# Patient Record
Sex: Male | Born: 1950
Health system: Southern US, Community
[De-identification: ages and names within clinical notes are randomized; demographics above are authoritative.]

## PROBLEM LIST (undated history)

## (undated) DIAGNOSIS — I499 Cardiac arrhythmia, unspecified: Secondary | ICD-10-CM

## (undated) DIAGNOSIS — R600 Localized edema: Secondary | ICD-10-CM

## (undated) DIAGNOSIS — Z9221 Personal history of antineoplastic chemotherapy: Secondary | ICD-10-CM

## (undated) DIAGNOSIS — I10 Essential (primary) hypertension: Secondary | ICD-10-CM

## (undated) DIAGNOSIS — Z720 Tobacco use: Secondary | ICD-10-CM

## (undated) DIAGNOSIS — C189 Malignant neoplasm of colon, unspecified: Secondary | ICD-10-CM

## (undated) DIAGNOSIS — I82409 Acute embolism and thrombosis of unspecified deep veins of unspecified lower extremity: Secondary | ICD-10-CM

## (undated) DIAGNOSIS — G709 Myoneural disorder, unspecified: Secondary | ICD-10-CM

## (undated) DIAGNOSIS — I480 Paroxysmal atrial fibrillation: Secondary | ICD-10-CM

## (undated) HISTORY — DX: Essential (primary) hypertension: I10

## (undated) HISTORY — DX: Acute embolism and thrombosis of unspecified deep veins of unspecified lower extremity: I82.409

## (undated) HISTORY — PX: TUNNELED VENOUS PORT PLACEMENT: SHX819

## (undated) HISTORY — PX: COLON SURGERY: SHX602

## (undated) HISTORY — PX: FRACTURE SURGERY: SHX138

## (undated) HISTORY — PX: PORT A CATH INJECTION (ARMC HX): HXRAD1731

## (undated) HISTORY — PX: LEG SURGERY: SHX1003

## (undated) HISTORY — DX: Malignant neoplasm of colon, unspecified: C18.9

## (undated) HISTORY — PX: FEMUR FRACTURE SURGERY: SHX633

---

## 2013-11-23 ENCOUNTER — Ambulatory Visit: Payer: Self-pay | Admitting: Oncology

## 2013-12-16 ENCOUNTER — Inpatient Hospital Stay: Payer: Self-pay | Admitting: Surgery

## 2013-12-16 LAB — COMPREHENSIVE METABOLIC PANEL
ANION GAP: 9 (ref 7–16)
Albumin: 3.8 g/dL (ref 3.4–5.0)
Alkaline Phosphatase: 81 U/L
BUN: 16 mg/dL (ref 7–18)
Bilirubin,Total: 1.4 mg/dL — ABNORMAL HIGH (ref 0.2–1.0)
CHLORIDE: 105 mmol/L (ref 98–107)
CO2: 25 mmol/L (ref 21–32)
Calcium, Total: 9.1 mg/dL (ref 8.5–10.1)
Creatinine: 1.47 mg/dL — ABNORMAL HIGH (ref 0.60–1.30)
GFR CALC NON AF AMER: 52 — AB
GLUCOSE: 123 mg/dL — AB (ref 65–99)
Osmolality: 280 (ref 275–301)
Potassium: 3.5 mmol/L (ref 3.5–5.1)
SGOT(AST): 20 U/L (ref 15–37)
SGPT (ALT): 24 U/L
SODIUM: 139 mmol/L (ref 136–145)
Total Protein: 8 g/dL (ref 6.4–8.2)

## 2013-12-16 LAB — URINALYSIS, COMPLETE
Bacteria: NONE SEEN
Bilirubin,UR: NEGATIVE
Glucose,UR: NEGATIVE mg/dL (ref 0–75)
LEUKOCYTE ESTERASE: NEGATIVE
NITRITE: NEGATIVE
Ph: 5 (ref 4.5–8.0)
Protein: NEGATIVE
RBC,UR: 2 /HPF (ref 0–5)
SQUAMOUS EPITHELIAL: NONE SEEN
Specific Gravity: 1.021 (ref 1.003–1.030)

## 2013-12-16 LAB — LIPASE, BLOOD: Lipase: 91 U/L (ref 73–393)

## 2013-12-16 LAB — CBC
HCT: 51.6 % (ref 40.0–52.0)
HGB: 16.9 g/dL (ref 13.0–18.0)
MCH: 28.9 pg (ref 26.0–34.0)
MCHC: 32.7 g/dL (ref 32.0–36.0)
MCV: 89 fL (ref 80–100)
PLATELETS: 262 10*3/uL (ref 150–440)
RBC: 5.83 10*6/uL (ref 4.40–5.90)
RDW: 14.4 % (ref 11.5–14.5)
WBC: 8.1 10*3/uL (ref 3.8–10.6)

## 2013-12-16 LAB — TROPONIN I: Troponin-I: 0.04 ng/mL

## 2013-12-17 LAB — BASIC METABOLIC PANEL
Anion Gap: 8 (ref 7–16)
BUN: 16 mg/dL (ref 7–18)
CALCIUM: 8.3 mg/dL — AB (ref 8.5–10.1)
CREATININE: 1.43 mg/dL — AB (ref 0.60–1.30)
Chloride: 102 mmol/L (ref 98–107)
Co2: 25 mmol/L (ref 21–32)
EGFR (African American): >60
GFR CALC NON AF AMER: 53 — AB
Glucose: 116 mg/dL — ABNORMAL HIGH (ref 65–99)
Osmolality: 272 (ref 275–301)
Potassium: 3.4 mmol/L — ABNORMAL LOW (ref 3.5–5.1)
Sodium: 135 mmol/L — ABNORMAL LOW (ref 136–145)

## 2013-12-17 LAB — CBC WITH DIFFERENTIAL/PLATELET
Basophil #: 0.1 10*3/uL (ref 0.0–0.1)
Basophil %: 0.6 %
EOS ABS: 0 10*3/uL (ref 0.0–0.7)
Eosinophil %: 0.3 %
HCT: 43.9 % (ref 40.0–52.0)
HGB: 14.5 g/dL (ref 13.0–18.0)
Lymphocyte #: 1.4 10*3/uL (ref 1.0–3.6)
Lymphocyte %: 16.4 %
MCH: 29 pg (ref 26.0–34.0)
MCHC: 33 g/dL (ref 32.0–36.0)
MCV: 88 fL (ref 80–100)
Monocyte #: 0.6 x10 3/mm (ref 0.2–1.0)
Monocyte %: 7.1 %
NEUTROS ABS: 6.6 10*3/uL — AB (ref 1.4–6.5)
Neutrophil %: 75.6 %
Platelet: 208 10*3/uL (ref 150–440)
RBC: 4.99 10*6/uL (ref 4.40–5.90)
RDW: 14.3 % (ref 11.5–14.5)
WBC: 8.7 10*3/uL (ref 3.8–10.6)

## 2013-12-17 LAB — CEA: CEA: 69.5 ng/mL — AB (ref 0.0–4.7)

## 2013-12-19 HISTORY — PX: COLON RESECTION: SHX5231

## 2013-12-19 LAB — CBC WITH DIFFERENTIAL/PLATELET
Basophil #: 0 10*3/uL (ref 0.0–0.1)
Basophil %: 0.4 %
Eosinophil #: 0.1 10*3/uL (ref 0.0–0.7)
Eosinophil %: 1.9 %
HCT: 39.4 % — ABNORMAL LOW (ref 40.0–52.0)
HGB: 13 g/dL (ref 13.0–18.0)
Lymphocyte #: 1.1 10*3/uL (ref 1.0–3.6)
Lymphocyte %: 21.3 %
MCH: 29.3 pg (ref 26.0–34.0)
MCHC: 33 g/dL (ref 32.0–36.0)
MCV: 89 fL (ref 80–100)
Monocyte #: 0.5 x10 3/mm (ref 0.2–1.0)
Monocyte %: 9.9 %
Neutrophil #: 3.5 10*3/uL (ref 1.4–6.5)
Neutrophil %: 66.5 %
Platelet: 168 10*3/uL (ref 150–440)
RBC: 4.43 10*6/uL (ref 4.40–5.90)
RDW: 14.6 % — ABNORMAL HIGH (ref 11.5–14.5)
WBC: 5.3 10*3/uL (ref 3.8–10.6)

## 2013-12-19 LAB — BASIC METABOLIC PANEL
Anion Gap: 6 — ABNORMAL LOW (ref 7–16)
BUN: 14 mg/dL (ref 7–18)
CO2: 25 mmol/L (ref 21–32)
CREATININE: 1.3 mg/dL (ref 0.60–1.30)
Calcium, Total: 8.5 mg/dL (ref 8.5–10.1)
Chloride: 105 mmol/L (ref 98–107)
EGFR (African American): >60
GFR CALC NON AF AMER: 59 — AB
GLUCOSE: 96 mg/dL (ref 65–99)
OSMOLALITY: 272 (ref 275–301)
Potassium: 3.8 mmol/L (ref 3.5–5.1)
Sodium: 136 mmol/L (ref 136–145)

## 2013-12-19 LAB — PROTIME-INR
INR: 1.2
Prothrombin Time: 14.7 secs (ref 11.5–14.7)

## 2013-12-20 LAB — CBC WITH DIFFERENTIAL/PLATELET
Basophil #: 0 10*3/uL (ref 0.0–0.1)
Basophil %: 0.5 %
Eosinophil #: 0 10*3/uL (ref 0.0–0.7)
Eosinophil %: 0.5 %
HCT: 42.6 % (ref 40.0–52.0)
HGB: 14.1 g/dL (ref 13.0–18.0)
LYMPHS ABS: 0.5 10*3/uL — AB (ref 1.0–3.6)
Lymphocyte %: 15.9 %
MCH: 28.8 pg (ref 26.0–34.0)
MCHC: 33 g/dL (ref 32.0–36.0)
MCV: 88 fL (ref 80–100)
MONO ABS: 0.4 x10 3/mm (ref 0.2–1.0)
MONOS PCT: 10.6 %
NEUTROS PCT: 72.5 %
Neutrophil #: 2.4 10*3/uL (ref 1.4–6.5)
Platelet: 179 10*3/uL (ref 150–440)
RBC: 4.87 10*6/uL (ref 4.40–5.90)
RDW: 14.7 % — ABNORMAL HIGH (ref 11.5–14.5)
WBC: 3.3 10*3/uL — ABNORMAL LOW (ref 3.8–10.6)

## 2013-12-20 LAB — COMPREHENSIVE METABOLIC PANEL
ALK PHOS: 57 U/L
Albumin: 2.3 g/dL — ABNORMAL LOW (ref 3.4–5.0)
Anion Gap: 8 (ref 7–16)
BILIRUBIN TOTAL: 2.7 mg/dL — AB (ref 0.2–1.0)
BUN: 20 mg/dL — AB (ref 7–18)
CHLORIDE: 105 mmol/L (ref 98–107)
Calcium, Total: 8.4 mg/dL — ABNORMAL LOW (ref 8.5–10.1)
Co2: 24 mmol/L (ref 21–32)
Creatinine: 1.57 mg/dL — ABNORMAL HIGH (ref 0.60–1.30)
EGFR (African American): 58 — ABNORMAL LOW
EGFR (Non-African Amer.): 48 — ABNORMAL LOW
Glucose: 117 mg/dL — ABNORMAL HIGH (ref 65–99)
Osmolality: 277 (ref 275–301)
Potassium: 4.1 mmol/L (ref 3.5–5.1)
SGOT(AST): 13 U/L — ABNORMAL LOW (ref 15–37)
SGPT (ALT): 17 U/L
Sodium: 137 mmol/L (ref 136–145)
Total Protein: 5.7 g/dL — ABNORMAL LOW (ref 6.4–8.2)

## 2013-12-21 LAB — BASIC METABOLIC PANEL
Anion Gap: 9 (ref 7–16)
BUN: 20 mg/dL — ABNORMAL HIGH (ref 7–18)
CALCIUM: 9.1 mg/dL (ref 8.5–10.1)
CHLORIDE: 104 mmol/L (ref 98–107)
CREATININE: 1.15 mg/dL (ref 0.60–1.30)
Co2: 29 mmol/L (ref 21–32)
EGFR (African American): >60
EGFR (Non-African Amer.): >60
GLUCOSE: 114 mg/dL — AB (ref 65–99)
Osmolality: 287 (ref 275–301)
POTASSIUM: 3.7 mmol/L (ref 3.5–5.1)
SODIUM: 142 mmol/L (ref 136–145)

## 2013-12-21 LAB — CBC WITH DIFFERENTIAL/PLATELET
BASOS ABS: 0 10*3/uL (ref 0.0–0.1)
Basophil %: 0.2 %
EOS ABS: 0 10*3/uL (ref 0.0–0.7)
Eosinophil %: 0.6 %
HCT: 42.9 % (ref 40.0–52.0)
HGB: 14 g/dL (ref 13.0–18.0)
LYMPHS ABS: 0.6 10*3/uL — AB (ref 1.0–3.6)
Lymphocyte %: 11.1 %
MCH: 28.5 pg (ref 26.0–34.0)
MCHC: 32.6 g/dL (ref 32.0–36.0)
MCV: 87 fL (ref 80–100)
Monocyte #: 0.5 x10 3/mm (ref 0.2–1.0)
Monocyte %: 9.6 %
Neutrophil #: 4.4 10*3/uL (ref 1.4–6.5)
Neutrophil %: 78.5 %
PLATELETS: 217 10*3/uL (ref 150–440)
RBC: 4.9 10*6/uL (ref 4.40–5.90)
RDW: 15 % — ABNORMAL HIGH (ref 11.5–14.5)
WBC: 5.6 10*3/uL (ref 3.8–10.6)

## 2013-12-22 LAB — BASIC METABOLIC PANEL
Anion Gap: 8 (ref 7–16)
BUN: 30 mg/dL — AB (ref 7–18)
CHLORIDE: 104 mmol/L (ref 98–107)
CO2: 34 mmol/L — AB (ref 21–32)
CREATININE: 1.38 mg/dL — AB (ref 0.60–1.30)
Calcium, Total: 8.8 mg/dL (ref 8.5–10.1)
EGFR (African American): >60
EGFR (Non-African Amer.): 56 — ABNORMAL LOW
GLUCOSE: 109 mg/dL — AB (ref 65–99)
OSMOLALITY: 297 (ref 275–301)
Potassium: 3.4 mmol/L — ABNORMAL LOW (ref 3.5–5.1)
SODIUM: 146 mmol/L — AB (ref 136–145)

## 2013-12-22 LAB — MAGNESIUM: MAGNESIUM: 2.7 mg/dL — AB

## 2013-12-23 ENCOUNTER — Ambulatory Visit: Payer: Self-pay | Admitting: Oncology

## 2013-12-23 LAB — CBC WITH DIFFERENTIAL/PLATELET
BASOS ABS: 0 10*3/uL (ref 0.0–0.1)
Basophil %: 0.3 %
EOS PCT: 2.4 %
Eosinophil #: 0.2 10*3/uL (ref 0.0–0.7)
HCT: 36.4 % — ABNORMAL LOW (ref 40.0–52.0)
HGB: 11.6 g/dL — ABNORMAL LOW (ref 13.0–18.0)
Lymphocyte #: 1.2 10*3/uL (ref 1.0–3.6)
Lymphocyte %: 17.4 %
MCH: 28.4 pg (ref 26.0–34.0)
MCHC: 31.8 g/dL — AB (ref 32.0–36.0)
MCV: 89 fL (ref 80–100)
MONO ABS: 0.6 x10 3/mm (ref 0.2–1.0)
Monocyte %: 8.7 %
NEUTROS ABS: 5.1 10*3/uL (ref 1.4–6.5)
Neutrophil %: 71.2 %
PLATELETS: 208 10*3/uL (ref 150–440)
RBC: 4.07 10*6/uL — AB (ref 4.40–5.90)
RDW: 14.8 % — ABNORMAL HIGH (ref 11.5–14.5)
WBC: 7.2 10*3/uL (ref 3.8–10.6)

## 2013-12-23 LAB — BASIC METABOLIC PANEL
Anion Gap: 7 (ref 7–16)
BUN: 30 mg/dL — AB (ref 7–18)
CHLORIDE: 104 mmol/L (ref 98–107)
CO2: 33 mmol/L — AB (ref 21–32)
Calcium, Total: 8.1 mg/dL — ABNORMAL LOW (ref 8.5–10.1)
Creatinine: 1.51 mg/dL — ABNORMAL HIGH (ref 0.60–1.30)
EGFR (Non-African Amer.): 50 — ABNORMAL LOW
Glucose: 124 mg/dL — ABNORMAL HIGH (ref 65–99)
Osmolality: 294 (ref 275–301)
Potassium: 3.3 mmol/L — ABNORMAL LOW (ref 3.5–5.1)
Sodium: 144 mmol/L (ref 136–145)

## 2013-12-23 LAB — MAGNESIUM: Magnesium: 2.6 mg/dL — ABNORMAL HIGH

## 2013-12-23 LAB — PHOSPHORUS: PHOSPHORUS: 2.9 mg/dL (ref 2.5–4.9)

## 2013-12-24 LAB — CBC WITH DIFFERENTIAL/PLATELET
Basophil #: 0 10*3/uL (ref 0.0–0.1)
Basophil %: 0.4 %
EOS ABS: 0.3 10*3/uL (ref 0.0–0.7)
Eosinophil %: 4.1 %
HCT: 36.2 % — AB (ref 40.0–52.0)
HGB: 11.7 g/dL — AB (ref 13.0–18.0)
LYMPHS PCT: 22.2 %
Lymphocyte #: 1.5 10*3/uL (ref 1.0–3.6)
MCH: 28.8 pg (ref 26.0–34.0)
MCHC: 32.3 g/dL (ref 32.0–36.0)
MCV: 89 fL (ref 80–100)
MONO ABS: 0.6 x10 3/mm (ref 0.2–1.0)
MONOS PCT: 9.7 %
NEUTROS ABS: 4.2 10*3/uL (ref 1.4–6.5)
Neutrophil %: 63.6 %
Platelet: 239 10*3/uL (ref 150–440)
RBC: 4.06 10*6/uL — ABNORMAL LOW (ref 4.40–5.90)
RDW: 15.1 % — AB (ref 11.5–14.5)
WBC: 6.6 10*3/uL (ref 3.8–10.6)

## 2013-12-24 LAB — BASIC METABOLIC PANEL
ANION GAP: 5 — AB (ref 7–16)
BUN: 23 mg/dL — ABNORMAL HIGH (ref 7–18)
CREATININE: 1.5 mg/dL — AB (ref 0.60–1.30)
Calcium, Total: 8.8 mg/dL (ref 8.5–10.1)
Chloride: 102 mmol/L (ref 98–107)
Co2: 32 mmol/L (ref 21–32)
EGFR (African American): >60
GFR CALC NON AF AMER: 50 — AB
Glucose: 104 mg/dL — ABNORMAL HIGH (ref 65–99)
Osmolality: 282 (ref 275–301)
Potassium: 3.2 mmol/L — ABNORMAL LOW (ref 3.5–5.1)
SODIUM: 139 mmol/L (ref 136–145)

## 2013-12-25 LAB — BASIC METABOLIC PANEL
ANION GAP: 8 (ref 7–16)
BUN: 20 mg/dL — ABNORMAL HIGH (ref 7–18)
CALCIUM: 8.5 mg/dL (ref 8.5–10.1)
CHLORIDE: 102 mmol/L (ref 98–107)
CO2: 28 mmol/L (ref 21–32)
CREATININE: 1.39 mg/dL — AB (ref 0.60–1.30)
EGFR (Non-African Amer.): 55 — ABNORMAL LOW
Glucose: 107 mg/dL — ABNORMAL HIGH (ref 65–99)
Osmolality: 279 (ref 275–301)
POTASSIUM: 3.1 mmol/L — AB (ref 3.5–5.1)
SODIUM: 138 mmol/L (ref 136–145)

## 2013-12-25 LAB — CLOSTRIDIUM DIFFICILE(ARMC)

## 2013-12-26 LAB — BASIC METABOLIC PANEL
Anion Gap: 11 (ref 7–16)
BUN: 19 mg/dL — ABNORMAL HIGH (ref 7–18)
CHLORIDE: 106 mmol/L (ref 98–107)
CO2: 22 mmol/L (ref 21–32)
CREATININE: 1.37 mg/dL — AB (ref 0.60–1.30)
Calcium, Total: 8.5 mg/dL (ref 8.5–10.1)
EGFR (Non-African Amer.): 56 — ABNORMAL LOW
Glucose: 101 mg/dL — ABNORMAL HIGH (ref 65–99)
OSMOLALITY: 280 (ref 275–301)
POTASSIUM: 3.9 mmol/L (ref 3.5–5.1)
SODIUM: 139 mmol/L (ref 136–145)

## 2014-01-01 ENCOUNTER — Ambulatory Visit: Payer: Self-pay | Admitting: Surgery

## 2014-01-07 ENCOUNTER — Inpatient Hospital Stay: Payer: Self-pay | Admitting: Surgery

## 2014-01-08 LAB — CREATININE, SERUM
Creatinine: 1.28 mg/dL (ref 0.60–1.30)
EGFR (African American): >60
EGFR (Non-African Amer.): >60

## 2014-01-09 ENCOUNTER — Ambulatory Visit: Payer: Self-pay | Admitting: Oncology

## 2014-01-14 LAB — PROTIME-INR
INR: 1.3
Prothrombin Time: 15.8 secs — ABNORMAL HIGH (ref 11.5–14.7)

## 2014-01-20 LAB — CREATININE, SERUM
Creatinine: 1.4 mg/dL — ABNORMAL HIGH (ref 0.60–1.30)
EGFR (African American): >60
EGFR (Non-African Amer.): 55 — ABNORMAL LOW

## 2014-01-20 LAB — PROTIME-INR
INR: 4.8
Prothrombin Time: 43.5 secs — ABNORMAL HIGH (ref 11.5–14.7)

## 2014-01-23 ENCOUNTER — Ambulatory Visit: Payer: Self-pay | Admitting: Oncology

## 2014-01-27 LAB — PROTIME-INR
INR: 3.9
PROTHROMBIN TIME: 36.8 s — AB (ref 11.5–14.7)

## 2014-01-30 DIAGNOSIS — I4891 Unspecified atrial fibrillation: Secondary | ICD-10-CM | POA: Insufficient documentation

## 2014-01-30 DIAGNOSIS — I10 Essential (primary) hypertension: Secondary | ICD-10-CM | POA: Insufficient documentation

## 2014-01-30 LAB — CBC CANCER CENTER
BASOS ABS: 0 x10 3/mm (ref 0.0–0.1)
Basophil %: 0.2 %
EOS PCT: 6.5 %
Eosinophil #: 0.3 x10 3/mm (ref 0.0–0.7)
HCT: 37.6 % — ABNORMAL LOW (ref 40.0–52.0)
HGB: 12.1 g/dL — ABNORMAL LOW (ref 13.0–18.0)
LYMPHS ABS: 2 x10 3/mm (ref 1.0–3.6)
Lymphocyte %: 43 %
MCH: 27.3 pg (ref 26.0–34.0)
MCHC: 32.3 g/dL (ref 32.0–36.0)
MCV: 85 fL (ref 80–100)
MONO ABS: 0.4 x10 3/mm (ref 0.2–1.0)
Monocyte %: 9.2 %
NEUTROS ABS: 1.9 x10 3/mm (ref 1.4–6.5)
NEUTROS PCT: 41.1 %
PLATELETS: 227 x10 3/mm (ref 150–440)
RBC: 4.44 10*6/uL (ref 4.40–5.90)
RDW: 15.7 % — ABNORMAL HIGH (ref 11.5–14.5)
WBC: 4.6 x10 3/mm (ref 3.8–10.6)

## 2014-01-30 LAB — COMPREHENSIVE METABOLIC PANEL
ALBUMIN: 3.5 g/dL (ref 3.4–5.0)
ALK PHOS: 81 U/L
ANION GAP: 7 (ref 7–16)
AST: 10 U/L — AB (ref 15–37)
BUN: 18 mg/dL (ref 7–18)
Bilirubin,Total: 1.1 mg/dL — ABNORMAL HIGH (ref 0.2–1.0)
CALCIUM: 8.9 mg/dL (ref 8.5–10.1)
CO2: 30 mmol/L (ref 21–32)
Chloride: 103 mmol/L (ref 98–107)
Creatinine: 1.22 mg/dL (ref 0.60–1.30)
EGFR (African American): >60
EGFR (Non-African Amer.): >60
Glucose: 89 mg/dL (ref 65–99)
Osmolality: 281 (ref 275–301)
POTASSIUM: 4.3 mmol/L (ref 3.5–5.1)
SGPT (ALT): 28 U/L
SODIUM: 140 mmol/L (ref 136–145)
Total Protein: 7.1 g/dL (ref 6.4–8.2)

## 2014-01-30 LAB — PROTIME-INR
INR: 1.3
PROTHROMBIN TIME: 15.7 s — AB (ref 11.5–14.7)

## 2014-02-02 DIAGNOSIS — R001 Bradycardia, unspecified: Secondary | ICD-10-CM | POA: Insufficient documentation

## 2014-02-02 LAB — CEA: CEA: 3.3 ng/mL (ref 0.0–4.7)

## 2014-02-05 ENCOUNTER — Ambulatory Visit: Payer: Self-pay | Admitting: Surgery

## 2014-02-05 LAB — PROTIME-INR
INR: 1.1
Prothrombin Time: 13.7 secs (ref 11.5–14.7)

## 2014-02-06 LAB — CBC CANCER CENTER
BASOS PCT: 1.3 %
Basophil #: 0.1 x10 3/mm (ref 0.0–0.1)
Eosinophil #: 0.3 x10 3/mm (ref 0.0–0.7)
Eosinophil %: 6.8 %
HCT: 37.3 % — AB (ref 40.0–52.0)
HGB: 12 g/dL — AB (ref 13.0–18.0)
LYMPHS PCT: 43.2 %
Lymphocyte #: 1.8 x10 3/mm (ref 1.0–3.6)
MCH: 27.2 pg (ref 26.0–34.0)
MCHC: 32.1 g/dL (ref 32.0–36.0)
MCV: 85 fL (ref 80–100)
MONOS PCT: 8.6 %
Monocyte #: 0.4 x10 3/mm (ref 0.2–1.0)
NEUTROS ABS: 1.7 x10 3/mm (ref 1.4–6.5)
NEUTROS PCT: 40.1 %
Platelet: 181 x10 3/mm (ref 150–440)
RBC: 4.4 10*6/uL (ref 4.40–5.90)
RDW: 15.4 % — ABNORMAL HIGH (ref 11.5–14.5)
WBC: 4.2 x10 3/mm (ref 3.8–10.6)

## 2014-02-06 LAB — COMPREHENSIVE METABOLIC PANEL
ANION GAP: 6 — AB (ref 7–16)
Albumin: 3.4 g/dL (ref 3.4–5.0)
Alkaline Phosphatase: 66 U/L
BILIRUBIN TOTAL: 1.2 mg/dL — AB (ref 0.2–1.0)
BUN: 11 mg/dL (ref 7–18)
CHLORIDE: 106 mmol/L (ref 98–107)
CREATININE: 1.23 mg/dL (ref 0.60–1.30)
Calcium, Total: 8.6 mg/dL (ref 8.5–10.1)
Co2: 31 mmol/L (ref 21–32)
EGFR (Non-African Amer.): >60
Glucose: 89 mg/dL (ref 65–99)
Osmolality: 284 (ref 275–301)
Potassium: 4.1 mmol/L (ref 3.5–5.1)
SGOT(AST): 9 U/L — ABNORMAL LOW (ref 15–37)
SGPT (ALT): 20 U/L
Sodium: 143 mmol/L (ref 136–145)
TOTAL PROTEIN: 6.7 g/dL (ref 6.4–8.2)

## 2014-02-10 LAB — CEA: CEA: 2.9 ng/mL (ref 0.0–4.7)

## 2014-02-12 LAB — PROTIME-INR
INR: 1.2
PROTHROMBIN TIME: 15 s — AB (ref 11.5–14.7)

## 2014-02-19 LAB — PROTIME-INR
INR: 1.9
PROTHROMBIN TIME: 21.1 s — AB (ref 11.5–14.7)

## 2014-02-19 LAB — CBC CANCER CENTER
BASOS ABS: 0 x10 3/mm (ref 0.0–0.1)
Basophil %: 0.9 %
EOS PCT: 5.5 %
Eosinophil #: 0.2 x10 3/mm (ref 0.0–0.7)
HCT: 36.2 % — ABNORMAL LOW (ref 40.0–52.0)
HGB: 12 g/dL — ABNORMAL LOW (ref 13.0–18.0)
Lymphocyte #: 1.5 x10 3/mm (ref 1.0–3.6)
Lymphocyte %: 43.9 %
MCH: 27.7 pg (ref 26.0–34.0)
MCHC: 33.2 g/dL (ref 32.0–36.0)
MCV: 84 fL (ref 80–100)
MONOS PCT: 9.7 %
Monocyte #: 0.3 x10 3/mm (ref 0.2–1.0)
NEUTROS PCT: 40 %
Neutrophil #: 1.4 x10 3/mm (ref 1.4–6.5)
PLATELETS: 195 x10 3/mm (ref 150–440)
RBC: 4.33 10*6/uL — AB (ref 4.40–5.90)
RDW: 16 % — AB (ref 11.5–14.5)
WBC: 3.5 x10 3/mm — AB (ref 3.8–10.6)

## 2014-02-19 LAB — BASIC METABOLIC PANEL
Anion Gap: 7 (ref 7–16)
BUN: 13 mg/dL (ref 7–18)
CALCIUM: 8.6 mg/dL (ref 8.5–10.1)
CREATININE: 1.11 mg/dL (ref 0.60–1.30)
Chloride: 107 mmol/L (ref 98–107)
Co2: 29 mmol/L (ref 21–32)
EGFR (Non-African Amer.): >60
Glucose: 101 mg/dL — ABNORMAL HIGH (ref 65–99)
Osmolality: 285 (ref 275–301)
Potassium: 3.7 mmol/L (ref 3.5–5.1)
SODIUM: 143 mmol/L (ref 136–145)

## 2014-02-23 ENCOUNTER — Ambulatory Visit: Payer: Self-pay | Admitting: Oncology

## 2014-02-24 LAB — COMPREHENSIVE METABOLIC PANEL
ALK PHOS: 66 U/L (ref 46–116)
Albumin: 3.5 g/dL (ref 3.4–5.0)
Anion Gap: 10 (ref 7–16)
BUN: 13 mg/dL (ref 7–18)
Bilirubin,Total: 0.5 mg/dL (ref 0.2–1.0)
CHLORIDE: 106 mmol/L (ref 98–107)
CREATININE: 1.23 mg/dL (ref 0.60–1.30)
Calcium, Total: 8.8 mg/dL (ref 8.5–10.1)
Co2: 26 mmol/L (ref 21–32)
EGFR (African American): >60
EGFR (Non-African Amer.): >60
Glucose: 133 mg/dL — ABNORMAL HIGH (ref 65–99)
Osmolality: 285 (ref 275–301)
Potassium: 3.6 mmol/L (ref 3.5–5.1)
SGOT(AST): 14 U/L — ABNORMAL LOW (ref 15–37)
SGPT (ALT): 23 U/L (ref 14–63)
Sodium: 142 mmol/L (ref 136–145)
Total Protein: 6.6 g/dL (ref 6.4–8.2)

## 2014-02-24 LAB — CBC CANCER CENTER
Basophil #: 0 x10 3/mm (ref 0.0–0.1)
Basophil %: 0.9 %
EOS PCT: 5 %
Eosinophil #: 0.1 x10 3/mm (ref 0.0–0.7)
HCT: 37.1 % — AB (ref 40.0–52.0)
HGB: 12.3 g/dL — AB (ref 13.0–18.0)
Lymphocyte #: 1.5 x10 3/mm (ref 1.0–3.6)
Lymphocyte %: 51.2 %
MCH: 27.7 pg (ref 26.0–34.0)
MCHC: 33 g/dL (ref 32.0–36.0)
MCV: 84 fL (ref 80–100)
MONOS PCT: 12.6 %
Monocyte #: 0.4 x10 3/mm (ref 0.2–1.0)
Neutrophil #: 0.9 x10 3/mm — ABNORMAL LOW (ref 1.4–6.5)
Neutrophil %: 30.3 %
Platelet: 183 x10 3/mm (ref 150–440)
RBC: 4.43 10*6/uL (ref 4.40–5.90)
RDW: 16.1 % — ABNORMAL HIGH (ref 11.5–14.5)
WBC: 2.9 x10 3/mm — AB (ref 3.8–10.6)

## 2014-02-24 LAB — PROTIME-INR
INR: 2.7
Prothrombin Time: 28.7 secs — ABNORMAL HIGH

## 2014-03-24 ENCOUNTER — Ambulatory Visit
Admit: 2014-03-24 | Disposition: A | Discharge status: Home / Self Care | Payer: Self-pay | Attending: Oncology | Admitting: Oncology

## 2014-04-14 LAB — CREATININE, SERUM: CREATINE, SERUM: 1.21

## 2014-04-21 LAB — PROTIME-INR
INR: 1.8
Prothrombin Time: 20.8 secs — ABNORMAL HIGH

## 2014-04-24 ENCOUNTER — Ambulatory Visit
Admit: 2014-04-24 | Disposition: A | Discharge status: Home / Self Care | Payer: Self-pay | Attending: Oncology | Admitting: Oncology

## 2014-04-25 DIAGNOSIS — I82409 Acute embolism and thrombosis of unspecified deep veins of unspecified lower extremity: Secondary | ICD-10-CM | POA: Insufficient documentation

## 2014-04-28 LAB — COMPREHENSIVE METABOLIC PANEL
ALBUMIN: 3.8 g/dL
ALK PHOS: 104 U/L
ALT: 27 U/L
AST: 27 U/L
Anion Gap: 7 (ref 7–16)
BUN: 25 mg/dL — AB
Bilirubin,Total: 0.7 mg/dL
CHLORIDE: 105 mmol/L
Calcium, Total: 8.9 mg/dL
Co2: 26 mmol/L
Creatinine: 1.26 mg/dL — ABNORMAL HIGH
EGFR (African American): >60
EGFR (Non-African Amer.): >60
Glucose: 137 mg/dL — ABNORMAL HIGH
POTASSIUM: 3.1 mmol/L — AB
Sodium: 138 mmol/L
TOTAL PROTEIN: 6.8 g/dL

## 2014-04-28 LAB — CBC CANCER CENTER
BASOS ABS: 0.1 x10 3/mm (ref 0.0–0.1)
Basophil %: 0.6 %
EOS ABS: 0.2 x10 3/mm (ref 0.0–0.7)
Eosinophil %: 1.7 %
HCT: 38.1 % — ABNORMAL LOW (ref 40.0–52.0)
HGB: 12.5 g/dL — AB (ref 13.0–18.0)
Lymphocyte #: 1.7 x10 3/mm (ref 1.0–3.6)
Lymphocyte %: 17.1 %
MCH: 27.9 pg (ref 26.0–34.0)
MCHC: 32.7 g/dL (ref 32.0–36.0)
MCV: 85 fL (ref 80–100)
Monocyte #: 0.7 x10 3/mm (ref 0.2–1.0)
Monocyte %: 6.8 %
NEUTROS PCT: 73.8 %
Neutrophil #: 7.3 x10 3/mm — ABNORMAL HIGH (ref 1.4–6.5)
Platelet: 124 x10 3/mm — ABNORMAL LOW (ref 150–440)
RBC: 4.47 10*6/uL (ref 4.40–5.90)
RDW: 19.5 % — ABNORMAL HIGH (ref 11.5–14.5)
WBC: 9.9 x10 3/mm (ref 3.8–10.6)

## 2014-04-28 LAB — PROTIME-INR
INR: 2.4
Prothrombin Time: 26.2 secs — ABNORMAL HIGH

## 2014-05-04 ENCOUNTER — Other Ambulatory Visit: Payer: Self-pay | Admitting: Oncology

## 2014-05-05 LAB — PROTIME-INR
INR: 2.1
PROTHROMBIN TIME: 23.4 s — AB

## 2014-05-12 LAB — CBC CANCER CENTER
BASOS ABS: 0.1 x10 3/mm (ref 0.0–0.1)
Basophil %: 0.8 %
Eosinophil #: 0.1 x10 3/mm (ref 0.0–0.7)
Eosinophil %: 1.8 %
HCT: 41 % (ref 40.0–52.0)
HGB: 13.4 g/dL (ref 13.0–18.0)
Lymphocyte #: 1.8 x10 3/mm (ref 1.0–3.6)
Lymphocyte %: 27.6 %
MCH: 28.1 pg (ref 26.0–34.0)
MCHC: 32.6 g/dL (ref 32.0–36.0)
MCV: 86 fL (ref 80–100)
MONOS PCT: 7.6 %
Monocyte #: 0.5 x10 3/mm (ref 0.2–1.0)
NEUTROS PCT: 62.2 %
Neutrophil #: 4.2 x10 3/mm (ref 1.4–6.5)
PLATELETS: 148 x10 3/mm — AB (ref 150–440)
RBC: 4.76 10*6/uL (ref 4.40–5.90)
RDW: 19.8 % — ABNORMAL HIGH (ref 11.5–14.5)
WBC: 6.7 x10 3/mm (ref 3.8–10.6)

## 2014-05-12 LAB — COMPREHENSIVE METABOLIC PANEL
ALT: 24 U/L
Albumin: 4.1 g/dL
Alkaline Phosphatase: 94 U/L
Anion Gap: 8 (ref 7–16)
BUN: 18 mg/dL
Bilirubin,Total: 0.6 mg/dL
CALCIUM: 9.2 mg/dL
CO2: 26 mmol/L
Chloride: 105 mmol/L
Creatinine: 1.58 mg/dL — ABNORMAL HIGH
GFR CALC AF AMER: 54 — AB
GFR CALC NON AF AMER: 46 — AB
Glucose: 143 mg/dL — ABNORMAL HIGH
Potassium: 3.3 mmol/L — ABNORMAL LOW
SGOT(AST): 29 U/L
SODIUM: 139 mmol/L
TOTAL PROTEIN: 7.2 g/dL

## 2014-05-14 LAB — PROTIME-INR
INR: 5.8 — AB
Prothrombin Time: 51.6 secs — ABNORMAL HIGH

## 2014-05-16 NOTE — H&P (Signed)
   Subjective/Chief Complaint RLE pain, U/S showing RLE DVT   History of Present Illness James Keller is a pleasant 64 yo M with a history of newly diagnosed colon cancer, admit with RLE pain, U/S shows femoral vein obstruction by thrombus.  Otherwise doing well.  No acute issues.   Past History Colon cancer s/p colectomy Atrial fibrillation   Code Status Full Code   Past Med/Surgical Hx:  Colostomy:   Stage III colon cancer:   denies:   ALLERGIES:  No Known Allergies:   Family and Social History:  Family History Non-Contributory   Social History negative tobacco, negative ETOH   Review of Systems:  Subjective/Chief Complaint RLE pain   Fever/Chills No   Cough No   Sputum No   Abdominal Pain No   Diarrhea No   Constipation No   Nausea/Vomiting No   SOB/DOE No   Dysuria No   Tolerating Diet Yes   Physical Exam:  GEN well developed, well nourished, no acute distress   HEENT pink conjunctivae, PERRL, hearing intact to voice, good dentition   NECK No masses   RESP normal resp effort  clear BS  no use of accessory muscles   CARD regular rate  no murmur  no thrills   VASCULAR ACCESS LE without swelling   ABD denies tenderness  no hernia  soft  rigid   EXTR negative cyanosis/clubbing, negative edema, RLE tender to palpation medially.   SKIN normal to palpation, skin turgor good   NEURO cranial nerves intact, negative Babinski R/L, negative rigidity, negative tremor, follows commands, cog wheel, strength:, motor/sensory function intact   PSYCH A+O to time, place, person, good insight    Assessment/Admission Diagnosis Lower extremity DVT, femoral and popliteal.   Plan Admit, begin anticoagulation.  Consult oncologist to assist with management.   Electronic Signatures: Floyde Parkins (MD)  (Signed 16-Dec-15 13:12)  Authored: CHIEF COMPLAINT and HISTORY, PAST MEDICAL/SURGIAL HISTORY, ALLERGIES, FAMILY AND SOCIAL HISTORY, REVIEW OF SYSTEMS,  PHYSICAL EXAM, ASSESSMENT AND PLAN   Last Updated: 16-Dec-15 13:12 by Floyde Parkins (MD)

## 2014-05-16 NOTE — H&P (Signed)
PATIENT NAME:  James Keller, BRUNTON MR#:  [id number] DATE OF BIRTH:  05-14-1950  DATE OF ADMISSION:  12/16/2013  PRIMARY CARE PROVIDER: None.   EMERGENCY DEPARTMENT REFERRING PHYSICIAN: Verdia Kuba. Paduchowski, MD.    CHIEF COMPLAINT: Abdominal pain, nausea, vomiting.   HISTORY OF PRESENT ILLNESS: The patient is a 64 year old African American male with no known medical history who has not been to a doctor, presents with lower abdominal quadrant pain, sharp in nature, ongoing since last Saturday. He also has had nausea. He has also been having nausea and vomiting, has not had any bowel movements. He came to the ED and was noted to have blood pressure in the 230s. Also had a CT scan of the abdomen and pelvis without contrast that showed questionable obstructing mid-descending colonic mass with fluid-filled and dilated proximal colon. Adjacent lymph nodes were noted as well. The patient reports that he might have lost weight, but he is not sure how much weight he has lost. He reports last full bowel movement was Saturday. He has not had any fevers. Has had a dry cough ongoing for a long time. Denies any other symptoms including urinary frequency, urgency or hesitancy.   PAST MEDICAL HISTORY: None.   PAST SURGICAL HISTORY: Status post left femur fracture repair.   ALLERGIES: None.   MEDICATIONS: Tylenol 650 q. 4 p.r.n.   SOCIAL HISTORY: Smokes less than 1 pack per day, drinks a 6 pack of beer a week. Uses marijuana from time to time.   FAMILY HISTORY: Grandmother had some sort of liver cancer, unable to give me details on this.   REVIEW OF SYSTEMS: CONSTITUTIONAL: Denies any fevers. Complains of fatigue and weakness. Some weight loss; he is not sure how much.  EYES: No blurred or double vision. No redness. No inflammation. No glaucoma. No cataracts.  ENT: No tinnitus. No ear pain. No hearing loss. No seasonal or year-round allergies. No epistaxis. No difficulty swallowing.  RESPIRATORY: Complains of  cough, but no wheezing. No hemoptysis. No COPD. No TB.  CARDIOVASCULAR: Denies any chest pain, orthopnea, edema, or arrhythmia, palpitations, or syncope.  GASTROINTESTINAL: Complains of nausea, vomiting, lower abdominal pain. No hematemesis or melena. No IBS. No jaundice. No rectal bleeding.  GENITOURINARY: Denies any dysuria, hematuria, renal colic or frequency.  ENDOCRINE: Denies any polyuria, nocturia, thyroid problems.  HEMATOLOGIC AND LYMPHATIC: Denies anemia, easy bruisability or bleeding.  SKIN: No acne. No rash.  MUSCULOSKELETAL: Denies any pain in the neck, back or shoulder. No gout.  NEUROLOGIC: No numbness, CVA, TIA or seizures.  PSYCHIATRIC: No anxiety, insomnia, or ADD.   PHYSICAL EXAMINATION: VITAL SIGNS: Temperature 97.3, pulse 100, respirations 19, blood pressure 209/130.  GENERAL: The patient is a well-developed male, mildly uncomfortable.  HEENT: Head atraumatic, normocephalic. Pupils equally round, reactive to light and accommodation. There is no conjunctival pallor. No sclerae icterus. Nasal exam shows no drainage or ulceration. Oropharynx is dry. Ears, no erythema or drainage.    NECK: Supple without any thyromegaly.  CARDIOVASCULAR: Regular rate and rhythm. No murmurs, rubs, clicks, or gallops.  LUNGS: Clear to auscultation bilaterally without any rales, rhonchi, wheezing.  ABDOMEN: Soft, mild distention. Positive diminished bowel sounds. No guarding. No rebound.  EXTREMITIES: No clubbing, cyanosis, or edema.  SKIN: No rash.  LYMPH NODES: Nonpalpable.  VASCULAR: Good DP, PT pulses.  PSYCHIATRIC: Not anxious or depressed.  NEUROLOGICAL: Awake, alert, oriented x 3. No focal deficits.  MUSCULOSKELETAL: There is no erythema or swelling.   EVALUATIONS: CT scan of the  abdomen shows questionable obstructing middescending colonic mass with a fluid-filled and dilated proximal colon. There are small adjacent lymph nodes, coronary artery calcification, right adrenal adenoma,  small infrarenal aortic aneurysm. Glucose 123, BUN 16, creatinine 1.47, sodium 139, potassium 3.5, chloride 105, CO2 of 25. LFTs are normal except bilirubin total 1.4. Troponin 0.04. WBC 8.1, hemoglobin 16.9, platelet count 262,000. EKG shows nonspecific T wave inversions in the lateral leads. Normal sinus rhythm.   ASSESSMENT AND PLAN: The patient is a 64 year old African American male with no medical history except nicotine addiction presents with abdominal pain, nausea, vomiting, accelerated hypertension.  1.  Abdominal pain due to colonic mass. Emergency Department physician spoke to Dr. Rexene Edison who recommends colonoscopy first, gastrointestinal consult, surgery consult, supportive care. Clear liquid diet for now. We will check a stat CA level.  2.  Accelerated hypertension. Due to the patient's ongoing gastrointestinal issues, probably will not be able to absorb p.o. medications. At this time, I will start him on a nitroglycerin patch, put him on scheduled hydralazine, as well as clonidine patch. Try to control his blood pressure.  3.  Nicotine addiction. Smoking cessation done, 4 minutes spent. Nicotine patch will be started. The patient recommended strongly to stop smoking.   TIME SPENT: 50 minutes.    ____________________________ [name], MD shp:at D: 12/16/2013 13:49:28 ET T: 12/16/2013 15:06:06 ET JOB#: [id number]  cc: Joda Braatz H. Posey Pronto, MD, <Dictator> Alric Seton MD ELECTRONICALLY SIGNED 12/21/2013 13:41

## 2014-05-16 NOTE — Consult Note (Signed)
PATIENT NAME:  James Keller, James Keller MR#:  [id number] DATE OF BIRTH:  [birth year]  DATE OF CONSULTATION:  12/16/2013  REFERRING PHYSICIAN:   CONSULTING PHYSICIAN:  Arther Dames, MD  REFERRING PHYSICIAN:  Dr. Dustin Flock.    REASON FOR CONSULTATION: Colonic obstruction, possible colon mass.   HISTORY OF PRESENT ILLNESS: James Keller is a 64 year old male with no significant past medical history, no recent contact with healthcare, who is presenting for evaluation of abdominal pain, nausea, and vomiting. James Keller repeats he was in his relatively usual state of health until this past Saturday, which would have been 4 days prior to today. At that time he developed periumbilical and left lower quadrant cramping abdominal pain. Since that time he also not passed any stool. He is passing small minimal amounts of flatus. He also has had some intermittent episodes of nausea and vomiting. He denies any fevers, though he is having some chills.   In the Emergency Room he did have a CT scan which shows a colonic obstruction and possible mass in the descending colon with upstream dilation.   He has never had a colonoscopy prior to this. He does not have any family history of colon cancer although he does have a father who they believe had liver cancer.   PAST MEDICAL HISTORY:  None.   ALLERGIES: NKDA.   MEDICATIONS:  None.    SOCIAL HISTORY: He is an ongoing smoker. He does drink about 1 beer daily.    FAMILY HISTORY:  He does have a mother with liver cancer.    REVIEW OF SYSTEMS:   CONSTITUTIONAL: No weight gain or weight loss.  No fever or chills. HEENT: No oral lesions or sore throat. No vision changes. GASTROINTESTINAL: See HPI.  HEME/LYMPH: No easy bruising or bleeding. CARDIOVASCULAR: No chest pain or dyspnea on exertion. GENITOURINARY: No hematuria. INTEGUMENTARY: No rashes or pruritus PSYCHIATRIC: No depression/anxiety.  ENDOCRINE: No heat/cold intolerance, no hair loss or skin  changes. ALLERGIC/IMMUNOLOGIC: Negative for hives. RESPIRATORY: No cough, no shortness of breath.  MUSCULOSKELETAL: No joint swelling or muscle pain.  PHYSICAL EXAMINATION:  VITAL SIGNS: Temperature is 98.4, pulse is 89, blood pressure is 161/96, pulse oximetry is 95% on room air.  GENERAL: Alert and oriented times 4.  No acute distress. Appears stated age. HEENT: Normocephalic/atraumatic. Extraocular movements are intact. Anicteric. NECK: Soft, supple. JVP appears normal. No adenopathy. CHEST: Clear to auscultation. No wheeze or crackle. Respirations unlabored. HEART: Regular. No murmur, rub, or gallop.  Normal S1 and S2. ABDOMEN: Soft.  Tenderness to palpation in the left lower quadrant and periumbilical region.  Positive for distention.  Normal active bowel sounds in all four quadrants.  No organomegaly. No masses.   EXTREMITIES: No swelling, well perfused. SKIN: No rash or lesion. Skin color, texture, turgor normal. NEUROLOGICAL: Grossly intact. PSYCHIATRIC: Normal tone and affect. MUSCULOSKELETAL: No joint swelling or erythema.   DATA: Basic metabolic panel is normal except for a creatinine of 1.47 and a glucose of 123. Liver enzymes are notable for a total bilirubin of 1.4, otherwise normal. Albumin is 3.8. Troponin normal. White count 8.1, hemoglobin 17, platelets are 262,000.  CT scan: See HPI, descending colon transition point of likely descending colon mass with dilated proximal to this descending colon.   ASSESSMENT: Colonic obstruction in the descending colon with upstream dilation, likely colonic mass: The most likely etiology here would be a colonic malignancy.   RECOMMENDATIONS:  1.  We will plan for flexible sigmoidoscopy tomorrow. We will perform 1  Fleet enema prep tonight and 2 Fleet enemas prep prior to the procedure. Flexible sigmoidoscopy is being chosen due to the very unlikely probability of being able to prep him from above for a colonoscopy. 2.  Okay to continue  clear liquids for now , but n.p.o. after midnight for moderate sedation. 3.  Further recommendations pending above findings.   Thank you for this consult    ____________________________ Arther Dames, MD mr:bu D: 12/16/2013 17:15:21 ET T: 12/16/2013 17:34:43 ET JOB#: [id number]  cc: Arther Dames, MD, <Dictator> Mellody Life MD ELECTRONICALLY SIGNED 12/23/2013 13:05

## 2014-05-16 NOTE — Consult Note (Signed)
PATIENT NAME:  James Keller, James Keller MR#:  [id number] DATE OF BIRTH:  [birth year]  DATE OF CONSULTATION:  12/22/2013  REFERRING PHYSICIAN:  Tana Conch. Leslye Peer, MD CONSULTING PHYSICIAN:  Corey Skains, MD  REASON FOR CONSULTATION: Acute nonvalvular atrial fibrillation and hypertension.   CHIEF COMPLAINT: "I have belly pain."   HISTORY OF PRESENT ILLNESS: This is a 64 year old male with no evidence of significant cardiovascular disease in the past with essential hypertension on appropriate medication management who has had a recent abdominal mass and abdominal ascites for which he was seen by CT scan and further evaluated and had had surgical resection. The patient has had some mild bowel obstruction, which is slowly improving after surgery, but after surgery, having some abdominal discomfort and pain, but slowly resolving. He had new onset of atrial fibrillation with rapid ventricular rate and no history of cardiovascular disease with, currently, no evidence of angina or congestive heart failure. The patient has tolerated the atrial fibrillation relatively well and was placed on diltiazem drip due to concerns of recent surgical intervention.  The remainder review of systems is negative for vision change, ringing in the ears, hearing loss, cough, congestion, leg weakness, cramping of the buttocks, known blood clots, headaches, blackouts, dizzy spells, nosebleeds, congestion, trouble swallowing, frequent urination, urination at night, muscle weakness, numbness, anxiety, depression, skin lesions, or skin rashes.   PAST MEDICAL HISTORY:  1.  Hypertension.  2.  Abdominal discomfort.   FAMILY HISTORY: No family members with early onset of cardiovascular disease or hypertension.   SOCIAL HISTORY: Currently denies alcohol or tobacco use.   ALLERGIES: As listed.   MEDICATIONS: As listed.   PHYSICAL EXAMINATION:  VITAL SIGNS: Blood pressure is 110/68 bilaterally. Heart rate is 130 upright, reclining,  and irregular.  GENERAL: He is a well-appearing male in no acute distress.  HEENT: No icterus, thyromegaly, ulcers, hemorrhage, or xanthelasma.  CARDIOVASCULAR: Irregularly irregular. Normal S1, S2. No apparent murmur, gallop, or rub. PMI is inferiorly displaced. Carotid upstroke normal without bruit. Jugular venous pressure is normal.  LUNGS: Have a few basilar crackles with normal respirations.  ABDOMEN: Soft with diffuse tenderness and positive bowel sounds.  EXTREMITIES: 2+ radial, femoral, dorsal pedal pulses with trace lower extremity edema. No cyanosis, clubbing, or ulcers.  NEUROLOGIC: He is oriented to time, place, and person, with normal mood and affect.   ASSESSMENT: This is a 64 year old male with essential hypertension and new onset nonvalvular atrial fibrillation with rapid ventricular rate and no current evidence of myocardial infarction or heart failure needing further treatment options.   RECOMMENDATIONS:  1.  Continue supportive care of recent surgery and recovery with hydration. 2.  Diltiazem drip intravenously for heart rate control to approximately 110 beats per minute with addition of oral medications after patient has further recovery of bowel and able to take orally.  3.  No anticoagulation full-strength due to concerns of bleeding complications with recent surgery, but deep venous thrombosis treatment until further recovery occurs.  4.  Begin ambulation as well, as patient wishes, without restriction.  5.  Echocardiogram for LV systolic dysfunction, valvular heart disease contributing to above.  6.  Further treatment options after above.    ____________________________ Corey Skains, MD bjk:ah D: 12/22/2013 12:31:44 ET T: 12/22/2013 12:54:55 ET JOB#: [id number]  cc: Corey Skains, MD, <Dictator> Corey Skains MD ELECTRONICALLY SIGNED 12/26/2013 9:03

## 2014-05-16 NOTE — Op Note (Signed)
PATIENT NAME:  James Keller, James Keller MR#:  [id number] DATE OF BIRTH:  16-Mar-1950  DATE OF PROCEDURE:  12/19/2013  PREOPERATIVE DIAGNOSIS:  Left colon mass and large bowel obstruction.  POSTOPERATIVE DIAGNOSES:   1.  Left colon mass, large bowel obstruction. 2.  Small microperforation in cecum with significant distention.  SURGEON:  Saveon Plant A. Simar Pothier, MD.  ANESTHESIA:  General.  ESTIMATED BLOOD LOSS:  250 mL.  COMPLICATIONS:  None.  SPECIMENS:  Left colon.  INDICATIONS FOR SURGERY:  James Keller is a pleasant 64 year old who presented with abdominal pain, nausea, and vomiting.  He was found to have an apple core lesion, mostly obstructing lesion in his left colon.  He was brought to the operating room for definitive treatment again for bowel obstruction which resulted.  DETAILS OF PROCEDURE:  Informed consent was obtained.  James Keller was brought to the operating room suite where he was induced.  Endotracheal tube was placed.  General anesthesia was administered.  His abdomen was prepped and draped in standard surgical fashion and a timeout was then performed correctly identifying the patient's name, operative site and procedure performed.  A midline incision was made in and deepened to the fascia. The fascia was incised.  The peritoneum was entered.  There was a large amount of dilated small bowel, as well as significant dilation of his large bowel, proximal to this lesion.  The lesion appeared to be in the left colon up by the flexure.  This was with some difficulty, dissected out distal to the middle colic.  The bowel was transected with GIA 75 stapler, then was transected on the left side of the GIA 75.  Ligation was performed using a combination of Harmonic scalpel and clamps with suture ligature.  The specimen was then sent.  Incision was then made to create an ostomy.  The right colon was looked at and noticed a small area of necrosis with some green stool.  This area was debrided  and sewn shut in 2 layers of 3-0 silk in the inner layer to close the mucosa and an outer imbricated 3-0 silk suture. The bowel was significantly dilated, but no obvious other areas of necrosis were noted.  I then brought up a distal transverse colonic left ostomy.  I did not want to put him back together based on the dilation of his bowel and his obstructive symptoms and I am not able to perform a prep.  The ostomy was then brought up through a skin incision, a fascial incision in the left upper quadrant.  The wound was then irrigated with multiple liters of warm normal saline.  The skin was then closed using a looped #1 PDS right upper most directions and tied with umbilical staples and then placed prior to skin closure. A separate clean tray was used.  Dressing was then placed over the wound.  The ostomy was then matured with 3-0 Monocryl suture and a sterile appliance was placed over the ostomy.  The patient was then awoken, extubated and brought to the post-anesthesia care unit.  There were no immediate complications.  Needle, sponge and instrument count were correct at the end of the procedure.      ____________________________ Glena Norfolk. Kendelle Schweers, MD cal:DT D: 12/20/2013 11:03:00 ET T: 12/20/2013 12:31:10 ET JOB#: [id number]  cc: Harrell Gave A. Shigeko Manard, MD, <Dictator> Floyde Parkins MD ELECTRONICALLY SIGNED 12/29/2013 20:12

## 2014-05-16 NOTE — Consult Note (Signed)
Brief Consult Note: Diagnosis: desc colon obstruction, likely mass.   Patient was seen by consultant.   Consult note dictated.   Recommend to proceed with surgery or procedure.   Comments: -Cannot do colon prep due to obstruction.   -Will try flex sig with enema prep and hopefully be able to reach site in desc colon for biopsy.  -One fleet enema tonight and two fleets enema in am prior to procedure.  Electronic Signatures: Arther Dames (MD)  (Signed [phone] 17:23)  Authored: Brief Consult Note   Last Updated: 24-Nov-15 17:23 by Arther Dames (MD)

## 2014-05-16 NOTE — Consult Note (Signed)
PATIENT NAME:  James Keller, SCHAUF MR#:  [id number] DATE OF BIRTH:  09-05-1950  Discharge Summary:  12/28/2013  CONSULTING PHYSICIAN:  Elta Guadeloupe A. Marina Gravel, MD  FINAL DIAGNOSES: 1.  Obstructing colon cancer.  2.  Atrial fibrillation, resolved.  PRINCIPLE PROCEDURES: 1.  Laparotomy with sigmoid colectomy, repair of right colonic perforation and end colostomy by Dr. Rexene Edison.  2.  GI medicine consultation. Dr Rayann Heman 3.  Medicine consultation. 4.  CT scan of the abdomen and pelvis. 5.  Hematology/oncology consultation.   Dr Grayland Ormond 6.  Cardiology consultation. Dr Nehemiah Massed.  HOSPITAL COURSE SUMMARY: The patient was admitted with nausea, vomiting and abdominal pain to the medicine service on the 24th of November. It became obvious that the patient had a distal colonic obstruction. He was taken to the Operating Room by Dr. Rexene Edison on the 27th, at which point a large obstructing colon mass was encountered. Colostomy was constructed. His postoperative course was complicated only by the fact of rapid ventricular response, atrial fibrillation. He was placed in the Intensive Care Unit and placed on a Cardizem drip. He eventually converted. Dr. Nehemiah Massed did become involved in his care and assisted in medical management of his atrial fibrillation and no anticoagulation was administered. He was started on oral metoprolol and Cardizem. Nasogastric tube was able to be removed. Ostomy did begin functioning. C. difficile was negative. He was taught ostomy care and had adequate pain control.   PLAN: For followup as an outpatient in both the oncology and surgery offices with interval followup with Dr. Alveria Apley office. By the 6th of December, the patient was tolerating a regular diet and had adequate pain control. His wound was healing nicely and he was deemed suitable and stable for discharge.   DISCHARGE MEDICATIONS: Metoprolol 25 mg by mouth every 8 hours, diltiazem 60 mg by mouth every 6 hours, Norco 5/325 one tab  every 6 hours as needed for pain, and Mylanta as needed for reflux. He will follow up in our office in the [place] location on December 10. Call with any questions or concerns.    ____________________________ Jeannette How Marina Gravel, MD mab:sw D: 12/28/2013 10:41:05 ET T: 12/28/2013 11:36:57 ET JOB#: [id number]  cc: Elta Guadeloupe A. Marina Gravel, MD, <Dictator> Hortencia Conradi MD ELECTRONICALLY SIGNED 12/28/2013 18:48

## 2014-05-16 NOTE — Consult Note (Signed)
Patient unavailable for evaluation this morning secondary to partial colectomy and colostomy surgery. Will follow patient while he is in the hospital and arrange close follow-up in the [place] upon discharge to discuss his pathology results and any adjuvant treatment necessary. Prior to discharge, patient will likely require a CT of the abdomen and pelvis with contrast to complete his staging workup. consult, will follow.  Electronic Signatures: Delight Hoh (MD)  (Signed on 27-Nov-15 10:42)  Authored  Last Updated: 27-Nov-15 10:42 by Delight Hoh (MD)

## 2014-05-16 NOTE — Consult Note (Signed)
Note Type Consult   Subjective: Chief Complaint/Diagnosis:   Stage IIIB colon cancer, now with DVT HPI:   Patient recently diagnosed with the above stated colon cancer, now presents with right-sided leg pain and found to have extensive DVT. Currently other than his pain he feels well. He denies any fevers. He has no neurologic complaints. He has no chest pain or shortness of breath. He denies any weight loss. He denies any easy bleeding or bruising. He has no nausea, vomiting, constipation, or diarrhea. Patient otherwise feels well and offers no further specific complaints.   Review of Systems:  Performance Status (ECOG): 1  Review of Systems:   As per HPI. Otherwise, 10 point system review was negative.   Allergies:  No Known Allergies:   PFSH: Additional Past Medical and Surgical History: negative.    Family history: Grandmother with "liver cancer".    Social history: Positive tobacco, less than one pack per day. Moderate alcohol use. Occasional marijuana.   Home Medications: Medication Instructions Last Modified Date/Time  rivaroxaban 15 mg oral tablet 1 tab(s) orally 2 times a day 17-Dec-15 14:03  acetaminophen-HYDROcodone 325 mg-5 mg oral tablet 1 tab(s) orally every 6 hours, As Needed, moderate pain (4-6/10) , As needed, moderate pain (4-6/10) 06-Dec-15 10:09  Mylanta Maximum Strength oral suspension 30 milliliter(s) orally every 6 hours, As Needed, dyspepsia , As needed, dyspepsia 06-Dec-15 10:11  metoprolol tartrate 25 mg oral tablet 1 tab(s) orally every 8 hours 06-Dec-15 10:02  diltiazem 60 mg oral tablet 1 tab(s) orally every 6 hours 06-Dec-15 10:02  levofloxacin 500 mg oral tablet 1 tab(s) orally every 24 hours 16-Dec-15 14:07   Vital Signs:  :: vital signs stable, patient afebrile.   Physical Exam:  General: well developed, well nourished, and in no acute distress  Mental Status: normal affect  Eyes: anicteric sclera  Respiratory: clear to auscultation  bilaterally  Cardiovascular: regular rate and rhythm, no murmur, rub, or gallop  Gastrointestinal: soft, normal active bowel sounds, colostomy noted.  Musculoskeletal: No edema  Skin: No rash or petechiae noted  Neurological: alert, answering all questions appropriately.  Cranial nerves grossly intact   Medical Imaging Results:   Review Medical Imaging   Color Flow Doppler Lower Extrem Right (Leg) 07-Jan-2014 12:00:00: IMPRESSION:  Extensive right lower extremity DVT which is largely occlusive  throughout the visualized deep veins.      Electronically Signed    By: Aletta Edouard M.D.    On: 01/07/2014 12:32     Verified By: Azzie Roup, M.D.,  Assessment and Plan: Impression:   Stage IIIB colon cancer, now with DVT. Plan:   1. Colon cancer: Patient will definitely benefit from adjuvant chemotherapy. He also will require a port prior to treatment. No further intervention is needed at this time. He will need a contrast CT of his abdomen and pelvis to complete staging workup. His preop CEA was also significantly elevated at 69.5. Follow-up in the [facility] as previously scheduled for further evaluation and treatment planning. He expressed understanding and was in agreement with this plan.DVT: Patient was transitioned to Lovenox 1.5 mg/kg today as well as Xarelto. He was also given a prescription for 15 mg Xarelto 2 times per day for 21 days. At this point, he will be switched to 20 mg daily for minimum of 6 months. Patient has been instructed to keep his follow-up appointment is scheduled above for further evaluation.  Electronic Signatures: Delight Hoh (MD)  (Signed 17-Dec-15 14:14)  Authored:  Note Type, CC/HPI, Review of Systems, ALLERGIES, Patient Family Social History, HOME MEDICATIONS, Vital Signs, Physical Exam, Rad Results Review, Assessment and Plan   Last Updated: 17-Dec-15 14:14 by Delight Hoh (MD)

## 2014-05-16 NOTE — Consult Note (Signed)
Note Type Consult   Subjective: Chief Complaint/Diagnosis:   Stage IIIB colon cancer. HPI:   Final pathologies resulted confirming stage IIIB colon cancer. Patient currently feels significantly improved, but not past baseline. He still has pain. He does not complain of any further chest pain or shortness of breath today. He continues to have a poor appetite, but does not complain of nausea. He otherwise feels well.   Review of Systems:  Performance Status (ECOG): 2  Review of Systems:   As per HPI. Otherwise, 10 point system review was negative.   Allergies:  No Known Allergies:   PFSH: Additional Past Medical and Surgical History: negative.    Family history: Grandmother with "liver cancer".    Social history: Positive tobacco, less than one pack per day. Moderate alcohol use. Occasional marijuana.   Home Medications: Medication Instructions Last Modified Date/Time  Tylenol 325 mg oral tablet 2 tab(s) orally every 4 hours, As Needed 24-Nov-15 09:49   Vital Signs:  :: vital signs stable, patient afebrile.   Physical Exam:  General: well developed, well nourished, and in no acute distress  Mental Status: normal affect  Eyes: anicteric sclera  Respiratory: clear to auscultation bilaterally  Cardiovascular: regular rate and rhythm, no murmur, rub, or gallop  Gastrointestinal: soft, normal active bowel sounds, colostomy noted.  Musculoskeletal: No edema  Skin: No rash or petechiae noted  Neurological: alert, answering all questions appropriately.  Cranial nerves grossly intact   Laboratory Results: Routine Chem:  03-Dec-15 04:56   Glucose, Serum  107  BUN  20  Creatinine (comp)  1.39  Sodium, Serum 138  Potassium, Serum  3.1  Chloride, Serum 102  CO2, Serum 28  Calcium (Total), Serum 8.5  Anion Gap 8  Osmolality (calc) 279  eGFR (African American) >60  eGFR (Non-African American)  55 (eGFR values <24m/min/1.73 m2 may be an indication of chronic kidney disease  (CKD). Calculated eGFR, using the MRDR Study equation, is useful in  patients with stable renal function. The eGFR calculation will not be reliable in acutely ill patients when serum creatinine is changing rapidly. It is not useful in patients on dialysis. The eGFR calculation may not be applicable to patients at the low and high extremes of body sizes, pregnant women, and vegetarians.)   Review Pathology Report: Pathology Reports:   Pathology Report:  (19-Dec-2013) PRE-OPERATIVE DIAGNOSIS:colon mass  DIAGNOSIS:provided       LEFT COLON; LEFT COLECTOMY: ? MUCINOUS ADENOCARCINOMA. ?TUMOR PENETRATES THE VISCERAL PERITONEUM. ?THE MARGINS OF RESECTION ARE NEGATIVE. ?TWO OF FIFTEEN LYMPH NODES ARE INVOLVED BY METASTASES (2/15). ?SEE SUMMARY BELOW.AND RECTUM: Resection, Including Transanal Disk Excision of RectalInvolvedA: Colon, Left and Rectum, Resection Cancer Case SummaryDescending colonOther (specify)colectomyTumor Site: Left (descending) colonSites Involved by Tumor:     None identifiedTumor Perforation:     Cannot be determinedspecifiedType:    Mucinous adenocarcinomaGrade:   Low-grade (well differentiated to moderatelySize:    Greatest dimension (cm)Tumor Extension:  Tumor penetrates to the surface of theperitoneum (serosa)Margin:    Uninvolved by invasive carcinomaNo adenoma or intraepithelial neoplasia / dysplasia identifiedMargin: Uninvolved by invasive carcinomaNo adenoma or intraepithelial neoplasia / dysplasia identified(Radial) or Mesenteric Margin:    Uninvolved by invasiveFINDINGSInvasion: PresentInvasion:     Not identifiedDeposits:     Not identifiedidentified (pTNM)Descriptors:    N/ATumor (pT): pT4a: Tumor penetrates the visceral peritoneumLymph Nodes (pN)Metastasis in 2 to 3 regional lymph nodesof Lymph Nodes Examined:    Specifyof Lymph Nodes Involved:    SpecifyMetastasis (pM): Not applicable features suggestive  of microsatellite instability are presentinclude a mucinous  tumor component (>50% in representativeAncillary testing is available upon request.     Assessment and Plan: Impression:   Stage IIIB colon cancer. Plan:   1. Colon cancer: Final pathology results as above. Patient will definitely benefit from adjuvant chemotherapy. He also will require a port prior to treatment. Will discuss with surgery if this is a possibility prior to discharge. No further intervention is needed at this time. He will need a contrast CT of his abdomen and pelvis to complete staging workup. His preop CEA was also significantly elevated at 69.5. Follow-up in the [facility] approximately 2 weeks after discharge for further evaluation and treatment planning. He expressed understanding and was in agreement with this plan. consult, call with questions.   Electronic Signatures: Delight Hoh (MD)  (Signed 03-Dec-15 17:46)  Authored: Note Type, CC/HPI, Review of Systems, ALLERGIES, Patient Family Social History, HOME MEDICATIONS, Vital Signs, Physical Exam, Lab Results Review, Pathology Report Review, Assessment and Plan   Last Updated: 03-Dec-15 17:46 by Delight Hoh (MD)

## 2014-05-16 NOTE — Consult Note (Signed)
Note Type Consult   Subjective: Chief Complaint/Diagnosis:   Colon mass, highly suspicious for malignancy. HPI:   Patient is a 64 year old male who presented to the emergency room with a one week history of sharp lower abdominal pain. He also reported increased nausea and vomiting and minimal bowel movements. Subsequent workup included a CT scan which revealed a possible obstructing descending colon mass. Patient underwent partial colon resection and colostomy. Pathology results are pending, but this is highly suspicious for colon cancer. Currently he is lethargic. He continues to have abdominal pain. He continues to feel nauseous. He has no neurologic complaint. He denies any chest pain or shortness of breath. He is having bowel movements through his colostomy. Patient offers no further specific complaints today.   Review of Systems:  Performance Status (ECOG): 2  Review of Systems:   As per HPI. Otherwise, 10 point system review was negative.   Allergies:  No Known Allergies:   PFSH: Additional Past Medical and Surgical History: negative.    Family history: Grandmother with "liver cancer".    Social history: Positive tobacco, less than one pack per day. Moderate alcohol use. Occasional marijuana.   Home Medications: Medication Instructions Last Modified Date/Time  Tylenol 325 mg oral tablet 2 tab(s) orally every 4 hours, As Needed 24-Nov-15 09:49   Vital Signs:  :: vital signs stable, patient afebrile.   Physical Exam:  General: well developed, well nourished, and in no acute distress  Mental Status: normal affect, lethargic  Eyes: anicteric sclera  Head, Ears, Nose,Throat: NG tube in place.  Neck, Thyroid: No palpable lymphadenopathy, thyroid midline without nodules.  Respiratory: clear to auscultation bilaterally  Cardiovascular: regular rate and rhythm, no murmur, rub, or gallop  Gastrointestinal: soft, normal active bowel sounds, colostomy noted.  Genitourinary: Foley  catheter in place.  Musculoskeletal: No edema  Skin: No rash or petechiae noted  Neurological: alert, answering all questions appropriately.  Cranial nerves grossly intact   Laboratory Results: Routine Chem:  29-Nov-15 07:22   Glucose, Serum  114  BUN  20  Creatinine (comp) 1.15  Sodium, Serum 142  Potassium, Serum 3.7  Chloride, Serum 104  CO2, Serum 29  Calcium (Total), Serum 9.1  Anion Gap 9  Osmolality (calc) 287  eGFR (African American) >60  eGFR (Non-African American) >60 (eGFR values <55m/min/1.73 m2 may be an indication of chronic kidney disease (CKD). Calculated eGFR, using the MRDR Study equation, is useful in  patients with stable renal function. The eGFR calculation will not be reliable in acutely ill patients when serum creatinine is changing rapidly. It is not useful in patients on dialysis. The eGFR calculation may not be applicable to patients at the low and high extremes of body sizes, pregnant women, and vegetarians.)  Routine Hem:  29-Nov-15 07:22   WBC (CBC) 5.6  RBC (CBC) 4.90  Hemoglobin (CBC) 14.0  Hematocrit (CBC) 42.9  Platelet Count (CBC) 217  MCV 87  MCH 28.5  MCHC 32.6  RDW  15.0  Neutrophil % 78.5  Lymphocyte % 11.1  Monocyte % 9.6  Eosinophil % 0.6  Basophil % 0.2  Neutrophil # 4.4  Lymphocyte #  0.6  Monocyte # 0.5  Eosinophil # 0.0  Basophil # 0.0 (Result(s) reported on 21 Dec 2013 at 07:37AM.)   Lab Results Review:  Lab Results   LAST 5 Results: CEA: 69.5 (H)   Assessment and Plan: Impression:   Likely colon cancer. Plan:   1. Colon cancer: Will await final pathology results.  Patient will ultimately need a CT of his abdomen and pelvis with contrast to complete his staging workup. Preop CEA is 69.5. No further intervention is needed at this time. Patient should follow-up in the [facility] approximately 1-2 weeks after discharge to discuss his final pathology results and any adjuvant chemotherapy if needed.  Although, the  microperforation noted in the operative report will likely indicate he needs adjuvant treatment. He may also require a port placement in the future. consult, will follow.  Electronic Signatures: Delight Hoh (MD)  (Signed [phone] 09:14)  Authored: Note Type, CC/HPI, Review of Systems, ALLERGIES, Patient Family Social History, HOME MEDICATIONS, Vital Signs, Physical Exam, Lab Results Review, Assessment and Plan   Last Updated: 29-Nov-15 09:14 by Delight Hoh (MD)

## 2014-05-16 NOTE — Consult Note (Signed)
PATIENT NAME:  James Keller, James Keller MR#:  [id number] DATE OF BIRTH:  [birth year]  DATE OF CONSULTATION:  12/16/2013  REFERRING PHYSICIAN:   CONSULTING PHYSICIAN:  Davelyn Gwinn A. Alyssamarie Mounsey, MD  REASON FOR CONSULTATION: Abdominal pain, nausea, vomiting concern for a large bowel obstruction.   HISTORY OF PRESENT ILLNESS: James Keller is a 64 year old African American male with no known medical history but who does not see a doctor regularly and has not for years, who presents with 3 days of suprapubic pain which he describes as coming in waves and sharp in nature. It has been going on since last Saturday. Also had some nausea and vomiting.  Has not had any bowel movements. Was noted to have a pressure in the 230s. Had a CT scan which showed a possible mid descending colonic mass with a significant amount of fluid and dilated colon approximately to this.  Last full bowel movement was Saturday. Does have some subjective chills. No fevers. No headache, chest pain, night sweats, shortness of breath, cough, dysuria or hematuria.   PAST MEDICAL HISTORY: None noted. Status post left femur fracture repair.   ALLERGIES: None.   MEDICATIONS: None.   SOCIAL HISTORY: Smokes 1 pack a day. Drinks a 6-pack of beer on the weekends. Occasional marijuana.   FAMILY HISTORY: Grandmother with liver cancer. Father passed away possibly with heart disease.   REVIEW OF SYSTEMS: A 12-point review of systems was taken. Pertinent positives and negatives as above.   PHYSICAL EXAMINATION: VITAL SIGNS: Temperature 98.4, pulse 89, blood pressure 161/96 currently, respirations 18.  GENERAL: No acute distress. Alert and oriented x.  HEAD: Normocephalic, atraumatic.  EYES: No scleral icterus. No conjunctivitis. FACE: No evidence of trauma. Normal external nose. Normal external ears.  CHEST: Lungs clear to auscultation. Moving air well.  HEART: Regular rate and rhythm. No murmurs, rubs or gallops.  ABDOMEN: Soft, minimally  tender. Nondistended.  EXTREMITIES: Moves all extremities well. Strength 5/5.  NEUROLOGIC: Cranial nerves II-XII intact.   LABORATORY DATA: Significant for a white cell count 8.1, hemoglobin 16.9, hematocrit 51.6, platelets of 262,000. Urinalysis is negative. Bilirubin of 1.4, creatinine 1.47, blood sugar 113 currently. CT scan shows dilated proximal large bowel with transition to smaller calibur of bowel in distal descending colon.   ASSESSMENT AND PLAN: James Keller is a pleasant 64 year old with a possible large bowel obstruction and stricture due to possible colonic mass, although unable to significantly assess on  CT scan. Will plan on colonoscopy for possible biopsy. Will likely require resection regardless. Unable to determine whether or not  the patient is able to undergo bowel prep and therefore will likely need either diverting loop colostomy or end colostomy. Will discuss with patient further. Appreciate IM and GI input.     ____________________________ Glena Norfolk. Sakeena Teall, MD cal:at D: 12/16/2013 17:08:05 ET T: 12/16/2013 17:19:34 ET JOB#: [id number]  cc: Harrell Gave A. Yamen Castrogiovanni, MD, <Dictator> Floyde Parkins MD ELECTRONICALLY SIGNED 12/29/2013 20:12

## 2014-05-16 NOTE — Discharge Summary (Signed)
PATIENT NAME:  James Keller, James Keller MR#:  [id number] DATE OF BIRTH:  09/14/1950  DATE OF ADMISSION:  12/16/2013 DATE OF DISCHARGE:  12/28/2013  FINAL DIAGNOSES: 1.  Obstructing colon cancer.  2.  Atrial fibrillation, resolved.  PRINCIPLE PROCEDURES: 1.  Laparotomy with sigmoid colectomy, repair of right colonic perforation and end colostomy by Dr. Rexene Edison.  2.  GI medicine consultation. Dr Rayann Heman 3.  Medicine consultation. 4.  CT scan of the abdomen and pelvis. 5.  Hematology/oncology consultation.   Dr Grayland Ormond 6.  Cardiology consultation. Dr Nehemiah Massed.  HOSPITAL COURSE SUMMARY: The patient was admitted with nausea, vomiting and abdominal pain to the medicine service on the 24th of November. It became obvious that the patient had a distal colonic obstruction. He was taken to the Operating Room by Dr. Rexene Edison on the 27th, at which point a large obstructing colon mass was encountered. Colostomy was constructed. His postoperative course was complicated only by the fact of rapid ventricular response, atrial fibrillation. He was placed in the Intensive Care Unit and placed on a Cardizem drip. He eventually converted. Dr. Nehemiah Massed did become involved in his care and assisted in medical management of his atrial fibrillation and no anticoagulation was administered. He was started on oral metoprolol and Cardizem. Nasogastric tube was able to be removed. Ostomy did begin functioning. C. difficile was negative. He was taught ostomy care and had adequate pain control.   PLAN: For followup as an outpatient in both the oncology and surgery offices with interval followup with Dr. Alveria Apley office. By the 6th of December, the patient was tolerating a regular diet and had adequate pain control. His wound was healing nicely and he was deemed suitable and stable for discharge.   DISCHARGE MEDICATIONS: Metoprolol 25 mg by mouth every 8 hours, diltiazem 60 mg by mouth every 6 hours, Norco 5/325 one tab every 6 hours  as needed for pain, and Mylanta as needed for reflux. He will follow up in our office in the [place] location on December 10. Call with any questions or concerns.    ____________________________ Jeannette How Marina Gravel, MD mab:sw D: 12/28/2013 10:41:05 ET T: 12/28/2013 11:36:57 ET JOB#: [id number]  cc: Elta Guadeloupe A. Marina Gravel, MD, <Dictator> Hortencia Conradi MD ELECTRONICALLY SIGNED 12/28/2013 18:48

## 2014-05-18 LAB — CREATININE, SERUM: CREATINE, SERUM: 1.58

## 2014-05-18 LAB — SURGICAL PATHOLOGY

## 2014-05-18 LAB — PROTIME-INR
INR: 1.3
Prothrombin Time: 16 secs — ABNORMAL HIGH

## 2014-05-21 ENCOUNTER — Other Ambulatory Visit: Payer: Self-pay

## 2014-05-21 DIAGNOSIS — C189 Malignant neoplasm of colon, unspecified: Secondary | ICD-10-CM

## 2014-05-24 NOTE — Op Note (Signed)
PATIENT NAME:  James Keller, James Keller MR#:  [id number] DATE OF BIRTH:  08-16-1950  DATE OF PROCEDURE:  02/05/2014  SURGEON:  Harrell Gave A. Jacqulynn Shappell, MD  PREOPERATIVE DIAGNOSIS:  Colon cancer, need for chemotherapy.   POSTOPERATIVE DIAGNOSIS:  Colon cancer, need for chemotherapy.   PROCEDURE PERFORMED:  Right subclavian Port-A-Cath placement.   ANESTHESIA:  General.   ESTIMATED BLOOD LOSS:  10 mL.   COMPLICATIONS:  None.   SPECIMENS:  None.   INDICATIONS FOR PROCEDURE:  [name] is a pleasant 64 year old male who was previously diagnosed with colon cancer, who is node positive and need for chemotherapy. I was thus consulted for Port-A-Cath placement.   DETAILS OF PROCEDURE AS FOLLOWS:  Informed consent was obtained. Mr. Agustin was brought to the operating room suite. LMA was placed. General anesthesia was administered. His right neck and chest were prepped and draped in standard surgical fashion. A timeout was then performed, correctly identifying patient name, operative site, and procedure to be performed. The subclavian vein was accessed with a needle under the clavicle with one stick. A wire was placed through it. Fluoroscopy was brought in to show that the wire went to the cavoatrial junction. The needle was then withdrawn. A pocket was then made through the puncture site inferiorly. The port was then sutured to the chest wall. The sheath and dilator were then placed over the wire. The wire and dilator were then removed, leaving the sheath. A catheter was then placed under fluoroscopy through the sheath to the cavoatrial junction. The sheath was then broken and the 2 split ends separated. The catheter was then attached to the port. The previously placed sutures were tied. The port was then accessed with a Huber needle and found to function well. Hemostasis was obtained. The skin was closed with an interrupted 3-0 Vicryl deep dermal and a 4-0 Monocryl running subcuticular. Postprocedure  chest x-ray showed Port-A-Cath without pneumothorax. The drapes were then taken down. The patient was then awoken, extubated, and brought to the postanesthesia care unit. There were no immediate complications. Needle, sponge, and instrument counts were correct at the end of the procedure.    ____________________________ Glena Norfolk. Krisandra Bueno, MD cal:nb D: 02/05/2014 19:20:37 ET T: 02/05/2014 22:23:47 ET JOB#: [id number]  cc: Harrell Gave A. Jesly Hartmann, MD, <Dictator> Floyde Parkins MD ELECTRONICALLY SIGNED 02/11/2014 9:02

## 2014-05-26 ENCOUNTER — Ambulatory Visit: Payer: Self-pay

## 2014-05-26 ENCOUNTER — Inpatient Hospital Stay: Payer: Medicaid Other

## 2014-05-26 ENCOUNTER — Other Ambulatory Visit: Payer: Self-pay

## 2014-05-26 ENCOUNTER — Encounter: Payer: Self-pay | Admitting: Oncology

## 2014-05-26 ENCOUNTER — Inpatient Hospital Stay (HOSPITAL_BASED_OUTPATIENT_CLINIC_OR_DEPARTMENT_OTHER): Payer: Medicaid Other | Admitting: Oncology

## 2014-05-26 ENCOUNTER — Inpatient Hospital Stay: Payer: Medicaid Other | Attending: Oncology | Admitting: Oncology

## 2014-05-26 ENCOUNTER — Ambulatory Visit: Payer: Self-pay | Admitting: Oncology

## 2014-05-26 VITALS — BP 154/86 | HR 55 | Temp 97.6°F | Wt 229.7 lb

## 2014-05-26 DIAGNOSIS — F17210 Nicotine dependence, cigarettes, uncomplicated: Secondary | ICD-10-CM

## 2014-05-26 DIAGNOSIS — R197 Diarrhea, unspecified: Secondary | ICD-10-CM

## 2014-05-26 DIAGNOSIS — E876 Hypokalemia: Secondary | ICD-10-CM

## 2014-05-26 DIAGNOSIS — Z452 Encounter for adjustment and management of vascular access device: Secondary | ICD-10-CM | POA: Insufficient documentation

## 2014-05-26 DIAGNOSIS — R350 Frequency of micturition: Secondary | ICD-10-CM | POA: Diagnosis not present

## 2014-05-26 DIAGNOSIS — R7989 Other specified abnormal findings of blood chemistry: Secondary | ICD-10-CM

## 2014-05-26 DIAGNOSIS — Z418 Encounter for other procedures for purposes other than remedying health state: Secondary | ICD-10-CM | POA: Insufficient documentation

## 2014-05-26 DIAGNOSIS — Z79899 Other long term (current) drug therapy: Secondary | ICD-10-CM | POA: Diagnosis not present

## 2014-05-26 DIAGNOSIS — C189 Malignant neoplasm of colon, unspecified: Secondary | ICD-10-CM

## 2014-05-26 DIAGNOSIS — Z9049 Acquired absence of other specified parts of digestive tract: Secondary | ICD-10-CM | POA: Diagnosis not present

## 2014-05-26 DIAGNOSIS — I8290 Acute embolism and thrombosis of unspecified vein: Secondary | ICD-10-CM

## 2014-05-26 DIAGNOSIS — Z86718 Personal history of other venous thrombosis and embolism: Secondary | ICD-10-CM | POA: Insufficient documentation

## 2014-05-26 DIAGNOSIS — Z7901 Long term (current) use of anticoagulants: Secondary | ICD-10-CM | POA: Insufficient documentation

## 2014-05-26 DIAGNOSIS — I82409 Acute embolism and thrombosis of unspecified deep veins of unspecified lower extremity: Secondary | ICD-10-CM

## 2014-05-26 DIAGNOSIS — J029 Acute pharyngitis, unspecified: Secondary | ICD-10-CM | POA: Diagnosis not present

## 2014-05-26 DIAGNOSIS — Z5111 Encounter for antineoplastic chemotherapy: Secondary | ICD-10-CM | POA: Insufficient documentation

## 2014-05-26 DIAGNOSIS — R300 Dysuria: Secondary | ICD-10-CM

## 2014-05-26 LAB — CBC WITH DIFFERENTIAL/PLATELET
Basophils Absolute: 0 10*3/uL (ref 0–0.1)
Basophils Relative: 1 %
EOS ABS: 0.1 10*3/uL (ref 0–0.7)
Eosinophils Relative: 1 %
HCT: 37.3 % (ref 39.0–52.0)
HEMOGLOBIN: 12.3 g/dL (ref 13.0–17.0)
LYMPHS ABS: 1.6 10*3/uL (ref 1.0–3.6)
MCH: 28.3 pg (ref 26.0–34.0)
MCHC: 32.8 g/dL (ref 30.0–36.0)
MCV: 86.2 fL (ref 78.0–100.0)
Monocytes Absolute: 0.7 10*3/uL (ref 0.2–1.0)
Monocytes Relative: 9 %
NEUTROS ABS: 4.9 10*3/uL (ref 1.4–6.5)
PLATELETS: 145 10*3/uL (ref 150–400)
RBC: 4.33 MIL/uL (ref 4.22–5.81)
RDW: 19.6 % (ref 11.5–15.5)
WBC: 7.3 10*3/uL (ref 4.0–10.5)

## 2014-05-26 LAB — PROTIME-INR
INR: 1.39
PROTHROMBIN TIME: 17.3 s — AB (ref 11.4–15.0)

## 2014-05-26 LAB — COMPREHENSIVE METABOLIC PANEL
ALT: 25 U/L (ref 17–63)
AST: 23 U/L (ref 15–41)
Albumin: 3.6 g/dL (ref 3.5–5.0)
Alkaline Phosphatase: 95 U/L (ref 38–126)
Anion gap: 4 — ABNORMAL LOW (ref 5–15)
BILIRUBIN TOTAL: 0.5 mg/dL (ref 0.3–1.2)
BUN: 20 mg/dL (ref 6–20)
CALCIUM: 8.9 mg/dL (ref 8.9–10.3)
CHLORIDE: 106 mmol/L (ref 101–111)
CO2: 28 mmol/L (ref 22–32)
Creatinine, Ser: 1.11 mg/dL (ref 0.61–1.24)
GFR calc Af Amer: >60 mL/min (ref >60)
GFR calc non Af Amer: >60 mL/min (ref >60)
GLUCOSE: 104 mg/dL (ref 70–99)
Potassium: 4.1 mmol/L (ref 3.5–5.1)
SODIUM: 138 mmol/L (ref 135–145)
Total Protein: 6.7 g/dL (ref 6.5–8.1)

## 2014-05-26 MED ORDER — FLUOROURACIL CHEMO INJECTION 5 GM/100ML
2400.0000 mg/m2 | INTRAVENOUS | Status: DC
  Administered 2014-05-26: 5500 mg via INTRAVENOUS
  Filled 2014-05-26: qty 100

## 2014-05-26 MED ORDER — PALONOSETRON HCL INJECTION 0.25 MG/5ML
0.2500 mg | Freq: Once | INTRAVENOUS | Status: AC
  Administered 2014-05-26: 0.25 mg via INTRAVENOUS
  Filled 2014-05-26: qty 5

## 2014-05-26 MED ORDER — SODIUM CHLORIDE 0.9 % IV SOLN
Freq: Once | INTRAVENOUS | Status: AC
  Administered 2014-05-26: 13:00:00 via INTRAVENOUS
  Filled 2014-05-26: qty 5

## 2014-05-26 MED ORDER — DEXTROSE 5 % IV SOLN
Freq: Once | INTRAVENOUS | Status: AC
  Administered 2014-05-26: 12:00:00 via INTRAVENOUS
  Filled 2014-05-26: qty 250

## 2014-05-26 MED ORDER — HEPARIN SOD (PORK) LOCK FLUSH 100 UNIT/ML IV SOLN
500.0000 [IU] | Freq: Once | INTRAVENOUS | Status: DC | PRN
  Filled 2014-05-26: qty 5

## 2014-05-26 MED ORDER — SODIUM CHLORIDE 0.9 % IJ SOLN
10.0000 mL | INTRAMUSCULAR | Status: DC | PRN
  Filled 2014-05-26: qty 10

## 2014-05-26 MED ORDER — OXALIPLATIN CHEMO INJECTION 100 MG/20ML
85.0000 mg/m2 | Freq: Once | INTRAVENOUS | Status: AC
  Administered 2014-05-26: 195 mg via INTRAVENOUS
  Filled 2014-05-26: qty 39

## 2014-05-26 MED ORDER — DEXTROSE 5 % IV SOLN
900.0000 mg | Freq: Once | INTRAVENOUS | Status: AC
  Administered 2014-05-26: 900 mg via INTRAVENOUS
  Filled 2014-05-26: qty 35

## 2014-05-26 MED ORDER — FLUOROURACIL CHEMO INJECTION 2.5 GM/50ML
400.0000 mg/m2 | Freq: Once | INTRAVENOUS | Status: AC
  Administered 2014-05-26: 900 mg via INTRAVENOUS
  Filled 2014-05-26: qty 18

## 2014-05-26 NOTE — Progress Notes (Signed)
[place]  Telephone:(336) [phone] Fax:(336) [phone]  ID: James Keller OB: 21-Dec-1950  MR#: [id number]  MLJ#:[id number]  Patient Care Team: No Pcp Per Patient as PCP - General (General Practice)  CHIEF COMPLAINT:  Chief Complaint  Patient presents with  . Follow-up    Colon Cancer  . Chemotherapy  . Sore Throat    eyes watery    INTERVAL HISTORY: Patient returns to clinic today for further evaluation and consideration of cycle 8 of 12 of FOLFOX. He has had some frequency of urination with mild burning over the past week, but otherwise has felt well. He does not complain of diarrhea today. He otherwise feels well and is asymptomatic. He denies any fevers. He has no new neurologic complaints. He has no chest pain or shortness of breath. He denies any weight loss. He denies any easy bleeding or bruising. He has no nausea, vomiting, or constipation. Patient offers no further specific complaints today.   REVIEW OF SYSTEMS:   Review of Systems  Constitutional: Negative for malaise/fatigue.  Respiratory: Negative for shortness of breath.   Gastrointestinal: Negative for nausea and diarrhea.  Genitourinary: Positive for frequency.  Neurological: Negative for weakness.    As per HPI. Otherwise, a complete review of systems is negatve.  PAST MEDICAL HISTORY: Past Medical History  Diagnosis Date  . Colon cancer   . DVT (deep venous thrombosis)     PAST SURGICAL HISTORY: Past Surgical History  Procedure Laterality Date  . Colon resection  12/19/2013    FAMILY HISTORY History reviewed. No pertinent family history.     ADVANCED DIRECTIVES:    HEALTH MAINTENANCE: History  Substance Use Topics  . Smoking status: Current Every Day Smoker -- 1.00 packs/day for 54 years    Types: Cigarettes  . Smokeless tobacco: Not on file  . Alcohol Use: Yes     Comment: moderate     Colonoscopy:  PAP:  Bone density:  Lipid panel:  Allergies    Allergen Reactions  . No Known Allergies     Current Outpatient Prescriptions  Medication Sig Dispense Refill  . metoprolol tartrate (LOPRESSOR) 25 MG tablet Take 25 mg by mouth 2 (two) times daily.    Marland Kitchen warfarin (COUMADIN) 5 MG tablet Take 7.5 mg by mouth daily. 1.5 tabs orally once a day, as directed.  Held last wed & Thurs.  Took 1.5 tabs Fri & Sat.  Held Sun & monday     No current facility-administered medications for this visit.   Facility-Administered Medications Ordered in Other Visits  Medication Dose Route Frequency Provider Last Rate Last Dose  . fluorouracil (ADRUCIL) 5,500 mg in sodium chloride 0.9 % 140 mL chemo infusion  2,400 mg/m2 (Treatment Plan Actual) Intravenous 1 day or 1 dose Lloyd Huger, MD   5,500 mg at 05/26/14 1557  . heparin lock flush 100 unit/mL  500 Units Intracatheter Once PRN Lloyd Huger, MD      . sodium chloride 0.9 % injection 10 mL  10 mL Intracatheter PRN Lloyd Huger, MD        OBJECTIVE: Filed Vitals:   05/26/14 1102  BP: 154/86  Pulse: 55  Temp: 97.6 F (36.4 C)     Body mass index is 30.78 kg/(m^2).    ECOG FS:1 - Symptomatic but completely ambulatory  General: Well-developed, well-nourished, no acute distress. Eyes: anicteric sclera. Lungs: Clear to auscultation bilaterally. Heart: Regular rate and rhythm. No rubs, murmurs, or gallops. Abdomen: Soft, nontender, nondistended.  No organomegaly noted, normoactive bowel sounds. Musculoskeletal: No edema, cyanosis, or clubbing. Neuro: Alert, answering all questions appropriately. Cranial nerves grossly intact. Skin: No rashes or petechiae noted. Psych: Normal affect.    LAB RESULTS:  Lab Results  Component Value Date   NA 138 05/26/2014   K 4.1 05/26/2014   CL 106 05/26/2014   CO2 28 05/26/2014   GLUCOSE 104 05/26/2014   BUN 20 05/26/2014   CREATININE 1.11 05/26/2014   CALCIUM 8.9 05/26/2014   PROT 6.7 05/26/2014   ALBUMIN 3.6 05/26/2014   AST 23  05/26/2014   ALT 25 05/26/2014   ALKPHOS 95 05/26/2014   BILITOT 0.5 05/26/2014   GFRNONAA >60 05/26/2014   GFRAA >60 05/26/2014    Lab Results  Component Value Date   WBC 7.3 05/26/2014   NEUTROABS 4.9 05/26/2014   HGB 12.3 05/26/2014   HCT 37.3 05/26/2014   MCV 86.2 05/26/2014   PLT 145 05/26/2014     STUDIES: No results found.  ASSESSMENT: Stage IIIB colon cancer, DVT.  PLAN:    1. Colon cancer: Preop CEA was elevated at 69.5, it is now within normal limits. Proceed with cycle 8 of 12 of adjuvant FOLFOX today. Patient will receive Neulasta with the remainder of his treatments. Return to clinic in 2 days for pump removal and Neulasta and in 2 weeks for consideration of cycle 9. 2. DVT: Patient could not afford Xarelto and was initiated on Coumadin. INR pending at time of dictation.  Countinue at 7.5 mg daily M-F. Patient will require 6 months of treatment.  3. Elevated creatinine: Patient creatinine is now within normal limits. 4. Hypokalemia: Potassium now within normal limits, monitor. 5. Diarrhea: Improving. Continue Lomotil as needed. 6. Urinary frequency: Urinalysis is pending at time of dictation. We will consider antibiotic if positive.  Patient expressed understanding and was in agreement with this plan. He also understands that He can call clinic at any time with any questions, concerns, or complaints.   Colon cancer   Staging form: Neuroendocrine Tumor - Colon/Rectum, AJCC 7th Edition     Clinical stage from 05/04/2014: Stage IIIB (T4, N1, M0) - Signed by Lloyd Huger, MD on 05/04/2014   Lloyd Huger, MD   05/26/2014 4:59 PM

## 2014-05-26 NOTE — Progress Notes (Deleted)
Hoy Morn James Keller presents today for phlebotomy per MD orders. Phlebotomy procedure started at *** and ended at ***. *** grams removed. Patient observed for 30 minutes after procedure without any incident. Patient tolerated procedure well. IV needle removed intact.

## 2014-05-26 NOTE — Patient Instructions (Signed)
Fluorouracil, 5-FU injection What is this medicine? FLUOROURACIL, 5-FU (flure oh YOOR a sil) is a chemotherapy drug. It slows the growth of cancer cells. This medicine is used to treat many types of cancer like breast cancer, colon or rectal cancer, pancreatic cancer, and stomach cancer. This medicine may be used for other purposes; ask your health care provider or pharmacist if you have questions. COMMON BRAND NAME(S): Adrucil What should I tell my health care provider before I take this medicine? They need to know if you have any of these conditions: -blood disorders -dihydropyrimidine dehydrogenase (DPD) deficiency -infection (especially a virus infection such as chickenpox, cold sores, or herpes) -kidney disease -liver disease -malnourished, poor nutrition -recent or ongoing radiation therapy -an unusual or allergic reaction to fluorouracil, other chemotherapy, other medicines, foods, dyes, or preservatives -pregnant or trying to get pregnant -breast-feeding How should I use this medicine? This drug is given as an infusion or injection into a vein. It is administered in a hospital or clinic by a specially trained health care professional. Talk to your pediatrician regarding the use of this medicine in children. Special care may be needed. Overdosage: If you think you have taken too much of this medicine contact a poison control center or emergency room at once. NOTE: This medicine is only for you. Do not share this medicine with others. What if I miss a dose? It is important not to miss your dose. Call your doctor or health care professional if you are unable to keep an appointment. What may interact with this medicine? -allopurinol -cimetidine -dapsone -digoxin -hydroxyurea -leucovorin -levamisole -medicines for seizures like ethotoin, fosphenytoin, phenytoin -medicines to increase blood counts like filgrastim, pegfilgrastim, sargramostim -medicines that treat or prevent blood  clots like warfarin, enoxaparin, and dalteparin -methotrexate -metronidazole -pyrimethamine -some other chemotherapy drugs like busulfan, cisplatin, estramustine, vinblastine -trimethoprim -trimetrexate -vaccines Talk to your doctor or health care professional before taking any of these medicines: -acetaminophen -aspirin -ibuprofen -ketoprofen -naproxen This list may not describe all possible interactions. Give your health care provider a list of all the medicines, herbs, non-prescription drugs, or dietary supplements you use. Also tell them if you smoke, drink alcohol, or use illegal drugs. Some items may interact with your medicine. What should I watch for while using this medicine? Visit your doctor for checks on your progress. This drug may make you feel generally unwell. This is not uncommon, as chemotherapy can affect healthy cells as well as cancer cells. Report any side effects. Continue your course of treatment even though you feel ill unless your doctor tells you to stop. In some cases, you may be given additional medicines to help with side effects. Follow all directions for their use. Call your doctor or health care professional for advice if you get a fever, chills or sore throat, or other symptoms of a cold or flu. Do not treat yourself. This drug decreases your body's ability to fight infections. Try to avoid being around people who are sick. This medicine may increase your risk to bruise or bleed. Call your doctor or health care professional if you notice any unusual bleeding. Be careful brushing and flossing your teeth or using a toothpick because you may get an infection or bleed more easily. If you have any dental work done, tell your dentist you are receiving this medicine. Avoid taking products that contain aspirin, acetaminophen, ibuprofen, naproxen, or ketoprofen unless instructed by your doctor. These medicines may hide a fever. Do not become pregnant while taking this  medicine. Women should inform their doctor if they wish to become pregnant or think they might be pregnant. There is a potential for serious side effects to an unborn child. Talk to your health care professional or pharmacist for more information. Do not breast-feed an infant while taking this medicine. Men should inform their doctor if they wish to father a child. This medicine may lower sperm counts. Do not treat diarrhea with over the counter products. Contact your doctor if you have diarrhea that lasts more than 2 days or if it is severe and watery. This medicine can make you more sensitive to the sun. Keep out of the sun. If you cannot avoid being in the sun, wear protective clothing and use sunscreen. Do not use sun lamps or tanning beds/booths. What side effects may I notice from receiving this medicine? Side effects that you should report to your doctor or health care professional as soon as possible: -allergic reactions like skin rash, itching or hives, swelling of the face, lips, or tongue -low blood counts - this medicine may decrease the number of white blood cells, red blood cells and platelets. You may be at increased risk for infections and bleeding. -signs of infection - fever or chills, cough, sore throat, pain or difficulty passing urine -signs of decreased platelets or bleeding - bruising, pinpoint red spots on the skin, black, tarry stools, blood in the urine -signs of decreased red blood cells - unusually weak or tired, fainting spells, lightheadedness -breathing problems -changes in vision -chest pain -mouth sores -nausea and vomiting -pain, swelling, redness at site where injected -pain, tingling, numbness in the hands or feet -redness, swelling, or sores on hands or feet -stomach pain -unusual bleeding Side effects that usually do not require medical attention (report to your doctor or health care professional if they continue or are bothersome): -changes in finger or  toe nails -diarrhea -dry or itchy skin -hair loss -headache -loss of appetite -sensitivity of eyes to the light -stomach upset -unusually teary eyes This list may not describe all possible side effects. Call your doctor for medical advice about side effects. You may report side effects to FDA at 1-800-FDA-1088. Where should I keep my medicine? This drug is given in a hospital or clinic and will not be stored at home. NOTE: This sheet is a summary. It may not cover all possible information. If you have questions about this medicine, talk to your doctor, pharmacist, or health care provider.  2015, Elsevier/Gold Standard. (2007-05-15 13:53:16) Oxaliplatin Injection What is this medicine? OXALIPLATIN (ox AL i PLA tin) is a chemotherapy drug. It targets fast dividing cells, like cancer cells, and causes these cells to die. This medicine is used to treat cancers of the colon and rectum, and many other cancers. This medicine may be used for other purposes; ask your health care provider or pharmacist if you have questions. COMMON BRAND NAME(S): Eloxatin What should I tell my health care provider before I take this medicine? They need to know if you have any of these conditions: -kidney disease -an unusual or allergic reaction to oxaliplatin, other chemotherapy, other medicines, foods, dyes, or preservatives -pregnant or trying to get pregnant -breast-feeding How should I use this medicine? This drug is given as an infusion into a vein. It is administered in a hospital or clinic by a specially trained health care professional. Talk to your pediatrician regarding the use of this medicine in children. Special care may be needed. Overdosage: If you think you  have taken too much of this medicine contact a poison control center or emergency room at once. NOTE: This medicine is only for you. Do not share this medicine with others. What if I miss a dose? It is important not to miss a dose. Call your  doctor or health care professional if you are unable to keep an appointment. What may interact with this medicine? -medicines to increase blood counts like filgrastim, pegfilgrastim, sargramostim -probenecid -some antibiotics like amikacin, gentamicin, neomycin, polymyxin B, streptomycin, tobramycin -zalcitabine Talk to your doctor or health care professional before taking any of these medicines: -acetaminophen -aspirin -ibuprofen -ketoprofen -naproxen This list may not describe all possible interactions. Give your health care provider a list of all the medicines, herbs, non-prescription drugs, or dietary supplements you use. Also tell them if you smoke, drink alcohol, or use illegal drugs. Some items may interact with your medicine. What should I watch for while using this medicine? Your condition will be monitored carefully while you are receiving this medicine. You will need important blood work done while you are taking this medicine. This medicine can make you more sensitive to cold. Do not drink cold drinks or use ice. Cover exposed skin before coming in contact with cold temperatures or cold objects. When out in cold weather wear warm clothing and cover your mouth and nose to warm the air that goes into your lungs. Tell your doctor if you get sensitive to the cold. This drug may make you feel generally unwell. This is not uncommon, as chemotherapy can affect healthy cells as well as cancer cells. Report any side effects. Continue your course of treatment even though you feel ill unless your doctor tells you to stop. In some cases, you may be given additional medicines to help with side effects. Follow all directions for their use. Call your doctor or health care professional for advice if you get a fever, chills or sore throat, or other symptoms of a cold or flu. Do not treat yourself. This drug decreases your body's ability to fight infections. Try to avoid being around people who are  sick. This medicine may increase your risk to bruise or bleed. Call your doctor or health care professional if you notice any unusual bleeding. Be careful brushing and flossing your teeth or using a toothpick because you may get an infection or bleed more easily. If you have any dental work done, tell your dentist you are receiving this medicine. Avoid taking products that contain aspirin, acetaminophen, ibuprofen, naproxen, or ketoprofen unless instructed by your doctor. These medicines may hide a fever. Do not become pregnant while taking this medicine. Women should inform their doctor if they wish to become pregnant or think they might be pregnant. There is a potential for serious side effects to an unborn child. Talk to your health care professional or pharmacist for more information. Do not breast-feed an infant while taking this medicine. Call your doctor or health care professional if you get diarrhea. Do not treat yourself. What side effects may I notice from receiving this medicine? Side effects that you should report to your doctor or health care professional as soon as possible: -allergic reactions like skin rash, itching or hives, swelling of the face, lips, or tongue -low blood counts - This drug may decrease the number of white blood cells, red blood cells and platelets. You may be at increased risk for infections and bleeding. -signs of infection - fever or chills, cough, sore throat, pain or difficulty  passing urine -signs of decreased platelets or bleeding - bruising, pinpoint red spots on the skin, black, tarry stools, nosebleeds -signs of decreased red blood cells - unusually weak or tired, fainting spells, lightheadedness -breathing problems -chest pain, pressure -cough -diarrhea -jaw tightness -mouth sores -nausea and vomiting -pain, swelling, redness or irritation at the injection site -pain, tingling, numbness in the hands or feet -problems with balance, talking,  walking -redness, blistering, peeling or loosening of the skin, including inside the mouth -trouble passing urine or change in the amount of urine Side effects that usually do not require medical attention (report to your doctor or health care professional if they continue or are bothersome): -changes in vision -constipation -hair loss -loss of appetite -metallic taste in the mouth or changes in taste -stomach pain This list may not describe all possible side effects. Call your doctor for medical advice about side effects. You may report side effects to FDA at 1-800-FDA-1088. Where should I keep my medicine? This drug is given in a hospital or clinic and will not be stored at home. NOTE: This sheet is a summary. It may not cover all possible information. If you have questions about this medicine, talk to your doctor, pharmacist, or health care provider.  2015, Elsevier/Gold Standard. (2007-08-06 17:22:47)

## 2014-05-27 ENCOUNTER — Other Ambulatory Visit: Payer: Self-pay | Admitting: Oncology

## 2014-05-28 ENCOUNTER — Other Ambulatory Visit: Payer: Self-pay | Admitting: Oncology

## 2014-05-28 ENCOUNTER — Inpatient Hospital Stay: Payer: Medicaid Other

## 2014-05-28 VITALS — BP 134/84 | HR 78 | Temp 97.4°F | Resp 20

## 2014-05-28 DIAGNOSIS — C189 Malignant neoplasm of colon, unspecified: Secondary | ICD-10-CM

## 2014-05-28 DIAGNOSIS — Z5111 Encounter for antineoplastic chemotherapy: Secondary | ICD-10-CM | POA: Diagnosis not present

## 2014-05-28 DIAGNOSIS — I82409 Acute embolism and thrombosis of unspecified deep veins of unspecified lower extremity: Secondary | ICD-10-CM

## 2014-05-28 MED ORDER — PEGFILGRASTIM INJECTION 6 MG/0.6ML ~~LOC~~
6.0000 mg | PREFILLED_SYRINGE | Freq: Once | SUBCUTANEOUS | Status: AC
  Administered 2014-05-28: 6 mg via SUBCUTANEOUS
  Filled 2014-05-28: qty 0.6

## 2014-05-28 MED ORDER — HEPARIN SOD (PORK) LOCK FLUSH 100 UNIT/ML IV SOLN
500.0000 [IU] | Freq: Once | INTRAVENOUS | Status: AC
  Administered 2014-05-28: 500 [IU] via INTRAVENOUS
  Filled 2014-05-28: qty 5

## 2014-05-28 MED ORDER — SODIUM CHLORIDE 0.9 % IJ SOLN
10.0000 mL | INTRAMUSCULAR | Status: DC | PRN
  Administered 2014-05-28: 10 mL via INTRAVENOUS
  Filled 2014-05-28: qty 10

## 2014-05-28 MED ORDER — HEPARIN SOD (PORK) LOCK FLUSH 100 UNIT/ML IV SOLN
INTRAVENOUS | Status: AC
  Filled 2014-05-28: qty 5

## 2014-05-29 ENCOUNTER — Other Ambulatory Visit: Payer: Self-pay | Admitting: Oncology

## 2014-05-29 LAB — URINE CULTURE: Culture: 50000

## 2014-05-29 MED ORDER — SULFAMETHOXAZOLE-TRIMETHOPRIM 800-160 MG PO TABS: 1.0000 | ORAL_TABLET | Freq: Two times a day (BID) | ORAL | Status: DC

## 2014-05-29 NOTE — Progress Notes (Signed)
Called patient to notify rx is at pharmacy

## 2014-06-02 ENCOUNTER — Encounter: Payer: Self-pay | Admitting: *Deleted

## 2014-06-02 ENCOUNTER — Telehealth: Payer: Self-pay | Admitting: *Deleted

## 2014-06-02 ENCOUNTER — Inpatient Hospital Stay: Payer: Medicaid Other | Admitting: Oncology

## 2014-06-02 DIAGNOSIS — Z5111 Encounter for antineoplastic chemotherapy: Secondary | ICD-10-CM | POA: Diagnosis not present

## 2014-06-02 DIAGNOSIS — C189 Malignant neoplasm of colon, unspecified: Secondary | ICD-10-CM

## 2014-06-02 DIAGNOSIS — I82409 Acute embolism and thrombosis of unspecified deep veins of unspecified lower extremity: Secondary | ICD-10-CM

## 2014-06-02 LAB — COMPREHENSIVE METABOLIC PANEL
ALBUMIN: 3.9 g/dL (ref 3.5–5.0)
ALT: 28 U/L (ref 17–63)
AST: 24 U/L (ref 15–41)
Alkaline Phosphatase: 132 U/L — ABNORMAL HIGH (ref 38–126)
Anion gap: 5 (ref 5–15)
BUN: 15 mg/dL (ref 6–20)
CHLORIDE: 108 mmol/L (ref 101–111)
CO2: 23 mmol/L (ref 22–32)
CREATININE: 1.31 mg/dL — AB (ref 0.61–1.24)
Calcium: 9.1 mg/dL (ref 8.9–10.3)
GFR calc Af Amer: >60 mL/min (ref >60)
GFR calc non Af Amer: 57 mL/min — ABNORMAL LOW (ref >60)
Glucose, Bld: 106 mg/dL — ABNORMAL HIGH (ref 65–99)
POTASSIUM: 4 mmol/L (ref 3.5–5.1)
Sodium: 136 mmol/L (ref 135–145)
TOTAL PROTEIN: 7.4 g/dL (ref 6.5–8.1)
Total Bilirubin: 0.6 mg/dL (ref 0.3–1.2)

## 2014-06-02 LAB — CBC WITH DIFFERENTIAL/PLATELET
BASOS PCT: 0 %
Basophils Absolute: 0.1 10*3/uL (ref 0–0.1)
EOS ABS: 0.1 10*3/uL (ref 0–0.7)
EOS PCT: 1 %
HCT: 40.8 % (ref 40.0–52.0)
Hemoglobin: 13.1 g/dL (ref 13.0–18.0)
Lymphocytes Relative: 13 %
Lymphs Abs: 1.6 10*3/uL (ref 1.0–3.6)
MCH: 27.7 pg (ref 26.0–34.0)
MCHC: 32.1 g/dL (ref 32.0–36.0)
MCV: 86.3 fL (ref 80.0–100.0)
MONOS PCT: 8 %
Monocytes Absolute: 1 10*3/uL (ref 0.2–1.0)
Neutro Abs: 9.7 10*3/uL — ABNORMAL HIGH (ref 1.4–6.5)
Neutrophils Relative %: 78 %
PLATELETS: 142 10*3/uL — AB (ref 150–440)
RBC: 4.73 MIL/uL (ref 4.40–5.90)
RDW: 19.4 % — ABNORMAL HIGH (ref 11.5–14.5)
WBC: 12.5 10*3/uL — ABNORMAL HIGH (ref 3.8–10.6)

## 2014-06-02 LAB — PROTIME-INR
INR: 4.24 — AB
Prothrombin Time: 40.7 seconds — ABNORMAL HIGH (ref 11.4–15.0)

## 2014-06-02 NOTE — Telephone Encounter (Signed)
PT 40.7  INR 4.2. [place] notified of results

## 2014-06-07 ENCOUNTER — Other Ambulatory Visit: Payer: Self-pay | Admitting: Oncology

## 2014-06-09 ENCOUNTER — Inpatient Hospital Stay: Payer: Medicaid Other

## 2014-06-09 ENCOUNTER — Inpatient Hospital Stay (HOSPITAL_BASED_OUTPATIENT_CLINIC_OR_DEPARTMENT_OTHER): Payer: Medicaid Other | Admitting: Oncology

## 2014-06-09 VITALS — BP 151/84 | HR 76 | Temp 97.2°F | Resp 18 | Wt 227.7 lb

## 2014-06-09 DIAGNOSIS — Z452 Encounter for adjustment and management of vascular access device: Secondary | ICD-10-CM

## 2014-06-09 DIAGNOSIS — Z79899 Other long term (current) drug therapy: Secondary | ICD-10-CM

## 2014-06-09 DIAGNOSIS — Z9049 Acquired absence of other specified parts of digestive tract: Secondary | ICD-10-CM

## 2014-06-09 DIAGNOSIS — C189 Malignant neoplasm of colon, unspecified: Secondary | ICD-10-CM

## 2014-06-09 DIAGNOSIS — J029 Acute pharyngitis, unspecified: Secondary | ICD-10-CM

## 2014-06-09 DIAGNOSIS — Z418 Encounter for other procedures for purposes other than remedying health state: Secondary | ICD-10-CM

## 2014-06-09 DIAGNOSIS — F17210 Nicotine dependence, cigarettes, uncomplicated: Secondary | ICD-10-CM

## 2014-06-09 DIAGNOSIS — R197 Diarrhea, unspecified: Secondary | ICD-10-CM

## 2014-06-09 DIAGNOSIS — R350 Frequency of micturition: Secondary | ICD-10-CM

## 2014-06-09 DIAGNOSIS — Z5111 Encounter for antineoplastic chemotherapy: Secondary | ICD-10-CM | POA: Diagnosis not present

## 2014-06-09 DIAGNOSIS — Z7901 Long term (current) use of anticoagulants: Secondary | ICD-10-CM

## 2014-06-09 DIAGNOSIS — Z86718 Personal history of other venous thrombosis and embolism: Secondary | ICD-10-CM

## 2014-06-09 LAB — COMPREHENSIVE METABOLIC PANEL
ALBUMIN: 3.5 g/dL (ref 3.5–5.0)
ALT: 18 U/L (ref 17–63)
AST: 21 U/L (ref 15–41)
Alkaline Phosphatase: 103 U/L (ref 38–126)
Anion gap: 6 (ref 5–15)
BUN: 20 mg/dL (ref 6–20)
CO2: 26 mmol/L (ref 22–32)
Calcium: 8.9 mg/dL (ref 8.9–10.3)
Chloride: 103 mmol/L (ref 101–111)
Creatinine, Ser: 1.09 mg/dL (ref 0.61–1.24)
GFR calc non Af Amer: >60 mL/min (ref >60)
Glucose, Bld: 103 mg/dL — ABNORMAL HIGH (ref 65–99)
Potassium: 3.6 mmol/L (ref 3.5–5.1)
SODIUM: 135 mmol/L (ref 135–145)
TOTAL PROTEIN: 6.6 g/dL (ref 6.5–8.1)
Total Bilirubin: 0.8 mg/dL (ref 0.3–1.2)

## 2014-06-09 LAB — CBC WITH DIFFERENTIAL/PLATELET
BASOS ABS: 0 10*3/uL (ref 0–0.1)
BASOS PCT: 1 %
Eosinophils Absolute: 0.1 10*3/uL (ref 0–0.7)
Eosinophils Relative: 1 %
HCT: 37.7 % — ABNORMAL LOW (ref 40.0–52.0)
Hemoglobin: 12.2 g/dL — ABNORMAL LOW (ref 13.0–18.0)
Lymphocytes Relative: 18 %
Lymphs Abs: 1.5 10*3/uL (ref 1.0–3.6)
MCH: 28.3 pg (ref 26.0–34.0)
MCHC: 32.2 g/dL (ref 32.0–36.0)
MCV: 87.7 fL (ref 80.0–100.0)
MONO ABS: 0.6 10*3/uL (ref 0.2–1.0)
Monocytes Relative: 7 %
NEUTROS ABS: 6.2 10*3/uL (ref 1.4–6.5)
Neutrophils Relative %: 73 %
PLATELETS: 124 10*3/uL — AB (ref 150–440)
RBC: 4.3 MIL/uL — ABNORMAL LOW (ref 4.40–5.90)
RDW: 19.9 % — AB (ref 11.5–14.5)
WBC: 8.5 10*3/uL (ref 3.8–10.6)

## 2014-06-09 MED ORDER — HEPARIN SOD (PORK) LOCK FLUSH 100 UNIT/ML IV SOLN: 500.0000 [IU] | Freq: Once | INTRAVENOUS | Status: DC | PRN

## 2014-06-09 MED ORDER — FLUOROURACIL CHEMO INJECTION 2.5 GM/50ML
400.0000 mg/m2 | Freq: Once | INTRAVENOUS | Status: AC
  Administered 2014-06-09: 900 mg via INTRAVENOUS
  Filled 2014-06-09: qty 18

## 2014-06-09 MED ORDER — SODIUM CHLORIDE 0.9 % IV SOLN
2400.0000 mg/m2 | INTRAVENOUS | Status: DC
  Administered 2014-06-09: 5500 mg via INTRAVENOUS
  Filled 2014-06-09: qty 90

## 2014-06-09 MED ORDER — SODIUM CHLORIDE 0.9 % IJ SOLN
10.0000 mL | INTRAMUSCULAR | Status: DC | PRN
  Administered 2014-06-09: 10 mL
  Filled 2014-06-09: qty 10

## 2014-06-09 MED ORDER — OXALIPLATIN CHEMO INJECTION 100 MG/20ML
85.0000 mg/m2 | Freq: Once | INTRAVENOUS | Status: AC
  Administered 2014-06-09: 195 mg via INTRAVENOUS
  Filled 2014-06-09: qty 39

## 2014-06-09 MED ORDER — LEUCOVORIN CALCIUM INJECTION 350 MG
400.0000 mg/m2 | Freq: Once | INTRAVENOUS | Status: AC
  Administered 2014-06-09: 920 mg via INTRAVENOUS
  Filled 2014-06-09: qty 28.5

## 2014-06-09 MED ORDER — SODIUM CHLORIDE 0.9 % IV SOLN
Freq: Once | INTRAVENOUS | Status: AC
  Administered 2014-06-09: 12:00:00 via INTRAVENOUS
  Filled 2014-06-09: qty 5

## 2014-06-09 MED ORDER — DEXTROSE 5 % IV SOLN
Freq: Once | INTRAVENOUS | Status: AC
  Administered 2014-06-09: 12:00:00 via INTRAVENOUS
  Filled 2014-06-09: qty 1000

## 2014-06-09 MED ORDER — PALONOSETRON HCL INJECTION 0.25 MG/5ML
0.2500 mg | Freq: Once | INTRAVENOUS | Status: AC
  Administered 2014-06-09: 0.25 mg via INTRAVENOUS
  Filled 2014-06-09: qty 5

## 2014-06-11 ENCOUNTER — Inpatient Hospital Stay: Payer: Medicaid Other

## 2014-06-11 ENCOUNTER — Ambulatory Visit: Payer: Self-pay

## 2014-06-11 ENCOUNTER — Other Ambulatory Visit: Payer: Self-pay | Admitting: Oncology

## 2014-06-11 VITALS — BP 144/90 | HR 64 | Temp 95.9°F | Resp 20

## 2014-06-11 DIAGNOSIS — Z5111 Encounter for antineoplastic chemotherapy: Secondary | ICD-10-CM | POA: Diagnosis not present

## 2014-06-11 DIAGNOSIS — I82409 Acute embolism and thrombosis of unspecified deep veins of unspecified lower extremity: Secondary | ICD-10-CM

## 2014-06-11 DIAGNOSIS — C189 Malignant neoplasm of colon, unspecified: Secondary | ICD-10-CM

## 2014-06-11 LAB — PROTIME-INR
INR: 1.11
Prothrombin Time: 14.5 seconds (ref 11.4–15.0)

## 2014-06-11 MED ORDER — SODIUM CHLORIDE 0.9 % IJ SOLN
10.0000 mL | INTRAMUSCULAR | Status: DC | PRN
  Administered 2014-06-11: 10 mL
  Filled 2014-06-11: qty 10

## 2014-06-11 MED ORDER — HEPARIN SOD (PORK) LOCK FLUSH 100 UNIT/ML IV SOLN
500.0000 [IU] | Freq: Once | INTRAVENOUS | Status: AC | PRN
  Administered 2014-06-11: 500 [IU]
  Filled 2014-06-11: qty 5

## 2014-06-11 MED ORDER — PEGFILGRASTIM INJECTION 6 MG/0.6ML ~~LOC~~
6.0000 mg | PREFILLED_SYRINGE | Freq: Once | SUBCUTANEOUS | Status: AC
  Administered 2014-06-11: 6 mg via SUBCUTANEOUS
  Filled 2014-06-11: qty 0.6

## 2014-06-16 ENCOUNTER — Other Ambulatory Visit: Payer: Self-pay | Admitting: *Deleted

## 2014-06-16 ENCOUNTER — Inpatient Hospital Stay: Payer: Medicaid Other

## 2014-06-16 DIAGNOSIS — C189 Malignant neoplasm of colon, unspecified: Secondary | ICD-10-CM

## 2014-06-16 DIAGNOSIS — I82409 Acute embolism and thrombosis of unspecified deep veins of unspecified lower extremity: Secondary | ICD-10-CM

## 2014-06-16 DIAGNOSIS — Z5111 Encounter for antineoplastic chemotherapy: Secondary | ICD-10-CM | POA: Diagnosis not present

## 2014-06-16 LAB — CBC WITH DIFFERENTIAL/PLATELET
Basophils Absolute: 0 10*3/uL (ref 0–0.1)
Eosinophils Absolute: 0.1 10*3/uL (ref 0–0.7)
HEMATOCRIT: 38.1 % — AB (ref 40.0–52.0)
Hemoglobin: 12.4 g/dL — ABNORMAL LOW (ref 13.0–18.0)
Lymphocytes Relative: 16 %
Lymphs Abs: 1.2 10*3/uL (ref 1.0–3.6)
MCH: 29 pg (ref 26.0–34.0)
MCHC: 32.7 g/dL (ref 32.0–36.0)
MCV: 89 fL (ref 80.0–100.0)
Monocytes Absolute: 0.6 10*3/uL (ref 0.2–1.0)
Monocytes Relative: 8 %
Neutro Abs: 5.7 10*3/uL (ref 1.4–6.5)
Neutrophils Relative %: 75 %
Platelets: 95 10*3/uL — ABNORMAL LOW (ref 150–440)
RBC: 4.28 MIL/uL — ABNORMAL LOW (ref 4.40–5.90)
RDW: 19.2 % — ABNORMAL HIGH (ref 11.5–14.5)
WBC: 7.7 10*3/uL (ref 3.8–10.6)

## 2014-06-16 LAB — COMPREHENSIVE METABOLIC PANEL
ALK PHOS: 121 U/L (ref 38–126)
ALT: 24 U/L (ref 17–63)
AST: 30 U/L (ref 15–41)
Albumin: 3.8 g/dL (ref 3.5–5.0)
Anion gap: 7 (ref 5–15)
BUN: 14 mg/dL (ref 6–20)
CALCIUM: 8.8 mg/dL — AB (ref 8.9–10.3)
CO2: 22 mmol/L (ref 22–32)
CREATININE: 1.22 mg/dL (ref 0.61–1.24)
Chloride: 103 mmol/L (ref 101–111)
GFR calc Af Amer: >60 mL/min (ref >60)
Glucose, Bld: 136 mg/dL — ABNORMAL HIGH (ref 65–99)
Potassium: 3.3 mmol/L — ABNORMAL LOW (ref 3.5–5.1)
SODIUM: 132 mmol/L — AB (ref 135–145)
Total Bilirubin: 0.8 mg/dL (ref 0.3–1.2)
Total Protein: 7 g/dL (ref 6.5–8.1)

## 2014-06-16 LAB — PROTIME-INR
INR: 2.09
Prothrombin Time: 23.6 seconds — ABNORMAL HIGH (ref 11.4–15.0)

## 2014-06-19 ENCOUNTER — Telehealth: Payer: Self-pay

## 2014-06-19 ENCOUNTER — Telehealth: Payer: Self-pay | Admitting: *Deleted

## 2014-06-19 ENCOUNTER — Other Ambulatory Visit: Payer: Self-pay | Admitting: Oncology

## 2014-06-19 ENCOUNTER — Inpatient Hospital Stay: Payer: Medicaid Other

## 2014-06-19 DIAGNOSIS — I82409 Acute embolism and thrombosis of unspecified deep veins of unspecified lower extremity: Secondary | ICD-10-CM

## 2014-06-19 DIAGNOSIS — C189 Malignant neoplasm of colon, unspecified: Secondary | ICD-10-CM

## 2014-06-19 DIAGNOSIS — Z5111 Encounter for antineoplastic chemotherapy: Secondary | ICD-10-CM | POA: Diagnosis not present

## 2014-06-19 LAB — URINALYSIS COMPLETE WITH MICROSCOPIC (ARMC ONLY)
BILIRUBIN URINE: NEGATIVE
Bacteria, UA: NONE SEEN
Glucose, UA: NEGATIVE mg/dL
Nitrite: NEGATIVE
Protein, ur: 100 mg/dL — AB
Specific Gravity, Urine: 1.029 (ref 1.005–1.030)
Squamous Epithelial / LPF: NONE SEEN
pH: 6 (ref 5.0–8.0)

## 2014-06-19 LAB — PROTIME-INR
INR: 3.78
PROTHROMBIN TIME: 37.3 s — AB (ref 11.4–15.0)

## 2014-06-19 MED ORDER — LEVOFLOXACIN 500 MG PO TABS: 500.0000 mg | ORAL_TABLET | Freq: Every day | ORAL | Status: DC

## 2014-06-19 NOTE — Progress Notes (Signed)
This encounter was created in error - please disregard.

## 2014-06-19 NOTE — Telephone Encounter (Signed)
I called pt with his PT/INR results INR 3.78. Dr Grayland Ormond said for pt to hold his coumadin today if he has not already taken it, tomm and Sunday. Pt said he is havinf some blood in his urine. The culture results are not back. Pt states he is having flank pain and testicle pain with urination and walking. Instructed pt to drink extra water, to really push the water and cranberry juice. Dr Grayland Ormond has escribed Levaquin to his pharmacy. Instructed pt to take tylenol for pain.  Pt repeated instructions back to me correctly.

## 2014-06-19 NOTE — Telephone Encounter (Signed)
Patient started having dysuria 4 days ago and started hematuria today.  I spoke with Dr. Grayland Ormond and he wants him to come in for urinalysis also check PT/INR since patient is currently on Coumadin.  Scheduling message and lab orders entered./Keijuan Schellhase S

## 2014-06-21 LAB — URINE CULTURE

## 2014-06-23 ENCOUNTER — Other Ambulatory Visit: Payer: Self-pay | Admitting: Oncology

## 2014-06-23 ENCOUNTER — Ambulatory Visit: Payer: Self-pay

## 2014-06-23 ENCOUNTER — Other Ambulatory Visit: Payer: Self-pay

## 2014-06-23 ENCOUNTER — Ambulatory Visit: Payer: Self-pay | Admitting: Oncology

## 2014-06-24 ENCOUNTER — Inpatient Hospital Stay: Payer: Medicaid Other

## 2014-06-24 ENCOUNTER — Inpatient Hospital Stay: Payer: Medicaid Other | Attending: Oncology

## 2014-06-24 ENCOUNTER — Inpatient Hospital Stay (HOSPITAL_BASED_OUTPATIENT_CLINIC_OR_DEPARTMENT_OTHER): Payer: Medicaid Other | Admitting: Oncology

## 2014-06-24 VITALS — BP 158/95 | HR 73 | Temp 96.2°F | Resp 18 | Wt 223.5 lb

## 2014-06-24 DIAGNOSIS — Z800 Family history of malignant neoplasm of digestive organs: Secondary | ICD-10-CM

## 2014-06-24 DIAGNOSIS — C189 Malignant neoplasm of colon, unspecified: Secondary | ICD-10-CM

## 2014-06-24 DIAGNOSIS — F17210 Nicotine dependence, cigarettes, uncomplicated: Secondary | ICD-10-CM | POA: Diagnosis not present

## 2014-06-24 DIAGNOSIS — I82409 Acute embolism and thrombosis of unspecified deep veins of unspecified lower extremity: Secondary | ICD-10-CM | POA: Insufficient documentation

## 2014-06-24 DIAGNOSIS — Z418 Encounter for other procedures for purposes other than remedying health state: Secondary | ICD-10-CM

## 2014-06-24 DIAGNOSIS — N50819 Testicular pain, unspecified: Secondary | ICD-10-CM

## 2014-06-24 DIAGNOSIS — R197 Diarrhea, unspecified: Secondary | ICD-10-CM | POA: Diagnosis not present

## 2014-06-24 DIAGNOSIS — Z79899 Other long term (current) drug therapy: Secondary | ICD-10-CM | POA: Diagnosis not present

## 2014-06-24 DIAGNOSIS — Z86718 Personal history of other venous thrombosis and embolism: Secondary | ICD-10-CM | POA: Insufficient documentation

## 2014-06-24 DIAGNOSIS — Z5111 Encounter for antineoplastic chemotherapy: Secondary | ICD-10-CM | POA: Insufficient documentation

## 2014-06-24 DIAGNOSIS — Z7901 Long term (current) use of anticoagulants: Secondary | ICD-10-CM

## 2014-06-24 DIAGNOSIS — Z9049 Acquired absence of other specified parts of digestive tract: Secondary | ICD-10-CM | POA: Diagnosis not present

## 2014-06-24 DIAGNOSIS — N508 Other specified disorders of male genital organs: Secondary | ICD-10-CM | POA: Diagnosis not present

## 2014-06-24 LAB — COMPREHENSIVE METABOLIC PANEL
ALBUMIN: 3.4 g/dL — AB (ref 3.5–5.0)
ALK PHOS: 105 U/L (ref 38–126)
ALT: 30 U/L (ref 17–63)
AST: 30 U/L (ref 15–41)
Anion gap: 5 (ref 5–15)
BUN: 17 mg/dL (ref 6–20)
CALCIUM: 8.7 mg/dL — AB (ref 8.9–10.3)
CO2: 26 mmol/L (ref 22–32)
Chloride: 108 mmol/L (ref 101–111)
Creatinine, Ser: 1.07 mg/dL (ref 0.61–1.24)
GFR calc non Af Amer: >60 mL/min (ref >60)
Glucose, Bld: 105 mg/dL — ABNORMAL HIGH (ref 65–99)
Potassium: 3.4 mmol/L — ABNORMAL LOW (ref 3.5–5.1)
Sodium: 139 mmol/L (ref 135–145)
Total Bilirubin: 0.5 mg/dL (ref 0.3–1.2)
Total Protein: 6.8 g/dL (ref 6.5–8.1)

## 2014-06-24 LAB — CBC WITH DIFFERENTIAL/PLATELET
BASOS ABS: 0.1 10*3/uL (ref 0–0.1)
BASOS PCT: 1 %
EOS ABS: 0.1 10*3/uL (ref 0–0.7)
Eosinophils Relative: 2 %
HCT: 36.6 % — ABNORMAL LOW (ref 40.0–52.0)
Hemoglobin: 11.8 g/dL — ABNORMAL LOW (ref 13.0–18.0)
Lymphocytes Relative: 26 %
Lymphs Abs: 1.4 10*3/uL (ref 1.0–3.6)
MCH: 28.4 pg (ref 26.0–34.0)
MCHC: 32.2 g/dL (ref 32.0–36.0)
MCV: 88.1 fL (ref 80.0–100.0)
Monocytes Absolute: 0.5 10*3/uL (ref 0.2–1.0)
Monocytes Relative: 10 %
NEUTROS PCT: 61 %
Neutro Abs: 3.3 10*3/uL (ref 1.4–6.5)
Platelets: 145 10*3/uL — ABNORMAL LOW (ref 150–440)
RBC: 4.16 MIL/uL — ABNORMAL LOW (ref 4.40–5.90)
RDW: 19 % — ABNORMAL HIGH (ref 11.5–14.5)
WBC: 5.4 10*3/uL (ref 3.8–10.6)

## 2014-06-24 LAB — PROTIME-INR
INR: 2.95
Prothrombin Time: 30.8 seconds — ABNORMAL HIGH (ref 11.4–15.0)

## 2014-06-24 MED ORDER — HEPARIN SOD (PORK) LOCK FLUSH 100 UNIT/ML IV SOLN: 500.0000 [IU] | Freq: Once | INTRAVENOUS | Status: DC | PRN

## 2014-06-24 MED ORDER — FLUOROURACIL CHEMO INJECTION 5 GM/100ML
2400.0000 mg/m2 | INTRAVENOUS | Status: DC
  Administered 2014-06-24: 5500 mg via INTRAVENOUS
  Filled 2014-06-24: qty 110

## 2014-06-24 MED ORDER — OXALIPLATIN CHEMO INJECTION 100 MG/20ML
85.0000 mg/m2 | Freq: Once | INTRAVENOUS | Status: AC
  Administered 2014-06-24: 195 mg via INTRAVENOUS
  Filled 2014-06-24: qty 39

## 2014-06-24 MED ORDER — FLUOROURACIL CHEMO INJECTION 2.5 GM/50ML
400.0000 mg/m2 | Freq: Once | INTRAVENOUS | Status: AC
  Administered 2014-06-24: 900 mg via INTRAVENOUS
  Filled 2014-06-24: qty 18

## 2014-06-24 MED ORDER — DEXTROSE 5 % IV SOLN
Freq: Once | INTRAVENOUS | Status: AC
  Administered 2014-06-24: 11:00:00 via INTRAVENOUS
  Filled 2014-06-24: qty 1000

## 2014-06-24 MED ORDER — SODIUM CHLORIDE 0.9 % IV SOLN
Freq: Once | INTRAVENOUS | Status: AC
  Administered 2014-06-24: 11:00:00 via INTRAVENOUS
  Filled 2014-06-24: qty 5

## 2014-06-24 MED ORDER — LEUCOVORIN CALCIUM INJECTION 350 MG
400.0000 mg/m2 | Freq: Once | INTRAVENOUS | Status: AC
  Administered 2014-06-24: 920 mg via INTRAVENOUS
  Filled 2014-06-24: qty 46

## 2014-06-24 MED ORDER — SODIUM CHLORIDE 0.9 % IJ SOLN
10.0000 mL | INTRAMUSCULAR | Status: DC | PRN
  Administered 2014-06-24: 10 mL
  Filled 2014-06-24: qty 10

## 2014-06-24 MED ORDER — PALONOSETRON HCL INJECTION 0.25 MG/5ML
0.2500 mg | Freq: Once | INTRAVENOUS | Status: AC
Start: 2014-06-24 — End: 2014-06-24
  Administered 2014-06-24: 0.25 mg via INTRAVENOUS
  Filled 2014-06-24: qty 5

## 2014-06-25 NOTE — Progress Notes (Signed)
[place]  Telephone:(336) [phone] Fax:(336) [phone]  ID: James Keller OB: January 01, 1951  MR#: [id number]  YTK#:[id number]  Patient Care Team: Steele Sizer, MD as PCP - General (Family Medicine)  CHIEF COMPLAINT:  Chief Complaint  Patient presents with  . Follow-up    colon cancer  . Chemotherapy    INTERVAL HISTORY: Patient returns to clinic today for further evaluation and consideration of cycle 9 of 12 of FOLFOX. He feels well and is asymptomatic. He denies any fevers. He has no new neurologic complaints. He has no chest pain or shortness of breath. He denies any weight loss. He denies any easy bleeding or bruising. He has no nausea, vomiting, or constipation. Patient offers no specific complaints today.   REVIEW OF SYSTEMS:   Review of Systems  Constitutional: Negative.   Gastrointestinal: Negative.     As per HPI. Otherwise, a complete review of systems is negatve.  PAST MEDICAL HISTORY: Past Medical History  Diagnosis Date  . Colon cancer   . DVT (deep venous thrombosis)     PAST SURGICAL HISTORY: Past Surgical History  Procedure Laterality Date  . Colon resection  12/19/2013    FAMILY HISTORY: Grandmother with "liver cancer".     ADVANCED DIRECTIVES:    HEALTH MAINTENANCE: History  Substance Use Topics  . Smoking status: Current Every Day Smoker -- 1.00 packs/day for 54 years    Types: Cigarettes  . Smokeless tobacco: Not on file  . Alcohol Use: Yes     Comment: moderate     Colonoscopy:  PAP:  Bone density:  Lipid panel:  Allergies  Allergen Reactions  . No Known Allergies     Current Outpatient Prescriptions  Medication Sig Dispense Refill  . metoprolol tartrate (LOPRESSOR) 25 MG tablet Take 25 mg by mouth 2 (two) times daily.    Marland Kitchen warfarin (COUMADIN) 5 MG tablet Take 7.5 mg by mouth daily. 1.5 tabs orally once a day, as directed.  Held last Friday, Saturday & Sunday.  Then resumed 1.5 tab    .  levofloxacin (LEVAQUIN) 500 MG tablet Take 1 tablet (500 mg total) by mouth daily. 7 tablet 0   No current facility-administered medications for this visit.    OBJECTIVE: Filed Vitals:   06/09/14 1102  BP: 151/84  Pulse: 76  Temp: 97.2 F (36.2 C)  Resp: 18     Body mass index is 30.51 kg/(m^2).    ECOG FS:0 - Asymptomatic  General: Well-developed, well-nourished, no acute distress. Eyes: anicteric sclera. Lungs: Clear to auscultation bilaterally. Heart: Regular rate and rhythm. No rubs, murmurs, or gallops. Abdomen: Soft, nontender, nondistended. No organomegaly noted, normoactive bowel sounds. Musculoskeletal: No edema, cyanosis, or clubbing. Neuro: Alert, answering all questions appropriately. Cranial nerves grossly intact. Skin: No rashes or petechiae noted. Psych: Normal affect.    LAB RESULTS:  Lab Results  Component Value Date   NA 139 06/24/2014   K 3.4* 06/24/2014   CL 108 06/24/2014   CO2 26 06/24/2014   GLUCOSE 105* 06/24/2014   BUN 17 06/24/2014   CREATININE 1.07 06/24/2014   CALCIUM 8.7* 06/24/2014   PROT 6.8 06/24/2014   ALBUMIN 3.4* 06/24/2014   AST 30 06/24/2014   ALT 30 06/24/2014   ALKPHOS 105 06/24/2014   BILITOT 0.5 06/24/2014   GFRNONAA >60 06/24/2014   GFRAA >60 06/24/2014    Lab Results  Component Value Date   WBC 5.4 06/24/2014   NEUTROABS 3.3 06/24/2014   HGB 11.8* 06/24/2014  HCT 36.6* 06/24/2014   MCV 88.1 06/24/2014   PLT 145* 06/24/2014     STUDIES: No results found.  ASSESSMENT: Stage IIIB colon cancer, DVT.  PLAN:    1. Colon cancer: Preop CEA was elevated at 69.5, it is now within normal limits. Proceed with cycle 9 of 12 of adjuvant FOLFOX today. Patient will receive Neulasta with the remainder of his treatments. Return to clinic in 2 days for pump removal and Neulasta and in 2 weeks for consideration of cycle 10. 2. DVT: Patient could not afford Xarelto and was initiated on Coumadin. INR pending at time of  dictation.  Countinue at 7.5 mg daily M-F. Patient will require 6 months of treatment.  3. Elevated creatinine: Patient creatinine is now within normal limits. 4. Hypokalemia: Potassium now within normal limits, monitor. 5. Diarrhea: Improving. Continue Lomotil as needed.   Patient expressed understanding and was in agreement with this plan. He also understands that He can call clinic at any time with any questions, concerns, or complaints.   Colon cancer   Staging form: Neuroendocrine Tumor - Colon/Rectum, AJCC 7th Edition     Clinical stage from 05/04/2014: Stage IIIB (T4, N1, M0) - Signed by Lloyd Huger, MD on 05/04/2014   Lloyd Huger, MD   06/25/2014 4:06 PM

## 2014-06-25 NOTE — Progress Notes (Signed)
[place]  Telephone:(336) [phone] [phone]) [phone]  ID: James Keller OB: 1950/11/11  MR#: [id number]  QVZ#:[id number]  Patient Care Team: Steele Sizer, MD as PCP - General (Family Medicine)  CHIEF COMPLAINT:  Chief Complaint  Patient presents with  . Follow-up    Colon cancer    INTERVAL HISTORY: Patient returns to clinic today for further evaluation and consideration of cycle 10 of 12 of FOLFOX. He had urinary frequency and hematuria earlier this week which resolved within about it. He continues to have testicular pain and swelling. He otherwise feels well and is asymptomatic. He denies any fevers. He has no new neurologic complaints. He has no chest pain or shortness of breath. He denies any weight loss. He denies any easy bleeding or bruising. He has no nausea, vomiting, or constipation. Patient offers no specific complaints today.   REVIEW OF SYSTEMS:   Review of Systems  Constitutional: Negative.   Genitourinary: Negative for urgency, frequency and hematuria.       Testicular pain    As per HPI. Otherwise, a complete review of systems is negatve.  PAST MEDICAL HISTORY: Past Medical History  Diagnosis Date  . Colon cancer   . DVT (deep venous thrombosis)     PAST SURGICAL HISTORY: Past Surgical History  Procedure Laterality Date  . Colon resection  12/19/2013    FAMILY HISTORY: Grandmother with "liver cancer".     ADVANCED DIRECTIVES:    HEALTH MAINTENANCE: History  Substance Use Topics  . Smoking status: Current Every Day Smoker -- 1.00 packs/day for 54 years    Types: Cigarettes  . Smokeless tobacco: Not on file  . Alcohol Use: Yes     Comment: moderate     Colonoscopy:  PAP:  Bone density:  Lipid panel:  Allergies  Allergen Reactions  . No Known Allergies     Current Outpatient Prescriptions  Medication Sig Dispense Refill  . levofloxacin (LEVAQUIN) 500 MG tablet Take 1 tablet (500 mg total) by mouth  daily. 7 tablet 0  . metoprolol tartrate (LOPRESSOR) 25 MG tablet Take 25 mg by mouth 2 (two) times daily.    Marland Kitchen warfarin (COUMADIN) 5 MG tablet Take 7.5 mg by mouth daily. 1.5 tabs orally once a day, as directed.  Held last Friday, Saturday & Sunday.  Then resumed 1.5 tab     No current facility-administered medications for this visit.    OBJECTIVE: Filed Vitals:   06/24/14 1026  BP: 158/95  Pulse: 73  Temp: 96.2 F (35.7 C)  Resp: 18     Body mass index is 29.95 kg/(m^2).    ECOG FS:0 - Asymptomatic  General: Well-developed, well-nourished, no acute distress. Eyes: anicteric sclera. Lungs: Clear to auscultation bilaterally. Heart: Regular rate and rhythm. No rubs, murmurs, or gallops. Abdomen: Soft, nontender, nondistended. No organomegaly noted, normoactive bowel sounds. Musculoskeletal: No edema, cyanosis, or clubbing. Neuro: Alert, answering all questions appropriately. Cranial nerves grossly intact. Skin: No rashes or petechiae noted. Psych: Normal affect.    LAB RESULTS:  Lab Results  Component Value Date   NA 139 06/24/2014   K 3.4* 06/24/2014   CL 108 06/24/2014   CO2 26 06/24/2014   GLUCOSE 105* 06/24/2014   BUN 17 06/24/2014   CREATININE 1.07 06/24/2014   CALCIUM 8.7* 06/24/2014   PROT 6.8 06/24/2014   ALBUMIN 3.4* 06/24/2014   AST 30 06/24/2014   ALT 30 06/24/2014   ALKPHOS 105 06/24/2014   BILITOT 0.5 06/24/2014   GFRNONAA >60  06/24/2014   GFRAA >60 06/24/2014    Lab Results  Component Value Date   WBC 5.4 06/24/2014   NEUTROABS 3.3 06/24/2014   HGB 11.8* 06/24/2014   HCT 36.6* 06/24/2014   MCV 88.1 06/24/2014   PLT 145* 06/24/2014     STUDIES: No results found.  ASSESSMENT: Stage IIIB colon cancer, DVT.  PLAN:    1. Colon cancer: Preop CEA was elevated at 69.5, it is now within normal limits. Proceed with cycle 10 of 12 of adjuvant FOLFOX today. Patient will receive Neulasta with the remainder of his treatments. Return to clinic in 2  days for pump removal and Neulasta and in 2 weeks for consideration of cycle 10. 2. DVT: Patient could not afford Xarelto and was initiated on Coumadin. Countinue at 7.5 mg daily M-F. Patient will require 6 months of treatment.  3. Elevated creatinine: Patient creatinine is now within normal limits. 4. Hypokalemia: Potassium now within normal limits, monitor. 5. Diarrhea: Improving. Continue Lomotil as needed. 6. Testicular pain: Possible epididymitis. We will get scrotal ultrasound to further evaluate.   Patient expressed understanding and was in agreement with this plan. He also understands that He can call clinic at any time with any questions, concerns, or complaints.   Colon cancer   Staging form: Neuroendocrine Tumor - Colon/Rectum, AJCC 7th Edition     Clinical stage from 05/04/2014: Stage IIIB (T4, N1, M0) - Signed by Lloyd Huger, MD on 05/04/2014   Lloyd Huger, MD   06/25/2014 4:10 PM

## 2014-06-26 ENCOUNTER — Inpatient Hospital Stay: Payer: Medicaid Other

## 2014-06-26 ENCOUNTER — Encounter (INDEPENDENT_AMBULATORY_CARE_PROVIDER_SITE_OTHER): Payer: Self-pay

## 2014-06-26 DIAGNOSIS — C189 Malignant neoplasm of colon, unspecified: Secondary | ICD-10-CM

## 2014-06-26 DIAGNOSIS — Z5111 Encounter for antineoplastic chemotherapy: Secondary | ICD-10-CM | POA: Diagnosis not present

## 2014-06-26 MED ORDER — PEGFILGRASTIM INJECTION 6 MG/0.6ML ~~LOC~~
6.0000 mg | PREFILLED_SYRINGE | Freq: Once | SUBCUTANEOUS | Status: AC
  Administered 2014-06-26: 6 mg via SUBCUTANEOUS
  Filled 2014-06-26: qty 0.6

## 2014-06-26 MED ORDER — SODIUM CHLORIDE 0.9 % IJ SOLN
10.0000 mL | INTRAMUSCULAR | Status: DC | PRN
  Administered 2014-06-26: 10 mL
  Filled 2014-06-26: qty 10

## 2014-06-26 MED ORDER — HEPARIN SOD (PORK) LOCK FLUSH 100 UNIT/ML IV SOLN
500.0000 [IU] | Freq: Once | INTRAVENOUS | Status: AC | PRN
  Administered 2014-06-26: 500 [IU]
  Filled 2014-06-26: qty 5

## 2014-06-29 ENCOUNTER — Ambulatory Visit
Admission: RE | Admit: 2014-06-29 | Discharge: 2014-06-29 | Disposition: A | Discharge status: Home / Self Care | Payer: Medicaid Other | Source: Ambulatory Visit | Attending: Oncology | Admitting: Oncology

## 2014-06-29 DIAGNOSIS — N50819 Testicular pain, unspecified: Secondary | ICD-10-CM

## 2014-06-29 DIAGNOSIS — N508 Other specified disorders of male genital organs: Secondary | ICD-10-CM | POA: Diagnosis present

## 2014-07-01 ENCOUNTER — Inpatient Hospital Stay: Payer: Medicaid Other

## 2014-07-01 ENCOUNTER — Telehealth: Payer: Self-pay

## 2014-07-01 DIAGNOSIS — C189 Malignant neoplasm of colon, unspecified: Secondary | ICD-10-CM

## 2014-07-01 DIAGNOSIS — Z5111 Encounter for antineoplastic chemotherapy: Secondary | ICD-10-CM | POA: Diagnosis not present

## 2014-07-01 LAB — COMPREHENSIVE METABOLIC PANEL
ALT: 31 U/L (ref 17–63)
AST: 34 U/L (ref 15–41)
Albumin: 3.8 g/dL (ref 3.5–5.0)
Alkaline Phosphatase: 137 U/L — ABNORMAL HIGH (ref 38–126)
Anion gap: 8 (ref 5–15)
BILIRUBIN TOTAL: 0.5 mg/dL (ref 0.3–1.2)
BUN: 14 mg/dL (ref 6–20)
CALCIUM: 9.2 mg/dL (ref 8.9–10.3)
CO2: 25 mmol/L (ref 22–32)
CREATININE: 1.26 mg/dL — AB (ref 0.61–1.24)
Chloride: 106 mmol/L (ref 101–111)
GFR, EST NON AFRICAN AMERICAN: 59 mL/min — AB (ref >60)
Glucose, Bld: 94 mg/dL (ref 65–99)
Potassium: 3.6 mmol/L (ref 3.5–5.1)
SODIUM: 139 mmol/L (ref 135–145)
Total Protein: 7 g/dL (ref 6.5–8.1)

## 2014-07-01 LAB — CBC WITH DIFFERENTIAL/PLATELET
BASOS PCT: 0 %
Basophils Absolute: 0 10*3/uL (ref 0–0.1)
EOS PCT: 2 %
Eosinophils Absolute: 0.2 10*3/uL (ref 0–0.7)
HEMATOCRIT: 38.6 % — AB (ref 40.0–52.0)
Hemoglobin: 12.3 g/dL — ABNORMAL LOW (ref 13.0–18.0)
Lymphocytes Relative: 16 %
Lymphs Abs: 1.7 10*3/uL (ref 1.0–3.6)
MCH: 28.3 pg (ref 26.0–34.0)
MCHC: 31.8 g/dL — ABNORMAL LOW (ref 32.0–36.0)
MCV: 89 fL (ref 80.0–100.0)
MONOS PCT: 7 %
Monocytes Absolute: 0.8 10*3/uL (ref 0.2–1.0)
NEUTROS PCT: 75 %
Neutro Abs: 7.9 10*3/uL — ABNORMAL HIGH (ref 1.4–6.5)
PLATELETS: 116 10*3/uL — AB (ref 150–440)
RBC: 4.34 MIL/uL — AB (ref 4.40–5.90)
RDW: 19 % — ABNORMAL HIGH (ref 11.5–14.5)
WBC: 10.6 10*3/uL (ref 3.8–10.6)

## 2014-07-01 NOTE — Telephone Encounter (Signed)
Patient is wanting ultrasound results from Monday.

## 2014-07-02 ENCOUNTER — Telehealth: Payer: Self-pay | Admitting: *Deleted

## 2014-07-02 DIAGNOSIS — C189 Malignant neoplasm of colon, unspecified: Secondary | ICD-10-CM

## 2014-07-02 MED ORDER — LEVOFLOXACIN 500 MG PO TABS: 500.0000 mg | ORAL_TABLET | Freq: Every day | ORAL | Status: DC

## 2014-07-02 NOTE — Telephone Encounter (Signed)
Pt left message that wants to know results from test on Monday. Difficult to understand pt's name and did not leave DOB. Called pt back at number left on message [phone]) but number was disconnected. Attempted to call pt's home number and pt was not at home at the time of call.

## 2014-07-02 NOTE — Telephone Encounter (Addendum)
Pt wants to know results from ultrasound performed on Monday. Per Dr. Grayland Ormond, pt's ultrasound looks normal and can start on another week of oral antibiotics. Pt states continues to having swelling. Informed pt of results and that will be calling in antibiotics to pharmacy. Pt verbalized understanding.

## 2014-07-07 ENCOUNTER — Other Ambulatory Visit: Payer: Self-pay | Admitting: *Deleted

## 2014-07-07 DIAGNOSIS — I82409 Acute embolism and thrombosis of unspecified deep veins of unspecified lower extremity: Secondary | ICD-10-CM

## 2014-07-08 ENCOUNTER — Inpatient Hospital Stay: Payer: Medicaid Other

## 2014-07-08 ENCOUNTER — Telehealth: Payer: Self-pay | Admitting: *Deleted

## 2014-07-08 ENCOUNTER — Inpatient Hospital Stay (HOSPITAL_BASED_OUTPATIENT_CLINIC_OR_DEPARTMENT_OTHER): Payer: Medicaid Other | Admitting: Oncology

## 2014-07-08 VITALS — BP 155/92 | HR 52 | Temp 96.2°F | Resp 18 | Wt 214.1 lb

## 2014-07-08 DIAGNOSIS — N508 Other specified disorders of male genital organs: Secondary | ICD-10-CM | POA: Diagnosis not present

## 2014-07-08 DIAGNOSIS — Z418 Encounter for other procedures for purposes other than remedying health state: Secondary | ICD-10-CM

## 2014-07-08 DIAGNOSIS — C189 Malignant neoplasm of colon, unspecified: Secondary | ICD-10-CM

## 2014-07-08 DIAGNOSIS — Z7901 Long term (current) use of anticoagulants: Secondary | ICD-10-CM

## 2014-07-08 DIAGNOSIS — Z5111 Encounter for antineoplastic chemotherapy: Secondary | ICD-10-CM | POA: Diagnosis not present

## 2014-07-08 DIAGNOSIS — I82409 Acute embolism and thrombosis of unspecified deep veins of unspecified lower extremity: Secondary | ICD-10-CM

## 2014-07-08 DIAGNOSIS — F17210 Nicotine dependence, cigarettes, uncomplicated: Secondary | ICD-10-CM

## 2014-07-08 DIAGNOSIS — R197 Diarrhea, unspecified: Secondary | ICD-10-CM

## 2014-07-08 DIAGNOSIS — Z86718 Personal history of other venous thrombosis and embolism: Secondary | ICD-10-CM

## 2014-07-08 DIAGNOSIS — Z79899 Other long term (current) drug therapy: Secondary | ICD-10-CM

## 2014-07-08 DIAGNOSIS — Z800 Family history of malignant neoplasm of digestive organs: Secondary | ICD-10-CM

## 2014-07-08 DIAGNOSIS — Z9049 Acquired absence of other specified parts of digestive tract: Secondary | ICD-10-CM

## 2014-07-08 LAB — COMPREHENSIVE METABOLIC PANEL
ALT: 19 U/L (ref 17–63)
AST: 26 U/L (ref 15–41)
Albumin: 3.4 g/dL — ABNORMAL LOW (ref 3.5–5.0)
Alkaline Phosphatase: 103 U/L (ref 38–126)
Anion gap: 4 — ABNORMAL LOW (ref 5–15)
BILIRUBIN TOTAL: 0.7 mg/dL (ref 0.3–1.2)
BUN: 12 mg/dL (ref 6–20)
CO2: 25 mmol/L (ref 22–32)
Calcium: 8.5 mg/dL — ABNORMAL LOW (ref 8.9–10.3)
Chloride: 108 mmol/L (ref 101–111)
Creatinine, Ser: 1.36 mg/dL — ABNORMAL HIGH (ref 0.61–1.24)
GFR, EST NON AFRICAN AMERICAN: 54 mL/min — AB (ref >60)
Glucose, Bld: 107 mg/dL — ABNORMAL HIGH (ref 65–99)
Potassium: 3.6 mmol/L (ref 3.5–5.1)
SODIUM: 137 mmol/L (ref 135–145)
Total Protein: 6.7 g/dL (ref 6.5–8.1)

## 2014-07-08 LAB — CBC WITH DIFFERENTIAL/PLATELET
Basophils Absolute: 0.1 10*3/uL (ref 0–0.1)
Basophils Relative: 1 %
EOS ABS: 0.2 10*3/uL (ref 0–0.7)
Eosinophils Relative: 4 %
HCT: 35.9 % — ABNORMAL LOW (ref 40.0–52.0)
HEMOGLOBIN: 11.5 g/dL — AB (ref 13.0–18.0)
LYMPHS PCT: 19 %
Lymphs Abs: 1.1 10*3/uL (ref 1.0–3.6)
MCH: 28.8 pg (ref 26.0–34.0)
MCHC: 32.1 g/dL (ref 32.0–36.0)
MCV: 89.6 fL (ref 80.0–100.0)
MONOS PCT: 10 %
Monocytes Absolute: 0.6 10*3/uL (ref 0.2–1.0)
NEUTROS PCT: 66 %
Neutro Abs: 3.7 10*3/uL (ref 1.4–6.5)
Platelets: 102 10*3/uL — ABNORMAL LOW (ref 150–440)
RBC: 4.01 MIL/uL — AB (ref 4.40–5.90)
RDW: 19.7 % — ABNORMAL HIGH (ref 11.5–14.5)
WBC: 5.7 10*3/uL (ref 3.8–10.6)

## 2014-07-08 LAB — PROTIME-INR
INR: 9.42 — AB
Prothrombin Time: 75.4 seconds — ABNORMAL HIGH (ref 11.4–15.0)

## 2014-07-08 MED ORDER — SODIUM CHLORIDE 0.9 % IV SOLN
Freq: Once | INTRAVENOUS | Status: AC
  Administered 2014-07-08: 11:00:00 via INTRAVENOUS
  Filled 2014-07-08: qty 5

## 2014-07-08 MED ORDER — OXALIPLATIN CHEMO INJECTION 100 MG/20ML
85.0000 mg/m2 | Freq: Once | INTRAVENOUS | Status: AC
  Administered 2014-07-08: 195 mg via INTRAVENOUS
  Filled 2014-07-08: qty 39

## 2014-07-08 MED ORDER — SODIUM CHLORIDE 0.9 % IV SOLN
2400.0000 mg/m2 | INTRAVENOUS | Status: AC
  Administered 2014-07-08: 5500 mg via INTRAVENOUS
  Filled 2014-07-08: qty 110

## 2014-07-08 MED ORDER — HEPARIN SOD (PORK) LOCK FLUSH 100 UNIT/ML IV SOLN: 500.0000 [IU] | Freq: Once | INTRAVENOUS | Status: AC | PRN

## 2014-07-08 MED ORDER — DEXTROSE 5 % IV SOLN
Freq: Once | INTRAVENOUS | Status: AC
  Administered 2014-07-08: 11:00:00 via INTRAVENOUS
  Filled 2014-07-08: qty 1000

## 2014-07-08 MED ORDER — FLUOROURACIL CHEMO INJECTION 2.5 GM/50ML
400.0000 mg/m2 | Freq: Once | INTRAVENOUS | Status: AC
  Administered 2014-07-08: 900 mg via INTRAVENOUS
  Filled 2014-07-08: qty 18

## 2014-07-08 MED ORDER — SODIUM CHLORIDE 0.9 % IJ SOLN
10.0000 mL | INTRAMUSCULAR | Status: DC | PRN
  Administered 2014-07-08: 10 mL
  Filled 2014-07-08: qty 10

## 2014-07-08 MED ORDER — LEUCOVORIN CALCIUM INJECTION 350 MG
400.0000 mg/m2 | Freq: Once | INTRAMUSCULAR | Status: AC
  Administered 2014-07-08: 920 mg via INTRAVENOUS
  Filled 2014-07-08: qty 46

## 2014-07-08 MED ORDER — PALONOSETRON HCL INJECTION 0.25 MG/5ML
0.2500 mg | Freq: Once | INTRAVENOUS | Status: AC
  Administered 2014-07-08: 0.25 mg via INTRAVENOUS
  Filled 2014-07-08: qty 5

## 2014-07-08 NOTE — Progress Notes (Signed)
Patient has not noticed any hematuria since taking the antibiotics.  Patient advised of Coumadin changes and if he has any bleeding he should go to ER.

## 2014-07-08 NOTE — Telephone Encounter (Signed)
Sent to TF adn called to [employer], CMA

## 2014-07-10 ENCOUNTER — Inpatient Hospital Stay: Payer: Medicaid Other

## 2014-07-10 VITALS — BP 118/69 | HR 66 | Resp 20

## 2014-07-10 DIAGNOSIS — C189 Malignant neoplasm of colon, unspecified: Secondary | ICD-10-CM

## 2014-07-10 DIAGNOSIS — Z5111 Encounter for antineoplastic chemotherapy: Secondary | ICD-10-CM | POA: Diagnosis not present

## 2014-07-10 MED ORDER — SODIUM CHLORIDE 0.9 % IJ SOLN
10.0000 mL | INTRAMUSCULAR | Status: DC | PRN
  Administered 2014-07-10: 10 mL
  Filled 2014-07-10: qty 10

## 2014-07-10 MED ORDER — PEGFILGRASTIM INJECTION 6 MG/0.6ML ~~LOC~~
6.0000 mg | PREFILLED_SYRINGE | Freq: Once | SUBCUTANEOUS | Status: AC
  Administered 2014-07-10: 6 mg via SUBCUTANEOUS
  Filled 2014-07-10: qty 0.6

## 2014-07-10 MED ORDER — HEPARIN SOD (PORK) LOCK FLUSH 100 UNIT/ML IV SOLN
500.0000 [IU] | Freq: Once | INTRAVENOUS | Status: AC | PRN
  Administered 2014-07-10: 500 [IU]

## 2014-07-10 MED ORDER — HEPARIN SOD (PORK) LOCK FLUSH 100 UNIT/ML IV SOLN
INTRAVENOUS | Status: AC
  Filled 2014-07-10: qty 5

## 2014-07-10 NOTE — Progress Notes (Signed)
[place]  Telephone:(336) [phone] [phone]) [phone]  ID: James Keller OB: 1950/05/10  MR#: [id number]  AOZ#:[id number]  Patient Care Team: Steele Sizer, MD as PCP - General (Family Medicine)  CHIEF COMPLAINT:  Chief Complaint  Patient presents with  . Follow-up    colon cancer    INTERVAL HISTORY: Patient returns to clinic today for further evaluation and consideration of cycle 11 of 12 of FOLFOX. His urinary symptoms have resolved, but he still complains of mild scrotal swelling. He otherwise feels well and is asymptomatic. He denies any fevers. He has no neurologic complaints. He has no chest pain or shortness of breath. He denies any weight loss. He denies any easy bleeding or bruising. He has no nausea, vomiting, or constipation. Patient offers no further specific complaints today.   REVIEW OF SYSTEMS:   Review of Systems  Constitutional: Negative.   Genitourinary: Negative for urgency, frequency and hematuria.       Testicular swelling    As per HPI. Otherwise, a complete review of systems is negatve.  PAST MEDICAL HISTORY: Past Medical History  Diagnosis Date  . Colon cancer   . DVT (deep venous thrombosis)     PAST SURGICAL HISTORY: Past Surgical History  Procedure Laterality Date  . Colon resection  12/19/2013    FAMILY HISTORY: Grandmother with "liver cancer".     ADVANCED DIRECTIVES:    HEALTH MAINTENANCE: History  Substance Use Topics  . Smoking status: Current Every Day Smoker -- 1.00 packs/day for 54 years    Types: Cigarettes  . Smokeless tobacco: Not on file  . Alcohol Use: Yes     Comment: moderate     Colonoscopy:  PAP:  Bone density:  Lipid panel:  Allergies  Allergen Reactions  . No Known Allergies     Current Outpatient Prescriptions  Medication Sig Dispense Refill  . levofloxacin (LEVAQUIN) 500 MG tablet Take 1 tablet (500 mg total) by mouth daily. 7 tablet 0  . metoprolol tartrate  (LOPRESSOR) 25 MG tablet Take 25 mg by mouth 2 (two) times daily.    Marland Kitchen warfarin (COUMADIN) 5 MG tablet Take 7.5 mg by mouth daily. 1.5 tabs orally once a day, as directed.  Held last Friday, Saturday & Sunday.  Then resumed 1.5 tab     No current facility-administered medications for this visit.   Facility-Administered Medications Ordered in Other Visits  Medication Dose Route Frequency Provider Last Rate Last Dose  . sodium chloride 0.9 % injection 10 mL  10 mL Intracatheter PRN Lloyd Huger, MD   10 mL at 07/08/14 1020  . sodium chloride 0.9 % injection 10 mL  10 mL Intracatheter PRN Lloyd Huger, MD   10 mL at 07/10/14 1443    OBJECTIVE: Filed Vitals:   07/08/14 1404  BP: 155/92  Pulse: 52  Temp: 96.2 F (35.7 C)  Resp: 18     Body mass index is 28.68 kg/(m^2).    ECOG FS:0 - Asymptomatic  General: Well-developed, well-nourished, no acute distress. Eyes: anicteric sclera. Lungs: Clear to auscultation bilaterally. Heart: Regular rate and rhythm. No rubs, murmurs, or gallops. Abdomen: Soft, nontender, nondistended. No organomegaly noted, normoactive bowel sounds. Musculoskeletal: No edema, cyanosis, or clubbing. Neuro: Alert, answering all questions appropriately. Cranial nerves grossly intact. Skin: No rashes or petechiae noted. Psych: Normal affect.    LAB RESULTS:  Lab Results  Component Value Date   NA 137 07/08/2014   K 3.6 07/08/2014   CL 108 07/08/2014  CO2 25 07/08/2014   GLUCOSE 107* 07/08/2014   BUN 12 07/08/2014   CREATININE 1.36* 07/08/2014   CALCIUM 8.5* 07/08/2014   PROT 6.7 07/08/2014   ALBUMIN 3.4* 07/08/2014   AST 26 07/08/2014   ALT 19 07/08/2014   ALKPHOS 103 07/08/2014   BILITOT 0.7 07/08/2014   GFRNONAA 54* 07/08/2014   GFRAA >60 07/08/2014    Lab Results  Component Value Date   WBC 5.7 07/08/2014   NEUTROABS 3.7 07/08/2014   HGB 11.5* 07/08/2014   HCT 35.9* 07/08/2014   MCV 89.6 07/08/2014   PLT 102* 07/08/2014      STUDIES: US Scrotum  06/29/2014   CLINICAL DATA:  Testicular pain.  EXAM: SCROTAL ULTRASOUND  DOPPLER ULTRASOUND OF THE TESTICLES  TECHNIQUE: Complete ultrasound examination of the testicles, epididymis, and other scrotal structures was performed. Color and spectral Doppler ultrasound were also utilized to evaluate blood flow to the testicles.  COMPARISON:  None.  FINDINGS: Right testicle  Measurements: 4.8 x 1.8 x 3 cm. No testicular mass. Testicular microlithiasis.  Left testicle  Measurements: 4.4 x 1.5 x 2.7 cm. No testicular mass. Testicular microlithiasis.  Right epididymis: Normal in size and appearance. 6 x 5 x 6 mm anechoic right epididymal mass most consistent with a cyst.  Left epididymis: Normal in size and echogenicity. 4.1 x 2.5 x 3.5 cm focal anechoic area adjacent to the epididymis which may reflect a large epididymal cyst or spermatocele versus loculated hydrocele.  Hydrocele:  No right hydrocele.  Varicocele:  None visualized.  Pulsed Doppler interrogation of both testes demonstrates normal low resistance arterial and venous waveforms bilaterally.  IMPRESSION: 1. No testicular torsion. 2. Large left epididymal cyst or spermatocele versus loculated hydrocele. 3. Bilateral testicular microlithiasis. Current literature suggests that testicular microlithiasis is not a significant independent risk factor for development of testicular carcinoma, and that follow up imaging is not warranted in the absence of other risk factors. Monthly testicular self-examination and annual physical exams are considered appropriate surveillance. If patient has other risk factors for testicular carcinoma, then referral to Urology should be considered. (Reference: DeCastro, et al.: A 5-Year Follow up Study of Asymptomatic Men with Testicular Microlithiasis. J Urol 2008; 270:3500-9381.)   Electronically Signed   By: Kathreen Devoid   On: 06/29/2014 15:16   [place] Art/ven Flow Abd Pelv Doppler  06/29/2014   CLINICAL DATA:   Testicular pain.  EXAM: SCROTAL ULTRASOUND  DOPPLER ULTRASOUND OF THE TESTICLES  TECHNIQUE: Complete ultrasound examination of the testicles, epididymis, and other scrotal structures was performed. Color and spectral Doppler ultrasound were also utilized to evaluate blood flow to the testicles.  COMPARISON:  None.  FINDINGS: Right testicle  Measurements: 4.8 x 1.8 x 3 cm. No testicular mass. Testicular microlithiasis.  Left testicle  Measurements: 4.4 x 1.5 x 2.7 cm. No testicular mass. Testicular microlithiasis.  Right epididymis: Normal in size and appearance. 6 x 5 x 6 mm anechoic right epididymal mass most consistent with a cyst.  Left epididymis: Normal in size and echogenicity. 4.1 x 2.5 x 3.5 cm focal anechoic area adjacent to the epididymis which may reflect a large epididymal cyst or spermatocele versus loculated hydrocele.  Hydrocele:  No right hydrocele.  Varicocele:  None visualized.  Pulsed Doppler interrogation of both testes demonstrates normal low resistance arterial and venous waveforms bilaterally.  IMPRESSION: 1. No testicular torsion. 2. Large left epididymal cyst or spermatocele versus loculated hydrocele. 3. Bilateral testicular microlithiasis. Current literature suggests that testicular microlithiasis is not a  significant independent risk factor for development of testicular carcinoma, and that follow up imaging is not warranted in the absence of other risk factors. Monthly testicular self-examination and annual physical exams are considered appropriate surveillance. If patient has other risk factors for testicular carcinoma, then referral to Urology should be considered. (Reference: DeCastro, et al.: A 5-Year Follow up Study of Asymptomatic Men with Testicular Microlithiasis. J Urol 2008; 621:3086-5784.)   Electronically Signed   By: Kathreen Devoid   On: 06/29/2014 15:16    ASSESSMENT: Stage IIIB colon cancer, DVT.  PLAN:    1. Colon cancer: Preop CEA was elevated at 69.5, it is now  within normal limits. Proceed with cycle 11 of 12 of adjuvant FOLFOX today. Patient will receive Neulasta with the remainder of his treatments. Return to clinic in 2 days for pump removal and Neulasta and in 2 weeks for consideration of cycle 12. 2. DVT: INR significantly elevated today. Patient could not afford Xarelto and was initiated on Coumadin. Countinue at 7.5 mg daily M-F. Patient will require 6 months of treatment.  3. Elevated creatinine: Patient creatinine is now within normal limits. 4. Hypokalemia: Potassium now within normal limits, monitor. 5. Diarrhea: Improving. Continue Lomotil as needed. 6. Testicular pain: Possible epididymitis. Sound as above. Continue anti-biotics as prescribed.   Patient expressed understanding and was in agreement with this plan. He also understands that He can call clinic at any time with any questions, concerns, or complaints.   Colon cancer   Staging form: Neuroendocrine Tumor - Colon/Rectum, AJCC 7th Edition     Clinical stage from 05/04/2014: Stage IIIB (T4, N1, M0) - Signed by Lloyd Huger, MD on 05/04/2014   Lloyd Huger, MD   07/10/2014 4:47 PM

## 2014-07-20 ENCOUNTER — Other Ambulatory Visit: Payer: Self-pay | Admitting: *Deleted

## 2014-07-20 MED ORDER — WARFARIN SODIUM 5 MG PO TABS: 7.5000 mg | ORAL_TABLET | Freq: Every day | ORAL | Status: DC

## 2014-07-20 MED ORDER — WARFARIN SODIUM 5 MG PO TABS
7.5000 mg | ORAL_TABLET | Freq: Every day | ORAL | Status: DC
Start: 2014-07-20 — End: 2014-07-20

## 2014-07-22 ENCOUNTER — Telehealth: Payer: Self-pay

## 2014-07-22 ENCOUNTER — Inpatient Hospital Stay (HOSPITAL_BASED_OUTPATIENT_CLINIC_OR_DEPARTMENT_OTHER): Payer: Medicaid Other | Admitting: Oncology

## 2014-07-22 ENCOUNTER — Inpatient Hospital Stay: Payer: Medicaid Other

## 2014-07-22 VITALS — BP 115/84 | HR 60 | Temp 98.0°F | Resp 20

## 2014-07-22 VITALS — BP 112/96 | HR 59 | Temp 97.3°F | Resp 20 | Wt 218.9 lb

## 2014-07-22 DIAGNOSIS — N509 Disorder of male genital organs, unspecified: Secondary | ICD-10-CM

## 2014-07-22 DIAGNOSIS — Z418 Encounter for other procedures for purposes other than remedying health state: Secondary | ICD-10-CM

## 2014-07-22 DIAGNOSIS — C189 Malignant neoplasm of colon, unspecified: Secondary | ICD-10-CM

## 2014-07-22 DIAGNOSIS — I82409 Acute embolism and thrombosis of unspecified deep veins of unspecified lower extremity: Secondary | ICD-10-CM

## 2014-07-22 DIAGNOSIS — Z9049 Acquired absence of other specified parts of digestive tract: Secondary | ICD-10-CM

## 2014-07-22 DIAGNOSIS — Z7901 Long term (current) use of anticoagulants: Secondary | ICD-10-CM

## 2014-07-22 DIAGNOSIS — Z79899 Other long term (current) drug therapy: Secondary | ICD-10-CM

## 2014-07-22 DIAGNOSIS — Z800 Family history of malignant neoplasm of digestive organs: Secondary | ICD-10-CM

## 2014-07-22 DIAGNOSIS — E876 Hypokalemia: Secondary | ICD-10-CM

## 2014-07-22 DIAGNOSIS — F17210 Nicotine dependence, cigarettes, uncomplicated: Secondary | ICD-10-CM

## 2014-07-22 DIAGNOSIS — Z86718 Personal history of other venous thrombosis and embolism: Secondary | ICD-10-CM

## 2014-07-22 DIAGNOSIS — R197 Diarrhea, unspecified: Secondary | ICD-10-CM | POA: Diagnosis not present

## 2014-07-22 DIAGNOSIS — Z5111 Encounter for antineoplastic chemotherapy: Secondary | ICD-10-CM | POA: Diagnosis not present

## 2014-07-22 LAB — CBC WITH DIFFERENTIAL/PLATELET
Basophils Absolute: 0.1 10*3/uL (ref 0–0.1)
Basophils Relative: 1 %
Eosinophils Absolute: 0.2 10*3/uL (ref 0–0.7)
Eosinophils Relative: 2 %
HCT: 35.9 % — ABNORMAL LOW (ref 40.0–52.0)
Hemoglobin: 11.7 g/dL — ABNORMAL LOW (ref 13.0–18.0)
Lymphocytes Relative: 13 %
Lymphs Abs: 1 10*3/uL (ref 1.0–3.6)
MCH: 29.4 pg (ref 26.0–34.0)
MCHC: 32.6 g/dL (ref 32.0–36.0)
MCV: 90.1 fL (ref 80.0–100.0)
Monocytes Absolute: 0.7 10*3/uL (ref 0.2–1.0)
Monocytes Relative: 9 %
Neutro Abs: 5.9 10*3/uL (ref 1.4–6.5)
Neutrophils Relative %: 75 %
Platelets: 102 10*3/uL — ABNORMAL LOW (ref 150–440)
RBC: 3.98 MIL/uL — ABNORMAL LOW (ref 4.40–5.90)
RDW: 20 % — ABNORMAL HIGH (ref 11.5–14.5)
WBC: 7.9 10*3/uL (ref 3.8–10.6)

## 2014-07-22 LAB — COMPREHENSIVE METABOLIC PANEL WITH GFR
ALT: 20 U/L (ref 17–63)
AST: 28 U/L (ref 15–41)
Albumin: 3.5 g/dL (ref 3.5–5.0)
Alkaline Phosphatase: 117 U/L (ref 38–126)
Anion gap: 5 (ref 5–15)
BUN: 10 mg/dL (ref 6–20)
CO2: 26 mmol/L (ref 22–32)
Calcium: 8.4 mg/dL — ABNORMAL LOW (ref 8.9–10.3)
Chloride: 106 mmol/L (ref 101–111)
Creatinine, Ser: 1.36 mg/dL — ABNORMAL HIGH (ref 0.61–1.24)
GFR calc Af Amer: >60 mL/min
GFR calc non Af Amer: 54 mL/min — ABNORMAL LOW
Glucose, Bld: 130 mg/dL — ABNORMAL HIGH (ref 65–99)
Potassium: 3.1 mmol/L — ABNORMAL LOW (ref 3.5–5.1)
Sodium: 137 mmol/L (ref 135–145)
Total Bilirubin: 0.7 mg/dL (ref 0.3–1.2)
Total Protein: 6.9 g/dL (ref 6.5–8.1)

## 2014-07-22 LAB — PROTIME-INR
INR: 1.69
Prothrombin Time: 20.1 s — ABNORMAL HIGH (ref 11.4–15.0)

## 2014-07-22 MED ORDER — DEXTROSE 5 % IV SOLN
Freq: Once | INTRAVENOUS | Status: AC
  Administered 2014-07-22: 10:00:00 via INTRAVENOUS
  Filled 2014-07-22: qty 1000

## 2014-07-22 MED ORDER — DEXTROSE-NACL 5-0.45 % IV SOLN
INTRAVENOUS | Status: DC
  Filled 2014-07-22: qty 1000

## 2014-07-22 MED ORDER — POTASSIUM CHLORIDE 2 MEQ/ML IV SOLN
INTRAVENOUS | Status: DC
  Administered 2014-07-22: 10:00:00 via INTRAVENOUS
  Filled 2014-07-22: qty 1000

## 2014-07-22 MED ORDER — LEUCOVORIN CALCIUM INJECTION 350 MG
400.0000 mg/m2 | Freq: Once | INTRAMUSCULAR | Status: AC
  Administered 2014-07-22: 920 mg via INTRAVENOUS
  Filled 2014-07-22: qty 46

## 2014-07-22 MED ORDER — PALONOSETRON HCL INJECTION 0.25 MG/5ML
0.2500 mg | Freq: Once | INTRAVENOUS | Status: AC
  Administered 2014-07-22: 0.25 mg via INTRAVENOUS
  Filled 2014-07-22: qty 5

## 2014-07-22 MED ORDER — OXALIPLATIN CHEMO INJECTION 100 MG/20ML
85.0000 mg/m2 | Freq: Once | INTRAVENOUS | Status: AC
  Administered 2014-07-22: 195 mg via INTRAVENOUS
  Filled 2014-07-22: qty 39

## 2014-07-22 MED ORDER — FOSAPREPITANT DIMEGLUMINE INJECTION 150 MG
Freq: Once | INTRAVENOUS | Status: AC
  Administered 2014-07-22: 13:00:00 via INTRAVENOUS
  Filled 2014-07-22: qty 5

## 2014-07-22 MED ORDER — SODIUM CHLORIDE 0.9 % IV SOLN
2400.0000 mg/m2 | INTRAVENOUS | Status: DC
  Administered 2014-07-22: 5500 mg via INTRAVENOUS
  Filled 2014-07-22: qty 110

## 2014-07-22 MED ORDER — FLUOROURACIL CHEMO INJECTION 2.5 GM/50ML
400.0000 mg/m2 | Freq: Once | INTRAVENOUS | Status: AC
  Administered 2014-07-22: 900 mg via INTRAVENOUS
  Filled 2014-07-22: qty 18

## 2014-07-22 MED ORDER — HEPARIN SOD (PORK) LOCK FLUSH 100 UNIT/ML IV SOLN
500.0000 [IU] | Freq: Once | INTRAVENOUS | Status: DC
  Filled 2014-07-22: qty 5

## 2014-07-22 MED ORDER — SODIUM CHLORIDE 0.9 % IJ SOLN
10.0000 mL | INTRAMUSCULAR | Status: DC | PRN
  Administered 2014-07-22: 10 mL via INTRAVENOUS
  Filled 2014-07-22: qty 10

## 2014-07-22 NOTE — Telephone Encounter (Signed)
D5/1/2NS/81meqKcl 544ml/hr changed to over 2 hours in liter bag

## 2014-07-24 ENCOUNTER — Inpatient Hospital Stay: Payer: Medicaid Other | Attending: Oncology

## 2014-07-24 VITALS — BP 132/84 | HR 74 | Resp 20

## 2014-07-24 DIAGNOSIS — C189 Malignant neoplasm of colon, unspecified: Secondary | ICD-10-CM

## 2014-07-24 MED ORDER — PEGFILGRASTIM INJECTION 6 MG/0.6ML ~~LOC~~
6.0000 mg | PREFILLED_SYRINGE | Freq: Once | SUBCUTANEOUS | Status: DC
  Filled 2014-07-24: qty 0.6

## 2014-07-24 MED ORDER — HEPARIN SOD (PORK) LOCK FLUSH 100 UNIT/ML IV SOLN
INTRAVENOUS | Status: AC
  Filled 2014-07-24: qty 5

## 2014-07-24 MED ORDER — SODIUM CHLORIDE 0.9 % IJ SOLN
10.0000 mL | INTRAMUSCULAR | Status: DC | PRN
  Administered 2014-07-24: 10 mL
  Filled 2014-07-24: qty 10

## 2014-07-24 MED ORDER — HEPARIN SOD (PORK) LOCK FLUSH 100 UNIT/ML IV SOLN
500.0000 [IU] | Freq: Once | INTRAVENOUS | Status: AC | PRN
  Administered 2014-07-24: 500 [IU]

## 2014-07-29 ENCOUNTER — Inpatient Hospital Stay: Payer: Medicaid Other

## 2014-07-29 DIAGNOSIS — C189 Malignant neoplasm of colon, unspecified: Secondary | ICD-10-CM | POA: Diagnosis not present

## 2014-07-29 DIAGNOSIS — I82409 Acute embolism and thrombosis of unspecified deep veins of unspecified lower extremity: Secondary | ICD-10-CM

## 2014-07-29 LAB — COMPREHENSIVE METABOLIC PANEL
ALK PHOS: 134 U/L — AB (ref 38–126)
ALT: 34 U/L (ref 17–63)
ANION GAP: 6 (ref 5–15)
AST: 36 U/L (ref 15–41)
Albumin: 3.9 g/dL (ref 3.5–5.0)
BILIRUBIN TOTAL: 0.9 mg/dL (ref 0.3–1.2)
BUN: 11 mg/dL (ref 6–20)
CALCIUM: 8.8 mg/dL — AB (ref 8.9–10.3)
CO2: 26 mmol/L (ref 22–32)
CREATININE: 1.2 mg/dL (ref 0.61–1.24)
Chloride: 104 mmol/L (ref 101–111)
GFR calc non Af Amer: >60 mL/min (ref >60)
GLUCOSE: 101 mg/dL — AB (ref 65–99)
Potassium: 4 mmol/L (ref 3.5–5.1)
Sodium: 136 mmol/L (ref 135–145)
Total Protein: 7.4 g/dL (ref 6.5–8.1)

## 2014-07-29 LAB — CBC WITH DIFFERENTIAL/PLATELET
BASOS PCT: 0 %
Basophils Absolute: 0.1 10*3/uL (ref 0–0.1)
EOS ABS: 0.1 10*3/uL (ref 0–0.7)
Eosinophils Relative: 1 %
HEMATOCRIT: 37.9 % — AB (ref 40.0–52.0)
Hemoglobin: 12.3 g/dL — ABNORMAL LOW (ref 13.0–18.0)
Lymphocytes Relative: 8 %
Lymphs Abs: 0.9 10*3/uL — ABNORMAL LOW (ref 1.0–3.6)
MCH: 29.2 pg (ref 26.0–34.0)
MCHC: 32.5 g/dL (ref 32.0–36.0)
MCV: 89.8 fL (ref 80.0–100.0)
MONO ABS: 0.7 10*3/uL (ref 0.2–1.0)
Monocytes Relative: 6 %
NEUTROS ABS: 10.1 10*3/uL — AB (ref 1.4–6.5)
Neutrophils Relative %: 85 %
Platelets: 96 10*3/uL — ABNORMAL LOW (ref 150–440)
RBC: 4.21 MIL/uL — ABNORMAL LOW (ref 4.40–5.90)
RDW: 19.2 % — AB (ref 11.5–14.5)
WBC: 11.9 10*3/uL — ABNORMAL HIGH (ref 3.8–10.6)

## 2014-07-29 LAB — PROTIME-INR
INR: 2.13
Prothrombin Time: 24 seconds — ABNORMAL HIGH (ref 11.4–15.0)

## 2014-07-29 NOTE — Progress Notes (Signed)
[place]  Telephone:(336) [phone] Fax:(336) [phone]  ID: James Keller OB: 1950-06-23  MR#: [id number]  DGL#:[id number]  Patient Care Team: Steele Sizer, MD as PCP - General (Family Medicine)  CHIEF COMPLAINT:  Chief Complaint  Patient presents with  . Follow-up    colon cancer    INTERVAL HISTORY: Patient returns to clinic today for further evaluation and consideration of cycle 12 of 12 of FOLFOX. He still complains of mild scrotal swelling. He otherwise feels well and is asymptomatic. He denies any fevers. He has no neurologic complaints. He has no chest pain or shortness of breath. He denies any weight loss. He denies any easy bleeding or bruising. He has no nausea, vomiting, or constipation. Patient offers no further specific complaints today.   REVIEW OF SYSTEMS:   Review of Systems  Constitutional: Negative.   Genitourinary: Negative for urgency, frequency and hematuria.       Testicular swelling    As per HPI. Otherwise, a complete review of systems is negatve.  PAST MEDICAL HISTORY: Past Medical History  Diagnosis Date  . Colon cancer   . DVT (deep venous thrombosis)     PAST SURGICAL HISTORY: Past Surgical History  Procedure Laterality Date  . Colon resection  12/19/2013    FAMILY HISTORY: Grandmother with "liver cancer".     ADVANCED DIRECTIVES:    HEALTH MAINTENANCE: History  Substance Use Topics  . Smoking status: Current Every Day Smoker -- 1.00 packs/day for 54 years    Types: Cigarettes  . Smokeless tobacco: Not on file  . Alcohol Use: Yes     Comment: moderate     Colonoscopy:  PAP:  Bone density:  Lipid panel:  Allergies  Allergen Reactions  . No Known Allergies     Current Outpatient Prescriptions  Medication Sig Dispense Refill  . metoprolol tartrate (LOPRESSOR) 25 MG tablet Take 25 mg by mouth 2 (two) times daily.    Marland Kitchen warfarin (COUMADIN) 5 MG tablet Take 1.5 tablets (7.5 mg total) by mouth  daily. 1.5 tabs orally once a day, as directed. 45 tablet 2  . levofloxacin (LEVAQUIN) 500 MG tablet Take 1 tablet (500 mg total) by mouth daily. (Patient not taking: Reported on 07/22/2014) 7 tablet 0   No current facility-administered medications for this visit.   Facility-Administered Medications Ordered in Other Visits  Medication Dose Route Frequency Provider Last Rate Last Dose  . pegfilgrastim (NEULASTA) injection 6 mg  6 mg Subcutaneous Once Lloyd Huger, MD   6 mg at 07/24/14 1511  . sodium chloride 0.9 % injection 10 mL  10 mL Intracatheter PRN Lloyd Huger, MD   10 mL at 07/08/14 1020  . sodium chloride 0.9 % injection 10 mL  10 mL Intracatheter PRN Lloyd Huger, MD   10 mL at 07/24/14 1507    OBJECTIVE: Filed Vitals:   07/22/14 0915  BP: 112/96  Pulse: 59  Temp: 97.3 F (36.3 C)  Resp: 20     Body mass index is 29.33 kg/(m^2).    ECOG FS:0 - Asymptomatic  General: Well-developed, well-nourished, no acute distress. Eyes: anicteric sclera. Lungs: Clear to auscultation bilaterally. Heart: Regular rate and rhythm. No rubs, murmurs, or gallops. Abdomen: Soft, nontender, nondistended. No organomegaly noted, normoactive bowel sounds. Musculoskeletal: No edema, cyanosis, or clubbing. Neuro: Alert, answering all questions appropriately. Cranial nerves grossly intact. Skin: No rashes or petechiae noted. Psych: Normal affect.    LAB RESULTS:  Lab Results  Component Value Date  NA 136 07/29/2014   K 4.0 07/29/2014   CL 104 07/29/2014   CO2 26 07/29/2014   GLUCOSE 101* 07/29/2014   BUN 11 07/29/2014   CREATININE 1.20 07/29/2014   CALCIUM 8.8* 07/29/2014   PROT 7.4 07/29/2014   ALBUMIN 3.9 07/29/2014   AST 36 07/29/2014   ALT 34 07/29/2014   ALKPHOS 134* 07/29/2014   BILITOT 0.9 07/29/2014   GFRNONAA >60 07/29/2014   GFRAA >60 07/29/2014    Lab Results  Component Value Date   WBC 11.9* 07/29/2014   NEUTROABS 10.1* 07/29/2014   HGB 12.3*  07/29/2014   HCT 37.9* 07/29/2014   MCV 89.8 07/29/2014   PLT 96* 07/29/2014     STUDIES: US Scrotum  06/29/2014   CLINICAL DATA:  Testicular pain.  EXAM: SCROTAL ULTRASOUND  DOPPLER ULTRASOUND OF THE TESTICLES  TECHNIQUE: Complete ultrasound examination of the testicles, epididymis, and other scrotal structures was performed. Color and spectral Doppler ultrasound were also utilized to evaluate blood flow to the testicles.  COMPARISON:  None.  FINDINGS: Right testicle  Measurements: 4.8 x 1.8 x 3 cm. No testicular mass. Testicular microlithiasis.  Left testicle  Measurements: 4.4 x 1.5 x 2.7 cm. No testicular mass. Testicular microlithiasis.  Right epididymis: Normal in size and appearance. 6 x 5 x 6 mm anechoic right epididymal mass most consistent with a cyst.  Left epididymis: Normal in size and echogenicity. 4.1 x 2.5 x 3.5 cm focal anechoic area adjacent to the epididymis which may reflect a large epididymal cyst or spermatocele versus loculated hydrocele.  Hydrocele:  No right hydrocele.  Varicocele:  None visualized.  Pulsed Doppler interrogation of both testes demonstrates normal low resistance arterial and venous waveforms bilaterally.  IMPRESSION: 1. No testicular torsion. 2. Large left epididymal cyst or spermatocele versus loculated hydrocele. 3. Bilateral testicular microlithiasis. Current literature suggests that testicular microlithiasis is not a significant independent risk factor for development of testicular carcinoma, and that follow up imaging is not warranted in the absence of other risk factors. Monthly testicular self-examination and annual physical exams are considered appropriate surveillance. If patient has other risk factors for testicular carcinoma, then referral to Urology should be considered. (Reference: DeCastro, et al.: A 5-Year Follow up Study of Asymptomatic Men with Testicular Microlithiasis. J Urol 2008; 160:7371-0626.)   Electronically Signed   By: Kathreen Devoid   On:  06/29/2014 15:16   [place] Art/ven Flow Abd Pelv Doppler  06/29/2014   CLINICAL DATA:  Testicular pain.  EXAM: SCROTAL ULTRASOUND  DOPPLER ULTRASOUND OF THE TESTICLES  TECHNIQUE: Complete ultrasound examination of the testicles, epididymis, and other scrotal structures was performed. Color and spectral Doppler ultrasound were also utilized to evaluate blood flow to the testicles.  COMPARISON:  None.  FINDINGS: Right testicle  Measurements: 4.8 x 1.8 x 3 cm. No testicular mass. Testicular microlithiasis.  Left testicle  Measurements: 4.4 x 1.5 x 2.7 cm. No testicular mass. Testicular microlithiasis.  Right epididymis: Normal in size and appearance. 6 x 5 x 6 mm anechoic right epididymal mass most consistent with a cyst.  Left epididymis: Normal in size and echogenicity. 4.1 x 2.5 x 3.5 cm focal anechoic area adjacent to the epididymis which may reflect a large epididymal cyst or spermatocele versus loculated hydrocele.  Hydrocele:  No right hydrocele.  Varicocele:  None visualized.  Pulsed Doppler interrogation of both testes demonstrates normal low resistance arterial and venous waveforms bilaterally.  IMPRESSION: 1. No testicular torsion. 2. Large left epididymal cyst or spermatocele versus  loculated hydrocele. 3. Bilateral testicular microlithiasis. Current literature suggests that testicular microlithiasis is not a significant independent risk factor for development of testicular carcinoma, and that follow up imaging is not warranted in the absence of other risk factors. Monthly testicular self-examination and annual physical exams are considered appropriate surveillance. If patient has other risk factors for testicular carcinoma, then referral to Urology should be considered. (Reference: DeCastro, et al.: A 5-Year Follow up Study of Asymptomatic Men with Testicular Microlithiasis. J Urol 2008; 350:0938-1829.)   Electronically Signed   By: Kathreen Devoid   On: 06/29/2014 15:16    ASSESSMENT: Stage IIIB colon  cancer, DVT.  PLAN:    1. Colon cancer: Preop CEA was elevated at 69.5, it is now within normal limits. Proceed with cycle 12 of 12 of adjuvant FOLFOX today. Patient will receive Neulasta with the remainder of his treatments. Return to clinic in 2 days for pump removal and Neulasta. Patient has now completed his adjuvant chemotherapy, therefore will return to clinic in 4-6 weeks for his survivorship visit and then in 3 months for routine evaluation. He will require a repeat colonoscopy within 6-12 months of completing his treatments.  2. DVT: INR is slightly decreased today. Patient could not afford Xarelto and was initiated on Coumadin. Countinue at 7.5 mg daily M-F. Patient will require 6 months of treatment.  3. Elevated creatinine: Patient creatinine is now within normal limits. 4. Hypokalemia: Potassium now within normal limits, monitor. 5. Diarrhea: Improving. Continue Lomotil as needed. 6. Testicular pain: Improved. Ultrasound as above.   Patient expressed understanding and was in agreement with this plan. He also understands that He can call clinic at any time with any questions, concerns, or complaints.   Colon cancer   Staging form: Neuroendocrine Tumor - Colon/Rectum, AJCC 7th Edition     Clinical stage from 05/04/2014: Stage IIIB (T4, N1, M0) - Signed by Lloyd Huger, MD on 05/04/2014   Lloyd Huger, MD   07/29/2014 1:15 PM

## 2014-07-30 LAB — CEA: CEA: 2.7 ng/mL (ref 0.0–4.7)

## 2014-08-05 ENCOUNTER — Inpatient Hospital Stay: Payer: Medicaid Other

## 2014-08-05 DIAGNOSIS — I82409 Acute embolism and thrombosis of unspecified deep veins of unspecified lower extremity: Secondary | ICD-10-CM

## 2014-08-05 DIAGNOSIS — C189 Malignant neoplasm of colon, unspecified: Secondary | ICD-10-CM

## 2014-08-05 LAB — CBC WITH DIFFERENTIAL/PLATELET
BASOS ABS: 0.1 10*3/uL (ref 0–0.1)
Basophils Relative: 1 %
EOS ABS: 0.2 10*3/uL (ref 0–0.7)
Eosinophils Relative: 3 %
HEMATOCRIT: 40.1 % (ref 40.0–52.0)
Hemoglobin: 12.9 g/dL — ABNORMAL LOW (ref 13.0–18.0)
LYMPHS PCT: 15 %
Lymphs Abs: 1.1 10*3/uL (ref 1.0–3.6)
MCH: 29 pg (ref 26.0–34.0)
MCHC: 32.1 g/dL (ref 32.0–36.0)
MCV: 90.5 fL (ref 80.0–100.0)
Monocytes Absolute: 0.6 10*3/uL (ref 0.2–1.0)
Monocytes Relative: 8 %
NEUTROS ABS: 5.2 10*3/uL (ref 1.4–6.5)
Neutrophils Relative %: 73 %
Platelets: 103 10*3/uL — ABNORMAL LOW (ref 150–440)
RBC: 4.43 MIL/uL (ref 4.40–5.90)
RDW: 19.6 % — AB (ref 11.5–14.5)
WBC: 7.1 10*3/uL (ref 3.8–10.6)

## 2014-08-05 LAB — COMPREHENSIVE METABOLIC PANEL
ALK PHOS: 112 U/L (ref 38–126)
ALT: 27 U/L (ref 17–63)
AST: 33 U/L (ref 15–41)
Albumin: 3.7 g/dL (ref 3.5–5.0)
Anion gap: 7 (ref 5–15)
BUN: 17 mg/dL (ref 6–20)
CHLORIDE: 106 mmol/L (ref 101–111)
CO2: 24 mmol/L (ref 22–32)
CREATININE: 1.36 mg/dL — AB (ref 0.61–1.24)
Calcium: 8.7 mg/dL — ABNORMAL LOW (ref 8.9–10.3)
GFR, EST NON AFRICAN AMERICAN: 54 mL/min — AB (ref >60)
Glucose, Bld: 143 mg/dL — ABNORMAL HIGH (ref 65–99)
Potassium: 3.5 mmol/L (ref 3.5–5.1)
SODIUM: 137 mmol/L (ref 135–145)
Total Bilirubin: 0.8 mg/dL (ref 0.3–1.2)
Total Protein: 7.2 g/dL (ref 6.5–8.1)

## 2014-08-05 LAB — PROTIME-INR
INR: 3.25
PROTHROMBIN TIME: 33.2 s — AB (ref 11.4–15.0)

## 2014-08-12 ENCOUNTER — Inpatient Hospital Stay: Payer: Medicaid Other

## 2014-08-12 DIAGNOSIS — C189 Malignant neoplasm of colon, unspecified: Secondary | ICD-10-CM | POA: Diagnosis not present

## 2014-08-12 DIAGNOSIS — I82409 Acute embolism and thrombosis of unspecified deep veins of unspecified lower extremity: Secondary | ICD-10-CM

## 2014-08-12 LAB — CBC WITH DIFFERENTIAL/PLATELET
BASOS ABS: 0.1 10*3/uL (ref 0–0.1)
Basophils Relative: 1 %
EOS ABS: 0.2 10*3/uL (ref 0–0.7)
EOS PCT: 5 %
HCT: 40.1 % (ref 40.0–52.0)
HEMOGLOBIN: 13 g/dL (ref 13.0–18.0)
Lymphocytes Relative: 24 %
Lymphs Abs: 1.2 10*3/uL (ref 1.0–3.6)
MCH: 29.2 pg (ref 26.0–34.0)
MCHC: 32.4 g/dL (ref 32.0–36.0)
MCV: 90.3 fL (ref 80.0–100.0)
MONO ABS: 0.7 10*3/uL (ref 0.2–1.0)
MONOS PCT: 14 %
Neutro Abs: 2.8 10*3/uL (ref 1.4–6.5)
Neutrophils Relative %: 56 %
PLATELETS: 115 10*3/uL — AB (ref 150–440)
RBC: 4.44 MIL/uL (ref 4.40–5.90)
RDW: 20.2 % — ABNORMAL HIGH (ref 11.5–14.5)
WBC: 5 10*3/uL (ref 3.8–10.6)

## 2014-08-12 LAB — COMPREHENSIVE METABOLIC PANEL
ALT: 39 U/L (ref 17–63)
AST: 40 U/L (ref 15–41)
Albumin: 3.8 g/dL (ref 3.5–5.0)
Alkaline Phosphatase: 112 U/L (ref 38–126)
Anion gap: 6 (ref 5–15)
BILIRUBIN TOTAL: 1 mg/dL (ref 0.3–1.2)
BUN: 15 mg/dL (ref 6–20)
CHLORIDE: 107 mmol/L (ref 101–111)
CO2: 25 mmol/L (ref 22–32)
CREATININE: 1.48 mg/dL — AB (ref 0.61–1.24)
Calcium: 8.7 mg/dL — ABNORMAL LOW (ref 8.9–10.3)
GFR calc Af Amer: 57 mL/min — ABNORMAL LOW (ref >60)
GFR calc non Af Amer: 49 mL/min — ABNORMAL LOW (ref >60)
Glucose, Bld: 110 mg/dL — ABNORMAL HIGH (ref 65–99)
Potassium: 4 mmol/L (ref 3.5–5.1)
Sodium: 138 mmol/L (ref 135–145)
Total Protein: 7.2 g/dL (ref 6.5–8.1)

## 2014-08-12 LAB — PROTIME-INR
INR: 1.63
Prothrombin Time: 19.5 seconds — ABNORMAL HIGH (ref 11.4–15.0)

## 2014-08-19 ENCOUNTER — Inpatient Hospital Stay: Payer: Medicaid Other

## 2014-08-19 DIAGNOSIS — C189 Malignant neoplasm of colon, unspecified: Secondary | ICD-10-CM

## 2014-08-19 DIAGNOSIS — I82409 Acute embolism and thrombosis of unspecified deep veins of unspecified lower extremity: Secondary | ICD-10-CM

## 2014-08-19 LAB — CBC WITH DIFFERENTIAL/PLATELET
BASOS PCT: 0 %
Basophils Absolute: 0 10*3/uL (ref 0–0.1)
Eosinophils Absolute: 0.2 10*3/uL (ref 0–0.7)
Eosinophils Relative: 8 %
HCT: 39.1 % — ABNORMAL LOW (ref 40.0–52.0)
HEMOGLOBIN: 12.7 g/dL — AB (ref 13.0–18.0)
Lymphocytes Relative: 34 %
Lymphs Abs: 0.9 10*3/uL — ABNORMAL LOW (ref 1.0–3.6)
MCH: 29.3 pg (ref 26.0–34.0)
MCHC: 32.4 g/dL (ref 32.0–36.0)
MCV: 90.5 fL (ref 80.0–100.0)
Monocytes Absolute: 0.5 10*3/uL (ref 0.2–1.0)
Monocytes Relative: 18 %
NEUTROS PCT: 40 %
Neutro Abs: 1.1 10*3/uL — ABNORMAL LOW (ref 1.4–6.5)
Platelets: 116 10*3/uL — ABNORMAL LOW (ref 150–440)
RBC: 4.32 MIL/uL — AB (ref 4.40–5.90)
RDW: 19.5 % — ABNORMAL HIGH (ref 11.5–14.5)
WBC: 2.7 10*3/uL — ABNORMAL LOW (ref 3.8–10.6)

## 2014-08-19 LAB — COMPREHENSIVE METABOLIC PANEL
ALBUMIN: 3.8 g/dL (ref 3.5–5.0)
ALK PHOS: 85 U/L (ref 38–126)
ALT: 24 U/L (ref 17–63)
AST: 30 U/L (ref 15–41)
Anion gap: 6 (ref 5–15)
BILIRUBIN TOTAL: 1.1 mg/dL (ref 0.3–1.2)
BUN: 15 mg/dL (ref 6–20)
CO2: 24 mmol/L (ref 22–32)
Calcium: 8.6 mg/dL — ABNORMAL LOW (ref 8.9–10.3)
Chloride: 108 mmol/L (ref 101–111)
Creatinine, Ser: 1.49 mg/dL — ABNORMAL HIGH (ref 0.61–1.24)
GFR calc non Af Amer: 49 mL/min — ABNORMAL LOW (ref >60)
GFR, EST AFRICAN AMERICAN: 56 mL/min — AB (ref >60)
Glucose, Bld: 112 mg/dL — ABNORMAL HIGH (ref 65–99)
Potassium: 3.5 mmol/L (ref 3.5–5.1)
SODIUM: 138 mmol/L (ref 135–145)
TOTAL PROTEIN: 7.1 g/dL (ref 6.5–8.1)

## 2014-08-19 LAB — PROTIME-INR
INR: 1.67
Prothrombin Time: 19.9 seconds — ABNORMAL HIGH (ref 11.4–15.0)

## 2014-08-21 ENCOUNTER — Other Ambulatory Visit: Payer: Self-pay | Admitting: *Deleted

## 2014-08-21 ENCOUNTER — Telehealth: Payer: Self-pay | Admitting: *Deleted

## 2014-08-21 MED ORDER — WARFARIN SODIUM 5 MG PO TABS: 7.5000 mg | ORAL_TABLET | Freq: Every day | ORAL | Status: DC

## 2014-08-21 NOTE — Telephone Encounter (Signed)
Spoke with Dr. Grayland Ormond, patient has completed therapy with Coumadin. I called patient to inform him there is no need to continue Coumadin.

## 2014-08-26 ENCOUNTER — Inpatient Hospital Stay: Payer: Medicaid Other | Attending: Oncology

## 2014-08-26 DIAGNOSIS — F17210 Nicotine dependence, cigarettes, uncomplicated: Secondary | ICD-10-CM | POA: Diagnosis not present

## 2014-08-26 DIAGNOSIS — Z9049 Acquired absence of other specified parts of digestive tract: Secondary | ICD-10-CM | POA: Diagnosis not present

## 2014-08-26 DIAGNOSIS — G629 Polyneuropathy, unspecified: Secondary | ICD-10-CM | POA: Diagnosis not present

## 2014-08-26 DIAGNOSIS — Z86718 Personal history of other venous thrombosis and embolism: Secondary | ICD-10-CM | POA: Insufficient documentation

## 2014-08-26 DIAGNOSIS — R944 Abnormal results of kidney function studies: Secondary | ICD-10-CM | POA: Diagnosis not present

## 2014-08-26 DIAGNOSIS — Z800 Family history of malignant neoplasm of digestive organs: Secondary | ICD-10-CM | POA: Insufficient documentation

## 2014-08-26 DIAGNOSIS — C189 Malignant neoplasm of colon, unspecified: Secondary | ICD-10-CM | POA: Diagnosis not present

## 2014-08-26 DIAGNOSIS — I82409 Acute embolism and thrombosis of unspecified deep veins of unspecified lower extremity: Secondary | ICD-10-CM

## 2014-08-26 LAB — CBC WITH DIFFERENTIAL/PLATELET
BASOS ABS: 0 10*3/uL (ref 0–0.1)
Basophils Relative: 0 %
EOS ABS: 0.4 10*3/uL (ref 0–0.7)
EOS PCT: 13 %
HCT: 42.2 % (ref 40.0–52.0)
HEMOGLOBIN: 13.7 g/dL (ref 13.0–18.0)
Lymphocytes Relative: 32 %
Lymphs Abs: 0.9 10*3/uL — ABNORMAL LOW (ref 1.0–3.6)
MCH: 29 pg (ref 26.0–34.0)
MCHC: 32.5 g/dL (ref 32.0–36.0)
MCV: 89.2 fL (ref 80.0–100.0)
MONO ABS: 0.4 10*3/uL (ref 0.2–1.0)
Monocytes Relative: 14 %
NEUTROS ABS: 1.1 10*3/uL — AB (ref 1.4–6.5)
NEUTROS PCT: 41 %
Platelets: 131 10*3/uL — ABNORMAL LOW (ref 150–440)
RBC: 4.73 MIL/uL (ref 4.40–5.90)
RDW: 18.7 % — AB (ref 11.5–14.5)
WBC: 2.8 10*3/uL — ABNORMAL LOW (ref 3.8–10.6)

## 2014-08-26 LAB — COMPREHENSIVE METABOLIC PANEL
ALK PHOS: 102 U/L (ref 38–126)
ALT: 28 U/L (ref 17–63)
ANION GAP: 5 (ref 5–15)
AST: 42 U/L — ABNORMAL HIGH (ref 15–41)
Albumin: 3.8 g/dL (ref 3.5–5.0)
BILIRUBIN TOTAL: 1 mg/dL (ref 0.3–1.2)
BUN: 16 mg/dL (ref 6–20)
CO2: 25 mmol/L (ref 22–32)
CREATININE: 1.47 mg/dL — AB (ref 0.61–1.24)
Calcium: 8.5 mg/dL — ABNORMAL LOW (ref 8.9–10.3)
Chloride: 106 mmol/L (ref 101–111)
GFR calc Af Amer: 57 mL/min — ABNORMAL LOW (ref >60)
GFR calc non Af Amer: 49 mL/min — ABNORMAL LOW (ref >60)
GLUCOSE: 100 mg/dL — AB (ref 65–99)
POTASSIUM: 3.9 mmol/L (ref 3.5–5.1)
Sodium: 136 mmol/L (ref 135–145)
Total Protein: 7.3 g/dL (ref 6.5–8.1)

## 2014-08-26 LAB — PROTIME-INR
INR: 1.15
PROTHROMBIN TIME: 14.9 s (ref 11.4–15.0)

## 2014-09-01 ENCOUNTER — Other Ambulatory Visit: Payer: Self-pay | Admitting: *Deleted

## 2014-09-01 DIAGNOSIS — C189 Malignant neoplasm of colon, unspecified: Secondary | ICD-10-CM

## 2014-09-02 ENCOUNTER — Inpatient Hospital Stay: Payer: Medicaid Other

## 2014-09-02 ENCOUNTER — Inpatient Hospital Stay (HOSPITAL_BASED_OUTPATIENT_CLINIC_OR_DEPARTMENT_OTHER): Payer: Medicaid Other | Admitting: Oncology

## 2014-09-02 DIAGNOSIS — Z86718 Personal history of other venous thrombosis and embolism: Secondary | ICD-10-CM | POA: Diagnosis not present

## 2014-09-02 DIAGNOSIS — R944 Abnormal results of kidney function studies: Secondary | ICD-10-CM | POA: Diagnosis not present

## 2014-09-02 DIAGNOSIS — F17210 Nicotine dependence, cigarettes, uncomplicated: Secondary | ICD-10-CM

## 2014-09-02 DIAGNOSIS — C189 Malignant neoplasm of colon, unspecified: Secondary | ICD-10-CM

## 2014-09-02 DIAGNOSIS — Z800 Family history of malignant neoplasm of digestive organs: Secondary | ICD-10-CM

## 2014-09-02 DIAGNOSIS — Z9049 Acquired absence of other specified parts of digestive tract: Secondary | ICD-10-CM

## 2014-09-02 DIAGNOSIS — G629 Polyneuropathy, unspecified: Secondary | ICD-10-CM | POA: Diagnosis not present

## 2014-09-02 LAB — COMPREHENSIVE METABOLIC PANEL
ALT: 23 U/L (ref 17–63)
AST: 27 U/L (ref 15–41)
Albumin: 4 g/dL (ref 3.5–5.0)
Alkaline Phosphatase: 84 U/L (ref 38–126)
Anion gap: 6 (ref 5–15)
BILIRUBIN TOTAL: 1.1 mg/dL (ref 0.3–1.2)
BUN: 18 mg/dL (ref 6–20)
CALCIUM: 8.6 mg/dL — AB (ref 8.9–10.3)
CHLORIDE: 106 mmol/L (ref 101–111)
CO2: 25 mmol/L (ref 22–32)
Creatinine, Ser: 1.43 mg/dL — ABNORMAL HIGH (ref 0.61–1.24)
GFR, EST AFRICAN AMERICAN: 59 mL/min — AB (ref >60)
GFR, EST NON AFRICAN AMERICAN: 51 mL/min — AB (ref >60)
GLUCOSE: 112 mg/dL — AB (ref 65–99)
Potassium: 3.7 mmol/L (ref 3.5–5.1)
SODIUM: 137 mmol/L (ref 135–145)
Total Protein: 7.5 g/dL (ref 6.5–8.1)

## 2014-09-02 LAB — CBC WITH DIFFERENTIAL/PLATELET
BASOS PCT: 0 %
Basophils Absolute: 0 10*3/uL (ref 0–0.1)
EOS ABS: 0.5 10*3/uL (ref 0–0.7)
Eosinophils Relative: 15 %
HCT: 42.6 % (ref 40.0–52.0)
Hemoglobin: 13.9 g/dL (ref 13.0–18.0)
Lymphocytes Relative: 38 %
Lymphs Abs: 1.4 10*3/uL (ref 1.0–3.6)
MCH: 29 pg (ref 26.0–34.0)
MCHC: 32.6 g/dL (ref 32.0–36.0)
MCV: 89.2 fL (ref 80.0–100.0)
Monocytes Absolute: 0.3 10*3/uL (ref 0.2–1.0)
Monocytes Relative: 9 %
NEUTROS PCT: 38 %
Neutro Abs: 1.4 10*3/uL (ref 1.4–6.5)
PLATELETS: 126 10*3/uL — AB (ref 150–440)
RBC: 4.78 MIL/uL (ref 4.40–5.90)
RDW: 17.8 % — AB (ref 11.5–14.5)
WBC: 3.7 10*3/uL — ABNORMAL LOW (ref 3.8–10.6)

## 2014-09-02 MED ORDER — SODIUM CHLORIDE 0.9 % IJ SOLN
10.0000 mL | INTRAMUSCULAR | Status: DC | PRN
  Administered 2014-09-02: 10 mL via INTRAVENOUS
  Filled 2014-09-02: qty 10

## 2014-09-02 MED ORDER — HEPARIN SOD (PORK) LOCK FLUSH 100 UNIT/ML IV SOLN
INTRAVENOUS | Status: AC
  Filled 2014-09-02: qty 5

## 2014-09-02 MED ORDER — HEPARIN SOD (PORK) LOCK FLUSH 100 UNIT/ML IV SOLN
500.0000 [IU] | Freq: Once | INTRAVENOUS | Status: AC
  Administered 2014-09-02: 500 [IU] via INTRAVENOUS

## 2014-09-02 NOTE — Progress Notes (Signed)
Patient here today for survivorship visit. Patient reports numbness/tingling to hands and feet.

## 2014-09-02 NOTE — Progress Notes (Signed)
Survivorship visit completed.  Treatment summary reviewed with patient and given.  ASCO answers survivorship booklet reviewed and given to patient.  Encourage patient to utilize resources provided by cancer center such as CARE program, cancer transitions and calendar with all classes reviewed with patient.

## 2014-09-03 LAB — CEA: CEA: 2.8 ng/mL (ref 0.0–4.7)

## 2014-09-03 NOTE — Progress Notes (Signed)
James Keller  [phone] [phone]  ID: Imre Vecchione OB: Feb 09, 1950  MR#: [id number]  JSE#:[id number]  Patient Care Team: Marlyce Huge, MD as Attending Physician (Surgery)  CHIEF COMPLAINT:  Chief Complaint  Patient presents with  . Follow-up    colon cancer    INTERVAL HISTORY: Patient returns to clinic today for further evaluation and his survivorship visit. His only complaint today is of persistent peripheral neuropathy. He otherwise feels well. He does not complain of scrotal swelling. He denies any fevers. He has no other neurologic complaints. He has no chest pain or shortness of breath. He denies any weight loss. He denies any easy bleeding or bruising. He has no nausea, vomiting, or constipation. Patient offers no further specific complaints today.   REVIEW OF SYSTEMS:   Review of Systems  Constitutional: Negative.   Genitourinary: Negative for urgency, frequency and hematuria.       Testicular swelling  Neurological: Positive for sensory change.    As per HPI. Otherwise, a complete review of systems is negatve.  PAST MEDICAL HISTORY: Past Medical History  Diagnosis Date  . Colon cancer   . DVT (deep venous thrombosis)     PAST SURGICAL HISTORY: Past Surgical History  Procedure Laterality Date  . Colon resection  12/19/2013    FAMILY HISTORY: Grandmother with "liver cancer".     ADVANCED DIRECTIVES:    HEALTH MAINTENANCE: Social History  Substance Use Topics  . Smoking status: Current Every Day Smoker -- 1.00 packs/day for 54 years    Types: Cigarettes  . Smokeless tobacco: Not on file  . Alcohol Use: Yes     Comment: moderate     Colonoscopy:  PAP:  Bone density:  Lipid panel:  Allergies  Allergen Reactions  . No Known Allergies     Current Outpatient Prescriptions  Medication Sig Dispense Refill  . metoprolol tartrate (LOPRESSOR) 25 MG tablet Take 25 mg by mouth 2 (two) times  daily.     Current Facility-Administered Medications  Medication Dose Route Frequency Provider Last Rate Last Dose  . sodium chloride 0.9 % injection 10 mL  10 mL Intravenous PRN Lloyd Huger, MD   10 mL at 09/02/14 1557   Facility-Administered Medications Ordered in Other Visits  Medication Dose Route Frequency Provider Last Rate Last Dose  . pegfilgrastim (NEULASTA) injection 6 mg  6 mg Subcutaneous Once Lloyd Huger, MD   6 mg at 07/24/14 1511  . sodium chloride 0.9 % injection 10 mL  10 mL Intracatheter PRN Lloyd Huger, MD   10 mL at 07/08/14 1020  . sodium chloride 0.9 % injection 10 mL  10 mL Intracatheter PRN Lloyd Huger, MD   10 mL at 07/24/14 1507    OBJECTIVE: Filed Vitals:   09/02/14 1538  BP: 178/95  Pulse: 67  Temp: 95.9 F (35.5 C)  Resp: 20     Body mass index is 30.16 kg/(m^2).    ECOG FS:0 - Asymptomatic  General: Well-developed, well-nourished, no acute distress. Eyes: anicteric sclera. Lungs: Clear to auscultation bilaterally. Heart: Regular rate and rhythm. No rubs, murmurs, or gallops. Abdomen: Soft, nontender, nondistended. No organomegaly noted, normoactive bowel sounds. Musculoskeletal: No edema, cyanosis, or clubbing. Neuro: Alert, answering all questions appropriately. Cranial nerves grossly intact. Skin: No rashes or petechiae noted. Psych: Normal affect.    LAB RESULTS:  Lab Results  Component Value Date   NA 137 09/02/2014   K 3.7 09/02/2014   CL 106  09/02/2014   CO2 25 09/02/2014   GLUCOSE 112* 09/02/2014   BUN 18 09/02/2014   CREATININE 1.43* 09/02/2014   CALCIUM 8.6* 09/02/2014   PROT 7.5 09/02/2014   ALBUMIN 4.0 09/02/2014   AST 27 09/02/2014   ALT 23 09/02/2014   ALKPHOS 84 09/02/2014   BILITOT 1.1 09/02/2014   GFRNONAA 51* 09/02/2014   GFRAA 59* 09/02/2014    Lab Results  Component Value Date   WBC 3.7* 09/02/2014   NEUTROABS 1.4 09/02/2014   HGB 13.9 09/02/2014   HCT 42.6 09/02/2014   MCV 89.2  09/02/2014   PLT 126* 09/02/2014     STUDIES: No results found.  ASSESSMENT: Stage IIIB colon cancer, DVT.  PLAN:    1. Colon cancer: Patient completed 12 cycles of FOLFOX on July 29, 2014. CEA is within normal limits. No further intervention is needed at this time. Patient has been instructed to keep his previously scheduled follow-up appointment for repeat laboratory work and further evaluation. He will require a repeat colonoscopy within 6-12 months of completing his treatments.  2. DVT: Coumadin has been discontinued. 3. Elevated creatinine: Mild, monitor. 4. Neuropathy: Patient was given a prescription for 300 mg gabapentin to take 3 times a day. 5. Survivorship: Patient's long-term follow up, potential side effects, as well as laboratory work and chemotherapy were all discussed at length and in detail with the patient.  Patient expressed understanding and was in agreement with this plan. He also understands that He can call clinic at any time with any questions, concerns, or complaints.   Colon cancer   Staging form: Neuroendocrine Tumor - Colon/Rectum, AJCC 7th Edition     Clinical stage from 05/04/2014: Stage IIIB (T4, N1, M0) - Signed by Lloyd Huger, MD on 05/04/2014   Lloyd Huger, MD   09/03/2014 9:56 AM

## 2014-09-07 ENCOUNTER — Other Ambulatory Visit: Payer: Self-pay

## 2014-09-07 MED ORDER — GABAPENTIN 300 MG PO CAPS: 300.0000 mg | ORAL_CAPSULE | Freq: Three times a day (TID) | ORAL | Status: AC

## 2014-09-08 ENCOUNTER — Other Ambulatory Visit: Payer: Self-pay | Admitting: *Deleted

## 2014-09-08 ENCOUNTER — Ambulatory Visit (INDEPENDENT_AMBULATORY_CARE_PROVIDER_SITE_OTHER): Payer: Medicaid Other | Admitting: Surgery

## 2014-09-08 ENCOUNTER — Encounter: Payer: Self-pay | Admitting: Surgery

## 2014-09-08 ENCOUNTER — Other Ambulatory Visit: Payer: Self-pay

## 2014-09-08 VITALS — BP 169/88 | HR 55 | Temp 98.2°F | Ht 73.0 in | Wt 231.0 lb

## 2014-09-08 DIAGNOSIS — C189 Malignant neoplasm of colon, unspecified: Secondary | ICD-10-CM

## 2014-09-08 NOTE — Progress Notes (Signed)
Surgery Progress Note  S: Doing well.  Chemo completed.  Good appetite.  Good ostomy function.  Some finger numbness since undergoing chemo O: Blood pressure 169/88, pulse 55, temperature 98.2 F (36.8 C), temperature source Oral, height _0  (1.854 m), weight 231 lb (104.781 kg). GEN: NAD/A&Ox3 ABD: soft, nontender, nondistended, ostomy pink and productive  A/P 64 yo s/p left hemicolectomy, doing well - will discuss with Dr. Grayland Ormond need for further imaging (ie PET) - colonoscopy - will plan on reversal if these criteria are met.

## 2014-09-08 NOTE — Patient Instructions (Signed)
Call or return to ER if you develop fever greater than 101.5, nausea/vomiting, increased pain, redness/drainage from incisions

## 2014-09-09 ENCOUNTER — Inpatient Hospital Stay: Payer: Medicaid Other

## 2014-09-09 DIAGNOSIS — C189 Malignant neoplasm of colon, unspecified: Secondary | ICD-10-CM

## 2014-09-09 LAB — COMPREHENSIVE METABOLIC PANEL
ALBUMIN: 3.9 g/dL (ref 3.5–5.0)
ALT: 20 U/L (ref 17–63)
AST: 21 U/L (ref 15–41)
Alkaline Phosphatase: 74 U/L (ref 38–126)
Anion gap: 4 — ABNORMAL LOW (ref 5–15)
BILIRUBIN TOTAL: 1.1 mg/dL (ref 0.3–1.2)
BUN: 16 mg/dL (ref 6–20)
CHLORIDE: 108 mmol/L (ref 101–111)
CO2: 28 mmol/L (ref 22–32)
CREATININE: 1.39 mg/dL — AB (ref 0.61–1.24)
Calcium: 8.7 mg/dL — ABNORMAL LOW (ref 8.9–10.3)
GFR calc Af Amer: >60 mL/min (ref >60)
GFR calc non Af Amer: 53 mL/min — ABNORMAL LOW (ref >60)
GLUCOSE: 100 mg/dL — AB (ref 65–99)
POTASSIUM: 3.8 mmol/L (ref 3.5–5.1)
Sodium: 140 mmol/L (ref 135–145)
TOTAL PROTEIN: 7.2 g/dL (ref 6.5–8.1)

## 2014-09-09 LAB — CBC WITH DIFFERENTIAL/PLATELET
BASOS ABS: 0 10*3/uL (ref 0–0.1)
BASOS PCT: 0 %
Eosinophils Absolute: 0.5 10*3/uL (ref 0–0.7)
Eosinophils Relative: 16 %
HEMATOCRIT: 42.6 % (ref 40.0–52.0)
Hemoglobin: 14 g/dL (ref 13.0–18.0)
LYMPHS PCT: 34 %
Lymphs Abs: 1.1 10*3/uL (ref 1.0–3.6)
MCH: 29.2 pg (ref 26.0–34.0)
MCHC: 32.9 g/dL (ref 32.0–36.0)
MCV: 88.7 fL (ref 80.0–100.0)
MONO ABS: 0.3 10*3/uL (ref 0.2–1.0)
Monocytes Relative: 9 %
NEUTROS ABS: 1.4 10*3/uL (ref 1.4–6.5)
NEUTROS PCT: 41 %
Platelets: 98 10*3/uL — ABNORMAL LOW (ref 150–440)
RBC: 4.8 MIL/uL (ref 4.40–5.90)
RDW: 17 % — AB (ref 11.5–14.5)
WBC: 3.3 10*3/uL — AB (ref 3.8–10.6)

## 2014-09-11 NOTE — Discharge Instructions (Signed)

## 2014-09-14 ENCOUNTER — Ambulatory Visit: Payer: Medicaid Other | Admitting: Anesthesiology

## 2014-09-14 ENCOUNTER — Ambulatory Visit
Admission: RE | Admit: 2014-09-14 | Discharge: 2014-09-14 | Disposition: A | Discharge status: Home / Self Care | Payer: Medicaid Other | Source: Ambulatory Visit | Attending: Gastroenterology | Admitting: Gastroenterology

## 2014-09-14 ENCOUNTER — Other Ambulatory Visit: Payer: Self-pay | Admitting: Gastroenterology

## 2014-09-14 ENCOUNTER — Encounter
Admission: RE | Disposition: A | Discharge status: Home / Self Care | Payer: Self-pay | Source: Ambulatory Visit | Attending: Gastroenterology

## 2014-09-14 ENCOUNTER — Encounter: Payer: Self-pay | Admitting: *Deleted

## 2014-09-14 DIAGNOSIS — R202 Paresthesia of skin: Secondary | ICD-10-CM | POA: Insufficient documentation

## 2014-09-14 DIAGNOSIS — Z79899 Other long term (current) drug therapy: Secondary | ICD-10-CM | POA: Insufficient documentation

## 2014-09-14 DIAGNOSIS — I10 Essential (primary) hypertension: Secondary | ICD-10-CM | POA: Insufficient documentation

## 2014-09-14 DIAGNOSIS — K641 Second degree hemorrhoids: Secondary | ICD-10-CM | POA: Insufficient documentation

## 2014-09-14 DIAGNOSIS — Z86718 Personal history of other venous thrombosis and embolism: Secondary | ICD-10-CM | POA: Insufficient documentation

## 2014-09-14 DIAGNOSIS — F17210 Nicotine dependence, cigarettes, uncomplicated: Secondary | ICD-10-CM | POA: Diagnosis not present

## 2014-09-14 DIAGNOSIS — D123 Benign neoplasm of transverse colon: Secondary | ICD-10-CM | POA: Diagnosis not present

## 2014-09-14 DIAGNOSIS — Z8249 Family history of ischemic heart disease and other diseases of the circulatory system: Secondary | ICD-10-CM | POA: Insufficient documentation

## 2014-09-14 DIAGNOSIS — D125 Benign neoplasm of sigmoid colon: Secondary | ICD-10-CM | POA: Diagnosis not present

## 2014-09-14 DIAGNOSIS — Z85038 Personal history of other malignant neoplasm of large intestine: Secondary | ICD-10-CM | POA: Diagnosis not present

## 2014-09-14 HISTORY — PX: COLONOSCOPY WITH PROPOFOL: SHX5780

## 2014-09-14 HISTORY — DX: Myoneural disorder, unspecified: G70.9

## 2014-09-14 HISTORY — DX: Personal history of antineoplastic chemotherapy: Z92.21

## 2014-09-14 SURGERY — COLONOSCOPY WITH PROPOFOL
Anesthesia: Monitor Anesthesia Care | Wound class: Contaminated

## 2014-09-14 MED ORDER — ACETAMINOPHEN 160 MG/5ML PO SOLN: 325.0000 mg | ORAL | Status: DC | PRN

## 2014-09-14 MED ORDER — LACTATED RINGERS IV SOLN
INTRAVENOUS | Status: DC
  Administered 2014-09-14: 09:00:00 via INTRAVENOUS

## 2014-09-14 MED ORDER — SODIUM CHLORIDE 0.9 % IV SOLN: INTRAVENOUS | Status: DC

## 2014-09-14 MED ORDER — PROPOFOL 10 MG/ML IV BOLUS
INTRAVENOUS | Status: DC | PRN
  Administered 2014-09-14: 30 mg via INTRAVENOUS
  Administered 2014-09-14 (×5): 20 mg via INTRAVENOUS
  Administered 2014-09-14: 60 mg via INTRAVENOUS

## 2014-09-14 MED ORDER — ACETAMINOPHEN 325 MG PO TABS: 325.0000 mg | ORAL_TABLET | ORAL | Status: DC | PRN

## 2014-09-14 MED ORDER — LIDOCAINE HCL (CARDIAC) 20 MG/ML IV SOLN
INTRAVENOUS | Status: DC | PRN
  Administered 2014-09-14: 50 mg via INTRAVENOUS

## 2014-09-14 SURGICAL SUPPLY — 28 items
CANISTER SUCT 1200ML W/VALVE (MISCELLANEOUS) ×2 IMPLANT
FCP ESCP3.2XJMB 240X2.8X (MISCELLANEOUS)
FORCEPS BIOP RAD 4 LRG CAP 4 (CUTTING FORCEPS) IMPLANT
FORCEPS BIOP RJ4 240 W/NDL (MISCELLANEOUS)
FORCEPS ESCP3.2XJMB 240X2.8X (MISCELLANEOUS) IMPLANT
GOWN CVR UNV OPN BCK APRN NK (MISCELLANEOUS) ×2 IMPLANT
GOWN ISOL THUMB LOOP REG UNIV (MISCELLANEOUS) ×2
HEMOCLIP INSTINCT (CLIP) IMPLANT
INJECTOR VARIJECT VIN23 (MISCELLANEOUS) IMPLANT
KIT CO2 TUBING (TUBING) IMPLANT
KIT DEFENDO VALVE AND CONN (KITS) IMPLANT
KIT ENDO PROCEDURE OLY (KITS) ×2 IMPLANT
LIGATOR MULTIBAND 6SHOOTER MBL (MISCELLANEOUS) IMPLANT
MARKER SPOT ENDO TATTOO 5ML (MISCELLANEOUS) IMPLANT
PAD GROUND ADULT SPLIT (MISCELLANEOUS) ×2 IMPLANT
SNARE SHORT THROW 13M SML OVAL (MISCELLANEOUS) IMPLANT
SNARE SHORT THROW 30M LRG OVAL (MISCELLANEOUS) IMPLANT
SPOT EX ENDOSCOPIC TATTOO (MISCELLANEOUS)
SUCTION POLY TRAP 4CHAMBER (MISCELLANEOUS) IMPLANT
TRAP SUCTION POLY (MISCELLANEOUS) ×2 IMPLANT
TUBING CONN 6MMX3.1M (TUBING)
TUBING SUCTION CONN 0.25 STRL (TUBING) IMPLANT
UNDERPAD 30X60 958B10 (PK) (MISCELLANEOUS) IMPLANT
VALVE BIOPSY ENDO (VALVE) IMPLANT
VARIJECT INJECTOR VIN23 (MISCELLANEOUS)
WATER AUXILLARY (MISCELLANEOUS) IMPLANT
WATER STERILE IRR 250ML POUR (IV SOLUTION) ×2 IMPLANT
WATER STERILE IRR 500ML POUR (IV SOLUTION) IMPLANT

## 2014-09-14 NOTE — Anesthesia Postprocedure Evaluation (Signed)
  Anesthesia Post-op Note  Patient: James Keller  Procedure(s) Performed: Procedure(s) with comments: COLONOSCOPY WITH PROPOFOL THROUGH COLOSTOMY AND COLONOSCOPY THRU RECTUM (N/A) - TRANSVERSE COLON POLYP AND SIGMOID COLON POLYP  Anesthesia type:MAC  Patient location: PACU  Post pain: Pain level controlled  Post assessment: Post-op Vital signs reviewed, Patient's Cardiovascular Status Stable, Respiratory Function Stable, Patent Airway and No signs of Nausea or vomiting  Post vital signs: Reviewed and stable  Last Vitals:  Filed Vitals:   09/14/14 1130  BP: 158/92  Pulse: 54  Temp:   Resp:     Level of consciousness: awake, alert  and patient cooperative  Complications: No apparent anesthesia complications

## 2014-09-14 NOTE — Transfer of Care (Signed)
Immediate Anesthesia Transfer of Care Note  Patient: James Keller  Procedure(s) Performed: Procedure(s) with comments: COLONOSCOPY WITH PROPOFOL THROUGH COLOSTOMY AND COLONOSCOPY THRU RECTUM (N/A) - TRANSVERSE COLON POLYP AND SIGMOID COLON POLYP  Patient Location: PACU  Anesthesia Type: MAC  Level of Consciousness: awake, alert  and patient cooperative  Airway and Oxygen Therapy: Patient Spontanous Breathing and Patient connected to supplemental oxygen  Post-op Assessment: Post-op Vital signs reviewed, Patient's Cardiovascular Status Stable, Respiratory Function Stable, Patent Airway and No signs of Nausea or vomiting  Post-op Vital Signs: Reviewed and stable  Complications: No apparent anesthesia complications

## 2014-09-14 NOTE — Anesthesia Preprocedure Evaluation (Signed)
Anesthesia Evaluation  Patient identified by MRN, date of birth, ID band  Reviewed: Allergy & Precautions, H&P , NPO status , Patient's Chart, lab work & pertinent test results  Airway Mallampati: II  TM Distance: >3 FB Neck ROM: full    Dental no notable dental hx. (+) Edentulous Upper, Edentulous Lower   Pulmonary Current Smoker,    Pulmonary exam normal       Cardiovascular hypertension, Rhythm:regular Rate:Normal     Neuro/Psych    GI/Hepatic   Endo/Other    Renal/GU      Musculoskeletal   Abdominal   Peds  Hematology   Anesthesia Other Findings Colostomy secondary to colon cancer  Reproductive/Obstetrics                             Anesthesia Physical Anesthesia Plan  ASA: III  Anesthesia Plan: MAC   Post-op Pain Management:    Induction:   Airway Management Planned:   Additional Equipment:   Intra-op Plan:   Post-operative Plan:   Informed Consent: I have reviewed the patients History and Physical, chart, labs and discussed the procedure including the risks, benefits and alternatives for the proposed anesthesia with the patient or authorized representative who has indicated his/her understanding and acceptance.     Plan Discussed with: CRNA  Anesthesia Plan Comments:         Anesthesia Quick Evaluation

## 2014-09-14 NOTE — H&P (Signed)
  [facility]  [address], [place], [place] [address] Phone: [phone] Fax : [phone]  Primary Care Physician:  Loistine Chance, MD Primary Gastroenterologist:  Dr. Allen Norris  Pre-Procedure History & Physical: HPI:  James Keller is a 64 y.o. male is here for an colonoscopy.   Past Medical History  Diagnosis Date  . DVT (deep venous thrombosis)     RIGHT LEG/ HX OF  . Colon cancer     colon/CHEMO THERAPY  . S/P chemotherapy, time since less than 4 weeks   . Neuromuscular disorder     TINGLING IN FINGERS  . Hypertension     Past Surgical History  Procedure Laterality Date  . Colon resection  12/19/2013    colostomy  . Leg surgery    . Tunneled venous port placement      Prior to Admission medications   Medication Sig Start Date End Date Taking? Authorizing Provider  gabapentin (NEURONTIN) 300 MG capsule Take 1 capsule (300 mg total) by mouth 3 (three) times daily. 09/07/14  Yes Lloyd Huger, MD  metoprolol tartrate (LOPRESSOR) 25 MG tablet Take 25 mg by mouth 2 (two) times daily. Am and pm 12/27/13  Yes Historical Provider, MD  nicotine (NICODERM CQ - DOSED IN MG/24 HOURS) 14 mg/24hr patch Place 14 mg onto the skin as needed.    Yes Historical Provider, MD    Allergies as of 09/08/2014 - Review Complete 09/08/2014  Allergen Reaction Noted  . No known allergies  04/25/2014    Family History  Problem Relation Age of Onset  . Hypertension Mother     Social History   Social History  . Marital Status: Single    Spouse Name: N/A  . Number of Children: N/A  . Years of Education: N/A   Occupational History  . Not on file.   Social History Main Topics  . Smoking status: Current Some Day Smoker -- 0.50 packs/day for 54 years    Types: Cigarettes  . Smokeless tobacco: Never Used  . Alcohol Use: 3.6 oz/week    6 Cans of beer per week     Comment: moderate  . Drug Use: Yes    Special: Marijuana     Comment: occasional  .  Sexual Activity: Not on file   Other Topics Concern  . Not on file   Social History Narrative    Review of Systems: See HPI, otherwise negative ROS  Physical Exam: BP 139/93 mmHg  Pulse 50  Temp(Src) 97.7 F (36.5 C)  Resp 16  Ht 6\' 1"  (1.854 m)  Wt 223 lb (101.152 kg)  BMI 29.43 kg/m2  SpO2 99% General:   Alert,  pleasant and cooperative in NAD Head:  Normocephalic and atraumatic. Neck:  Supple; no masses or thyromegaly. Lungs:  Clear throughout to auscultation.    Heart:  Regular rate and rhythm. Abdomen:  Soft, nontender and nondistended. Normal bowel sounds, without guarding, and without rebound.   Neurologic:  Alert and  oriented x4;  grossly normal neurologically.  Impression/Plan: [name] is here for an colonoscopy to be performed for history of colon cancer  Risks, benefits, limitations, and alternatives regarding  colonoscopy have been reviewed with the patient.  Questions have been answered.  All parties agreeable.   Ollen Bowl, MD  09/14/2014, 10:37 AM

## 2014-09-14 NOTE — Op Note (Signed)
[facility] Patient Name: James Keller Procedure Date: 09/14/2014 10:40 AM MRN: [id number] Account #: [id number] Date of Birth: 1950-12-21 Admit Type: Outpatient Age: 64 Room: [facility] OR ROOM 01 Gender: Male Note Status: Finalized Procedure:         Colonoscopy Indications:       High risk colon cancer surveillance: Personal history of                     colon cancer, Incidental - Preoperative assessment Providers:         Lucilla Lame, MD Referring MD:      Bethena Roys. Sowles, MD (Referring MD) Medicines:         Propofol per Anesthesia Complications:     No immediate complications. Procedure:         Pre-Anesthesia Assessment:                    - Prior to the procedure, a History and Physical was                     performed, and patient medications and allergies were                     reviewed. The patient's tolerance of previous anesthesia                     was also reviewed. The risks and benefits of the procedure                     and the sedation options and risks were discussed with the                     patient. All questions were answered, and informed consent                     was obtained. Prior Anticoagulants: The patient has taken                     no previous anticoagulant or antiplatelet agents. ASA                     Grade Assessment: II - A patient with mild systemic                     disease. After reviewing the risks and benefits, the                     patient was deemed in satisfactory condition to undergo                     the procedure.                    After obtaining informed consent, the colonoscope was                     passed under direct vision. Throughout the procedure, the                     patient's blood pressure, pulse, and oxygen saturations                     were monitored continuously. The Olympus PCF H180AL  colonoscope (S#: [id number]) was introduced through the                    cecostomy and advanced to the the cecum, identified by                     appendiceal orifice and ileocecal valve. The Olympus PCF                     H180AL colonoscope (S#: [id number]) was introduced through                     the anus and advanced to the the sigmoid colon to examine                     an anastomosis. This was the intended extent. The                     colonoscopy was performed without difficulty. The patient                     tolerated the procedure well. The quality of the bowel                     preparation was excellent. Findings:      The perianal and digital rectal examinations were normal.      A 7 mm polyp was found in the sigmoid colon. The polyp was sessile. The       polyp was removed with a hot snare. Resection and retrieval were       complete.      A 8 mm polyp was found in the transverse colon. The polyp was       pedunculated. The polyp was removed with a hot snare. Resection and       retrieval were complete.      Non-bleeding internal hemorrhoids were found during retroflexion. The       hemorrhoids were Grade II (internal hemorrhoids that prolapse but reduce       spontaneously). Impression:        - One 7 mm polyp in the sigmoid colon. Resected and                     retrieved.                    - One 8 mm polyp in the transverse colon. Resected and                     retrieved.                    - Non-bleeding internal hemorrhoids. Recommendation:    - Await pathology results.                    - Repeat colonoscopy in 3 years for surveillance. Procedure Code(s): --- Professional ---                    [phone], Colonoscopy through stoma; with removal of                     tumor(s), polyp(s), or other lesion(s) by snare technique Diagnosis Code(s): --- Professional ---                    I32.549, Personal  history of other malignant neoplasm of                     large intestine                    D12.5, Benign neoplasm  of sigmoid colon                    D12.3, Benign neoplasm of transverse colon CPT copyright 2014 American Medical Association. All rights reserved. The codes documented in this report are preliminary and upon coder review may  be revised to meet current compliance requirements. Lucilla Lame, MD 09/14/2014 11:14:03 AM This report has been signed electronically. Number of Addenda: 0 Note Initiated On: 09/14/2014 10:40 AM Scope Withdrawal Time: 0 hours 8 minutes 33 seconds  Total Procedure Duration: 0 hours 13 minutes 32 seconds       [facility]

## 2014-09-15 ENCOUNTER — Encounter: Payer: Self-pay | Admitting: Gastroenterology

## 2014-09-16 ENCOUNTER — Inpatient Hospital Stay: Payer: Medicaid Other

## 2014-09-16 DIAGNOSIS — C189 Malignant neoplasm of colon, unspecified: Secondary | ICD-10-CM | POA: Diagnosis not present

## 2014-09-16 LAB — COMPREHENSIVE METABOLIC PANEL
ALT: 24 U/L (ref 17–63)
ANION GAP: 5 (ref 5–15)
AST: 22 U/L (ref 15–41)
Albumin: 3.9 g/dL (ref 3.5–5.0)
Alkaline Phosphatase: 76 U/L (ref 38–126)
BILIRUBIN TOTAL: 1.1 mg/dL (ref 0.3–1.2)
BUN: 17 mg/dL (ref 6–20)
CHLORIDE: 106 mmol/L (ref 101–111)
CO2: 26 mmol/L (ref 22–32)
Calcium: 8.6 mg/dL — ABNORMAL LOW (ref 8.9–10.3)
Creatinine, Ser: 1.72 mg/dL — ABNORMAL HIGH (ref 0.61–1.24)
GFR, EST AFRICAN AMERICAN: 47 mL/min — AB (ref >60)
GFR, EST NON AFRICAN AMERICAN: 41 mL/min — AB (ref >60)
Glucose, Bld: 99 mg/dL (ref 65–99)
POTASSIUM: 4.1 mmol/L (ref 3.5–5.1)
Sodium: 137 mmol/L (ref 135–145)
TOTAL PROTEIN: 7.2 g/dL (ref 6.5–8.1)

## 2014-09-16 LAB — CBC WITH DIFFERENTIAL/PLATELET
BASOS ABS: 0.1 10*3/uL (ref 0–0.1)
Basophils Relative: 1 %
EOS PCT: 14 %
Eosinophils Absolute: 0.6 10*3/uL (ref 0–0.7)
HEMATOCRIT: 43.3 % (ref 40.0–52.0)
Hemoglobin: 14.4 g/dL (ref 13.0–18.0)
LYMPHS ABS: 1.3 10*3/uL (ref 1.0–3.6)
LYMPHS PCT: 29 %
MCH: 29.2 pg (ref 26.0–34.0)
MCHC: 33.2 g/dL (ref 32.0–36.0)
MCV: 88.1 fL (ref 80.0–100.0)
MONO ABS: 0.4 10*3/uL (ref 0.2–1.0)
MONOS PCT: 9 %
Neutro Abs: 2.1 10*3/uL (ref 1.4–6.5)
Neutrophils Relative %: 47 %
PLATELETS: 116 10*3/uL — AB (ref 150–440)
RBC: 4.91 MIL/uL (ref 4.40–5.90)
RDW: 16.5 % — AB (ref 11.5–14.5)
WBC: 4.4 10*3/uL (ref 3.8–10.6)

## 2014-09-21 ENCOUNTER — Encounter: Payer: Self-pay | Admitting: Gastroenterology

## 2014-09-23 ENCOUNTER — Inpatient Hospital Stay: Payer: Medicaid Other

## 2014-09-23 DIAGNOSIS — C189 Malignant neoplasm of colon, unspecified: Secondary | ICD-10-CM

## 2014-09-23 LAB — COMPREHENSIVE METABOLIC PANEL
ALBUMIN: 3.9 g/dL (ref 3.5–5.0)
ALK PHOS: 77 U/L (ref 38–126)
ALT: 22 U/L (ref 17–63)
ANION GAP: 5 (ref 5–15)
AST: 26 U/L (ref 15–41)
BUN: 18 mg/dL (ref 6–20)
CALCIUM: 8.6 mg/dL — AB (ref 8.9–10.3)
CHLORIDE: 106 mmol/L (ref 101–111)
CO2: 26 mmol/L (ref 22–32)
Creatinine, Ser: 1.42 mg/dL — ABNORMAL HIGH (ref 0.61–1.24)
GFR calc non Af Amer: 51 mL/min — ABNORMAL LOW (ref >60)
GFR, EST AFRICAN AMERICAN: 60 mL/min — AB (ref >60)
GLUCOSE: 107 mg/dL — AB (ref 65–99)
POTASSIUM: 3.9 mmol/L (ref 3.5–5.1)
SODIUM: 137 mmol/L (ref 135–145)
Total Bilirubin: 0.8 mg/dL (ref 0.3–1.2)
Total Protein: 7.3 g/dL (ref 6.5–8.1)

## 2014-09-23 LAB — CBC WITH DIFFERENTIAL/PLATELET
BASOS PCT: 1 %
Basophils Absolute: 0 10*3/uL (ref 0–0.1)
EOS ABS: 0.4 10*3/uL (ref 0–0.7)
EOS PCT: 13 %
HCT: 45.6 % (ref 40.0–52.0)
Hemoglobin: 15 g/dL (ref 13.0–18.0)
LYMPHS ABS: 1.1 10*3/uL (ref 1.0–3.6)
Lymphocytes Relative: 36 %
MCH: 28.9 pg (ref 26.0–34.0)
MCHC: 32.8 g/dL (ref 32.0–36.0)
MCV: 87.9 fL (ref 80.0–100.0)
MONO ABS: 0.3 10*3/uL (ref 0.2–1.0)
MONOS PCT: 10 %
NEUTROS PCT: 40 %
Neutro Abs: 1.2 10*3/uL — ABNORMAL LOW (ref 1.4–6.5)
PLATELETS: 144 10*3/uL — AB (ref 150–440)
RBC: 5.19 MIL/uL (ref 4.40–5.90)
RDW: 16.4 % — AB (ref 11.5–14.5)
WBC: 3 10*3/uL — ABNORMAL LOW (ref 3.8–10.6)

## 2014-09-30 ENCOUNTER — Ambulatory Visit
Admission: RE | Admit: 2014-09-30 | Discharge: 2014-09-30 | Disposition: A | Discharge status: Home / Self Care | Payer: Medicaid Other | Source: Ambulatory Visit | Attending: Oncology | Admitting: Oncology

## 2014-09-30 ENCOUNTER — Inpatient Hospital Stay: Payer: Medicaid Other | Attending: Oncology

## 2014-09-30 ENCOUNTER — Inpatient Hospital Stay: Payer: Medicaid Other

## 2014-09-30 DIAGNOSIS — Z9221 Personal history of antineoplastic chemotherapy: Secondary | ICD-10-CM | POA: Insufficient documentation

## 2014-09-30 DIAGNOSIS — Z933 Colostomy status: Secondary | ICD-10-CM | POA: Insufficient documentation

## 2014-09-30 DIAGNOSIS — Z86718 Personal history of other venous thrombosis and embolism: Secondary | ICD-10-CM | POA: Insufficient documentation

## 2014-09-30 DIAGNOSIS — I719 Aortic aneurysm of unspecified site, without rupture: Secondary | ICD-10-CM | POA: Diagnosis not present

## 2014-09-30 DIAGNOSIS — N289 Disorder of kidney and ureter, unspecified: Secondary | ICD-10-CM | POA: Insufficient documentation

## 2014-09-30 DIAGNOSIS — F17210 Nicotine dependence, cigarettes, uncomplicated: Secondary | ICD-10-CM | POA: Insufficient documentation

## 2014-09-30 DIAGNOSIS — Z08 Encounter for follow-up examination after completed treatment for malignant neoplasm: Secondary | ICD-10-CM | POA: Diagnosis present

## 2014-09-30 DIAGNOSIS — K429 Umbilical hernia without obstruction or gangrene: Secondary | ICD-10-CM | POA: Insufficient documentation

## 2014-09-30 DIAGNOSIS — R944 Abnormal results of kidney function studies: Secondary | ICD-10-CM | POA: Insufficient documentation

## 2014-09-30 DIAGNOSIS — Z85038 Personal history of other malignant neoplasm of large intestine: Secondary | ICD-10-CM | POA: Diagnosis not present

## 2014-09-30 DIAGNOSIS — C189 Malignant neoplasm of colon, unspecified: Secondary | ICD-10-CM

## 2014-09-30 DIAGNOSIS — Z9049 Acquired absence of other specified parts of digestive tract: Secondary | ICD-10-CM | POA: Insufficient documentation

## 2014-09-30 DIAGNOSIS — Z79899 Other long term (current) drug therapy: Secondary | ICD-10-CM | POA: Insufficient documentation

## 2014-09-30 DIAGNOSIS — I10 Essential (primary) hypertension: Secondary | ICD-10-CM | POA: Insufficient documentation

## 2014-09-30 DIAGNOSIS — G629 Polyneuropathy, unspecified: Secondary | ICD-10-CM | POA: Insufficient documentation

## 2014-09-30 MED ORDER — IOHEXOL 300 MG/ML  SOLN
100.0000 mL | Freq: Once | INTRAMUSCULAR | Status: AC | PRN
  Administered 2014-09-30: 100 mL via INTRAVENOUS

## 2014-10-01 ENCOUNTER — Other Ambulatory Visit: Payer: Self-pay | Admitting: Oncology

## 2014-10-07 ENCOUNTER — Inpatient Hospital Stay (HOSPITAL_BASED_OUTPATIENT_CLINIC_OR_DEPARTMENT_OTHER): Payer: Medicaid Other | Admitting: Oncology

## 2014-10-07 ENCOUNTER — Encounter: Payer: Self-pay | Admitting: *Deleted

## 2014-10-07 ENCOUNTER — Inpatient Hospital Stay: Payer: Medicaid Other

## 2014-10-07 VITALS — BP 177/99 | HR 50 | Temp 96.0°F | Resp 20 | Wt 232.8 lb

## 2014-10-07 DIAGNOSIS — G629 Polyneuropathy, unspecified: Secondary | ICD-10-CM | POA: Diagnosis not present

## 2014-10-07 DIAGNOSIS — N2889 Other specified disorders of kidney and ureter: Secondary | ICD-10-CM

## 2014-10-07 DIAGNOSIS — Z9221 Personal history of antineoplastic chemotherapy: Secondary | ICD-10-CM | POA: Diagnosis not present

## 2014-10-07 DIAGNOSIS — Z85038 Personal history of other malignant neoplasm of large intestine: Secondary | ICD-10-CM

## 2014-10-07 DIAGNOSIS — I719 Aortic aneurysm of unspecified site, without rupture: Secondary | ICD-10-CM

## 2014-10-07 DIAGNOSIS — Z9049 Acquired absence of other specified parts of digestive tract: Secondary | ICD-10-CM | POA: Diagnosis not present

## 2014-10-07 DIAGNOSIS — F17210 Nicotine dependence, cigarettes, uncomplicated: Secondary | ICD-10-CM

## 2014-10-07 DIAGNOSIS — R944 Abnormal results of kidney function studies: Secondary | ICD-10-CM | POA: Diagnosis not present

## 2014-10-07 DIAGNOSIS — N289 Disorder of kidney and ureter, unspecified: Secondary | ICD-10-CM | POA: Diagnosis not present

## 2014-10-07 DIAGNOSIS — Z79899 Other long term (current) drug therapy: Secondary | ICD-10-CM | POA: Diagnosis not present

## 2014-10-07 DIAGNOSIS — Z86718 Personal history of other venous thrombosis and embolism: Secondary | ICD-10-CM

## 2014-10-07 DIAGNOSIS — I10 Essential (primary) hypertension: Secondary | ICD-10-CM

## 2014-10-07 DIAGNOSIS — C189 Malignant neoplasm of colon, unspecified: Secondary | ICD-10-CM

## 2014-10-07 LAB — COMPREHENSIVE METABOLIC PANEL
ALBUMIN: 4 g/dL (ref 3.5–5.0)
ALK PHOS: 80 U/L (ref 38–126)
ALT: 25 U/L (ref 17–63)
ANION GAP: 6 (ref 5–15)
AST: 27 U/L (ref 15–41)
BILIRUBIN TOTAL: 0.9 mg/dL (ref 0.3–1.2)
BUN: 19 mg/dL (ref 6–20)
CALCIUM: 9.7 mg/dL (ref 8.9–10.3)
CO2: 29 mmol/L (ref 22–32)
Chloride: 106 mmol/L (ref 101–111)
Creatinine, Ser: 1.59 mg/dL — ABNORMAL HIGH (ref 0.61–1.24)
GFR calc Af Amer: 52 mL/min — ABNORMAL LOW (ref >60)
GFR, EST NON AFRICAN AMERICAN: 45 mL/min — AB (ref >60)
GLUCOSE: 94 mg/dL (ref 65–99)
Potassium: 4 mmol/L (ref 3.5–5.1)
Sodium: 141 mmol/L (ref 135–145)
TOTAL PROTEIN: 7 g/dL (ref 6.5–8.1)

## 2014-10-07 LAB — CBC WITH DIFFERENTIAL/PLATELET
BASOS PCT: 1 %
Basophils Absolute: 0 10*3/uL (ref 0–0.1)
Eosinophils Absolute: 0.6 10*3/uL (ref 0–0.7)
Eosinophils Relative: 18 %
HEMATOCRIT: 44.1 % (ref 40.0–52.0)
HEMOGLOBIN: 14.5 g/dL (ref 13.0–18.0)
LYMPHS PCT: 33 %
Lymphs Abs: 1.1 10*3/uL (ref 1.0–3.6)
MCH: 28.9 pg (ref 26.0–34.0)
MCHC: 32.8 g/dL (ref 32.0–36.0)
MCV: 88 fL (ref 80.0–100.0)
MONO ABS: 0.3 10*3/uL (ref 0.2–1.0)
MONOS PCT: 9 %
NEUTROS ABS: 1.3 10*3/uL — AB (ref 1.4–6.5)
NEUTROS PCT: 39 %
Platelets: 110 10*3/uL — ABNORMAL LOW (ref 150–440)
RBC: 5.01 MIL/uL (ref 4.40–5.90)
RDW: 15.6 % — AB (ref 11.5–14.5)
WBC: 3.3 10*3/uL — ABNORMAL LOW (ref 3.8–10.6)

## 2014-10-07 NOTE — Progress Notes (Signed)
Patient here today for follow up regarding colon cancer, CT results. Patient denies any concerns today.

## 2014-10-07 NOTE — Progress Notes (Signed)
Note and copy of recent CT scan faxed to Dr. Melvenia Needles office, patient to be scheduled for appointment to discuss colostomy reversal.

## 2014-10-13 ENCOUNTER — Ambulatory Visit: Payer: Medicaid Other | Admitting: Surgery

## 2014-10-13 ENCOUNTER — Ambulatory Visit (INDEPENDENT_AMBULATORY_CARE_PROVIDER_SITE_OTHER): Payer: Medicaid Other | Admitting: Surgery

## 2014-10-13 ENCOUNTER — Encounter: Payer: Self-pay | Admitting: Surgery

## 2014-10-13 VITALS — BP 159/93 | HR 59 | Temp 97.5°F | Ht 72.0 in | Wt 237.0 lb

## 2014-10-13 DIAGNOSIS — C189 Malignant neoplasm of colon, unspecified: Secondary | ICD-10-CM

## 2014-10-13 NOTE — Patient Instructions (Signed)
Follow up in office after MRI to review results.

## 2014-10-13 NOTE — Progress Notes (Signed)
Surgery [facility]  CC: Eval for colostomy reversal  HPI: James Keller is a pleasant 64 yo M with a history of hartmanns procedure for obstructing colon cancer.  Has been doing well.  Post chemotherapy.  Repeat CT post chemo shows no obvious recurrence of metastatic disease.  Colonoscopy significant for polyp which was excised and returned as tubular adenoma.  CT also shows thickened renal cyst so is undergoing MRI to evaluate.  No fevers/chills, night sweats, shortness of breath, cough, chest pain, abdominal pain, nausea/vomiting, diarrhea/constipation, dysuria/hematuria.  Active Ambulatory Problems    Diagnosis Date Noted  . Colon cancer   . DVT (deep venous thrombosis)   . Personal history of colon cancer   . Benign neoplasm of sigmoid colon   . Benign neoplasm of transverse colon   . Bradycardia 02/02/2014  . A-fib 01/30/2014  . Essential (primary) hypertension 01/30/2014   Resolved Ambulatory Problems    Diagnosis Date Noted  . No Resolved Ambulatory Problems   Past Medical History  Diagnosis Date  . S/P chemotherapy, time since less than 4 weeks   . Neuromuscular disorder   . Hypertension    Past Surgical History  Procedure Laterality Date  . Colon resection  12/19/2013    colostomy  . Leg surgery    . Tunneled venous port placement    . Colonoscopy with propofol N/A 09/14/2014    Procedure: COLONOSCOPY WITH PROPOFOL THROUGH COLOSTOMY AND COLONOSCOPY THRU RECTUM;  Surgeon: Lucilla Lame, MD;  Location: [facility];  Service: Endoscopy;  Laterality: N/A;  TRANSVERSE COLON POLYP AND SIGMOID COLON POLYP     Medication List       This list is accurate as of: 10/13/14 11:22 AM.  Always use your most recent med list.               gabapentin 300 MG capsule  Commonly known as:  NEURONTIN  Take 1 capsule (300 mg total) by mouth 3 (three) times daily.     metoprolol tartrate 25 MG tablet  Commonly known as:  LOPRESSOR  Take 25 mg by mouth 2 (two) times daily. Am and  pm     nicotine 14 mg/24hr patch  Commonly known as:  NICODERM CQ - dosed in mg/24 hours  Place 14 mg onto the skin as needed.       Allergies  Allergen Reactions  . No Known Allergies    Family History  Problem Relation Age of Onset  . Hypertension Mother    Social History   Social History  . Marital Status: Single    Spouse Name: N/A  . Number of Children: N/A  . Years of Education: N/A   Occupational History  . Not on file.   Social History Main Topics  . Smoking status: Current Some Day Smoker -- 0.50 packs/day for 54 years    Types: Cigarettes  . Smokeless tobacco: Never Used  . Alcohol Use: 3.6 oz/week    6 Cans of beer per week     Comment: moderate  . Drug Use: Yes    Special: Marijuana     Comment: occasional  . Sexual Activity: Not on file   Other Topics Concern  . Not on file   Social History Narrative   ROS: Full ROS obtained, pertinent positives and negatives as above.  Blood pressure 159/93, pulse 59, temperature 97.5 F (36.4 C), temperature source Oral, height 6' (1.829 m), weight 237 lb (107.502 kg). GEN: NAD/A&Ox3 FACE: no obvious facial  trauma, normal external nose, normal external ears EYES: no scleral icterus, no conjunctivitis HEAD: normocephalic atraumatic CV: RRR, no MRG RESP: moving air well, lungs clear ABD: soft, nontender, nondistended, ostomy pink EXT: moving all ext well, strength 5/5 NEURO: cnII-XII grossly intact, sensation intact all 4 ext  Labs: reviewed, significant for  WBC 3.3, Cr 1.59  CT: Reviewed, significant for no obvious metastatic disease or recurrence - right lower kidney lesion  A/P 64 yo s/p colectomy for neoplasm s/p chemotherapy.  I have discussed ostomy reversal with [name] and its risks and benefits including leak, postop infection, hernia.  He would like to proceed.  Will await results of MRI of kidney to ensure that this does not have to be addressed.  He agrees with this plan.

## 2014-10-13 NOTE — H&P (Signed)
Surgery H and P  CC: Eval for colostomy reversal  HPI: [name] is a pleasant 64 yo M with a history of hartmanns procedure for obstructing colon cancer.  Has been doing well.  Post chemotherapy.  Repeat CT post chemo shows no obvious recurrence of metastatic disease.  Colonoscopy significant for polyp which was excised and returned as tubular adenoma.  CT also shows thickened renal cyst so is undergoing MRI to evaluate.  No fevers/chills, night sweats, shortness of breath, cough, chest pain, abdominal pain, nausea/vomiting, diarrhea/constipation, dysuria/hematuria.  Active Ambulatory Problems    Diagnosis Date Noted  . Colon cancer   . DVT (deep venous thrombosis)   . Personal history of colon cancer   . Benign neoplasm of sigmoid colon   . Benign neoplasm of transverse colon   . Bradycardia 02/02/2014  . A-fib 01/30/2014  . Essential (primary) hypertension 01/30/2014   Resolved Ambulatory Problems    Diagnosis Date Noted  . No Resolved Ambulatory Problems   Past Medical History  Diagnosis Date  . S/P chemotherapy, time since less than 4 weeks   . Neuromuscular disorder   . Hypertension    Past Surgical History  Procedure Laterality Date  . Colon resection  12/19/2013    colostomy  . Leg surgery    . Tunneled venous port placement    . Colonoscopy with propofol N/A 09/14/2014    Procedure: COLONOSCOPY WITH PROPOFOL THROUGH COLOSTOMY AND COLONOSCOPY THRU RECTUM;  Surgeon: Lucilla Lame, MD;  Location: [facility];  Service: Endoscopy;  Laterality: N/A;  TRANSVERSE COLON POLYP AND SIGMOID COLON POLYP     Medication List       This list is accurate as of: 10/13/14 11:22 AM.  Always use your most recent med list.               gabapentin 300 MG capsule  Commonly known as:  NEURONTIN  Take 1 capsule (300 mg total) by mouth 3 (three) times daily.     metoprolol tartrate 25 MG tablet  Commonly known as:  LOPRESSOR  Take 25 mg by mouth 2 (two) times daily. Am and  pm     nicotine 14 mg/24hr patch  Commonly known as:  NICODERM CQ - dosed in mg/24 hours  Place 14 mg onto the skin as needed.       Allergies  Allergen Reactions  . No Known Allergies    Family History  Problem Relation Age of Onset  . Hypertension Mother    Social History   Social History  . Marital Status: Single    Spouse Name: N/A  . Number of Children: N/A  . Years of Education: N/A   Occupational History  . Not on file.   Social History Main Topics  . Smoking status: Current Some Day Smoker -- 0.50 packs/day for 54 years    Types: Cigarettes  . Smokeless tobacco: Never Used  . Alcohol Use: 3.6 oz/week    6 Cans of beer per week     Comment: moderate  . Drug Use: Yes    Special: Marijuana     Comment: occasional  . Sexual Activity: Not on file   Other Topics Concern  . Not on file   Social History Narrative   ROS: Full ROS obtained, pertinent positives and negatives as above.  Blood pressure 159/93, pulse 59, temperature 97.5 F (36.4 C), temperature source Oral, height 6' (1.829 m), weight 237 lb (107.502 kg). GEN: NAD/A&Ox3 FACE: no obvious facial  trauma, normal external nose, normal external ears EYES: no scleral icterus, no conjunctivitis HEAD: normocephalic atraumatic CV: RRR, no MRG RESP: moving air well, lungs clear ABD: soft, nontender, nondistended, ostomy pink EXT: moving all ext well, strength 5/5 NEURO: cnII-XII grossly intact, sensation intact all 4 ext  Labs: reviewed, significant for  WBC 3.3, Cr 1.59  CT: Reviewed, significant for no obvious metastatic disease or recurrence - right lower kidney lesion  A/P 64 yo s/p colectomy for neoplasm s/p chemotherapy.  I have discussed ostomy reversal with [name] and its risks and benefits including leak, postop infection, hernia.  He would like to proceed.  Will await results of MRI of kidney to ensure that this does not have to be addressed.  He agrees with this plan.

## 2014-10-14 ENCOUNTER — Inpatient Hospital Stay: Payer: Medicaid Other

## 2014-10-15 ENCOUNTER — Ambulatory Visit: Payer: Medicaid Other | Admitting: Oncology

## 2014-10-15 ENCOUNTER — Other Ambulatory Visit: Payer: Self-pay | Admitting: Surgery

## 2014-10-15 ENCOUNTER — Other Ambulatory Visit: Payer: Medicaid Other

## 2014-10-15 DIAGNOSIS — C189 Malignant neoplasm of colon, unspecified: Secondary | ICD-10-CM

## 2014-10-15 MED ORDER — BISACODYL 5 MG PO TBEC: 5.0000 mg | DELAYED_RELEASE_TABLET | Freq: Once | ORAL | Status: DC

## 2014-10-15 MED ORDER — POLYETHYLENE GLYCOL 3350 17 GM/SCOOP PO POWD: 1.0000 | Freq: Once | ORAL | Status: DC

## 2014-10-16 ENCOUNTER — Telehealth: Payer: Self-pay | Admitting: Surgery

## 2014-10-16 NOTE — Telephone Encounter (Signed)
Pt advised of pre op date/time and sx date. Sx: 10/28/14 with Dr Rexene Edison, Dr Genevive Bi to assist--Ostomy Reversal Pre op: 10/19/14 @ 10:00am.

## 2014-10-16 NOTE — Progress Notes (Signed)
[facility]  [phone] [phone]) [phone]  ID: STEADMAN PROSPERI OB: 1951-01-07  MR#: [id number]  VPX#:[id number]  Patient Care Team: Steele Sizer, MD as PCP - General (Family Medicine) Marlyce Huge, MD as Attending Physician (Surgery)  CHIEF COMPLAINT:  Chief Complaint  Patient presents with  . Colon Cancer    follow up    INTERVAL HISTORY: Patient returns to clinic today for further evaluation and discussion of his CT scan results. He is to have a persistent peripheral neuropathy, but this is mildly improved.  He otherwise feels well. He does not complain of scrotal swelling. He denies any fevers. He has no other neurologic complaints. He has no chest pain or shortness of breath. He denies any weight loss. He denies any easy bleeding or bruising. He has no nausea, vomiting, or constipation. Patient offers no further specific complaints today.   REVIEW OF SYSTEMS:   Review of Systems  Constitutional: Negative.   Genitourinary: Negative for urgency, frequency and hematuria.  Neurological: Positive for sensory change.    As per HPI. Otherwise, a complete review of systems is negatve.  PAST MEDICAL HISTORY: Past Medical History  Diagnosis Date  . DVT (deep venous thrombosis)     RIGHT LEG/ HX OF  . Colon cancer     colon/CHEMO THERAPY  . S/P chemotherapy, time since less than 4 weeks   . Neuromuscular disorder     TINGLING IN FINGERS  . Hypertension     PAST SURGICAL HISTORY: Past Surgical History  Procedure Laterality Date  . Colon resection  12/19/2013    colostomy  . Leg surgery    . Tunneled venous port placement    . Colonoscopy with propofol N/A 09/14/2014    Procedure: COLONOSCOPY WITH PROPOFOL THROUGH COLOSTOMY AND COLONOSCOPY THRU RECTUM;  Surgeon: Lucilla Lame, MD;  Location: [facility];  Service: Endoscopy;  Laterality: N/A;  TRANSVERSE COLON POLYP AND SIGMOID COLON POLYP    FAMILY HISTORY: Grandmother  with "liver cancer".     ADVANCED DIRECTIVES:    HEALTH MAINTENANCE: Social History  Substance Use Topics  . Smoking status: Current Some Day Smoker -- 0.50 packs/day for 54 years    Types: Cigarettes  . Smokeless tobacco: Never Used  . Alcohol Use: 3.6 oz/week    6 Cans of beer per week     Comment: moderate     Colonoscopy:  PAP:  Bone density:  Lipid panel:  Allergies  Allergen Reactions  . No Known Allergies     Current Outpatient Prescriptions  Medication Sig Dispense Refill  . gabapentin (NEURONTIN) 300 MG capsule Take 1 capsule (300 mg total) by mouth 3 (three) times daily. 90 capsule 1  . metoprolol tartrate (LOPRESSOR) 25 MG tablet Take 25 mg by mouth 2 (two) times daily. Am and pm    . nicotine (NICODERM CQ - DOSED IN MG/24 HOURS) 14 mg/24hr patch Place 14 mg onto the skin as needed.     . bisacodyl (BISACODYL) 5 MG EC tablet Take 1 tablet (5 mg total) by mouth once. At 8:00 AM, take 4 Dulcolax tablets (5 mg each). 4 tablet 0  . polyethylene glycol powder (GLYCOLAX/MIRALAX) powder Take 255 g by mouth once. At 2:00 PM, start Miralax Prep (255 gram bottle). 255 g 0   No current facility-administered medications for this visit.   Facility-Administered Medications Ordered in Other Visits  Medication Dose Route Frequency Provider Last Rate Last Dose  . pegfilgrastim (NEULASTA) injection 6 mg  6  mg Subcutaneous Once Lloyd Huger, MD   6 mg at 07/24/14 1511  . sodium chloride 0.9 % injection 10 mL  10 mL Intracatheter PRN Lloyd Huger, MD   10 mL at 07/08/14 1020  . sodium chloride 0.9 % injection 10 mL  10 mL Intracatheter PRN Lloyd Huger, MD   10 mL at 07/24/14 1507    OBJECTIVE: Filed Vitals:   10/07/14 1102  BP: 177/99  Pulse: 50  Temp: 96 F (35.6 C)  Resp: 20     Body mass index is 30.72 kg/(m^2).    ECOG FS:0 - Asymptomatic  General: Well-developed, well-nourished, no acute distress. Eyes: anicteric sclera. Lungs: Clear to  auscultation bilaterally. Heart: Regular rate and rhythm. No rubs, murmurs, or gallops. Abdomen: Soft, nontender, nondistended. No organomegaly noted, normoactive bowel sounds. Musculoskeletal: No edema, cyanosis, or clubbing. Neuro: Alert, answering all questions appropriately. Cranial nerves grossly intact. Skin: No rashes or petechiae noted. Psych: Normal affect.    LAB RESULTS:  Lab Results  Component Value Date   NA 141 10/07/2014   K 4.0 10/07/2014   CL 106 10/07/2014   CO2 29 10/07/2014   GLUCOSE 94 10/07/2014   BUN 19 10/07/2014   CREATININE 1.59* 10/07/2014   CALCIUM 9.7 10/07/2014   PROT 7.0 10/07/2014   ALBUMIN 4.0 10/07/2014   AST 27 10/07/2014   ALT 25 10/07/2014   ALKPHOS 80 10/07/2014   BILITOT 0.9 10/07/2014   GFRNONAA 45* 10/07/2014   GFRAA 52* 10/07/2014    Lab Results  Component Value Date   WBC 3.3* 10/07/2014   NEUTROABS 1.3* 10/07/2014   HGB 14.5 10/07/2014   HCT 44.1 10/07/2014   MCV 88.0 10/07/2014   PLT 110* 10/07/2014     STUDIES: Ct Abdomen Pelvis W Contrast  09/30/2014   CLINICAL DATA:  Restaging colon cancer, possible colostomy reversal. Chemotherapy complete.  EXAM: CT ABDOMEN AND PELVIS WITH CONTRAST  TECHNIQUE: Multidetector CT imaging of the abdomen and pelvis was performed using the standard protocol following bolus administration of intravenous contrast.  CONTRAST:  169mL OMNIPAQUE IOHEXOL 300 MG/ML  SOLN  COMPARISON:  01/30/2014.  FINDINGS: Lower chest: Lung bases show no acute findings. Heart size normal. No pericardial or pleural effusion. Tip of a central venous catheter terminates in the right atrium.  Hepatobiliary: Liver and gallbladder are unremarkable. No biliary ductal dilatation.  Pancreas: Negative.  Spleen: Negative.  Adrenals/Urinary Tract: Slight nodular thickening of the right adrenal gland measures 1.3 cm, stable. Left adrenal gland is unremarkable. Low-attenuation lesions in the kidneys measure up to 2.2 cm on the right.  The largest lesion has at least 1 thin internal septation with areas of the hazy increased density, measuring up to 37 Hounsfield units, as before. A peripherally calcified structure in the upper pole left kidney has associated scarring with it, stable. Ureters are decompressed. Bladder is low in volume, limiting evaluation.  Stomach/Bowel: Stomach and small bowel are unremarkable. Postoperative changes of left lower quadrant colostomy. Remainder of the descending and rectosigmoid colon are unremarkable.  Vascular/Lymphatic: Atherosclerotic calcification of the arterial vasculature. Infrarenal aorta measures up to a 4.0 cm. No pathologically enlarged lymph nodes.  Reproductive: Prostate is normal in size.  Other: No free fluid. Mesenteries and peritoneum are unremarkable. Tiny periumbilical hernia contains unobstructed small bowel.  Musculoskeletal: Intra medullary rod is seen in the proximal right femur. No worrisome lytic or sclerotic lesions. Degenerative changes are seen in the spine.  IMPRESSION: 1. Postoperative changes of a partial  left colon resection with a left lower quadrant colostomy. No evidence of recurrent or metastatic disease. 2. Heterogeneous lesion in the lower pole right kidney cannot be characterized as a simple cyst. Renal cell carcinoma cannot be excluded. Further evaluation with pre and post contrast MRI or CT should be considered. MRI is preferred in younger patients (due to lack of ionizing radiation) and for evaluating calcified lesion(s). These results will be called to the ordering clinician or representative by the Radiologist Assistant, and communication documented in the PACS or zVision Dashboard. 3. Infrarenal aortic aneurysm. Recommend followup by ultrasound in 1 year. This recommendation follows ACR consensus guidelines: White Paper of the ACR Incidental Findings Committee II on Vascular Findings. J Am Coll Radiol 2013; 10:789-794.   Electronically Signed   By: Lorin Picket M.D.    On: 09/30/2014 14:46    ASSESSMENT: Stage IIIB colon cancer, DVT.  PLAN:    1. Colon cancer: Patient completed 12 cycles of FOLFOX on July 29, 2014. CEA is within normal limits. CT scan results reviewed independently and reported as above with no evidence of disease. Return to clinic in 3 months with repeat laboratory work and further evaluation. He will require a repeat colonoscopy within 6-12 months of completing his treatments.  2. H/o DVT: Coumadin has been discontinued. 3. Elevated creatinine: Mild, monitor. 4. Neuropathy: Continue 300 mg gabapentin to take 3 times a day. 5. Renal lesion: Will get MRI in the next 1-2 weeks to further evaluate. Consider urology referral for lesion is suspicious.  Patient expressed understanding and was in agreement with this plan. He also understands that He can call clinic at any time with any questions, concerns, or complaints.   Colon cancer   Staging form: Neuroendocrine Tumor - Colon/Rectum, AJCC 7th Edition     Clinical stage from 05/04/2014: Stage IIIB (T4, N1, M0) - Signed by Lloyd Huger, MD on 05/04/2014   Lloyd Huger, MD   10/16/2014 1:20 PM

## 2014-10-19 ENCOUNTER — Ambulatory Visit
Admission: RE | Admit: 2014-10-19 | Discharge: 2014-10-19 | Disposition: A | Discharge status: Home / Self Care | Payer: Medicaid Other | Source: Ambulatory Visit | Attending: Oncology | Admitting: Oncology

## 2014-10-19 ENCOUNTER — Encounter
Admission: RE | Admit: 2014-10-19 | Discharge: 2014-10-19 | Disposition: A | Discharge status: Home / Self Care | Payer: Medicaid Other | Source: Ambulatory Visit | Attending: Surgery | Admitting: Surgery

## 2014-10-19 ENCOUNTER — Inpatient Hospital Stay: Payer: Medicaid Other

## 2014-10-19 ENCOUNTER — Ambulatory Visit
Admission: RE | Admit: 2014-10-19 | Discharge: 2014-10-19 | Disposition: A | Discharge status: Home / Self Care | Payer: Medicaid Other | Source: Ambulatory Visit | Attending: Surgery | Admitting: Surgery

## 2014-10-19 DIAGNOSIS — N2889 Other specified disorders of kidney and ureter: Secondary | ICD-10-CM

## 2014-10-19 DIAGNOSIS — Z01810 Encounter for preprocedural cardiovascular examination: Secondary | ICD-10-CM | POA: Diagnosis present

## 2014-10-19 DIAGNOSIS — Z01818 Encounter for other preprocedural examination: Secondary | ICD-10-CM

## 2014-10-19 DIAGNOSIS — Z85038 Personal history of other malignant neoplasm of large intestine: Secondary | ICD-10-CM | POA: Diagnosis not present

## 2014-10-19 DIAGNOSIS — I714 Abdominal aortic aneurysm, without rupture: Secondary | ICD-10-CM | POA: Insufficient documentation

## 2014-10-19 DIAGNOSIS — C801 Malignant (primary) neoplasm, unspecified: Secondary | ICD-10-CM

## 2014-10-19 DIAGNOSIS — M5134 Other intervertebral disc degeneration, thoracic region: Secondary | ICD-10-CM | POA: Insufficient documentation

## 2014-10-19 DIAGNOSIS — Z01812 Encounter for preprocedural laboratory examination: Secondary | ICD-10-CM | POA: Insufficient documentation

## 2014-10-19 HISTORY — DX: Localized edema: R60.0

## 2014-10-19 LAB — COMPREHENSIVE METABOLIC PANEL
ALT: 26 U/L (ref 17–63)
AST: 28 U/L (ref 15–41)
Albumin: 4.1 g/dL (ref 3.5–5.0)
Alkaline Phosphatase: 71 U/L (ref 38–126)
Anion gap: 5 (ref 5–15)
BUN: 23 mg/dL — AB (ref 6–20)
CHLORIDE: 110 mmol/L (ref 101–111)
CO2: 26 mmol/L (ref 22–32)
CREATININE: 1.54 mg/dL — AB (ref 0.61–1.24)
Calcium: 9.4 mg/dL (ref 8.9–10.3)
GFR calc Af Amer: 54 mL/min — ABNORMAL LOW (ref >60)
GFR, EST NON AFRICAN AMERICAN: 47 mL/min — AB (ref >60)
Glucose, Bld: 102 mg/dL — ABNORMAL HIGH (ref 65–99)
Potassium: 4 mmol/L (ref 3.5–5.1)
Sodium: 141 mmol/L (ref 135–145)
Total Bilirubin: 1.6 mg/dL — ABNORMAL HIGH (ref 0.3–1.2)
Total Protein: 7.1 g/dL (ref 6.5–8.1)

## 2014-10-19 LAB — CBC WITH DIFFERENTIAL/PLATELET
BASOS ABS: 0.1 10*3/uL (ref 0–0.1)
Basophils Relative: 1 %
EOS PCT: 16 %
Eosinophils Absolute: 0.6 10*3/uL (ref 0–0.7)
HEMATOCRIT: 47.2 % (ref 40.0–52.0)
Hemoglobin: 15.7 g/dL (ref 13.0–18.0)
LYMPHS PCT: 27 %
Lymphs Abs: 1.1 10*3/uL (ref 1.0–3.6)
MCH: 29 pg (ref 26.0–34.0)
MCHC: 33.3 g/dL (ref 32.0–36.0)
MCV: 87.2 fL (ref 80.0–100.0)
Monocytes Absolute: 0.4 10*3/uL (ref 0.2–1.0)
Monocytes Relative: 9 %
NEUTROS ABS: 1.8 10*3/uL (ref 1.4–6.5)
NEUTROS PCT: 47 %
PLATELETS: 132 10*3/uL — AB (ref 150–440)
RBC: 5.41 MIL/uL (ref 4.40–5.90)
RDW: 16.1 % — ABNORMAL HIGH (ref 11.5–14.5)
WBC: 4 10*3/uL (ref 3.8–10.6)

## 2014-10-19 LAB — SURGICAL PCR SCREEN
MRSA, PCR: NEGATIVE
Staphylococcus aureus: NEGATIVE

## 2014-10-19 MED ORDER — GADOBENATE DIMEGLUMINE 529 MG/ML IV SOLN
20.0000 mL | Freq: Once | INTRAVENOUS | Status: AC | PRN
  Administered 2014-10-19: 20 mL via INTRAVENOUS

## 2014-10-19 MED ORDER — HEPARIN SOD (PORK) LOCK FLUSH 100 UNIT/ML IV SOLN
500.0000 [IU] | Freq: Once | INTRAVENOUS | Status: AC
  Administered 2014-10-19: 500 [IU] via INTRAVENOUS
  Filled 2014-10-19: qty 5

## 2014-10-19 MED ORDER — SODIUM CHLORIDE 0.9 % IJ SOLN
10.0000 mL | INTRAMUSCULAR | Status: AC | PRN
Start: 2014-10-19 — End: ?
  Administered 2014-10-19: 10 mL
  Filled 2014-10-19: qty 10

## 2014-10-19 NOTE — Pre-Procedure Instructions (Signed)
Called Dr Kayleen Memos regarding abnormal EKG.  Dr Nehemiah Massed called and faxed with request for Clearance.  Has appointment Dr Nehemiah Massed 10/21/14 Wed at 14:00.  Dr Rexene Edison office called and faxed regarding clearance.

## 2014-10-19 NOTE — Patient Instructions (Signed)
  Your procedure is scheduled on: 10/28/14 Wed Report to Day Surgery.2nd floor medical mall To find out your arrival time please call [phone] between 1PM - 3PM on 10/27/14 Tues.  Remember: Instructions that are not followed completely may result in serious medical risk, up to and including death, or upon the discretion of your surgeon and anesthesiologist your surgery may need to be rescheduled.    _x___ 1. Do not eat food or drink liquids after midnight. No gum chewing or hard candies.     _x___ 2. No Alcohol for 24 hours before or after surgery.   ____ 3. Bring all medications with you on the day of surgery if instructed.    __x__ 4. Notify your doctor if there is any change in your medical condition     (cold, fever, infections).     Do not wear jewelry, make-up, hairpins, clips or nail polish.  Do not wear lotions, powders, or perfumes. You may wear deodorant.  Do not shave 48 hours prior to surgery. Men may shave face and neck.  Do not bring valuables to the hospital.    [facility] is not responsible for any belongings or valuables.               Contacts, dentures or bridgework may not be worn into surgery.  Leave your suitcase in the car. After surgery it may be brought to your room.  For patients admitted to the hospital, discharge time is determined by your                treatment team.   Patients discharged the day of surgery will not be allowed to drive home.   Please read over the following fact sheets that you were given:      ____ Take these medicines the morning of surgery with A SIP OF WATER:    1. gabapentin (NEURONTIN) 300 MG capsule  2. metoprolol tartrate (LOPRESSOR) 25 MG tablet  3.   4.  5.  6.  ____ Fleet Enema (as directed)   _x___ Use CHG Soap as directed  ____ Use inhalers on the day of surgery  ____ Stop metformin 2 days prior to surgery    ____ Take 1/2 of usual insulin dose the night before surgery and none on the morning of surgery.    ____ Stop Coumadin/Plavix/aspirin on   _x___ Stop Anti-inflammatories on 07/20/14 No aspirin, motrin, etc   ____ Stop supplements until after surgery.    ____ Bring C-Pap to the hospital.

## 2014-10-20 NOTE — Telephone Encounter (Signed)
error 

## 2014-10-21 DIAGNOSIS — I480 Paroxysmal atrial fibrillation: Secondary | ICD-10-CM | POA: Insufficient documentation

## 2014-10-21 DIAGNOSIS — I119 Hypertensive heart disease without heart failure: Secondary | ICD-10-CM | POA: Insufficient documentation

## 2014-10-21 DIAGNOSIS — R0681 Apnea, not elsewhere classified: Secondary | ICD-10-CM | POA: Insufficient documentation

## 2014-10-26 ENCOUNTER — Telehealth: Payer: Self-pay

## 2014-10-26 NOTE — Telephone Encounter (Signed)
Spoke with Santiago Glad in Pre-admission testing at this time. Explained that Cardiac Clearance has been obtained and it was placed in patient's chart under Media. We are cleared to proceed with Surgery.

## 2014-10-26 NOTE — Pre-Procedure Instructions (Signed)
Called Dr Rexene Edison office regarding clearance.  They are to call Dr Nehemiah Massed.

## 2014-10-26 NOTE — Telephone Encounter (Signed)
Called Dr. Alveria Apley office at this time to find out if Cardiac Clearance could be obtained or if Surgery would need to be cancelled for tomorrow.   Spoke with Mardene Celeste who inturn spoke with the nurse. Patient was seen at 0930 this am, has had all testing done last Friday, and Cardiac Clearance can be given for patient to have surgery. Per Mardene Celeste, Clearance is being sent over at this time.   Notified Santiago Glad in [employer] and will fax over as soon as I get notification from Dr. Nehemiah Massed via fax.

## 2014-10-28 ENCOUNTER — Inpatient Hospital Stay
Admission: RE | Admit: 2014-10-28 | Discharge: 2014-11-11 | DRG: 330 | Disposition: A | Discharge status: Home Health | Payer: Medicaid Other | Source: Ambulatory Visit | Attending: Surgery | Admitting: Surgery

## 2014-10-28 ENCOUNTER — Inpatient Hospital Stay: Payer: Medicaid Other | Admitting: Anesthesiology

## 2014-10-28 ENCOUNTER — Encounter: Payer: Self-pay | Admitting: Anesthesiology

## 2014-10-28 ENCOUNTER — Encounter
Admission: RE | Disposition: A | Discharge status: Home Health | Payer: Medicaid Other | Source: Ambulatory Visit | Attending: Surgery

## 2014-10-28 DIAGNOSIS — I2510 Atherosclerotic heart disease of native coronary artery without angina pectoris: Secondary | ICD-10-CM | POA: Diagnosis present

## 2014-10-28 DIAGNOSIS — I481 Persistent atrial fibrillation: Secondary | ICD-10-CM | POA: Diagnosis not present

## 2014-10-28 DIAGNOSIS — Z433 Encounter for attention to colostomy: Principal | ICD-10-CM

## 2014-10-28 DIAGNOSIS — R140 Abdominal distension (gaseous): Secondary | ICD-10-CM

## 2014-10-28 DIAGNOSIS — F1290 Cannabis use, unspecified, uncomplicated: Secondary | ICD-10-CM | POA: Diagnosis present

## 2014-10-28 DIAGNOSIS — G629 Polyneuropathy, unspecified: Secondary | ICD-10-CM | POA: Diagnosis present

## 2014-10-28 DIAGNOSIS — I4892 Unspecified atrial flutter: Secondary | ICD-10-CM | POA: Diagnosis not present

## 2014-10-28 DIAGNOSIS — Z79899 Other long term (current) drug therapy: Secondary | ICD-10-CM | POA: Diagnosis not present

## 2014-10-28 DIAGNOSIS — I129 Hypertensive chronic kidney disease with stage 1 through stage 4 chronic kidney disease, or unspecified chronic kidney disease: Secondary | ICD-10-CM | POA: Diagnosis present

## 2014-10-28 DIAGNOSIS — R1110 Vomiting, unspecified: Secondary | ICD-10-CM

## 2014-10-28 DIAGNOSIS — N189 Chronic kidney disease, unspecified: Secondary | ICD-10-CM | POA: Diagnosis present

## 2014-10-28 DIAGNOSIS — Y838 Other surgical procedures as the cause of abnormal reaction of the patient, or of later complication, without mention of misadventure at the time of the procedure: Secondary | ICD-10-CM | POA: Diagnosis not present

## 2014-10-28 DIAGNOSIS — I471 Supraventricular tachycardia: Secondary | ICD-10-CM | POA: Diagnosis not present

## 2014-10-28 DIAGNOSIS — I480 Paroxysmal atrial fibrillation: Secondary | ICD-10-CM | POA: Diagnosis not present

## 2014-10-28 DIAGNOSIS — E1122 Type 2 diabetes mellitus with diabetic chronic kidney disease: Secondary | ICD-10-CM | POA: Diagnosis present

## 2014-10-28 DIAGNOSIS — I429 Cardiomyopathy, unspecified: Secondary | ICD-10-CM | POA: Diagnosis present

## 2014-10-28 DIAGNOSIS — E876 Hypokalemia: Secondary | ICD-10-CM | POA: Diagnosis not present

## 2014-10-28 DIAGNOSIS — R0600 Dyspnea, unspecified: Secondary | ICD-10-CM | POA: Diagnosis not present

## 2014-10-28 DIAGNOSIS — I4891 Unspecified atrial fibrillation: Secondary | ICD-10-CM | POA: Diagnosis not present

## 2014-10-28 DIAGNOSIS — K9189 Other postprocedural complications and disorders of digestive system: Secondary | ICD-10-CM

## 2014-10-28 DIAGNOSIS — K219 Gastro-esophageal reflux disease without esophagitis: Secondary | ICD-10-CM | POA: Diagnosis present

## 2014-10-28 DIAGNOSIS — Z86718 Personal history of other venous thrombosis and embolism: Secondary | ICD-10-CM | POA: Diagnosis not present

## 2014-10-28 DIAGNOSIS — K660 Peritoneal adhesions (postprocedural) (postinfection): Secondary | ICD-10-CM | POA: Diagnosis present

## 2014-10-28 DIAGNOSIS — K567 Ileus, unspecified: Secondary | ICD-10-CM

## 2014-10-28 DIAGNOSIS — C185 Malignant neoplasm of splenic flexure: Secondary | ICD-10-CM

## 2014-10-28 DIAGNOSIS — Z933 Colostomy status: Secondary | ICD-10-CM | POA: Diagnosis not present

## 2014-10-28 DIAGNOSIS — F17210 Nicotine dependence, cigarettes, uncomplicated: Secondary | ICD-10-CM | POA: Diagnosis present

## 2014-10-28 DIAGNOSIS — K913 Postprocedural intestinal obstruction: Secondary | ICD-10-CM | POA: Diagnosis not present

## 2014-10-28 DIAGNOSIS — Z95828 Presence of other vascular implants and grafts: Secondary | ICD-10-CM

## 2014-10-28 DIAGNOSIS — I10 Essential (primary) hypertension: Secondary | ICD-10-CM

## 2014-10-28 HISTORY — PX: COLOSTOMY REVERSAL: SHX5782

## 2014-10-28 HISTORY — PX: LYSIS OF ADHESION: SHX5961

## 2014-10-28 HISTORY — DX: Tobacco use: Z72.0

## 2014-10-28 HISTORY — DX: Paroxysmal atrial fibrillation: I48.0

## 2014-10-28 SURGERY — COLOSTOMY REVERSAL
Anesthesia: General | Wound class: Contaminated

## 2014-10-28 MED ORDER — HYDROMORPHONE HCL 1 MG/ML IJ SOLN
0.2500 mg | INTRAMUSCULAR | Status: DC | PRN
  Administered 2014-10-28 (×4): 0.5 mg via INTRAVENOUS

## 2014-10-28 MED ORDER — ONDANSETRON 4 MG PO TBDP
4.0000 mg | ORAL_TABLET | Freq: Four times a day (QID) | ORAL | Status: DC | PRN
  Filled 2014-10-28: qty 1

## 2014-10-28 MED ORDER — CEFAZOLIN SODIUM-DEXTROSE 2-3 GM-% IV SOLR
INTRAVENOUS | Status: DC | PRN
  Administered 2014-10-28: 2 g via INTRAVENOUS

## 2014-10-28 MED ORDER — METOPROLOL TARTRATE 25 MG/10 ML ORAL SUSPENSION
25.0000 mg | Freq: Two times a day (BID) | ORAL | Status: DC
  Filled 2014-10-28: qty 10

## 2014-10-28 MED ORDER — NALOXONE HCL 0.4 MG/ML IJ SOLN: 0.4000 mg | INTRAMUSCULAR | Status: DC | PRN

## 2014-10-28 MED ORDER — HYDROMORPHONE HCL 1 MG/ML IJ SOLN
INTRAMUSCULAR | Status: AC
  Filled 2014-10-28: qty 1

## 2014-10-28 MED ORDER — ONDANSETRON HCL 4 MG/2ML IJ SOLN
4.0000 mg | Freq: Four times a day (QID) | INTRAMUSCULAR | Status: DC | PRN
Start: 2014-10-28 — End: 2014-11-11
  Administered 2014-10-30 – 2014-11-01 (×2): 4 mg via INTRAVENOUS
  Filled 2014-10-28 (×2): qty 2

## 2014-10-28 MED ORDER — MIDAZOLAM HCL 2 MG/2ML IJ SOLN
INTRAMUSCULAR | Status: DC | PRN
  Administered 2014-10-28: 1 mg via INTRAVENOUS

## 2014-10-28 MED ORDER — ENOXAPARIN SODIUM 40 MG/0.4ML ~~LOC~~ SOLN
40.0000 mg | SUBCUTANEOUS | Status: DC
  Administered 2014-10-28 – 2014-11-10 (×14): 40 mg via SUBCUTANEOUS
  Filled 2014-10-28 (×15): qty 0.4

## 2014-10-28 MED ORDER — ACETAMINOPHEN 325 MG PO TABS
650.0000 mg | ORAL_TABLET | Freq: Four times a day (QID) | ORAL | Status: DC | PRN
  Administered 2014-10-29 – 2014-10-30 (×3): 650 mg via ORAL
  Filled 2014-10-28 (×3): qty 2

## 2014-10-28 MED ORDER — DEXAMETHASONE SODIUM PHOSPHATE 4 MG/ML IJ SOLN
INTRAMUSCULAR | Status: DC | PRN
  Administered 2014-10-28: 10 mg via INTRAVENOUS

## 2014-10-28 MED ORDER — PHENYLEPHRINE HCL 10 MG/ML IJ SOLN
INTRAMUSCULAR | Status: DC | PRN
  Administered 2014-10-28: 100 ug via INTRAVENOUS

## 2014-10-28 MED ORDER — LACTATED RINGERS IV SOLN
INTRAVENOUS | Status: DC
  Administered 2014-10-28 (×3): via INTRAVENOUS

## 2014-10-28 MED ORDER — NEOSTIGMINE METHYLSULFATE 10 MG/10ML IV SOLN
INTRAVENOUS | Status: DC | PRN
  Administered 2014-10-28: 4 mg via INTRAVENOUS

## 2014-10-28 MED ORDER — SODIUM CHLORIDE 0.9 % IJ SOLN: 9.0000 mL | INTRAMUSCULAR | Status: DC | PRN

## 2014-10-28 MED ORDER — LIDOCAINE HCL (PF) 4 % IJ SOLN
INTRAMUSCULAR | Status: DC | PRN
  Administered 2014-10-28: 5 mL via RESPIRATORY_TRACT

## 2014-10-28 MED ORDER — ACETAMINOPHEN 10 MG/ML IV SOLN
INTRAVENOUS | Status: DC | PRN
  Administered 2014-10-28: 1000 mg via INTRAVENOUS

## 2014-10-28 MED ORDER — KETAMINE HCL 50 MG/ML IJ SOLN
INTRAMUSCULAR | Status: DC | PRN
  Administered 2014-10-28: 50 mg via INTRAMUSCULAR

## 2014-10-28 MED ORDER — HYDROMORPHONE HCL 1 MG/ML IJ SOLN
INTRAMUSCULAR | Status: DC | PRN
  Administered 2014-10-28 (×2): 1 mg via INTRAVENOUS

## 2014-10-28 MED ORDER — ONDANSETRON HCL 4 MG/2ML IJ SOLN: 4.0000 mg | Freq: Once | INTRAMUSCULAR | Status: DC | PRN

## 2014-10-28 MED ORDER — DIPHENHYDRAMINE HCL 12.5 MG/5ML PO ELIX: 12.5000 mg | ORAL_SOLUTION | Freq: Four times a day (QID) | ORAL | Status: DC | PRN

## 2014-10-28 MED ORDER — PANTOPRAZOLE SODIUM 40 MG IV SOLR
40.0000 mg | Freq: Every day | INTRAVENOUS | Status: DC
  Administered 2014-10-28 – 2014-11-03 (×7): 40 mg via INTRAVENOUS
  Filled 2014-10-28 (×7): qty 40

## 2014-10-28 MED ORDER — FAMOTIDINE 20 MG PO TABS
ORAL_TABLET | ORAL | Status: AC
  Administered 2014-10-28: 20 mg via ORAL
  Filled 2014-10-28: qty 1

## 2014-10-28 MED ORDER — EPHEDRINE SULFATE 50 MG/ML IJ SOLN
INTRAMUSCULAR | Status: DC | PRN
  Administered 2014-10-28: 10 mg via INTRAVENOUS

## 2014-10-28 MED ORDER — NITROGLYCERIN 2 % TD OINT
TOPICAL_OINTMENT | TRANSDERMAL | Status: DC | PRN
  Administered 2014-10-28: 1 [in_us] via TOPICAL

## 2014-10-28 MED ORDER — ROCURONIUM BROMIDE 100 MG/10ML IV SOLN
INTRAVENOUS | Status: DC | PRN
  Administered 2014-10-28 (×2): 10 mg via INTRAVENOUS
  Administered 2014-10-28: 40 mg via INTRAVENOUS
  Administered 2014-10-28 (×3): 10 mg via INTRAVENOUS
  Administered 2014-10-28: 15 mg via INTRAVENOUS
  Administered 2014-10-28: 10 mg via INTRAVENOUS
  Administered 2014-10-28: 15 mg via INTRAVENOUS
  Administered 2014-10-28: 10 mg via INTRAVENOUS

## 2014-10-28 MED ORDER — FENTANYL CITRATE (PF) 100 MCG/2ML IJ SOLN
25.0000 ug | INTRAMUSCULAR | Status: DC | PRN
  Administered 2014-10-28 (×4): 25 ug via INTRAVENOUS

## 2014-10-28 MED ORDER — DIPHENHYDRAMINE HCL 50 MG/ML IJ SOLN: 12.5000 mg | Freq: Four times a day (QID) | INTRAMUSCULAR | Status: DC | PRN

## 2014-10-28 MED ORDER — METOPROLOL TARTRATE 25 MG PO TABS
25.0000 mg | ORAL_TABLET | Freq: Two times a day (BID) | ORAL | Status: DC
  Administered 2014-10-28 – 2014-11-01 (×8): 25 mg via ORAL
  Filled 2014-10-28 (×8): qty 1

## 2014-10-28 MED ORDER — FENTANYL CITRATE (PF) 100 MCG/2ML IJ SOLN
INTRAMUSCULAR | Status: AC
  Filled 2014-10-28: qty 2

## 2014-10-28 MED ORDER — ONDANSETRON HCL 4 MG/2ML IJ SOLN: 4.0000 mg | Freq: Four times a day (QID) | INTRAMUSCULAR | Status: DC | PRN

## 2014-10-28 MED ORDER — EPINEPHRINE HCL 0.1 MG/ML IJ SOSY
PREFILLED_SYRINGE | INTRAMUSCULAR | Status: DC | PRN
  Administered 2014-10-28: 10 ug via INTRAVENOUS

## 2014-10-28 MED ORDER — GLYCOPYRROLATE 0.2 MG/ML IJ SOLN
INTRAMUSCULAR | Status: DC | PRN
  Administered 2014-10-28: 0.3 mg via INTRAVENOUS
  Administered 2014-10-28: .6 mg via INTRAVENOUS

## 2014-10-28 MED ORDER — SODIUM CHLORIDE 0.9 % IV SOLN
INTRAVENOUS | Status: DC
  Administered 2014-10-28 – 2014-10-29 (×2): via INTRAVENOUS

## 2014-10-28 MED ORDER — DEXTROSE 5 % IV SOLN
2.0000 g | INTRAVENOUS | Status: AC
  Administered 2014-10-28: 2 g via INTRAVENOUS
  Filled 2014-10-28: qty 2

## 2014-10-28 MED ORDER — ACETAMINOPHEN 10 MG/ML IV SOLN
INTRAVENOUS | Status: AC
  Filled 2014-10-28: qty 100

## 2014-10-28 MED ORDER — PROPOFOL 10 MG/ML IV BOLUS
INTRAVENOUS | Status: DC | PRN
  Administered 2014-10-28: 140 mg via INTRAVENOUS

## 2014-10-28 MED ORDER — ONDANSETRON HCL 4 MG/2ML IJ SOLN
INTRAMUSCULAR | Status: DC | PRN
  Administered 2014-10-28: 4 mg via INTRAVENOUS

## 2014-10-28 MED ORDER — FAMOTIDINE 20 MG PO TABS
20.0000 mg | ORAL_TABLET | Freq: Once | ORAL | Status: AC
  Administered 2014-10-28: 20 mg via ORAL

## 2014-10-28 MED ORDER — FENTANYL CITRATE (PF) 100 MCG/2ML IJ SOLN
INTRAMUSCULAR | Status: DC | PRN
  Administered 2014-10-28 (×5): 50 ug via INTRAVENOUS
  Administered 2014-10-28: 150 ug via INTRAVENOUS
  Administered 2014-10-28: 100 ug via INTRAVENOUS

## 2014-10-28 MED ORDER — ACETAMINOPHEN 650 MG RE SUPP: 650.0000 mg | Freq: Four times a day (QID) | RECTAL | Status: DC | PRN

## 2014-10-28 MED ORDER — ONDANSETRON HCL 4 MG/2ML IJ SOLN
4.0000 mg | Freq: Four times a day (QID) | INTRAMUSCULAR | Status: DC | PRN
  Administered 2014-10-30 – 2014-11-04 (×3): 4 mg via INTRAVENOUS
  Filled 2014-10-28 (×3): qty 2

## 2014-10-28 MED ORDER — HYDROMORPHONE 0.3 MG/ML IV SOLN
INTRAVENOUS | Status: DC
  Administered 2014-10-28: 0.799 mg via INTRAVENOUS
  Administered 2014-10-28: 17:00:00 via INTRAVENOUS
  Administered 2014-10-29: 1.99 mg via INTRAVENOUS
  Administered 2014-10-29: 0.199 mg via INTRAVENOUS
  Administered 2014-10-29: 0.4 mg via INTRAVENOUS
  Administered 2014-10-30: 25 mg via INTRAVENOUS
  Administered 2014-10-30 (×2): 0.4 mg via INTRAVENOUS
  Administered 2014-10-30: 0 mg via INTRAVENOUS
  Administered 2014-10-31: 0.6 mg via INTRAVENOUS
  Filled 2014-10-28 (×2): qty 25

## 2014-10-28 MED ORDER — LIDOCAINE HCL (CARDIAC) 20 MG/ML IV SOLN
INTRAVENOUS | Status: DC | PRN
  Administered 2014-10-28: 100 mg via INTRAVENOUS

## 2014-10-28 SURGICAL SUPPLY — 46 items
ATOMIZER TRACHEAL (MISCELLANEOUS) ×3 IMPLANT
CANISTER SUCT 1200ML W/VALVE (MISCELLANEOUS) ×3 IMPLANT
CATH TRAY 16F METER LATEX (MISCELLANEOUS) ×3 IMPLANT
CHLORAPREP W/TINT 26ML (MISCELLANEOUS) ×3 IMPLANT
DRAIN PENROSE 1/4X12 LTX (DRAIN) ×3 IMPLANT
DRAPE INCISE IOBAN 66X45 STRL (DRAPES) IMPLANT
DRAPE LAPAROTOMY 100X77 ABD (DRAPES) ×3 IMPLANT
DRAPE LEGGINS SURG 28X43 STRL (DRAPES) IMPLANT
DRAPE UNDER BUTTOCK W/FLU (DRAPES) IMPLANT
GAUZE SPONGE 4X4 12PLY STRL (GAUZE/BANDAGES/DRESSINGS) ×6 IMPLANT
GLOVE BIO SURGEON STRL SZ7.5 (GLOVE) ×30 IMPLANT
GLOVE INDICATOR 8.0 STRL GRN (GLOVE) ×24 IMPLANT
GOWN STRL REUS W/ TWL LRG LVL3 (GOWN DISPOSABLE) ×16 IMPLANT
GOWN STRL REUS W/TWL LRG LVL3 (GOWN DISPOSABLE) ×8
KIT RM TURNOVER STRD PROC AR (KITS) ×3 IMPLANT
LABEL OR SOLS (LABEL) ×3 IMPLANT
NS IRRIG 1000ML POUR BTL (IV SOLUTION) ×3 IMPLANT
PACK BASIN MAJOR ARMC (MISCELLANEOUS) ×3 IMPLANT
PACK COLON CLEAN CLOSURE (MISCELLANEOUS) ×3 IMPLANT
PAD ABD DERMACEA PRESS 5X9 (GAUZE/BANDAGES/DRESSINGS) ×6 IMPLANT
PAD GROUND ADULT SPLIT (MISCELLANEOUS) ×3 IMPLANT
PAD PREP 24X41 OB/GYN DISP (PERSONAL CARE ITEMS) ×3 IMPLANT
RELOAD PROXIMATE 75MM BLUE (ENDOMECHANICALS) ×3 IMPLANT
SHEARS HARMONIC STRL 23CM (MISCELLANEOUS) IMPLANT
SOL PREP PVP 2OZ (MISCELLANEOUS) ×3
SOLUTION PREP PVP 2OZ (MISCELLANEOUS) ×2 IMPLANT
SPONGE LAP 18X18 5 PK (GAUZE/BANDAGES/DRESSINGS) ×9 IMPLANT
STAPLER PROXIMATE 75MM BLUE (STAPLE) ×3 IMPLANT
STAPLER SKIN PROX 35W (STAPLE) ×3 IMPLANT
SURGILUBE 2OZ TUBE FLIPTOP (MISCELLANEOUS) IMPLANT
SUT ETHILON 2 0 FS 18 (SUTURE) ×3 IMPLANT
SUT MAXON ABS #0 GS21 30IN (SUTURE) ×3 IMPLANT
SUT NYLON 2-0 (SUTURE) IMPLANT
SUT PDS 3-0 (SUTURE) ×3
SUT PDS AB 1 TP1 96 (SUTURE) IMPLANT
SUT PDS PLUS AB 3-0 PC-5 (SUTURE) ×6 IMPLANT
SUT PROLENE 0 CT 1 30 (SUTURE) ×3 IMPLANT
SUT SILK 3-0 (SUTURE) ×16 IMPLANT
SUT SILK 3-0 SH-1 18XCR BRD (SUTURE) ×2
SUT VIC AB 3-0 SH 27 (SUTURE) ×1
SUT VIC AB 3-0 SH 27X BRD (SUTURE) ×2 IMPLANT
SUT VICRYL 0 TIES 12 18 (SUTURE) ×3 IMPLANT
SUTURE SILK 3-0 SH-1 18XCR BRD (SUTURE) ×2 IMPLANT
SYR BULB IRRIG 60ML STRL (SYRINGE) ×3 IMPLANT
TUBING CONNECTING 10 (TUBING) ×3 IMPLANT
WATER STERILE IRR 1000ML POUR (IV SOLUTION) IMPLANT

## 2014-10-28 NOTE — Progress Notes (Signed)
Patient seen and evaluated.  No changes to H and P.  MRI of renal lesion noted, would not recommend any intraop eval due to the fact that case is dirty and would be at risk of retroperitoneal abscess.  Will proceed with surgery.

## 2014-10-28 NOTE — Brief Op Note (Signed)
10/28/2014  2:41 PM  PATIENT:  James Keller  64 y.o. male  PRE-OPERATIVE DIAGNOSIS:  Colon cancer with colostomy  POST-OPERATIVE DIAGNOSIS:  Colon cancer with colostomy  PROCEDURE:  Procedure(s): COLOSTOMY REVERSAL (N/A) LYSIS OF ADHESION  SURGEON:  Surgeon(s) and Role:    * Marlyce Huge, MD - Primary    * Nestor Lewandowsky, MD - Assisting  PHYSICIAN ASSISTANT:   ASSISTANTS: Marta Lamas, MD  ANESTHESIA:   general  EBL:  Total I/O In: 2350 [I.V.:2350] Out: 550 [Urine:350; Blood:200]  BLOOD ADMINISTERED:none  DRAINS: Penrose drain in the Subcutaneous tissue   LOCAL MEDICATIONS USED:  NONE  SPECIMEN:  Excision  DISPOSITION OF SPECIMEN:  PATHOLOGY  COUNTS:  YES  TOURNIQUET:  * No tourniquets in log *  DICTATION: .Note written in EPIC  PLAN OF CARE: Admit to inpatient   PATIENT DISPOSITION:  PACU - hemodynamically stable.   Delay start of Pharmacological VTE agent (>24hrs) due to surgical blood loss or risk of bleeding: yes

## 2014-10-28 NOTE — Anesthesia Preprocedure Evaluation (Addendum)
Anesthesia Evaluation  Patient identified by MRN, date of birth, ID band Patient awake    Reviewed: Allergy & Precautions, NPO status , Patient's Chart, lab work & pertinent test results, reviewed documented beta blocker date and time   Airway Mallampati: II  TM Distance: >3 FB     Dental  (+) Chipped   Pulmonary Current Smoker,           Cardiovascular hypertension, Pt. on medications and Pt. on home beta blockers      Neuro/Psych  Neuromuscular disease    GI/Hepatic   Endo/Other    Renal/GU      Musculoskeletal   Abdominal   Peds  Hematology   Anesthesia Other Findings ekg does not show Afib this am. Carddiac - stress ultrasound ok.  Reproductive/Obstetrics                           Anesthesia Physical Anesthesia Plan  ASA: III  Anesthesia Plan: General   Post-op Pain Management:    Induction: Intravenous  Airway Management Planned: Oral ETT  Additional Equipment:   Intra-op Plan:   Post-operative Plan:   Informed Consent: I have reviewed the patients History and Physical, chart, labs and discussed the procedure including the risks, benefits and alternatives for the proposed anesthesia with the patient or authorized representative who has indicated his/her understanding and acceptance.     Plan Discussed with: CRNA  Anesthesia Plan Comments:         Anesthesia Quick Evaluation

## 2014-10-28 NOTE — Anesthesia Procedure Notes (Signed)
Procedure Name: Intubation Date/Time: 10/28/2014 8:49 AM Performed by: Rosaria Ferries, Creg Gilmer Pre-anesthesia Checklist: Patient identified, Emergency Drugs available, Suction available and Patient being monitored Patient Re-evaluated:Patient Re-evaluated prior to inductionOxygen Delivery Method: Circle system utilized Preoxygenation: Pre-oxygenation with 100% oxygen Intubation Type: IV induction Laryngoscope Size: Mac and 3 Tube type: Oral Tube size: 7.0 mm Number of attempts: 1 Secured at: 22 cm Tube secured with: Tape

## 2014-10-28 NOTE — Transfer of Care (Signed)
Immediate Anesthesia Transfer of Care Note  Patient: James Keller  Procedure(s) Performed: Procedure(s): COLOSTOMY REVERSAL (N/A) LYSIS OF ADHESION  Patient Location: PACU  Anesthesia Type:General  Level of Consciousness: awake, alert , oriented and patient cooperative  Airway & Oxygen Therapy: Patient Spontanous Breathing and Patient connected to face mask oxygen  Post-op Assessment: Report given to RN  Post vital signs: Reviewed and stable  Last Vitals:  Filed Vitals:   10/28/14 1426  BP: 153/93  Pulse: 70  Temp: 37.8 C  Resp: 13    Complications: No apparent anesthesia complications

## 2014-10-29 LAB — COMPREHENSIVE METABOLIC PANEL
ALK PHOS: 53 U/L (ref 38–126)
ALT: 24 U/L (ref 17–63)
AST: 20 U/L (ref 15–41)
Albumin: 3.5 g/dL (ref 3.5–5.0)
Anion gap: 7 (ref 5–15)
BUN: 16 mg/dL (ref 6–20)
CALCIUM: 8.8 mg/dL — AB (ref 8.9–10.3)
CHLORIDE: 109 mmol/L (ref 101–111)
CO2: 24 mmol/L (ref 22–32)
CREATININE: 1.5 mg/dL — AB (ref 0.61–1.24)
GFR calc non Af Amer: 48 mL/min — ABNORMAL LOW (ref >60)
GFR, EST AFRICAN AMERICAN: 55 mL/min — AB (ref >60)
Glucose, Bld: 109 mg/dL — ABNORMAL HIGH (ref 65–99)
Potassium: 4.2 mmol/L (ref 3.5–5.1)
SODIUM: 140 mmol/L (ref 135–145)
Total Bilirubin: 1.9 mg/dL — ABNORMAL HIGH (ref 0.3–1.2)
Total Protein: 6.3 g/dL — ABNORMAL LOW (ref 6.5–8.1)

## 2014-10-29 LAB — CBC
HCT: 48.3 % (ref 40.0–52.0)
Hemoglobin: 16 g/dL (ref 13.0–18.0)
MCH: 28.4 pg (ref 26.0–34.0)
MCHC: 33.2 g/dL (ref 32.0–36.0)
MCV: 85.5 fL (ref 80.0–100.0)
PLATELETS: 123 10*3/uL — AB (ref 150–440)
RBC: 5.65 MIL/uL (ref 4.40–5.90)
RDW: 15.7 % — AB (ref 11.5–14.5)
WBC: 9.5 10*3/uL (ref 3.8–10.6)

## 2014-10-29 MED ORDER — DEXTROSE-NACL 5-0.45 % IV SOLN
INTRAVENOUS | Status: DC
  Administered 2014-10-29 – 2014-10-30 (×4): via INTRAVENOUS
  Administered 2014-10-31: 100 mL/h via INTRAVENOUS
  Administered 2014-11-01 – 2014-11-03 (×4): via INTRAVENOUS

## 2014-10-29 MED ORDER — GABAPENTIN 300 MG PO CAPS
300.0000 mg | ORAL_CAPSULE | Freq: Three times a day (TID) | ORAL | Status: DC
  Administered 2014-10-29 – 2014-11-11 (×40): 300 mg via ORAL
  Filled 2014-10-29 (×40): qty 1

## 2014-10-29 NOTE — Op Note (Signed)
Preoperative Diagnosis: History of colon resection for obstructing cancer Postoperative diagnosis: Same, with significant adhesions Procedure performed: Exploratory laparotomy, extensive lysis of adhesions, closure of colostomy Surgeon: Virgie Kunda Assistant: Oaks Anesthesia: GETA Specimen: Colostomy EBL: 269ml  Indication for Surgery: James Keller is a pleasant 64 yo M who presents for elective colostomy closure  Details of Surgery:  Informed consent was obtained.  Patient was brought to the OR suite and laid supine on the OR table.  He was induced, ETT was placed, general anesthesia was administered.  Abdomen was prepped and draped.  Time out performed correctly identifying patient name, operative site and procedure to be performed.  Incision was made through prior incision.  It was carefully dissected down to the fascia, the fascia was carefully incised and the peritoneum was entered.  Small intestine and adhesions were immediately encountered.  I was unable to discern the distal end of the resected colon and extensive adhesiolysis > 3h was performed.  Small areas of serosal damage were observed in 2 places which were imbricated with 3-0 silk interrupted lembers.  When bowel was freed, I took down the ostomy and resected the opened end.  I then found the two ends of resected colon and found that they could reach without tension.  A side to side anastamosis was handsewn using two outer 3-0 silk lemberts and an inner using 2x 3-0 PDS.  The anastamosis appeared widely patent.  The abdomen was irrigated, the bowel was ran.  The ostomy site fascia was then closed with a running 0 prolene and the midline incision with 2 looped #1 PDS ran from the ends and tied in the middle.  The skin was then closed with staples and a sterile dressing was placed on the wound.  Penrose wicks were placed through the ostomy staple line to allow drainage.  The patient was then awoken, extubated and brought to PACU.  There were  no immediate complications.  The needle, sponge and instrument counts were correct at the end of the procedure.

## 2014-10-29 NOTE — Anesthesia Postprocedure Evaluation (Signed)
  Anesthesia Post-op Note  Patient: James Keller  Procedure(s) Performed: Procedure(s): COLOSTOMY REVERSAL (N/A) LYSIS OF ADHESION  Anesthesia type:General  Patient location: PACU  Post pain: Pain level controlled  Post assessment: Post-op Vital signs reviewed, Patient's Cardiovascular Status Stable, Respiratory Function Stable, Patent Airway and No signs of Nausea or vomiting  Post vital signs: Reviewed and stable  Last Vitals:  Filed Vitals:   10/29/14 1351  BP: 161/74  Pulse: 44  Temp: 36.7 C  Resp: 16    Level of consciousness: awake, alert  and patient cooperative  Complications: No apparent anesthesia complications

## 2014-10-29 NOTE — Evaluation (Signed)
Physical Therapy Evaluation Patient Details Name: James Keller MRN: [id number] DOB: 1950/05/31 Today's Date: 10/29/2014   History of Present Illness  Patient is a 64 y/o male with history of obstructing colon cancer. Admitted to [facility] for ex-lap, closure of colostomy, and extensive lysis of adhesions.   Clinical Impression  Patient initially using accessory musculature significantly during session and encouraged heavily with manual cuing for diaphragmatic breathing which improved his respiratory effort and levels. Patient requires assistance for bed mobility, but is able to complete OOB mobility with RW and no physical assistance. Patient displays decreased gait speed throughout ambulation secondary to pain and weakness, however he requires little to no rest breaks for ambulation. No loss of balance noted. Patient would benefit from continued skilled PT services to address the above mobility deficits.     Follow Up Recommendations Home health PT (supervision for mobility)     Equipment Recommendations       Recommendations for Other Services Other (comment) (Respiratory for training of using abdominal musculature)     Precautions / Restrictions Precautions Precautions: Fall Restrictions Weight Bearing Restrictions: No      Mobility  Bed Mobility Overal bed mobility: Needs Assistance Bed Mobility: Supine to Sit     Supine to sit: Mod assist     General bed mobility comments: Patient very slow initially with abdominal pain, and requires assistance for bringing torso upright.   Transfers Overall transfer level: Needs assistance Equipment used: Rolling walker (2 wheeled) Transfers: Sit to/from Stand Sit to Stand: Min guard         General transfer comment: Patient requires no physical assistance, just cuing for hand positioning, no loss of balance.   Ambulation/Gait Ambulation/Gait assistance: Min guard Ambulation Distance (Feet): 250 Feet Assistive device: Rolling  walker (2 wheeled) Gait Pattern/deviations: WFL(Within Functional Limits);Decreased step length - right;Decreased step length - left   Gait velocity interpretation: <1.8 ft/sec, indicative of risk for recurrent falls General Gait Details: Patient ambulates slowly with reciprocal step through gait pattern and no loss of balance.   Stairs            Wheelchair Mobility    Modified Rankin (Stroke Patients Only)       Balance Overall balance assessment: Needs assistance   Sitting balance-Leahy Scale: Good     Standing balance support: Bilateral upper extremity supported Standing balance-Leahy Scale: Fair Standing balance comment: No loss of balance noted during ambulation.                              Pertinent Vitals/Pain Pain Assessment: Faces Faces Pain Scale: Hurts even more Pain Location: Abdomen  Pain Descriptors / Indicators: Aching Pain Intervention(s): Limited activity within patient's tolerance;Monitored during session;Premedicated before session;Repositioned;Relaxation    Home Living Family/patient expects to be discharged to:: Private residence Living Arrangements: Parent Available Help at Discharge: Family Type of Home: House Home Access: Stairs to enter Entrance Stairs-Rails: Can reach both Entrance Stairs-Number of Steps: 3   Home Equipment: Environmental consultant - 2 wheels;Cane - single point      Prior Function Level of Independence: Independent         Comments: Patient was ambulating community distances without AD.      Hand Dominance        Extremity/Trunk Assessment   Upper Extremity Assessment: Overall WFL for tasks assessed           Lower Extremity Assessment: Overall WFL for tasks assessed  Communication   Communication: No difficulties (Very quiet with speech)  Cognition Arousal/Alertness: Awake/alert;Lethargic Behavior During Therapy: WFL for tasks assessed/performed;Flat affect Overall Cognitive Status:  Within Functional Limits for tasks assessed                      General Comments      Exercises Other Exercises Other Exercises: Worked on breathing through diaphragm x 5 minutes as patient was heavily using accessory musculature.       Assessment/Plan    PT Assessment Patient needs continued PT services  PT Diagnosis Difficulty walking;Generalized weakness   PT Problem List Decreased strength;Decreased activity tolerance;Decreased balance;Decreased knowledge of use of DME;Pain;Decreased safety awareness  PT Treatment Interventions DME instruction;Therapeutic activities;Therapeutic exercise;Gait training;Balance training   PT Goals (Current goals can be found in the Care Plan section) Acute Rehab PT Goals Patient Stated Goal: To return home safely  PT Goal Formulation: With patient/family Time For Goal Achievement: 11/12/14 Potential to Achieve Goals: Good    Frequency Min 2X/week   Barriers to discharge Decreased caregiver support Patient will need assistance with mobility.     Co-evaluation               End of Session Equipment Utilized During Treatment: Gait belt Activity Tolerance: Patient tolerated treatment well;Patient limited by fatigue Patient left: in chair;with call bell/phone within reach;with family/visitor present;with chair alarm set Nurse Communication: Mobility status         Time: 1219-7588 PT Time Calculation (min) (ACUTE ONLY): 36 min   Charges:   PT Evaluation $Initial PT Evaluation Tier I: 1 Procedure     PT G Codes:       Kerman Passey, PT, DPT    10/29/2014, 12:50 PM

## 2014-10-29 NOTE — Progress Notes (Signed)
Surgery progress Note  S: C/o discomfort, no acute issues O:Blood pressure 153/95, pulse 91, temperature 97.7 F (36.5 C), temperature source Oral, resp. rate 25, height 6\' 1"  (1.854 m), weight 234 lb (106.142 kg), SpO2 94 %., UOP good GEN: NAD/A&Ox3 ABD: soft, approp tender, nondistended  Labs WBC 9.5  A/P 64 yo s/p colostomy closure, extensive lysis of adhesions - npo - maintenance IVF - OOB to chair/PT

## 2014-10-30 ENCOUNTER — Inpatient Hospital Stay: Payer: Medicaid Other

## 2014-10-30 DIAGNOSIS — I471 Supraventricular tachycardia: Secondary | ICD-10-CM

## 2014-10-30 DIAGNOSIS — I481 Persistent atrial fibrillation: Secondary | ICD-10-CM

## 2014-10-30 LAB — COMPREHENSIVE METABOLIC PANEL
ALT: 20 U/L (ref 17–63)
AST: 20 U/L (ref 15–41)
Albumin: 3.4 g/dL — ABNORMAL LOW (ref 3.5–5.0)
Alkaline Phosphatase: 76 U/L (ref 38–126)
Anion gap: 9 (ref 5–15)
BUN: 11 mg/dL (ref 6–20)
CHLORIDE: 104 mmol/L (ref 101–111)
CO2: 25 mmol/L (ref 22–32)
CREATININE: 1.18 mg/dL (ref 0.61–1.24)
Calcium: 9.3 mg/dL (ref 8.9–10.3)
Glucose, Bld: 122 mg/dL — ABNORMAL HIGH (ref 65–99)
Potassium: 3.6 mmol/L (ref 3.5–5.1)
Sodium: 138 mmol/L (ref 135–145)
TOTAL PROTEIN: 7 g/dL (ref 6.5–8.1)
Total Bilirubin: 4.5 mg/dL — ABNORMAL HIGH (ref 0.3–1.2)

## 2014-10-30 LAB — CBC
HCT: 48.5 % (ref 40.0–52.0)
Hemoglobin: 16.6 g/dL (ref 13.0–18.0)
MCH: 29.2 pg (ref 26.0–34.0)
MCHC: 34.3 g/dL (ref 32.0–36.0)
MCV: 85.2 fL (ref 80.0–100.0)
PLATELETS: 120 10*3/uL — AB (ref 150–440)
RBC: 5.69 MIL/uL (ref 4.40–5.90)
RDW: 16 % — AB (ref 11.5–14.5)
WBC: 7.7 10*3/uL (ref 3.8–10.6)

## 2014-10-30 LAB — BASIC METABOLIC PANEL
Anion gap: 9 (ref 5–15)
BUN: 10 mg/dL (ref 6–20)
CALCIUM: 9.3 mg/dL (ref 8.9–10.3)
CO2: 26 mmol/L (ref 22–32)
CREATININE: 1.17 mg/dL (ref 0.61–1.24)
Chloride: 103 mmol/L (ref 101–111)
GFR calc non Af Amer: >60 mL/min (ref >60)
GLUCOSE: 136 mg/dL — AB (ref 65–99)
Potassium: 3.6 mmol/L (ref 3.5–5.1)
Sodium: 138 mmol/L (ref 135–145)

## 2014-10-30 LAB — TROPONIN I: Troponin I: <0.03 ng/mL (ref <0.031)

## 2014-10-30 MED ORDER — DILTIAZEM LOAD VIA INFUSION
15.0000 mg | Freq: Once | INTRAVENOUS | Status: DC
  Filled 2014-10-30: qty 15

## 2014-10-30 MED ORDER — DILTIAZEM LOAD VIA INFUSION
10.0000 mg | Freq: Once | INTRAVENOUS | Status: AC
  Administered 2014-10-30: 10 mg via INTRAVENOUS
  Filled 2014-10-30: qty 10

## 2014-10-30 MED ORDER — INFLUENZA VAC SPLIT QUAD 0.5 ML IM SUSY
0.5000 mL | PREFILLED_SYRINGE | INTRAMUSCULAR | Status: AC
  Administered 2014-10-31: 0.5 mL via INTRAMUSCULAR
  Filled 2014-10-30: qty 0.5

## 2014-10-30 MED ORDER — ENALAPRILAT 1.25 MG/ML IV SOLN
0.6250 mg | Freq: Four times a day (QID) | INTRAVENOUS | Status: DC | PRN
  Administered 2014-10-30 – 2014-10-31 (×2): 0.625 mg via INTRAVENOUS
  Filled 2014-10-30 (×3): qty 0.5

## 2014-10-30 MED ORDER — DILTIAZEM HCL 100 MG IV SOLR
5.0000 mg/h | INTRAVENOUS | Status: DC
  Administered 2014-10-30 – 2014-11-01 (×5): 10 mg/h via INTRAVENOUS
  Administered 2014-11-01: 15 mg/h via INTRAVENOUS
  Administered 2014-11-02: 5 mg/h via INTRAVENOUS
  Filled 2014-10-30 (×8): qty 100

## 2014-10-30 MED ORDER — HYDRALAZINE HCL 20 MG/ML IJ SOLN
10.0000 mg | INTRAMUSCULAR | Status: DC | PRN
  Administered 2014-10-30 (×2): 10 mg via INTRAVENOUS
  Filled 2014-10-30 (×2): qty 1

## 2014-10-30 NOTE — Progress Notes (Signed)
Called Dr. Lavetta Nielsen about patient's high blood pressure- 171/107 RR26.  Doctor put in order for prn blood pressure medication.  Christene Slates  10/30/2014  12:56 AM

## 2014-10-30 NOTE — Progress Notes (Signed)
   10/30/14 0715  Clinical Encounter Type  Visited With Patient;Patient not available;Health care provider  Visit Type Critical Care  Referral From Nurse  Consult/Referral To Chaplain  Paged to unit for rapid response and provided silent prayer for patient and staff.   Marjory Lies [phone]

## 2014-10-30 NOTE — Progress Notes (Signed)
Surgery Progress Note  S: Transfer to ICU this am for tachycardia.  No abdominal pain.  + headache O:Blood pressure 150/97, pulse 112, temperature 98 F (36.7 C), temperature source Oral, resp. rate 26, height 6\' 1"  (1.854 m), weight 229 lb 11.5 oz (104.2 kg), SpO2 96 %. GEN: NAD/A&Ox3 ABD: soft, min tender, dressing c/d/i  Bili 4.5  A/P 64 yo s/p colostomy reversal, in ICU - BP/HR control - NPO - head ct

## 2014-10-30 NOTE — Progress Notes (Signed)
Called for rapid response.  Patient tachycardic, hypertensive, with significant headache.  EKG shows afib.  Tranferred to ICU to get diltiazem + diltiazem drip.  Will draw labs, attempt to improve HR.  Will require head CT to r/o bleed.

## 2014-10-30 NOTE — Consult Note (Signed)
[facility] Consultation     ASSESSMENT/PLAN    PULMONARY  A stable, currently with minimal oxygen requirements.  CARDIOVASCULAR  A: Acute atrial fibrillation with rapid ventricular rate. -Hypertension-now with headache, possibly secondary to hypertension. P:  -We'll continue the patient on IV Cardizem for rate control. -Have restarted the patient's home dose of metoprolol 25 mg by mouth twice a day. -We will avoid full dose anticoagulation at this time as the patient is post-op. -Enalapril IV every 6 when necessary for hypertension, will monitor closely.  RENAL A:  Acute kidney injury, now improved. Estimated GFR now above 60.  Continue gentle hydration. We'll monitor closely.  GASTROINTESTINAL A: Currently on Protonix IV  HEMATOLOGIC A:  Colon cancer, now postop day 1 from colostomy reversal. -History of DVT, completed 6 months of therapy, we'll continue DVT prophylaxis with enoxaparin.  INFECTIOUS A:   no acute issues at this time. -History of Escherichia coli and urine  BCx- UC - Spu- Abx: None  ENDOCRINE A: Monitor blood sugars  NEUROLOGIC A:   headache, suspect due to hypertension.   MAJOR EVENTS/TEST RESULTS:   INDWELLING DEVICES:: .Urinary catheter  10/29/2014   MICRO DATA: MRSA PCR negative Urine -- Blood- Resp ---  History of Escherichia coli urine culture from this 06/19/2014.  ANTIMICROBIALS:    --------------------------------------- CT abdomen images and reports from 09/30/2014 reviewed, as well as chest x-ray 10/19/2014, these were unremarkable. ---------------------------------------   Name: James Keller MRN: [id number] DOB: Jul 23, 1950    ADMISSION DATE:  10/28/2014 CONSULTATION DATE:  10/30/2014  REFERRING MD :  Dr. Rexene Edison.   CHIEF COMPLAINT:  Dyspnea.    HISTORY OF PRESENT ILLNESS:  The patient is a 64 year old American male with a history significant for stage IIIB colon cancer,  status post 12 cycles of chemotherapy with no evidence of residual disease on most recent CT scan. He also has a history of DVT for which was treated with Coumadin and was subsequently discontinued. His course, complicated by development of neuropathy for which he takes gabapentin. The patient presented to the hospital for an elective reversal of colostomy on 10/28/2014, he underwent the surgery. At that time which included extensive lysis of adhesions. Patient has a history of tachycardia. He was on metoprolol at home 25 mg twice a day, this cannot be given in the inpatient setting as the patient was nothing by mouth and his blood pressures were borderline low. However, earlier this morning. His heart rates rapidly increased to a heart rate of 174, he was also noted to be hypertensive at that time. The patient has no particular complaints at this time and his pain is well-controlled. He denies dyspnea. He does complain of a frontal headache. His creatinine at the time of admission was 1.5 with a BUN of 16, anion gap was 7. At that time.   PAST MEDICAL HISTORY :  Past Medical History  Diagnosis Date  . DVT (deep venous thrombosis) (HCC)     RIGHT LEG/ HX OF  . Colon cancer ([employer])     colon/CHEMO THERAPY  . S/P chemotherapy, time since less than 4 weeks   . Hypertension   . 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 DVT (deep venous thrombosis) ([place])   . Atrial fibrillation ([place])   . Lower extremity edema     Rt leg   Past Surgical History  Procedure Laterality Date  . Colon resection  12/19/2013    colostomy  .  Leg surgery    . Tunneled venous port placement    . Colonoscopy with propofol N/A 09/14/2014    Procedure: COLONOSCOPY WITH PROPOFOL THROUGH COLOSTOMY AND COLONOSCOPY THRU RECTUM;  Surgeon: Lucilla Lame, MD;  Location: [place];  Service: Endoscopy;  Laterality: N/A;  TRANSVERSE COLON POLYP AND SIGMOID COLON POLYP  . Femur fracture surgery Right   .  Port a cath injection ([facility] hx) Right   . Colostomy reversal N/A 10/28/2014    Procedure: COLOSTOMY REVERSAL;  Surgeon: Marlyce Huge, MD;  Location: [facility];  Service: General;  Laterality: N/A;  . Lysis of adhesion  10/28/2014    Procedure: LYSIS OF ADHESION;  Surgeon: Marlyce Huge, MD;  Location: [facility];  Service: General;;   Prior to Admission medications   Medication Sig Start Date End Date Taking? Authorizing Provider  bisacodyl (BISACODYL) 5 MG EC tablet Take 1 tablet (5 mg total) by mouth once. At 8:00 AM, take 4 Dulcolax tablets (5 mg each). 10/15/14  Yes Marlyce Huge, MD  gabapentin (NEURONTIN) 300 MG capsule Take 1 capsule (300 mg total) by mouth 3 (three) times daily. 09/07/14  Yes Lloyd Huger, MD  metoprolol tartrate (LOPRESSOR) 25 MG tablet Take 25 mg by mouth 2 (two) times daily. Am and pm 12/27/13  Yes Historical Provider, MD  nicotine (NICODERM CQ - DOSED IN MG/24 HOURS) 14 mg/24hr patch Place 14 mg onto the skin as needed.    Yes Historical Provider, MD  polyethylene glycol powder (GLYCOLAX/MIRALAX) powder Take 255 g by mouth once. At 2:00 PM, start Miralax Prep (255 gram bottle). 10/15/14  Yes Marlyce Huge, MD   Allergies  Allergen Reactions  . No Known Allergies     FAMILY HISTORY:  Family History  Problem Relation Age of Onset  . Hypertension Mother    SOCIAL HISTORY:  reports that he has been smoking Cigarettes.  He has a 27 pack-year smoking history. He has never used smokeless tobacco. He reports that he drinks about 3.6 oz of alcohol per week. He reports that he uses illicit drugs (Marijuana).  REVIEW OF SYSTEMS:   Constitutional: Feels well. Cardiovascular: No chest pain.  Pulmonary: Denies dyspnea.   The remainder of systems were reviewed and were found to be negative other than what is documented in the HPI.    VITAL SIGNS: Temp:  [97.6 F (36.4 C)-99.6 F (37.6 C)] 98 F (36.7 C) (10/07 0730) Pulse Rate:   [41-174] 117 (10/07 0900) Resp:  [16-34] 27 (10/07 0900) BP: (124-185)/(74-113) 155/113 mmHg (10/07 0900) SpO2:  [90 %-97 %] 95 % (10/07 0900) Weight:  [104.2 kg (229 lb 11.5 oz)] 104.2 kg (229 lb 11.5 oz) (10/07 0800) HEMODYNAMICS:   VENTILATOR SETTINGS:   INTAKE / OUTPUT:  Intake/Output Summary (Last 24 hours) at 10/30/14 0938 Last data filed at 10/30/14 2542  Gross per 24 hour  Intake   3104 ml  Output   2650 ml  Net    454 ml    Physical Examination:   VS: BP 155/113 mmHg  Pulse 117  Temp(Src) 98 F (36.7 C) (Oral)  Resp 27  Ht 6\' 1"  (1.854 m)  Wt 104.2 kg (229 lb 11.5 oz)  BMI 30.31 kg/m2  SpO2 95%  General Appearance: No distress  Neuro:without focal findings, mental status, speech normal, HEENT: PERRLA, EOM intact, no ptosis, no other lesions noticed;  Pulmonary: normal breath sounds., diaphragmatic excursion normal.  CardiovascularNormal S1,S2.  No m/r/g.    Abdomen: Benign, Soft, non-tender, No  masses,  Renal:  No costovertebral tenderness  GU:  Not performed at this time. Endoc: No evident thyromegaly, no signs of acromegaly. Skin:   warm, no rashes, no ecchymosis  Extremities: normal, no cyanosis, clubbing,   LABS: Reviewed   LABORATORY PANEL:   CBC  Recent Labs Lab 10/30/14 0734  WBC 7.7  HGB 16.6  HCT 48.5  PLT 120*    Chemistries   Recent Labs Lab 10/30/14 0734  NA 138  K 3.6  CL 104  CO2 25  GLUCOSE 122*  BUN 11  CREATININE 1.18  CALCIUM 9.3  AST 20  ALT 20  ALKPHOS 76  BILITOT 4.5*    No results for input(s): GLUCAP in the last 168 hours. No results for input(s): PHART, PCO2ART, PO2ART in the last 168 hours.  Recent Labs Lab 10/29/14 0429 10/30/14 0734  AST 20 20  ALT 24 20  ALKPHOS 53 76  BILITOT 1.9* 4.5*  ALBUMIN 3.5 3.4*    Cardiac Enzymes  Recent Labs Lab 10/30/14 0734  TROPONINI <0.03    RADIOLOGY:  No results found.     --Marda Stalker, MD.  Board Certified in Internal Medicine,  Pulmonary Medicine, [place], and Sleep Medicine.   [facility]  Patricia Pesa, M.D.  Vilinda Boehringer, M.D.  Merton Border, M.D   10/30/2014, 9:38 AM  [place].  I have personally obtained a history, examined the patient, evaluated laboratory and imaging results, formulated the assessment and plan and placed orders.  case discussed and coordinated care with the critical care nurse, ICU pharmacist, and with surgical service. The Patient requires high complexity decision making for assessment and support, frequent evaluation and titration of therapies, application of advanced monitoring technologies and extensive interpretation of multiple databases. The patient has critical illness that could lead imminently to failure of 1 or more organ systems and requires the highest level of physician preparedness to intervene.  Critical Care Time devoted to patient care services described in this note is 35 minutes and is exclusive of time spent in procedures.

## 2014-10-30 NOTE — Progress Notes (Signed)
Dawn Songster called Dr. Adonis Huguenin at [phone] in regards to patient's HR of 171 and blood pressure of 151/110.  Doctor will be coming up to see patient.  Phoebe Sharps N  10/30/2014  7:03 AM

## 2014-10-30 NOTE — Progress Notes (Signed)
RN notified Dr. Rexene Edison that patient's heart rate remains elevated 130-160 fluctuating on cardizem drip. RN asked if patient can take oral meds. MD stated "please ask Dr. Ashby Dawes to look at his heart rate and manage his meds and he can take PO meds."

## 2014-10-30 NOTE — Progress Notes (Signed)
A&Ox4. Denies pain.  Afib/flutter per cardiac monitor, rate controlled in the 90's on cardizem drip.  No respiratory distress noted. Dressing to abdomen is intact with no drainage.

## 2014-10-30 NOTE — Progress Notes (Signed)
   10/30/14 1015  Clinical Encounter Type  Visited With Patient and family together  Visit Type Follow-up;Spiritual support  Referral From Family  Consult/Referral To Chaplain  Spiritual Encounters  Spiritual Needs Prayer;Emotional  Stress Factors  Patient Stress Factors Other (Comment)  Family Stress Factors Health changes  Chaplain rounded in the unit and was passing by patient's door. Extended an offer to assist and provide care as applicable. Patient's family requested prayer and I offered it. Chaplain Birney Belshe A. Harrel Ferrone Ext. [phone]

## 2014-10-30 NOTE — Progress Notes (Signed)
RN spoke with Dr. Ashby Dawes about elevated blood pressure 154/100.  MD gave order to change parameters for vasotec to use PRN med if diastolic is 95 or greater as well as systolic >333.

## 2014-10-31 ENCOUNTER — Inpatient Hospital Stay: Payer: Medicaid Other

## 2014-10-31 DIAGNOSIS — I4891 Unspecified atrial fibrillation: Secondary | ICD-10-CM

## 2014-10-31 LAB — CBC
HEMATOCRIT: 47.9 % (ref 40.0–52.0)
Hemoglobin: 15.7 g/dL (ref 13.0–18.0)
MCH: 28.4 pg (ref 26.0–34.0)
MCHC: 32.8 g/dL (ref 32.0–36.0)
MCV: 86.5 fL (ref 80.0–100.0)
PLATELETS: 142 10*3/uL — AB (ref 150–440)
RBC: 5.54 MIL/uL (ref 4.40–5.90)
RDW: 15.9 % — AB (ref 11.5–14.5)
WBC: 5.3 10*3/uL (ref 3.8–10.6)

## 2014-10-31 MED ORDER — HYDROMORPHONE HCL 1 MG/ML IJ SOLN
0.5000 mg | INTRAMUSCULAR | Status: DC | PRN
Start: 2014-10-31 — End: 2014-11-09
  Administered 2014-10-31 – 2014-11-09 (×49): 1 mg via INTRAVENOUS
  Filled 2014-10-31 (×49): qty 1

## 2014-10-31 NOTE — Progress Notes (Signed)
Surgery Progress Note  S: Reports some emesis overnight, headache improved O:Blood pressure 169/103, pulse 112, temperature 98.7 F (37.1 C), temperature source Oral, resp. rate 23, height 6\' 1"  (1.854 m), weight 229 lb 11.5 oz (104.2 kg), SpO2 94 %. GEN: NAD/A&Ox3 ABD: soft, distended, dressing c/d/i  A/P 64 yo s/p ostomy reversal, c/b postop afib - appreciate ICU assistance - KUB to eval for need for NG

## 2014-10-31 NOTE — Consult Note (Signed)
[facility] Consultation     ASSESSMENT/PLAN    PULMONARY  A stable, currently with minimal oxygen requirements.  CARDIOVASCULAR  A: Acute atrial fibrillation with rapid ventricular rate. P:  -We'll continue the patient on IV Cardizem for rate control. -Have restarted the patient's home dose of metoprolol 25 mg by mouth twice a day. -We will avoid full dose anticoagulation at this time as the patient is post-op.  RENAL A:  Acute kidney injury, now improved. Estimated GFR now above 60.  Continue gentle hydration. We'll monitor closely.  GASTROINTESTINAL A: Currently on Protonix IV -follow up surgery recs -need to assess nutriotional needs  HEMATOLOGIC A:  Colon cancer, now postop day 1 from colostomy reversal. -History of DVT, completed 6 months of therapy, we'll continue DVT prophylaxis with enoxaparin.  INFECTIOUS A:   no acute issues at this time. -History of Escherichia coli and urine  BCx- UC - Spu- Abx: None  ENDOCRINE A: Monitor blood sugars  NEUROLOGIC A:   headache, suspect due to hypertension.   MAJOR EVENTS/TEST RESULTS:   INDWELLING DEVICES:: .Urinary catheter  10/29/2014   MICRO DATA: MRSA PCR negative Urine -- Blood- Resp ---  History of Escherichia coli urine culture from this 06/19/2014.  ANTIMICROBIALS:    --------------------------------------- CT abdomen images and reports from 09/30/2014 reviewed, as well as chest x-ray 10/19/2014, these were unremarkable. ---------------------------------------   Name: James Keller MRN: [id number] DOB: Sep 16, 1950    ADMISSION DATE:  10/28/2014 CONSULTATION DATE:  10/30/2014  REFERRING MD :  Dr. Rexene Edison.   CHIEF COMPLAINT:  Dyspnea.    HISTORY OF PRESENT ILLNESS:  Remains slight SOB from abd pain,  On cardizem infusion, on dilauded infusion   PAST MEDICAL HISTORY :  Past Medical History  Diagnosis Date  . DVT (deep venous thrombosis) (HCC)    RIGHT LEG/ HX OF  . Colon cancer ([place])     colon/CHEMO THERAPY  . S/P chemotherapy, time since less than 4 weeks   . Hypertension   . 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 DVT (deep venous thrombosis) ([place])   . Atrial fibrillation ([place])   . Lower extremity edema     Rt leg   Past Surgical History  Procedure Laterality Date  . Colon resection  12/19/2013    colostomy  . Leg surgery    . Tunneled venous port placement    . Colonoscopy with propofol N/A 09/14/2014    Procedure: COLONOSCOPY WITH PROPOFOL THROUGH COLOSTOMY AND COLONOSCOPY THRU RECTUM;  Surgeon: Lucilla Lame, MD;  Location: [place];  Service: Endoscopy;  Laterality: N/A;  TRANSVERSE COLON POLYP AND SIGMOID COLON POLYP  . Femur fracture surgery Right   . Port a cath injection ([facility] hx) Right   . Colostomy reversal N/A 10/28/2014    Procedure: COLOSTOMY REVERSAL;  Surgeon: Marlyce Huge, MD;  Location: [facility];  Service: General;  Laterality: N/A;  . Lysis of adhesion  10/28/2014    Procedure: LYSIS OF ADHESION;  Surgeon: Marlyce Huge, MD;  Location: [facility];  Service: General;;   Prior to Admission medications   Medication Sig Start Date End Date Taking? Authorizing Provider  bisacodyl (BISACODYL) 5 MG EC tablet Take 1 tablet (5 mg total) by mouth once. At 8:00 AM, take 4 Dulcolax tablets (5 mg each). 10/15/14  Yes Marlyce Huge, MD  gabapentin (NEURONTIN) 300 MG capsule Take 1 capsule (300 mg total) by mouth 3 (three) times daily. 09/07/14  Yes Lloyd Huger, MD  metoprolol tartrate (LOPRESSOR) 25 MG tablet Take 25 mg by mouth 2 (two) times daily. Am and pm 12/27/13  Yes Historical Provider, MD  nicotine (NICODERM CQ - DOSED IN MG/24 HOURS) 14 mg/24hr patch Place 14 mg onto the skin as needed.    Yes Historical Provider, MD  polyethylene glycol powder (GLYCOLAX/MIRALAX) powder Take 255 g by mouth once. At 2:00 PM, start Miralax Prep (255 gram bottle). 10/15/14   Yes Marlyce Huge, MD   Allergies  Allergen Reactions  . No Known Allergies     FAMILY HISTORY:  Family History  Problem Relation Age of Onset  . Hypertension Mother    SOCIAL HISTORY:  reports that he has been smoking Cigarettes.  He has a 27 pack-year smoking history. He has never used smokeless tobacco. He reports that he drinks about 3.6 oz of alcohol per week. He reports that he uses illicit drugs (Marijuana).  REVIEW OF SYSTEMS:   Constitutional: Feels well. Cardiovascular: No chest pain.  Pulmonary: Denies dyspnea.   The remainder of systems were reviewed and were found to be negative other than what is documented in the HPI.    VITAL SIGNS: Temp:  [98.6 F (37 C)-98.8 F (37.1 C)] 98.7 F (37.1 C) (10/07 2000) Pulse Rate:  [54-129] 114 (10/08 0900) Resp:  [18-32] 30 (10/08 0900) BP: (125-169)/(80-108) 148/94 mmHg (10/08 0900) SpO2:  [87 %-97 %] 87 % (10/08 0900) HEMODYNAMICS:   VENTILATOR SETTINGS:   INTAKE / OUTPUT:  Intake/Output Summary (Last 24 hours) at 10/31/14 1005 Last data filed at 10/31/14 0600  Gross per 24 hour  Intake   2195 ml  Output   2025 ml  Net    170 ml    Physical Examination:   VS: BP 148/94 mmHg  Pulse 114  Temp(Src) 98.7 F (37.1 C) (Oral)  Resp 30  Ht 6\' 1"  (1.854 m)  Wt 229 lb 11.5 oz (104.2 kg)  BMI 30.31 kg/m2  SpO2 87%  General Appearance: No distress  Neuro:without focal findings, mental status, speech normal, HEENT: PERRLA, EOM intact, no ptosis, no other lesions noticed;  Pulmonary: normal breath sounds., diaphragmatic excursion normal.  CardiovascularNormal S1,S2.  No m/r/g.    Abdomen: Benign, Soft, non-tender, No masses,  Renal:  No costovertebral tenderness  GU:  Not performed at this time. Endoc: No evident thyromegaly, no signs of acromegaly. Skin:   warm, no rashes, no ecchymosis  Extremities: normal, no cyanosis, clubbing,   LABS: Reviewed   LABORATORY PANEL:   CBC  Recent Labs Lab  10/31/14 0548  WBC 5.3  HGB 15.7  HCT 47.9  PLT 142*    Chemistries   Recent Labs Lab 10/30/14 0734 10/30/14 1420  NA 138 138  K 3.6 3.6  CL 104 103  CO2 25 26  GLUCOSE 122* 136*  BUN 11 10  CREATININE 1.18 1.17  CALCIUM 9.3 9.3  AST 20  --   ALT 20  --   ALKPHOS 76  --   BILITOT 4.5*  --     No results for input(s): GLUCAP in the last 168 hours. No results for input(s): PHART, PCO2ART, PO2ART in the last 168 hours.  Recent Labs Lab 10/29/14 0429 10/30/14 0734  AST 20 20  ALT 24 20  ALKPHOS 53 76  BILITOT 1.9* 4.5*  ALBUMIN 3.5 3.4*    Cardiac Enzymes  Recent Labs Lab 10/30/14 1937  TROPONINI <0.03    RADIOLOGY:  Dg Abd 1 View  10/31/2014   CLINICAL DATA:  Colon cancer.  Vomiting.  Tachycardia.  EXAM: ABDOMEN - 1 VIEW  COMPARISON:  09/30/2014 CT abdomen/ pelvis.  FINDINGS: Skin staples overlie the midline and left mid abdomen. There are a few mildly dilated small bowel loops in the central abdomen. There is mild gaseous distention of the residual proximal colon. No evidence of pneumatosis or pneumoperitoneum. Partially visualized is an intra medullary rod overlying the right proximal femur.  IMPRESSION: Mild gaseous distention of the small and residual proximal large bowel, in keeping with a mild postoperative adynamic ileus.   Electronically Signed   By: Ilona Sorrel M.D.   On: 10/31/2014 09:57   Ct Head Wo Contrast  10/30/2014   CLINICAL DATA:  Headache and hypertension since yesterday  EXAM: CT HEAD WITHOUT CONTRAST  TECHNIQUE: Contiguous axial images were obtained from the base of the skull through the vertex without intravenous contrast.  COMPARISON:  None.  FINDINGS: Mild chronic ischemic changes in the periventricular white matter of the parietal and occipital lobes. No mass effect, midline shift, or acute hemorrhage. Ventricular system is unremarkable. Mastoid air cells are clear. Patchy mucosal thickening and mucous opacification in the ethmoid air  cells and left sphenoid sinus. Small mucous retention cyst in the right maxillary sinus.  IMPRESSION: No acute intracranial pathology.   Electronically Signed   By: Marybelle Killings M.D.   On: 10/30/2014 13:13    I have personally obtained a history, examined the patient, evaluated Pertinent laboratory and RadioGraphic/imaging results, and  formulated the assessment and plan   The Patient requires high complexity decision making for assessment and support, frequent evaluation and titration of therapies.  Patient satisfied with Plan of action and management. All questions answered  Corrin Parker, M.D.  [facility]  Medical Director [place] Director [facility] Cardio-Pulmonary Department

## 2014-11-01 DIAGNOSIS — R0600 Dyspnea, unspecified: Secondary | ICD-10-CM

## 2014-11-01 LAB — CBC
HEMATOCRIT: 46.3 % (ref 40.0–52.0)
HEMOGLOBIN: 15.4 g/dL (ref 13.0–18.0)
MCH: 28.9 pg (ref 26.0–34.0)
MCHC: 33.4 g/dL (ref 32.0–36.0)
MCV: 86.6 fL (ref 80.0–100.0)
Platelets: 166 10*3/uL (ref 150–440)
RBC: 5.35 MIL/uL (ref 4.40–5.90)
RDW: 15.9 % — ABNORMAL HIGH (ref 11.5–14.5)
WBC: 5.9 10*3/uL (ref 3.8–10.6)

## 2014-11-01 LAB — COMPREHENSIVE METABOLIC PANEL
ALK PHOS: 84 U/L (ref 38–126)
ALT: 40 U/L (ref 17–63)
ANION GAP: 6 (ref 5–15)
AST: 33 U/L (ref 15–41)
Albumin: 3 g/dL — ABNORMAL LOW (ref 3.5–5.0)
BILIRUBIN TOTAL: 3.3 mg/dL — AB (ref 0.3–1.2)
BUN: 13 mg/dL (ref 6–20)
CALCIUM: 8.9 mg/dL (ref 8.9–10.3)
CO2: 30 mmol/L (ref 22–32)
Chloride: 102 mmol/L (ref 101–111)
Creatinine, Ser: 1.43 mg/dL — ABNORMAL HIGH (ref 0.61–1.24)
GFR calc non Af Amer: 51 mL/min — ABNORMAL LOW (ref >60)
GFR, EST AFRICAN AMERICAN: 59 mL/min — AB (ref >60)
Glucose, Bld: 122 mg/dL — ABNORMAL HIGH (ref 65–99)
Potassium: 4 mmol/L (ref 3.5–5.1)
SODIUM: 138 mmol/L (ref 135–145)
TOTAL PROTEIN: 6.9 g/dL (ref 6.5–8.1)

## 2014-11-01 MED ORDER — METOPROLOL TARTRATE 25 MG PO TABS
37.5000 mg | ORAL_TABLET | Freq: Two times a day (BID) | ORAL | Status: DC
  Administered 2014-11-01 – 2014-11-07 (×12): 37.5 mg via ORAL
  Filled 2014-11-01 (×9): qty 2
  Filled 2014-11-01 (×2): qty 1
  Filled 2014-11-01 (×2): qty 2

## 2014-11-01 MED ORDER — SODIUM CHLORIDE 0.9 % IV SOLN
3.0000 g | Freq: Four times a day (QID) | INTRAVENOUS | Status: DC
  Administered 2014-11-01 – 2014-11-06 (×19): 3 g via INTRAVENOUS
  Filled 2014-11-01 (×25): qty 3

## 2014-11-01 MED ORDER — METOPROLOL TARTRATE 25 MG PO TABS
12.5000 mg | ORAL_TABLET | Freq: Once | ORAL | Status: AC
  Administered 2014-11-01: 12.5 mg via ORAL
  Filled 2014-11-01: qty 1

## 2014-11-01 NOTE — Progress Notes (Signed)
Surgery Progress Note  S: No nausea/vomiting, hungry, no significant flatus O:Blood pressure 126/85, pulse 89, temperature 98.1 F (36.7 C), temperature source Oral, resp. rate 17, height 6\' 1"  (1.854 m), weight 229 lb 11.5 oz (104.2 kg), SpO2 97 %. GEN: NAD/A&Ox3 ABD: soft, moderate distention, nontender, purulence from mid-upper aspect of incision  A/P 64 yo s/p ostomy take down, significant LOA - some staples taken out, wet to dry tid - unasyn - restricted liquids

## 2014-11-01 NOTE — Consult Note (Signed)
[facility] Consultation     ASSESSMENT/PLAN  64 yo AAM admitted to ICU for post op care s/p colostomy reversal with afib with RVR plan to transition off cardizem infusion with oral cardizem  PULMONARY  A stable, currently with minimal oxygen requirements.  CARDIOVASCULAR  A: Acute atrial fibrillation with rapid ventricular rate. P:  -We'll transition from IV to oral cardizem: discussed with pharmacy for dosing -Have restarted the patient's metoprolol will increase to 37.5 and d/c cardizem infusion. -We will avoid full dose anticoagulation at this time as the patient is post-op.  RENAL A:  Acute kidney injury, now improved. Estimated GFR now above 60.  Continue gentle hydration. We'll monitor closely.  GASTROINTESTINAL A: Currently on Protonix IV -follow up surgery recs -need to assess nutriotional needs at some point  HEMATOLOGIC A:  Colon cancer, now postop day 3 from colostomy reversal. -History of DVT, completed 6 months of therapy, we'll continue DVT prophylaxis with enoxaparin.  INFECTIOUS A:   no acute issues at this time. -History of Escherichia coli and urine  BCx- UC - Spu- Abx: None  ENDOCRINE A: Monitor blood sugars  NEUROLOGIC A:   Headache-resolved   MAJOR EVENTS/TEST RESULTS:   INDWELLING DEVICES:: .Urinary catheter  10/29/2014   MICRO DATA: MRSA PCR negative Urine -- Blood- Resp ---  History of Escherichia coli urine culture from this 06/19/2014.  ANTIMICROBIALS:    --------------------------------------- CT abdomen images and reports from 09/30/2014 reviewed, as well as chest x-ray 10/19/2014, these were unremarkable. ---------------------------------------   Name: James Keller MRN: [id number] DOB: Aug 24, 1950    ADMISSION DATE:  10/28/2014 CONSULTATION DATE:  10/30/2014  REFERRING MD :  Dr. Rexene Edison.   CHIEF COMPLAINT:  Dyspnea.    HISTORY OF PRESENT ILLNESS:  Feels better today, will  attempt to wean off cardizem -passing gas, follow up gen surgery recs On cardizem infusion, on dilauded infusion   PAST MEDICAL HISTORY :  Past Medical History  Diagnosis Date  . DVT (deep venous thrombosis) (HCC)     RIGHT LEG/ HX OF  . Colon cancer ([place])     colon/CHEMO THERAPY  . S/P chemotherapy, time since less than 4 weeks   . Hypertension   . 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 DVT (deep venous thrombosis) ([place])   . Atrial fibrillation ([place])   . Lower extremity edema     Rt leg   Past Surgical History  Procedure Laterality Date  . Colon resection  12/19/2013    colostomy  . Leg surgery    . Tunneled venous port placement    . Colonoscopy with propofol N/A 09/14/2014    Procedure: COLONOSCOPY WITH PROPOFOL THROUGH COLOSTOMY AND COLONOSCOPY THRU RECTUM;  Surgeon: Lucilla Lame, MD;  Location: [place];  Service: Endoscopy;  Laterality: N/A;  TRANSVERSE COLON POLYP AND SIGMOID COLON POLYP  . Femur fracture surgery Right   . Port a cath injection ([facility] hx) Right   . Colostomy reversal N/A 10/28/2014    Procedure: COLOSTOMY REVERSAL;  Surgeon: Marlyce Huge, MD;  Location: [facility];  Service: General;  Laterality: N/A;  . Lysis of adhesion  10/28/2014    Procedure: LYSIS OF ADHESION;  Surgeon: Marlyce Huge, MD;  Location: [facility];  Service: General;;   Prior to Admission medications   Medication Sig Start Date End Date Taking? Authorizing Provider  bisacodyl (BISACODYL) 5 MG EC tablet Take 1 tablet (5 mg total) by mouth once.  At 8:00 AM, take 4 Dulcolax tablets (5 mg each). 10/15/14  Yes Marlyce Huge, MD  gabapentin (NEURONTIN) 300 MG capsule Take 1 capsule (300 mg total) by mouth 3 (three) times daily. 09/07/14  Yes Lloyd Huger, MD  metoprolol tartrate (LOPRESSOR) 25 MG tablet Take 25 mg by mouth 2 (two) times daily. Am and pm 12/27/13  Yes Historical Provider, MD  nicotine (NICODERM CQ - DOSED IN MG/24  HOURS) 14 mg/24hr patch Place 14 mg onto the skin as needed.    Yes Historical Provider, MD  polyethylene glycol powder (GLYCOLAX/MIRALAX) powder Take 255 g by mouth once. At 2:00 PM, start Miralax Prep (255 gram bottle). 10/15/14  Yes Marlyce Huge, MD   Allergies  Allergen Reactions  . No Known Allergies     FAMILY HISTORY:  Family History  Problem Relation Age of Onset  . Hypertension Mother    SOCIAL HISTORY:  reports that he has been smoking Cigarettes.  He has a 27 pack-year smoking history. He has never used smokeless tobacco. He reports that he drinks about 3.6 oz of alcohol per week. He reports that he uses illicit drugs (Marijuana).  REVIEW OF SYSTEMS:   Constitutional: Feels well. Cardiovascular: No chest pain.  Pulmonary: Denies dyspnea.   The remainder of systems were reviewed and were found to be negative other than what is documented in the HPI.    VITAL SIGNS: Temp:  [97.9 F (36.6 C)-99.5 F (37.5 C)] 98.1 F (36.7 C) (10/09 0800) Pulse Rate:  [80-116] 89 (10/09 0900) Resp:  [16-36] 17 (10/09 0900) BP: (108-145)/(73-99) 126/85 mmHg (10/09 0900) SpO2:  [93 %-97 %] 97 % (10/09 0900) HEMODYNAMICS:   VENTILATOR SETTINGS:   INTAKE / OUTPUT:  Intake/Output Summary (Last 24 hours) at 11/01/14 1207 Last data filed at 11/01/14 0700  Gross per 24 hour  Intake   1340 ml  Output   1365 ml  Net    -25 ml    Physical Examination:   VS: BP 126/85 mmHg  Pulse 89  Temp(Src) 98.1 F (36.7 C) (Oral)  Resp 17  Ht 6\' 1"  (1.854 m)  Wt 229 lb 11.5 oz (104.2 kg)  BMI 30.31 kg/m2  SpO2 97%  General Appearance: No distress  Neuro:without focal findings, mental status, speech normal, HEENT: PERRLA, EOM intact, no ptosis, no other lesions noticed;  Pulmonary: normal breath sounds., diaphragmatic excursion normal.  CardiovascularNormal S1,S2.  No m/r/g.    Abdomen: Benign, Soft, non-tender, No masses,  Renal:  No costovertebral tenderness  GU:  Not performed  at this time. Endoc: No evident thyromegaly, no signs of acromegaly. Skin:   warm, no rashes, no ecchymosis  Extremities: normal, no cyanosis, clubbing,   LABS: Reviewed   LABORATORY PANEL:   CBC  Recent Labs Lab 11/01/14 0422  WBC 5.9  HGB 15.4  HCT 46.3  PLT 166    Chemistries   Recent Labs Lab 11/01/14 0422  NA 138  K 4.0  CL 102  CO2 30  GLUCOSE 122*  BUN 13  CREATININE 1.43*  CALCIUM 8.9  AST 33  ALT 40  ALKPHOS 84  BILITOT 3.3*    No results for input(s): GLUCAP in the last 168 hours. No results for input(s): PHART, PCO2ART, PO2ART in the last 168 hours.  Recent Labs Lab 10/29/14 0429 10/30/14 0734 11/01/14 0422  AST 20 20 33  ALT 24 20 40  ALKPHOS 53 76 84  BILITOT 1.9* 4.5* 3.3*  ALBUMIN 3.5 3.4* 3.0*  Cardiac Enzymes  Recent Labs Lab 10/30/14 1937  TROPONINI <0.03    RADIOLOGY:  Dg Abd 1 View  10/31/2014   CLINICAL DATA:  Colon cancer.  Vomiting.  Tachycardia.  EXAM: ABDOMEN - 1 VIEW  COMPARISON:  09/30/2014 CT abdomen/ pelvis.  FINDINGS: Skin staples overlie the midline and left mid abdomen. There are a few mildly dilated small bowel loops in the central abdomen. There is mild gaseous distention of the residual proximal colon. No evidence of pneumatosis or pneumoperitoneum. Partially visualized is an intra medullary rod overlying the right proximal femur.  IMPRESSION: Mild gaseous distention of the small and residual proximal large bowel, in keeping with a mild postoperative adynamic ileus.   Electronically Signed   By: Ilona Sorrel M.D.   On: 10/31/2014 09:57   Ct Head Wo Contrast  10/30/2014   CLINICAL DATA:  Headache and hypertension since yesterday  EXAM: CT HEAD WITHOUT CONTRAST  TECHNIQUE: Contiguous axial images were obtained from the base of the skull through the vertex without intravenous contrast.  COMPARISON:  None.  FINDINGS: Mild chronic ischemic changes in the periventricular white matter of the parietal and occipital  lobes. No mass effect, midline shift, or acute hemorrhage. Ventricular system is unremarkable. Mastoid air cells are clear. Patchy mucosal thickening and mucous opacification in the ethmoid air cells and left sphenoid sinus. Small mucous retention cyst in the right maxillary sinus.  IMPRESSION: No acute intracranial pathology.   Electronically Signed   By: Marybelle Killings M.D.   On: 10/30/2014 13:13    I have personally obtained a history, examined the patient, evaluated Pertinent laboratory and RadioGraphic/imaging results, and  formulated the assessment and plan   The Patient requires high complexity decision making for assessment and support, frequent evaluation and titration of therapies.  Patient satisfied with Plan of action and management. All questions answered  Corrin Parker, M.D.  [facility]  Medical Director [place] Director [facility] Cardio-Pulmonary Department

## 2014-11-02 DIAGNOSIS — Z86718 Personal history of other venous thrombosis and embolism: Secondary | ICD-10-CM

## 2014-11-02 LAB — BASIC METABOLIC PANEL
Anion gap: 7 (ref 5–15)
BUN: 16 mg/dL (ref 6–20)
CALCIUM: 9 mg/dL (ref 8.9–10.3)
CO2: 30 mmol/L (ref 22–32)
CREATININE: 1.27 mg/dL — AB (ref 0.61–1.24)
Chloride: 102 mmol/L (ref 101–111)
GFR calc Af Amer: >60 mL/min (ref >60)
GFR, EST NON AFRICAN AMERICAN: 58 mL/min — AB (ref >60)
Glucose, Bld: 110 mg/dL — ABNORMAL HIGH (ref 65–99)
Potassium: 4 mmol/L (ref 3.5–5.1)
SODIUM: 139 mmol/L (ref 135–145)

## 2014-11-02 LAB — CBC
HCT: 46.5 % (ref 40.0–52.0)
Hemoglobin: 15.3 g/dL (ref 13.0–18.0)
MCH: 28.3 pg (ref 26.0–34.0)
MCHC: 32.9 g/dL (ref 32.0–36.0)
MCV: 86.1 fL (ref 80.0–100.0)
PLATELETS: 174 10*3/uL (ref 150–440)
RBC: 5.4 MIL/uL (ref 4.40–5.90)
RDW: 16 % — ABNORMAL HIGH (ref 11.5–14.5)
WBC: 5.6 10*3/uL (ref 3.8–10.6)

## 2014-11-02 MED ORDER — METOPROLOL TARTRATE 1 MG/ML IV SOLN
2.5000 mg | INTRAVENOUS | Status: DC | PRN
  Administered 2014-11-03: 5 mg via INTRAVENOUS
  Filled 2014-11-02 (×2): qty 5

## 2014-11-02 MED ORDER — HYDRALAZINE HCL 20 MG/ML IJ SOLN
10.0000 mg | INTRAMUSCULAR | Status: DC | PRN
Start: 2014-11-02 — End: 2014-11-05
  Administered 2014-11-03 – 2014-11-04 (×3): 20 mg via INTRAVENOUS
  Filled 2014-11-02 (×2): qty 2

## 2014-11-02 MED ORDER — DILTIAZEM HCL 25 MG/5ML IV SOLN
10.0000 mg | Freq: Once | INTRAVENOUS | Status: AC
  Administered 2014-11-02: 10 mg via INTRAVENOUS
  Filled 2014-11-02: qty 5

## 2014-11-02 MED ORDER — DILTIAZEM HCL 100 MG IV SOLR
5.0000 mg/h | INTRAVENOUS | Status: DC
  Filled 2014-11-02: qty 100

## 2014-11-02 MED ORDER — DILTIAZEM HCL 25 MG/5ML IV SOLN: 10.0000 mg | Freq: Once | INTRAVENOUS | Status: DC

## 2014-11-02 MED ORDER — DILTIAZEM HCL ER 60 MG PO CP12
60.0000 mg | ORAL_CAPSULE | Freq: Two times a day (BID) | ORAL | Status: DC
  Administered 2014-11-02 (×2): 60 mg via ORAL
  Filled 2014-11-02 (×3): qty 1

## 2014-11-02 NOTE — Progress Notes (Signed)
Initial Nutrition Assessment    INTERVENTION:   Coordination of Care: await diet progression   NUTRITION DIAGNOSIS:   Inadequate oral intake related to acute illness as evidenced by NPO status.   GOAL:   Patient will meet greater than or equal to 90% of their needs   MONITOR:    (Energy Intake, Anthropometrics, Digestive System, Electrolyte/Renal Profile)  REASON FOR ASSESSMENT:   NPO/Clear Liquid Diet    ASSESSMENT:    Pt with hx of hartman's procedure for obstructing colon cancer; CT post chemo showing no obvious recurrence of metastatic disease. On this admission, s/p lysis of adhesions with colostomy closure, acute afib with RVR on cardizem, AKI  Past Medical History  Diagnosis Date  . DVT (deep venous thrombosis) (HCC)     RIGHT LEG/ HX OF  . S/P chemotherapy, time since less than 4 weeks   . Hypertension   . 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 DVT (deep venous thrombosis) ([place])   . Atrial fibrillation ([place])   . Lower extremity edema     Rt leg  . Colon cancer ([place])     colon/CHEMO THERAPY    Diet Order:  Diet NPO time specified Except for: Ice Chips, Sips with Meds, 1 juice container per shift  Digestive System: no N/V, no flatus, abdomen soft, less distended  Nutrition Focused Physical Exam:  Unable to complete Nutrition-Focused physical exam at this time.   Electrolyte and Renal Profile:  Recent Labs Lab 10/30/14 1420 11/01/14 0422 11/02/14 0802  BUN 10 13 16   CREATININE 1.17 1.43* 1.27*  NA 138 138 139  K 3.6 4.0 4.0   Meds: D5-1/2NS with 50 ml/hr  Height:   Ht Readings from Last 1 Encounters:  10/30/14 6\' 1"  (1.854 m)    Weight: weight trend as per weight encounters below  Wt Readings from Last 1 Encounters:  10/30/14 229 lb 11.5 oz (104.2 kg)   Filed Weights   10/28/14 0748 10/30/14 0800  Weight: 234 lb (106.142 kg) 229 lb 11.5 oz (104.2 kg)    Wt Readings from Last 10 Encounters:  10/30/14  229 lb 11.5 oz (104.2 kg)  10/19/14 231 lb (104.781 kg)  10/19/14 232 lb (105.235 kg)  10/13/14 237 lb (107.502 kg)  10/07/14 232 lb 12.9 oz (105.6 kg)  09/14/14 223 lb (101.152 kg)  09/08/14 231 lb (104.781 kg)  09/02/14 225 lb 1.4 oz (102.1 kg)  07/22/14 218 lb 14.7 oz (99.3 kg)  07/08/14 214 lb 1.1 oz (97.101 kg)    BMI:  Body mass index is 30.31 kg/(m^2).  Estimated Nutritional Needs:   Kcal:  7412-8786 kcals (BEE 1885, 1.3 AF, 1.1-1.2 IF)   Protein:  114-135 g (1.1-1.3 g/kg)   Fluid:  2600-3120 mL (25-30 ml/kg)     HIGH Care Level  Kerman Passey MS, RD, LDN [phone] Pager

## 2014-11-02 NOTE — Progress Notes (Signed)
NAD No new complaints Remains on diltiazem gtt @ 5 mg/hr  Filed Vitals:   11/02/14 1300 11/02/14 1400 11/02/14 1427 11/02/14 1500  BP: 118/92 137/100  141/97  Pulse: 104 114 97 114  Temp:   97.8 F (36.6 C)   TempSrc:   Axillary   Resp: 18 23 23 23   Height:      Weight:      SpO2: 93% 91% 92% 93%   NAD HEENT WNL No JVD Chest clear anteriorly IRIR, mildly tachy, no M Abd mildly distended, diminished BS, minimally tender Ext warm without edema  BMP Latest Ref Rng 11/02/2014 11/01/2014 10/30/2014  Glucose 65 - 99 mg/dL 110(H) 122(H) 136(H)  BUN 6 - 20 mg/dL 16 13 10   Creatinine 0.61 - 1.24 mg/dL 1.27(H) 1.43(H) 1.17  Sodium 135 - 145 mmol/L 139 138 138  Potassium 3.5 - 5.1 mmol/L 4.0 4.0 3.6  Chloride 101 - 111 mmol/L 102 102 103  CO2 22 - 32 mmol/L 30 30 26   Calcium 8.9 - 10.3 mg/dL 9.0 8.9 9.3    CBC Latest Ref Rng 11/02/2014 11/01/2014 10/31/2014  WBC 3.8 - 10.6 K/uL 5.6 5.9 5.3  Hemoglobin 13.0 - 18.0 g/dL 15.3 15.4 15.7  Hematocrit 40.0 - 52.0 % 46.5 46.3 47.9  Platelets 150 - 440 K/uL 174 166 142(L)    CXR: NNF  IMPRESSION: S/P laparoscopic closure of colostomy Post op, new onset AFRVR - rate now controlled H/O DVT Htn, controlled  PLAN/REC: Transition from IV to po diltiazem Cont metoprolol Cont PRN hydralazine - dose adjusted. Goal SBP < 170 mmHg OK to transfer to telemetry bed from PCCM perspective Cont DVT px with enoxaparin  I have asked for Cardiology to evaluate for new onset AF  It would be desirable to get him on full anticoagulatino when OK with surgery After transfer out of ICU, PCCM will sign off. Please call if we can be of further assistance  Consider [facility] involvement if there are medical needs that are not covered by Cards   Merton Border, MD PCCM service Mobile [phone] Pager [phone]

## 2014-11-02 NOTE — Progress Notes (Signed)
Notified [facility] of patient on IV Cardizem and needs to transition to PO cardizem. Waiting call back.

## 2014-11-02 NOTE — Progress Notes (Addendum)
Informed by nursing supervisor patient is in atrial fibrillation rapid ventricular response heart into the 170s will initiate Cardizem bolus 10 followed by Cardizem drip with goal heart rate of less than 120 transfer to the ICU for stepdown care for consult follow

## 2014-11-02 NOTE — Progress Notes (Signed)
[facility] call back and stated to continue IV Cardizem until AM

## 2014-11-02 NOTE — Progress Notes (Addendum)
Patient ID: James Keller, male   DOB: 1950/03/16, 64 y.o.   MRN: [id number] Surgery  POD 5  S/P lysis of adhesions laparotomy colostomy closure  The patient is rate controlled. He is on a diltiazem drip. He denies any abdominal pain. There has been no passage of flatus. No nausea no vomiting. He feels less distended. Foley is scheduled to come out today.  Filed Vitals:   11/02/14 0500 11/02/14 0600 11/02/14 0700 11/02/14 0750  BP: 142/83 111/73 130/67   Pulse: 99 95 103   Temp:    99.5 F (37.5 C)  TempSrc:    Oral  Resp: 23 23 26    Height:      Weight:      SpO2: 98% 94% 94%     PE: Alert and oriented. Abdomen is soft. Mildly distended. Clean. Dressing was changed. No clear undrained fluid or pus.  Labs  a CBC and basic metabolic panel is pending this morning and has been ordered.  CBC Latest Ref Rng 11/01/2014 10/31/2014 10/30/2014  WBC 3.8 - 10.6 K/uL 5.9 5.3 7.7  Hemoglobin 13.0 - 18.0 g/dL 15.4 15.7 16.6  Hematocrit 40.0 - 52.0 % 46.3 47.9 48.5  Platelets 150 - 440 K/uL 166 142(L) 120(L)   CMP Latest Ref Rng 11/01/2014 10/30/2014 10/30/2014  Glucose 65 - 99 mg/dL 122(H) 136(H) 122(H)  BUN 6 - 20 mg/dL 13 10 11   Creatinine 0.61 - 1.24 mg/dL 1.43(H) 1.17 1.18  Sodium 135 - 145 mmol/L 138 138 138  Potassium 3.5 - 5.1 mmol/L 4.0 3.6 3.6  Chloride 101 - 111 mmol/L 102 103 104  CO2 22 - 32 mmol/L 30 26 25   Calcium 8.9 - 10.3 mg/dL 8.9 9.3 9.3  Total Protein 6.5 - 8.1 g/dL 6.9 - 7.0  Total Bilirubin 0.3 - 1.2 mg/dL 3.3(H) - 4.5(H)  Alkaline Phos 38 - 126 U/L 84 - 76  AST 15 - 41 U/L 33 - 20  ALT 17 - 63 U/L 40 - 20   I/O last 3 completed shifts: In: 4322.2 [I.V.:3922.2; IV Piggyback:400] Out: 9449 [Urine:1590]      IMP:  The patient is postoperative day #5 status post complex lysis of adhesions colostomy closure. Rate seems to be well-controlled. I do not see any reason why he can't take oral diltiazem and his drip can be discontinued. I'll leave this to critical care  services.  Plan  discontinue Foley. Continue ice chips. Mobilize. Once on oral rate control agents can be transferred back to the floor. He will need urological evaluation once discharge.

## 2014-11-03 DIAGNOSIS — I4891 Unspecified atrial fibrillation: Secondary | ICD-10-CM | POA: Diagnosis not present

## 2014-11-03 LAB — GLUCOSE, CAPILLARY: Glucose-Capillary: 112 mg/dL — ABNORMAL HIGH (ref 65–99)

## 2014-11-03 MED ORDER — DIGOXIN 250 MCG PO TABS
0.2500 mg | ORAL_TABLET | Freq: Every day | ORAL | Status: DC
  Administered 2014-11-03 – 2014-11-11 (×8): 0.25 mg via ORAL
  Filled 2014-11-03 (×10): qty 1

## 2014-11-03 MED ORDER — DILTIAZEM HCL 25 MG/5ML IV SOLN
10.0000 mg | Freq: Once | INTRAVENOUS | Status: AC
  Administered 2014-11-03: 10 mg via INTRAVENOUS
  Filled 2014-11-03: qty 5

## 2014-11-03 MED ORDER — DILTIAZEM HCL ER 60 MG PO CP12
60.0000 mg | ORAL_CAPSULE | Freq: Once | ORAL | Status: AC
  Administered 2014-11-03: 60 mg via ORAL
  Filled 2014-11-03: qty 1

## 2014-11-03 MED ORDER — DILTIAZEM HCL ER 90 MG PO CP12
90.0000 mg | ORAL_CAPSULE | Freq: Two times a day (BID) | ORAL | Status: DC
  Filled 2014-11-03 (×3): qty 1

## 2014-11-03 MED ORDER — AMIODARONE HCL IN DEXTROSE 360-4.14 MG/200ML-% IV SOLN
30.0000 mg/h | INTRAVENOUS | Status: DC
  Administered 2014-11-03 – 2014-11-06 (×5): 30 mg/h via INTRAVENOUS
  Filled 2014-11-03 (×15): qty 200

## 2014-11-03 MED ORDER — DILTIAZEM HCL 30 MG PO TABS: 60.0000 mg | ORAL_TABLET | Freq: Four times a day (QID) | ORAL | Status: DC

## 2014-11-03 MED ORDER — DILTIAZEM HCL 100 MG IV SOLR
5.0000 mg/h | INTRAVENOUS | Status: DC
  Administered 2014-11-03 (×2): 10 mg/h via INTRAVENOUS
  Administered 2014-11-04 – 2014-11-05 (×2): 5 mg/h via INTRAVENOUS
  Filled 2014-11-03 (×4): qty 100

## 2014-11-03 MED ORDER — DILTIAZEM LOAD VIA INFUSION
10.0000 mg | Freq: Once | INTRAVENOUS | Status: DC
  Filled 2014-11-03: qty 10

## 2014-11-03 MED ORDER — AMIODARONE HCL IN DEXTROSE 360-4.14 MG/200ML-% IV SOLN
60.0000 mg/h | INTRAVENOUS | Status: AC
  Administered 2014-11-03: 60 mg/h via INTRAVENOUS
  Filled 2014-11-03: qty 200

## 2014-11-03 MED ORDER — DILTIAZEM HCL 30 MG PO TABS
30.0000 mg | ORAL_TABLET | Freq: Four times a day (QID) | ORAL | Status: DC
  Administered 2014-11-03 – 2014-11-11 (×31): 30 mg via ORAL
  Filled 2014-11-03 (×32): qty 1

## 2014-11-03 NOTE — Progress Notes (Signed)
Patient ID: James Keller, male   DOB: 07/27/1950, 65 y.o.   MRN: [id number]  Surgery  POD 6  S/P lysis of adhesions takedown colostomy  Patient is passing gas pain seems well controlled. He was transferred to the floor on telemetry and rate seen to be controlled. Cardiology was consulted.   Filed Vitals:   11/02/14 1500 11/02/14 2107 11/02/14 2356 11/03/14 0415  BP: 141/97 154/90 153/83 145/97  Pulse: 114 62 117 93  Temp:  99.4 F (37.4 C)  99.4 F (37.4 C)  TempSrc:  Oral  Oral  Resp: 23 16  20   Height:      Weight:      SpO2: 93% 91% 89% 100%    PE:  Abdomen is soft and much less distended than yesterday dressing is intact.  Labs  CBC Latest Ref Rng 11/02/2014 11/01/2014 10/31/2014  WBC 3.8 - 10.6 K/uL 5.6 5.9 5.3  Hemoglobin 13.0 - 18.0 g/dL 15.3 15.4 15.7  Hematocrit 40.0 - 52.0 % 46.5 46.3 47.9  Platelets 150 - 440 K/uL 174 166 142(L)   CMP Latest Ref Rng 11/02/2014 11/01/2014 10/30/2014  Glucose 65 - 99 mg/dL 110(H) 122(H) 136(H)  BUN 6 - 20 mg/dL 16 13 10   Creatinine 0.61 - 1.24 mg/dL 1.27(H) 1.43(H) 1.17  Sodium 135 - 145 mmol/L 139 138 138  Potassium 3.5 - 5.1 mmol/L 4.0 4.0 3.6  Chloride 101 - 111 mmol/L 102 102 103  CO2 22 - 32 mmol/L 30 30 26   Calcium 8.9 - 10.3 mg/dL 9.0 8.9 9.3  Total Protein 6.5 - 8.1 g/dL - 6.9 -  Total Bilirubin 0.3 - 1.2 mg/dL - 3.3(H) -  Alkaline Phos 38 - 126 U/L - 84 -  AST 15 - 41 U/L - 33 -  ALT 17 - 63 U/L - 40 -     IMP stable and improving. Ileus seems to be resolving.  Plan  clear liquid diet mobilize.

## 2014-11-03 NOTE — Progress Notes (Signed)
Dr. Verdell Carmine notified Primary RN that he spoke to Dr. Clayborn Bigness and that pt was to be transferred to CCU. Report was called and given to [facility] with all questions answered. Pt was transferred with chart and medication.

## 2014-11-03 NOTE — Progress Notes (Signed)
Dr. Verdell Carmine notified of pt increased BP and HR. Orders received for Cardizem 30 mg PO q6HR

## 2014-11-03 NOTE — Care Management (Signed)
Patient transferring back to ICU (cardiac) per attending Dr Henrietta Dine.

## 2014-11-03 NOTE — Progress Notes (Signed)
Pt HR 140's Aflutter;Primary RN spoke to  Dr. Philomena Course in person of Pt HR; IV cardizem orderd for Pt. Nurse supervisor notified and Ann administered medication. Primary RN to continue to monitor pt.

## 2014-11-03 NOTE — Progress Notes (Signed)
Subjective:   still having significant tachycardia denies any chest pain still has some mild abdominal discomfort.  Objective:  Vital Signs in the last 24 hours: Temp:  [99 F (37.2 C)-99.4 F (37.4 C)] 99 F (37.2 C) (10/11 1247) Pulse Rate:  [53-117] 59 (10/11 1248) Resp:  [16-23] 18 (10/11 1247) BP: (141-178)/(83-111) 158/102 mmHg (10/11 1342) SpO2:  [89 %-100 %] 95 % (10/11 1248)  Intake/Output from previous day: 10/10 0701 - 10/11 0700 In: 1488.9 [P.O.:60; I.V.:1238.9; IV Piggyback:190] Out: 650 [Urine:650] Intake/Output from this shift: Total I/O In: 120 [P.O.:120] Out: 0   Physical Exam: General appearance: cooperative and appears stated age  heart irregular irregular heartbeat systolic murmur along the left sternal border tachycardic at a rate of 120  lung exam clear bilaterally  abdominal exam is distended no masses no significant tenderness  Wound is healing normally  neurological exam grossly intact  skin exam was totally normal  extremity exam within normal limits Lab Results:  Recent Labs  11/01/14 0422 11/02/14 0802  WBC 5.9 5.6  HGB 15.4 15.3  PLT 166 174    Recent Labs  11/01/14 0422 11/02/14 0802  NA 138 139  K 4.0 4.0  CL 102 102  CO2 30 30  GLUCOSE 122* 110*  BUN 13 16  CREATININE 1.43* 1.27*   No results for input(s): TROPONINI in the last 72 hours.  Invalid input(s): CK, MB Hepatic Function Panel  Recent Labs  11/01/14 0422  PROT 6.9  ALBUMIN 3.0*  AST 33  ALT 40  ALKPHOS 84  BILITOT 3.3*   No results for input(s): CHOL in the last 72 hours. No results for input(s): PROTIME in the last 72 hours.  Imaging: Imaging results have been reviewed  Cardiac Studies:  Assessment/Plan:  Arrhythmia Atrial Fibrillation Cardiomyopathy Coronary Artery Disease Palpitations Shortness of Breath   postop from colostomy reversal  GERD  colon cancer  hypertension  tachycardia  PLAN  for add digoxin to help with rate control  consider load with amiodarone for rhythm control  hold off on long-term anticoagulation for now  DVT prophylaxis to be continued  increased Cardizem for rate control  continue hypertension control with hydralazine metoprolol Cardizem  continue Protonix therapy for reflux symptoms  continue medical therapy for atrial fibrillation and tachycardia  the do not recommend cardioversion at this point  consider transfer to ICU for IV Cardizem and IV amiodarone  case discussed with hospitalist  LOS: 6 days    Renuka Farfan D. 11/03/2014, 2:47 PM

## 2014-11-03 NOTE — Progress Notes (Signed)
[facility] [place] NAME: James Keller    MR#:  [id number]  DATE OF BIRTH:  1950/05/15  SUBJECTIVE:  CHIEF COMPLAINT:  No chief complaint on file.  patient here due to laparoscopic colostomy closure. Postoperatively patient noted to be in atrial flutter/fibrillation with RVR. Presently denies any chest pain, shortness of breath. Admits to some abdominal pain. No nausea, vomiting.  REVIEW OF SYSTEMS:    Review of Systems  Constitutional: Negative for fever and chills.  HENT: Negative for congestion and tinnitus.   Eyes: Negative for blurred vision and double vision.  Respiratory: Negative for cough, shortness of breath and wheezing.   Cardiovascular: Negative for chest pain, orthopnea and PND.  Gastrointestinal: Positive for abdominal pain. Negative for nausea, vomiting and diarrhea.  Genitourinary: Negative for dysuria and hematuria.  Neurological: Positive for weakness (generalized). Negative for dizziness, sensory change and focal weakness.  All other systems reviewed and are negative.   Nutrition: Clear liquids Tolerating Diet: Yes Tolerating PT: Yes  DRUG ALLERGIES:   Allergies  Allergen Reactions  . No Known Allergies     VITALS:  Blood pressure 159/85, pulse 93, temperature 99.4 F (37.4 C), temperature source Oral, resp. rate 22, height 6\' 1"  (1.854 m), weight 104.2 kg (229 lb 11.5 oz), SpO2 100 %.  PHYSICAL EXAMINATION:   Physical Exam  GENERAL:  64 y.o.-year-old patient lying in the bed in no acute distress.  EYES: Pupils equal, round, reactive to light and accommodation. No scleral icterus. Extraocular muscles intact.  HEENT: Head atraumatic, normocephalic. Oropharynx and nasopharynx clear.  NECK:  Supple, no jugular venous distention. No thyroid enlargement, no tenderness.  LUNGS: Normal breath sounds bilaterally, no wheezing, rales, rhonchi. No use of accessory muscles of respiration.  CARDIOVASCULAR:  S1, S2 irregular and tachycardic. No murmurs, rubs, or gallops.  ABDOMEN: Soft, tender diffusely, no rebound, rigidity. Slightly diistended. Bowel sounds hypoactive. No organomegaly or mass. Positive midabdominal dressing from recent surgery. EXTREMITIES: No cyanosis, clubbing or edema b/l.    NEUROLOGIC: Cranial nerves II through XII are intact. No focal Motor or sensory deficits b/l.   PSYCHIATRIC: The patient is alert and oriented x 3. Good affect SKIN: No obvious rash, lesion, or ulcer.    LABORATORY PANEL:   CBC  Recent Labs Lab 11/02/14 0802  WBC 5.6  HGB 15.3  HCT 46.5  PLT 174   ------------------------------------------------------------------------------------------------------------------  Chemistries   Recent Labs Lab 11/01/14 0422 11/02/14 0802  NA 138 139  K 4.0 4.0  CL 102 102  CO2 30 30  GLUCOSE 122* 110*  BUN 13 16  CREATININE 1.43* 1.27*  CALCIUM 8.9 9.0  AST 33  --   ALT 40  --   ALKPHOS 84  --   BILITOT 3.3*  --    ------------------------------------------------------------------------------------------------------------------  Cardiac Enzymes  Recent Labs Lab 10/30/14 1937  TROPONINI <0.03   ------------------------------------------------------------------------------------------------------------------  RADIOLOGY:  No results found.   ASSESSMENT AND PLAN:   64 year old male with past history of paroxysmal atrial fibrillation/flutter, hypertension, short of colon cancer status post colostomy, history of previous DVT, tobacco abuse who presented to the hospital for a colostomy closure and noted to be in atrial fibrillation with rapid ventricular response.  #1 atrial fibrillation with RVR-patient's heart rates are still labile. Currently he is not in congestive heart failure or symptomatic presently. -Continue intermittent IV Cardizem, oral Cardizem for rate control. -Discussed with cardiology and the plan to add digoxin and possibly  loading  him with digoxin. -Continue telemetry monitoring and follow heart rate. Patient is hemodynamically stable.  #2 hypertension-continue metoprolol, Cardizem.  #3 status post colostomy closure-continue care as per surgery. Continue empiric antibiotics.  #4 GERD-continue Protonix.  #5 neuropathy-continue gabapentin.  All the records are reviewed and case discussed with Care Management/Social Workerr. Management plans discussed with the patient, family and they are in agreement.  CODE STATUS: Full  DVT Prophylaxis: Lovenox  TOTAL TIME TAKING CARE OF THIS PATIENT: 30 minutes.   POSSIBLE D/C IN 2-3 DAYS, DEPENDING ON CLINICAL CONDITION.   Henreitta Leber M.D on 11/03/2014 at 12:02 PM  Between 7am to 6pm - Pager - [phone]  After 6pm go to [email or web address] - password [email or web address]  [facility]  Office  [phone]  CC: Primary care physician; No primary care provider on file.

## 2014-11-03 NOTE — Consult Note (Signed)
James Keller at [place] NAME: James Keller    MR#:  [id number]  DATE OF BIRTH:  1950/07/28  DATE OF ADMISSION:  10/28/2014  PRIMARY CARE PHYSICIAN: No primary care provider on file.   REQUESTING/REFERRING PHYSICIAN: Lundquist  CHIEF COMPLAINT:  No chief complaint on file.  rapid heart rate  HISTORY OF PRESENT ILLNESS:  James Keller  is a 64 y.o. male with a known history of colon cancer, H of fibrillation chronic who originally presented on 10/28/2014 for routine reversal of ostomy. Underwent procedure without complications on 63/01/6008. However on 10-7 noted to be in atrial relation rapid ventricular response requiring transfer to the intensive care unit E assessment placed on Cardizem drip at 5 mg/h. His heart was well controlled she was transferred out of the intensive. On 11/02/2014. However has contact with house supervisor patient is not H relation rapid ventricular response rate into the 170s despite this he has no further complaints at this time aside from some abdominal pain. Denies any palpitations chest pain shortness breath.  PAST MEDICAL HISTORY:   Past Medical History  Diagnosis Date  . DVT (deep venous thrombosis) (HCC)     RIGHT LEG/ HX OF  . S/P chemotherapy, time since less than 4 weeks   . Hypertension   . 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 DVT (deep venous thrombosis) ([place])   . PAF (paroxysmal atrial fibrillation) ([place])   . Lower extremity edema     Rt leg  . Colon cancer ([place])     a. 11/2013 s/p colon resection/Hartmann's procedure;  b. Chemo: FOLFOX completed 07/2014.  . Tobacco abuse     PAST SURGICAL HISTOIRY:   Past Surgical History  Procedure Laterality Date  . Colon resection  12/19/2013    colostomy  . Leg surgery    . Tunneled venous port placement    . Colonoscopy with propofol N/A 09/14/2014    Procedure: COLONOSCOPY WITH PROPOFOL THROUGH COLOSTOMY AND  COLONOSCOPY THRU RECTUM;  Surgeon: Lucilla Lame, MD;  Location: [facility];  Service: Endoscopy;  Laterality: N/A;  TRANSVERSE COLON POLYP AND SIGMOID COLON POLYP  . Femur fracture surgery Right   . Port a cath injection ([facility] hx) Right   . Colostomy reversal N/A 10/28/2014    Procedure: COLOSTOMY REVERSAL;  Surgeon: Marlyce Huge, MD;  Location: [facility];  Service: General;  Laterality: N/A;  . Lysis of adhesion  10/28/2014    Procedure: LYSIS OF ADHESION;  Surgeon: Marlyce Huge, MD;  Location: [facility];  Service: General;;    SOCIAL HISTORY:   Social History  Substance Use Topics  . Smoking status: Current Some Day Smoker -- 0.50 packs/day for 54 years    Types: Cigarettes  . Smokeless tobacco: Never Used  . Alcohol Use: 3.6 oz/week    6 Cans of beer per week     Comment: moderate beer    FAMILY HISTORY:   Family History  Problem Relation Age of Onset  . Hypertension Mother     DRUG ALLERGIES:   Allergies  Allergen Reactions  . No Known Allergies     REVIEW OF SYSTEMS:  CONSTITUTIONAL: No fever, fatigue or weakness.  EYES: No blurred or double vision.  EARS, NOSE, AND THROAT: No tinnitus or ear pain.  RESPIRATORY: No cough, shortness of breath, wheezing or hemoptysis.  CARDIOVASCULAR: No chest pain, orthopnea, edema.  GASTROINTESTINAL: No nausea, vomiting, diarrhea positive abdominal pain.  GENITOURINARY: No dysuria, hematuria.  ENDOCRINE: No polyuria, nocturia,  HEMATOLOGY: No anemia, easy bruising or bleeding SKIN: No rash or lesion. MUSCULOSKELETAL: No joint pain or arthritis.   NEUROLOGIC: No tingling, numbness, weakness.  PSYCHIATRY: No anxiety or depression.   MEDICATIONS AT HOME:   Prior to Admission medications   Medication Sig Start Date End Date Taking? Authorizing Provider  bisacodyl (BISACODYL) 5 MG EC tablet Take 1 tablet (5 mg total) by mouth once. At 8:00 AM, take 4 Dulcolax tablets (5 mg each). 10/15/14  Yes Marlyce Huge, MD  gabapentin (NEURONTIN) 300 MG capsule Take 1 capsule (300 mg total) by mouth 3 (three) times daily. 09/07/14  Yes Lloyd Huger, MD  metoprolol tartrate (LOPRESSOR) 25 MG tablet Take 25 mg by mouth 2 (two) times daily. Am and pm 12/27/13  Yes Historical Provider, MD  nicotine (NICODERM CQ - DOSED IN MG/24 HOURS) 14 mg/24hr patch Place 14 mg onto the skin as needed.    Yes Historical Provider, MD  polyethylene glycol powder (GLYCOLAX/MIRALAX) powder Take 255 g by mouth once. At 2:00 PM, start Miralax Prep (255 gram bottle). 10/15/14  Yes Marlyce Huge, MD      VITAL SIGNS:  Blood pressure 153/83, pulse 117, temperature 99.4 F (37.4 C), temperature source Oral, resp. rate 16, height 6\' 1"  (1.854 m), weight 229 lb 11.5 oz (104.2 kg), SpO2 89 %.  PHYSICAL EXAMINATION:  GENERAL:  64 y.o.-year-old patient lying in the bed with no acute distress.  EYES: Pupils equal, round, reactive to light and accommodation. No scleral icterus. Extraocular muscles intact.  HEENT: Head atraumatic, normocephalic. Oropharynx and nasopharynx clear.  NECK:  Supple, no jugular venous distention. No thyroid enlargement, no tenderness.  LUNGS: Normal breath sounds bilaterally, no wheezing, rales,rhonchi or crepitation. No use of accessory muscles of respiration.  CARDIOVASCULAR: S1, S2 irregular rate and irregular rhythm. No murmurs, rubs, or gallops.  ABDOMEN: Soft, nontender, nondistended. Bowel sounds present. No organomegaly or mass.  EXTREMITIES: No pedal edema, cyanosis, or clubbing.  NEUROLOGIC: Cranial nerves II through XII are intact. Muscle strength 5/5 in all extremities. Sensation intact. Gait not checked.  PSYCHIATRIC: The patient is alert and oriented x 3.  SKIN: No obvious rash, lesion, or ulcer.   LABORATORY PANEL:   CBC  Recent Labs Lab 11/02/14 0802  WBC 5.6  HGB 15.3  HCT 46.5  PLT 174    ------------------------------------------------------------------------------------------------------------------  Chemistries   Recent Labs Lab 11/01/14 0422 11/02/14 0802  NA 138 139  K 4.0 4.0  CL 102 102  CO2 30 30  GLUCOSE 122* 110*  BUN 13 16  CREATININE 1.43* 1.27*  CALCIUM 8.9 9.0  AST 33  --   ALT 40  --   ALKPHOS 84  --   BILITOT 3.3*  --    ------------------------------------------------------------------------------------------------------------------  Cardiac Enzymes  Recent Labs Lab 10/30/14 1937  TROPONINI <0.03   ------------------------------------------------------------------------------------------------------------------  RADIOLOGY:  No results found.  EKG:   Orders placed or performed during the hospital encounter of 10/28/14  . EKG 12-Lead  . EKG 12-Lead    IMPRESSION AND PLAN:   64 year old African-American gentleman who originally presented on 10/28/2014 for reversal of ostomy now experiencing atrial fibrillation rapid ventricular response.   1. Atrial fibrillation rapid ventricular response: He is well maintained on Cardizem drip at 5 mg per hour in the intensive care unit subtotally transferred out of the intensive care it appears that he received 60 mg SR Cardizem sometime after being taken off the  drip. Given that he was on 5 mg at baseline his total requirements 180 mg Cardizem per day. I had him bolus with 10 mg IV Cardizem heart rate responded currently age of fibrillation in the high 90s will add another dose of oral Cardizem as well as increase Cardizem to 90 mg twice a day. Continue on telemetry currently as heart rate is now controlled does not require transfer to the intensive care unit  2. Post ostomy reversal: Care per primary service 3. Essential hypertension: Continue with Lopressor    All the records are reviewed and case discussed with Consulting provider. Management plans discussed with the patient, family and  they are in agreement.  CODE STATUS: Full  TOTAL TIME TAKING CARE OF THIS PATIENT: 45 minutes.    Jla Reynolds,  Karenann Cai.D on 11/03/2014 at 12:20 AM  Between 7am to 6pm - Pager - [phone]  After 6pm: House Pager: - [phone]  [facility]  Office  [phone]  CC: Primary care Physician: No primary care provider on file.

## 2014-11-03 NOTE — Progress Notes (Signed)
Patient continued to have persistent a. Fib w/ RVR and not improving with bolus Cardizem and also digoxin.  -Discussed with cardiology Dr. Clayborn Bigness. We'll transfer patient to ICU stepdown level of care. Start patient on amiodarone and Cardizem drip.James Keller

## 2014-11-03 NOTE — Progress Notes (Signed)
Dr. Clayborn Bigness in to see patient and ordered metoprolol to be given now

## 2014-11-03 NOTE — Progress Notes (Signed)
Patient's heart rate is 152 upon arrival to unit. He is awake and alert. Oriented him to unit and explained treatment plan

## 2014-11-03 NOTE — Consult Note (Signed)
Reason for Consult: atrial fibrillation rapid ventricular response Referring Physician: general surgery Dr Olevia Bowens is an 64 y.o. male.  HPI:  Patient is seen in follow-up because of atrial fibrillation patient usually sees [facility] as a cardiologist. The patient has had atrial fibrillation in the past was recently postop from colostomy reversal with underlying history of colon cancer. Patient still smokes has renal insufficiency states to be compliant with his medication but now has slightly elevated heart rate with atrial fibrillation. Denies chest pain no significant shortness of breath. Cardiology consultation was recommended  Past Medical History  Diagnosis Date  . DVT (deep venous thrombosis) (HCC)     RIGHT LEG/ HX OF  . S/P chemotherapy, time since less than 4 weeks   . Hypertension   . 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 DVT (deep venous thrombosis) ([place])   . PAF (paroxysmal atrial fibrillation) ([place])   . Lower extremity edema     Rt leg  . Colon cancer ([place])     a. 11/2013 s/p colon resection/Hartmann's procedure;  b. Chemo: FOLFOX completed 07/2014.  . Tobacco abuse     Past Surgical History  Procedure Laterality Date  . Colon resection  12/19/2013    colostomy  . Leg surgery    . Tunneled venous port placement    . Colonoscopy with propofol N/A 09/14/2014    Procedure: COLONOSCOPY WITH PROPOFOL THROUGH COLOSTOMY AND COLONOSCOPY THRU RECTUM;  Surgeon: Lucilla Lame, MD;  Location: [facility];  Service: Endoscopy;  Laterality: N/A;  TRANSVERSE COLON POLYP AND SIGMOID COLON POLYP  . Femur fracture surgery Right   . Port a cath injection ([facility] hx) Right   . Colostomy reversal N/A 10/28/2014    Procedure: COLOSTOMY REVERSAL;  Surgeon: Marlyce Huge, MD;  Location: [facility];  Service: General;  Laterality: N/A;  . Lysis of adhesion  10/28/2014    Procedure: LYSIS OF ADHESION;  Surgeon: Marlyce Huge, MD;   Location: [facility];  Service: General;;    Family History  Problem Relation Age of Onset  . Hypertension Mother     Social History:  reports that he has been smoking Cigarettes.  He has a 27 pack-year smoking history. He has never used smokeless tobacco. He reports that he drinks about 3.6 oz of alcohol per week. He reports that he uses illicit drugs (Marijuana).  Allergies:  Allergies  Allergen Reactions  . No Known Allergies     Medications: I have reviewed the patient's current medications.  Results for orders placed or performed during the hospital encounter of 10/28/14 (from the past 48 hour(s))  CBC     Status: Abnormal   Collection Time: 11/02/14  8:02 AM  Result Value Ref Range   WBC 5.6 3.8 - 10.6 K/uL   RBC 5.40 4.40 - 5.90 MIL/uL   Hemoglobin 15.3 13.0 - 18.0 g/dL   HCT 46.5 40.0 - 52.0 %   MCV 86.1 80.0 - 100.0 fL   MCH 28.3 26.0 - 34.0 pg   MCHC 32.9 32.0 - 36.0 g/dL   RDW 16.0 (H) 11.5 - 14.5 %   Platelets 174 150 - 440 K/uL  Basic metabolic panel     Status: Abnormal   Collection Time: 11/02/14  8:02 AM  Result Value Ref Range   Sodium 139 135 - 145 mmol/L   Potassium 4.0 3.5 - 5.1 mmol/L   Chloride 102 101 - 111 mmol/L   CO2  30 22 - 32 mmol/L   Glucose, Bld 110 (H) 65 - 99 mg/dL   BUN 16 6 - 20 mg/dL   Creatinine, Ser 1.27 (H) 0.61 - 1.24 mg/dL   Calcium 9.0 8.9 - 10.3 mg/dL   GFR calc non Af Amer 58 (L) >60 mL/min   GFR calc Af Amer >60 >60 mL/min    Comment: (NOTE) The eGFR has been calculated using the CKD EPI equation. This calculation has not been validated in all clinical situations. eGFR's persistently <60 mL/min signify possible Chronic Kidney Disease.    Anion gap 7 5 - 15    No results found.  Review of Systems  Constitutional: Positive for malaise/fatigue.  HENT: Positive for congestion.   Eyes: Negative.   Respiratory: Positive for shortness of breath.   Cardiovascular: Positive for palpitations and orthopnea.  Gastrointestinal:  Positive for heartburn, nausea and abdominal pain.  Genitourinary: Negative.   Musculoskeletal: Negative.   Skin: Negative.   Neurological: Positive for weakness.  Endo/Heme/Allergies: Negative.   Psychiatric/Behavioral: Negative.    Blood pressure 145/97, pulse 93, temperature 99.4 F (37.4 C), temperature source Oral, resp. rate 20, height 6' 1"  (1.854 m), weight 104.2 kg (229 lb 11.5 oz), SpO2 100 %. Physical Exam  Constitutional: He is oriented to person, place, and time. He appears well-developed and well-nourished.  HENT:  Head: Normocephalic and atraumatic.  Eyes: Conjunctivae and EOM are normal. Pupils are equal, round, and reactive to light.  Neck: Normal range of motion. Neck supple.  Cardiovascular: S1 normal, S2 normal and normal pulses.  An irregularly irregular rhythm present. Tachycardia present.  PMI is displaced.   Murmur heard.  Systolic murmur is present with a grade of 2/6  Respiratory: Effort normal and breath sounds normal.  GI: Bowel sounds are normal. He exhibits distension. There is tenderness.  Musculoskeletal: Normal range of motion.  Neurological: He is alert and oriented to person, place, and time. He has normal reflexes.  Skin: Skin is warm.  Psychiatric: He has a normal mood and affect.    Assessment/Plan:  atrial fib with rapid ventricular response  hypertension  chronic renal insufficiency  smoking  colon cancer  postop for reversal of a colostomy  continue Protonix for GERD symptoms  DVT prophylaxis . PLAN  agree with telemetry  continue postoperative care  short-term anticoagulation  will consider switching the long-term anticoagulation because of significant [place] score  rate control with diltiazem metoprolol will consider adding digoxin  will defer thoughts of cardioversion chemically or electrically  consider antiarrhythmic amiodarone or sotalol  consider echocardiogram  advised patient refrain from tobacco abuse  continue  conservative cardiac therapy at this point  [place] D. 11/03/2014, 10:10 AM

## 2014-11-03 NOTE — Care Management (Signed)
Spoke with patient for discharge planning. Patient stated that he lives with his mother and does not use any DME. PT is recommending home health and patient is agreeable to this. He did ambulate over 200 feet with minimum assistance. No O2 and patient stated that can afford medications "so Far."  Patient is now on liquid diet and stated that the is passing gas and having BMs. Will need further PT evaluation. Continue to follow.

## 2014-11-03 NOTE — Progress Notes (Signed)
Dr. Verdell Carmine notified of pt HR remaining in the 130 aflutter. Orders given for primary RN to contact Dr. Clayborn Bigness. Dr. Clayborn Bigness paged and awaiting callback.

## 2014-11-04 ENCOUNTER — Inpatient Hospital Stay: Payer: Medicaid Other

## 2014-11-04 LAB — BASIC METABOLIC PANEL
ANION GAP: 9 (ref 5–15)
BUN: 15 mg/dL (ref 6–20)
CHLORIDE: 103 mmol/L (ref 101–111)
CO2: 26 mmol/L (ref 22–32)
Calcium: 8.7 mg/dL — ABNORMAL LOW (ref 8.9–10.3)
Creatinine, Ser: 0.98 mg/dL (ref 0.61–1.24)
GFR calc Af Amer: >60 mL/min (ref >60)
GFR calc non Af Amer: >60 mL/min (ref >60)
GLUCOSE: 131 mg/dL — AB (ref 65–99)
POTASSIUM: 3.1 mmol/L — AB (ref 3.5–5.1)
Sodium: 138 mmol/L (ref 135–145)

## 2014-11-04 LAB — CBC
HEMATOCRIT: 44.1 % (ref 40.0–52.0)
HEMOGLOBIN: 14.4 g/dL (ref 13.0–18.0)
MCH: 28 pg (ref 26.0–34.0)
MCHC: 32.7 g/dL (ref 32.0–36.0)
MCV: 85.4 fL (ref 80.0–100.0)
PLATELETS: 210 10*3/uL (ref 150–440)
RBC: 5.16 MIL/uL (ref 4.40–5.90)
RDW: 15.6 % — ABNORMAL HIGH (ref 11.5–14.5)
WBC: 4 10*3/uL (ref 3.8–10.6)

## 2014-11-04 LAB — GLUCOSE, CAPILLARY
GLUCOSE-CAPILLARY: 99 mg/dL (ref 65–99)
GLUCOSE-CAPILLARY: 108 mg/dL — AB (ref 65–99)
GLUCOSE-CAPILLARY: 109 mg/dL — AB (ref 65–99)
Glucose-Capillary: 112 mg/dL — ABNORMAL HIGH (ref 65–99)

## 2014-11-04 LAB — MAGNESIUM: Magnesium: 2.1 mg/dL (ref 1.7–2.4)

## 2014-11-04 LAB — PHOSPHORUS: Phosphorus: 3.5 mg/dL (ref 2.5–4.6)

## 2014-11-04 MED ORDER — INSULIN ASPART 100 UNIT/ML ~~LOC~~ SOLN
0.0000 [IU] | SUBCUTANEOUS | Status: DC
  Administered 2014-11-05 – 2014-11-06 (×2): 2 [IU] via SUBCUTANEOUS
  Filled 2014-11-04 (×2): qty 2

## 2014-11-04 MED ORDER — KCL IN DEXTROSE-NACL 20-5-0.45 MEQ/L-%-% IV SOLN
INTRAVENOUS | Status: DC
  Administered 2014-11-04: 12:00:00 via INTRAVENOUS
  Filled 2014-11-04: qty 1000

## 2014-11-04 MED ORDER — TRACE MINERALS CR-CU-MN-SE-ZN 10-1000-500-60 MCG/ML IV SOLN
INTRAVENOUS | Status: AC
  Administered 2014-11-04: 18:00:00 via INTRAVENOUS
  Filled 2014-11-04: qty 1080

## 2014-11-04 MED ORDER — KCL IN DEXTROSE-NACL 20-5-0.45 MEQ/L-%-% IV SOLN
INTRAVENOUS | Status: DC
  Filled 2014-11-04: qty 1000

## 2014-11-04 MED ORDER — POTASSIUM CHLORIDE 10 MEQ/100ML IV SOLN
10.0000 meq | INTRAVENOUS | Status: AC
  Administered 2014-11-04 (×4): 10 meq via INTRAVENOUS
  Filled 2014-11-04 (×4): qty 100

## 2014-11-04 NOTE — Progress Notes (Signed)
NG tube placement attempted.  Patient gagging and vomited clear liquid and NG curled in mouth.  Patient asked to wait and try again later. RN will attempt placement after while.

## 2014-11-04 NOTE — Progress Notes (Signed)
Patient ID: James Keller, male   DOB: 1950/10/14, 64 y.o.   MRN: [id number]   Surgery  POD 7  Transferred back to the intensive care unit yesterday with uncontrolled atrial fibrillation. Over the course of the last 24 hours he's had increased abdominal distention but continues to pass gas. Pain is well-controlled. There is been no nausea or vomiting.  Filed Vitals:   11/04/14 0800 11/04/14 0900 11/04/14 0945 11/04/14 1000  BP: 145/95 159/105  146/97  Pulse: 94  91   Temp: 98.6 F (37 C)     TempSrc: Oral     Resp: 19 21  23   Height:      Weight:      SpO2: 94%       PE:  Lungs are clear. Abdomen appears distended. It is tympanitic,  no peritoneal signs are present. Alert and oriented.  Labs  CBC Latest Ref Rng 11/04/2014 11/02/2014 11/01/2014  WBC 3.8 - 10.6 K/uL 4.0 5.6 5.9  Hemoglobin 13.0 - 18.0 g/dL 14.4 15.3 15.4  Hematocrit 40.0 - 52.0 % 44.1 46.5 46.3  Platelets 150 - 440 K/uL 210 174 166   CMP Latest Ref Rng 11/04/2014 11/02/2014 11/01/2014  Glucose 65 - 99 mg/dL 131(H) 110(H) 122(H)  BUN 6 - 20 mg/dL 15 16 13   Creatinine 0.61 - 1.24 mg/dL 0.98 1.27(H) 1.43(H)  Sodium 135 - 145 mmol/L 138 139 138  Potassium 3.5 - 5.1 mmol/L 3.1(L) 4.0 4.0  Chloride 101 - 111 mmol/L 103 102 102  CO2 22 - 32 mmol/L 26 30 30   Calcium 8.9 - 10.3 mg/dL 8.7(L) 9.0 8.9  Total Protein 6.5 - 8.1 g/dL - - 6.9  Total Bilirubin 0.3 - 1.2 mg/dL - - 3.3(H)  Alkaline Phos 38 - 126 U/L - - 84  AST 15 - 41 U/L - - 33  ALT 17 - 63 U/L - - 40    I/O last 3 completed shifts: In: 3166.9 [P.O.:300; I.V.:2476.9; IV Piggyback:390] Out: 700 [Urine:700] Total I/O In: 293.4 [I.V.:193.4; IV Piggyback:100] Out: -     IMP prolonged postoperative ileus. atrial fibrillation. Colon cancer.  Plan  I'll obtain a KUB. He may require nasogastric tube. PICC line and TPN has been ordered.

## 2014-11-04 NOTE — Progress Notes (Signed)
   11/04/14 0915  Clinical Encounter Type  Visited With Patient and family together  Visit Type Follow-up  Consult/Referral To Chaplain  Spiritual Encounters  Spiritual Needs Prayer;Emotional  Chaplain rounded in the unit and offered a compassionate presence. Family requested prayer and I offered it as family was concerned about patient. Chaplain Commodore Bellew A. Mikyle Sox Ext. [phone]

## 2014-11-04 NOTE — Progress Notes (Signed)
Resting at this time. Denies pain at this time. Pain controlled with IV PRN dilauded. No nausea and no vomiting other than when attempting to place NG tube the first attempt. 500cc output from NG tube this shift. NSR-Stach with PVC and PAC per cardiac monitor. Blood pressure has been elevated but improved with last dose of PRN hydralazine. Afebrile. Girlfriend at bedside throughout shift.

## 2014-11-04 NOTE — Progress Notes (Signed)
RN spoke with dr. Volanda Napoleon on phone and made her aware that patient has increasingly worse hiccups.  MD acknowledged and gave no new orders stating "there isnt really any medicine that helps."

## 2014-11-04 NOTE — Progress Notes (Signed)
PARENTERAL NUTRITION CONSULT NOTE - INITIAL  Pharmacy Consult for Electrolyte and Glucose management  Indication: TPN  Allergies  Allergen Reactions  . No Known Allergies     Patient Measurements: Height: 6\' 1"  (185.4 cm) Weight: 229 lb 11.5 oz (104.2 kg) IBW/kg (Calculated) : 79.9 Adjusted Body Weight:  Usual Weight:   Vital Signs: Temp: 98.8 F (37.1 C) (10/12 1213) Temp Source: Oral (10/12 1213) BP: 151/103 mmHg (10/12 1213) Pulse Rate: 88 (10/12 1213) Intake/Output from previous day: 10/11 0701 - 10/12 0700 In: 2361 [P.O.:300; I.V.:1761; IV Piggyback:300] Out: 600 [Urine:600] Intake/Output from this shift: Total I/O In: 336.8 [I.V.:236.8; IV Piggyback:100] Out: -   Labs:  Recent Labs  11/02/14 0802 11/04/14 0500  WBC 5.6 4.0  HGB 15.3 14.4  HCT 46.5 44.1  PLT 174 210     Recent Labs  11/02/14 0802 11/04/14 0500  NA 139 138  K 4.0 3.1*  CL 102 103  CO2 30 26  GLUCOSE 110* 131*  BUN 16 15  CREATININE 1.27* 0.98  CALCIUM 9.0 8.7*  MG  --  2.1  PHOS  --  3.5   Estimated Creatinine Clearance: 97.8 mL/min (by C-G formula based on Cr of 0.98).    Recent Labs  11/04/14 1228  GLUCAP 99    Medical History: Past Medical History  Diagnosis Date  . DVT (deep venous thrombosis) (HCC)     RIGHT LEG/ HX OF  . S/P chemotherapy, time since less than 4 weeks   . Hypertension   . 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 DVT (deep venous thrombosis) ([place])   . PAF (paroxysmal atrial fibrillation) ([place])   . Lower extremity edema     Rt leg  . Colon cancer ([place])     a. 11/2013 s/p colon resection/Hartmann's procedure;  b. Chemo: FOLFOX completed 07/2014.  . Tobacco abuse     Medications:  Scheduled:  . ampicillin-sulbactam (UNASYN) IV  3 g Intravenous Q6H  . digoxin  0.25 mg Oral Daily  . diltiazem  30 mg Oral 4 times per day  . enoxaparin (LOVENOX) injection  40 mg Subcutaneous Q24H  . gabapentin  300 mg Oral TID  .  insulin aspart  0-15 Units Subcutaneous 6 times per day  . metoprolol tartrate  37.5 mg Oral BID  . pantoprazole (PROTONIX) IV  40 mg Intravenous QHS  . potassium chloride  10 mEq Intravenous Q1 Hr x 4   Infusions:  . amiodarone 30 mg/hr (11/04/14 0939)  . dextrose 5 % and 0.45 % NaCl with KCl 20 mEq/L 50 mL/hr at 11/04/14 1216  . diltiazem (CARDIZEM) infusion 5 mg/hr (11/04/14 0941)    Insulin Requirements in the past 24 hours:   Initial   Plan:  Patient currently on Clinimix E5/20 with MVI @ 45 ml/hr. Electrolyte panel ordered with am labs. SSI ordered.     Xayvion Shirah D 11/04/2014,1:05 PM

## 2014-11-04 NOTE — Progress Notes (Signed)
Per [employer], RN([facility]) PICC line is okay for use per chest xray.

## 2014-11-04 NOTE — Progress Notes (Signed)
Nutrition Follow-up     INTERVENTION:   Coordination of Care: abdominal xray pending; awaiting poc. If unable to advance diet in next day or so, recommend consideration of nutrition support as pt has been NPO/minimal CL intake for 7 days Medical Food Supplement Therapy: if diet advanced, recommend addition of EnsureEnlive (pt likes Strawberry)   NUTRITION DIAGNOSIS:   Inadequate oral intake related to acute illness as evidenced by NPO status. Continues  GOAL:   Patient will meet greater than or equal to 90% of their needs  MONITOR:    (Energy Intake, Anthropometrics, Digestive System, Electrolyte/Renal Profile)  REASON FOR ASSESSMENT:   NPO/Clear Liquid Diet    ASSESSMENT:    Pt with afib/rvr, abdominal distention postop with abdominal xray pending this AM  Diet Order:  Diet NPO time specified Except for: Sips with Meds, Ice Chips   Energy Intake: pt had CL tray this AM, only took small amounts; NPO/CL day 7. CL diet order discontinued due to abdominal distention  Food and Nutrition Related History: pt reports prior to admission very good appetite; appetite has been great since stopping chemo in June/July. Pt has not been using nutritional supplements at home recently but has tried Ensure in the past, likes Strawberry  Digestive System: abdominal distention, some abdominal pain/fullness/pressure, no flatus today, reports he did pass gas during the night, no N/V, last BM documented 10/11 in AM  Last BM:    10/11 loose medium green  Electrolyte and Renal Profile:  Recent Labs Lab 11/01/14 0422 11/02/14 0802 11/04/14 0500  BUN 13 16 15   CREATININE 1.43* 1.27* 0.98  NA 138 139 138  K 4.0 4.0 3.1*   Meds: D5-1/2 NS at 50 ml/hr. Amiodarone, cardizem  Height:   Ht Readings from Last 1 Encounters:  10/30/14 6\' 1"  (1.854 m)    Weight: pt reports weight gain since stopping chemo in the summer  Wt Readings from Last 1 Encounters:  10/30/14 229 lb 11.5 oz (104.2  kg)    Filed Weights   10/28/14 0748 10/30/14 0800  Weight: 234 lb (106.142 kg) 229 lb 11.5 oz (104.2 kg)    BMI:  Body mass index is 30.31 kg/(m^2).  Estimated Nutritional Needs:   Kcal:  7116-5790 kcals (BEE 1885, 1.3 AF, 1.1-1.2 IF)   Protein:  114-135 g (1.1-1.3 g/kg)   Fluid:  2600-3120 mL (25-30 ml/kg)   HIGH Care Level  Kerman Passey MS, RD, LDN [phone] Pager

## 2014-11-04 NOTE — Progress Notes (Signed)
Spoke with Dr. Marina Gravel regarding patient's abdominal distention and MD gave order for KUB at bedside and to make patient NPO except ice and meds. Dr. Marina Gravel has seen and assessed patient this morning.

## 2014-11-04 NOTE — Progress Notes (Signed)
[facility] NAME: James Keller    MR#:  [id number]  DATE OF BIRTH:  10-15-1950  SUBJECTIVE:  CHIEF COMPLAINT:  No chief complaint on file.  Patient is status post colostomy closure postoperatively noted to be in atrial flutter/fibrillation. Transferred to the intensive care unit yesterday and started on amiodarone and Cardizem drip. Heart rate much improved. Now noted to have a postoperative ileus and to have an NG tube placed and to be started on TPN.  REVIEW OF SYSTEMS:    Review of Systems  Constitutional: Negative for fever and chills.  HENT: Negative for congestion and tinnitus.   Eyes: Negative for blurred vision and double vision.  Respiratory: Negative for cough, shortness of breath and wheezing.   Cardiovascular: Negative for chest pain, orthopnea and PND.  Gastrointestinal: Positive for abdominal pain. Negative for nausea, vomiting and diarrhea.  Genitourinary: Negative for dysuria and hematuria.  Neurological: Positive for weakness (generalized). Negative for dizziness, sensory change and focal weakness.  All other systems reviewed and are negative.   Nutrition: Clear liquids Tolerating Diet: Yes Tolerating PT: Yes  DRUG ALLERGIES:   Allergies  Allergen Reactions  . No Known Allergies     VITALS:  Blood pressure 174/97, pulse 99, temperature 98.8 F (37.1 C), temperature source Oral, resp. rate 19, height 6\' 1"  (1.854 m), weight 104.2 kg (229 lb 11.5 oz), SpO2 94 %.  PHYSICAL EXAMINATION:   Physical Exam  GENERAL:  64 y.o.-year-old patient lying in the bed in no acute distress.  EYES: Pupils equal, round, reactive to light and accommodation. No scleral icterus. Extraocular muscles intact.  HEENT: Head atraumatic, normocephalic. Oropharynx and nasopharynx clear.  NECK:  Supple, no jugular venous distention. No thyroid enlargement, no tenderness.  LUNGS: Normal breath sounds bilaterally, no  wheezing, rales, rhonchi. No use of accessory muscles of respiration.  CARDIOVASCULAR: S1, S2 irregular. No murmurs, rubs, or gallops.  ABDOMEN: Soft, distended. Hypoactive bowel sounds. No organomegaly. Positive midabdominal dressing from recent surgery. EXTREMITIES: No cyanosis, clubbing or edema b/l.    NEUROLOGIC: Cranial nerves II through XII are intact. No focal Motor or sensory deficits b/l.  Globally weak. PSYCHIATRIC: The patient is alert and oriented x 3. Good affect SKIN: No obvious rash, lesion, or ulcer.    LABORATORY PANEL:   CBC  Recent Labs Lab 11/04/14 0500  WBC 4.0  HGB 14.4  HCT 44.1  PLT 210   ------------------------------------------------------------------------------------------------------------------  Chemistries   Recent Labs Lab 11/01/14 0422  11/04/14 0500  NA 138  < > 138  K 4.0  < > 3.1*  CL 102  < > 103  CO2 30  < > 26  GLUCOSE 122*  < > 131*  BUN 13  < > 15  CREATININE 1.43*  < > 0.98  CALCIUM 8.9  < > 8.7*  MG  --   --  2.1  AST 33  --   --   ALT 40  --   --   ALKPHOS 84  --   --   BILITOT 3.3*  --   --   < > = values in this interval not displayed. ------------------------------------------------------------------------------------------------------------------  Cardiac Enzymes  Recent Labs Lab 10/30/14 1937  TROPONINI <0.03   ------------------------------------------------------------------------------------------------------------------  RADIOLOGY:  Dg Abd 1 View  11/04/2014  CLINICAL DATA:  Abdominal distension, history of colon cancer EXAM: ABDOMEN - 1 VIEW COMPARISON:  10/31/2014 FINDINGS: Midline surgical staples noted. There is diffuse gaseous distention  of small and large bowel. Large bowel is distended to a diameter of 11 cm. It is cecum, ascending colon, and proximal transverse colon that are dilated. There is gas in the rectum. IMPRESSION: Increased gaseous distention of small and large bowel. New the findings  could again represent postoperative ileus. Possibility of large bowel obstruction not excluded. Electronically Signed   By: Skipper Cliche M.D.   On: 11/04/2014 10:43     ASSESSMENT AND PLAN:   64 year old male with past history of paroxysmal atrial fibrillation/flutter, hypertension, short of colon cancer status post colostomy, history of previous DVT, tobacco abuse who presented to the hospital for a colostomy closure and noted to be in atrial fibrillation with rapid ventricular response.  #1 atrial fibrillation with RVR-heart rates were quite labile yesterday and therefore we'll transfer to the intensive care unit and started on amiodarone and Cardizem drip. Appreciate cardiology help. -Heart rate has significantly improved. Would like to transition patient to oral medications but he is nothing by mouth given his postoperative ileus.  #2 hypertension-continue metoprolol, Cardizem.  #3 status post colostomy closure-now and noted to have a postoperative ileus. -Patient to have NG tube placed for decompression and to be started on TPN -Continue care as per general surgery.  #4 GERD-continue Protonix.  #5 neuropathy-continue gabapentin.  6 hypokalemia-we'll replace potassium accordingly and follow level.  All the records are reviewed and case discussed with Care Management/Social Workerr. Management plans discussed with the patient, family and they are in agreement.  CODE STATUS: Full  DVT Prophylaxis: Lovenox  TOTAL TIME TAKING CARE OF THIS PATIENT: 30 minutes.   POSSIBLE D/C IN 2-3 DAYS, DEPENDING ON CLINICAL CONDITION.   Henreitta Leber M.D on 11/04/2014 at 4:03 PM  Between 7am to 6pm - Pager - [phone]  After 6pm go to [email or web address] - password [email or web address] [facility]  [facility]  Office  [phone]  CC: Primary care physician; No primary care provider on file.

## 2014-11-04 NOTE — Progress Notes (Signed)
Nutrition Follow-up    INTERVENTION:  PN: Consulted to start TPN, recommend 5% AA/20% dextrose at goal rate of 31ml/hr (limited to 2 liter bag per pharmacy) .  Provides 100 g of protein, 1354 kcals from dextrose. Recommend lipids 20% 2 times per week providing additional 285 kcals/d.  Total kcals to provide 2039  kcals/d. Spoke with Dr. Marina Gravel regarding recommendations and agreed.  MD wanting IV fluids to stop when TPN started.  Pharmacy aware and following and spoke with [facility] RN. Recommend checking triglycerides in am in addition to already ordered labs.   NUTRITION DIAGNOSIS:   Inadequate oral intake related to acute illness as evidenced by NPO status being addressed with starting TPN    GOAL:   Patient will meet greater than or equal to 90% of their needs    MONITOR:    (Energy Intake, Anthropometrics, Digestive System, Electrolyte/Renal Profile)  REASON FOR ASSESSMENT:   NPO/Clear Liquid Diet    ASSESSMENT:      Dr. Juliette Mangle note reviewed  See RD's noted from previously today.    Diet Order:  Diet NPO time specified Except for: Sips with Meds, Ice Chips .TPN (CLINIMIX-E) Adult    Height:   Ht Readings from Last 1 Encounters:  10/30/14 6\' 1"  (1.854 m)    Weight:   Wt Readings from Last 1 Encounters:  10/30/14 229 lb 11.5 oz (104.2 kg)    Ideal Body Weight:     BMI:  Body mass index is 30.31 kg/(m^2).  Estimated Nutritional Needs:   Kcal:  0630-1601 kcals (BEE 1885, 1.3 AF, 1.1-1.2 IF)   Protein:  114-135 g (1.1-1.3 g/kg)   Fluid:  2600-3120 mL (25-30 ml/kg)   EDUCATION NEEDS:   No education needs identified at this time  [place]. Zenia Resides, [place], [place] (pager)

## 2014-11-05 ENCOUNTER — Inpatient Hospital Stay: Payer: Medicaid Other

## 2014-11-05 LAB — CBC WITH DIFFERENTIAL/PLATELET
BASOS PCT: 1 %
Basophils Absolute: 0 10*3/uL (ref 0–0.1)
Eosinophils Absolute: 0 10*3/uL (ref 0–0.7)
Eosinophils Relative: 1 %
HEMATOCRIT: 45.7 % (ref 40.0–52.0)
HEMOGLOBIN: 14.8 g/dL (ref 13.0–18.0)
Lymphocytes Relative: 14 %
Lymphs Abs: 0.7 10*3/uL — ABNORMAL LOW (ref 1.0–3.6)
MCH: 28.1 pg (ref 26.0–34.0)
MCHC: 32.4 g/dL (ref 32.0–36.0)
MCV: 86.5 fL (ref 80.0–100.0)
MONOS PCT: 9 %
Monocytes Absolute: 0.5 10*3/uL (ref 0.2–1.0)
NEUTROS ABS: 3.9 10*3/uL (ref 1.4–6.5)
NEUTROS PCT: 75 %
Platelets: 214 10*3/uL (ref 150–440)
RBC: 5.28 MIL/uL (ref 4.40–5.90)
RDW: 15.9 % — ABNORMAL HIGH (ref 11.5–14.5)
WBC: 5.2 10*3/uL (ref 3.8–10.6)

## 2014-11-05 LAB — GLUCOSE, CAPILLARY
GLUCOSE-CAPILLARY: 135 mg/dL — AB (ref 65–99)
GLUCOSE-CAPILLARY: 105 mg/dL — AB (ref 65–99)
GLUCOSE-CAPILLARY: 118 mg/dL — AB (ref 65–99)
GLUCOSE-CAPILLARY: 111 mg/dL — AB (ref 65–99)
Glucose-Capillary: 113 mg/dL — ABNORMAL HIGH (ref 65–99)

## 2014-11-05 LAB — TRIGLYCERIDES: TRIGLYCERIDES: 125 mg/dL (ref <150)

## 2014-11-05 LAB — BASIC METABOLIC PANEL
ANION GAP: 7 (ref 5–15)
ANION GAP: 10 (ref 5–15)
BUN: 14 mg/dL (ref 6–20)
BUN: 15 mg/dL (ref 6–20)
CALCIUM: 8.9 mg/dL (ref 8.9–10.3)
CHLORIDE: 102 mmol/L (ref 101–111)
CO2: 28 mmol/L (ref 22–32)
CO2: 25 mmol/L (ref 22–32)
CREATININE: 1.04 mg/dL (ref 0.61–1.24)
Calcium: 9 mg/dL (ref 8.9–10.3)
Chloride: 99 mmol/L — ABNORMAL LOW (ref 101–111)
Creatinine, Ser: 1.04 mg/dL (ref 0.61–1.24)
GFR calc Af Amer: >60 mL/min (ref >60)
GFR calc non Af Amer: >60 mL/min (ref >60)
GLUCOSE: 133 mg/dL — AB (ref 65–99)
Glucose, Bld: 129 mg/dL — ABNORMAL HIGH (ref 65–99)
POTASSIUM: 3.2 mmol/L — AB (ref 3.5–5.1)
POTASSIUM: 3.5 mmol/L (ref 3.5–5.1)
SODIUM: 134 mmol/L — AB (ref 135–145)
SODIUM: 137 mmol/L (ref 135–145)

## 2014-11-05 LAB — PHOSPHORUS: PHOSPHORUS: 3.8 mg/dL (ref 2.5–4.6)

## 2014-11-05 LAB — MAGNESIUM: Magnesium: 2 mg/dL (ref 1.7–2.4)

## 2014-11-05 MED ORDER — HYDRALAZINE HCL 20 MG/ML IJ SOLN
10.0000 mg | INTRAMUSCULAR | Status: DC | PRN
  Administered 2014-11-05: 20 mg via INTRAVENOUS
  Filled 2014-11-05 (×2): qty 1

## 2014-11-05 MED ORDER — TRACE MINERALS CR-CU-MN-SE-ZN 10-1000-500-60 MCG/ML IV SOLN
INTRAVENOUS | Status: AC
  Administered 2014-11-05: 19:00:00 via INTRAVENOUS
  Filled 2014-11-05: qty 1992

## 2014-11-05 MED ORDER — SODIUM CHLORIDE 0.9 % IV SOLN: 12.5000 mg | Freq: Four times a day (QID) | INTRAVENOUS | Status: DC | PRN

## 2014-11-05 NOTE — Care Management Note (Signed)
Case Management Note  Patient Details  Name: James Keller MRN: [id number] Date of Birth: 05-Jun-1950  Subjective/Objective:  POD 8 s/p lysis of adhesion and closure of colostomy. Developed a post op ileus with NG placed for abdominal distention. Transferred to ICU 10/12 after developing Afib. On Amioderone and Cardizem gtt.                   Action/Plan:   Expected Discharge Date:                  Expected Discharge Plan:     In-House Referral:     Discharge planning Services     Post Acute Care Choice:    Choice offered to:     DME Arranged:    DME Agency:     HH Arranged:    HH Agency:     Status of Service:  In process, will continue to follow  Medicare Important Message Given:    Date Medicare IM Given:    Medicare IM give by:    Date Additional Medicare IM Given:    Additional Medicare Important Message give by:     If discussed at [place] of Stay Meetings, dates discussed:    Additional Comments:  Jolly Mango, RN 11/05/2014, 2:56 PM

## 2014-11-05 NOTE — Progress Notes (Signed)
Subjective:  Patient states to be done reasonably well. No chest pain still has abdominal bloating denies palpitations or tachycardia  Objective:  Vital Signs in the last 24 hours: Temp:  [98 F (36.7 C)-98.9 F (37.2 C)] 98 F (36.7 C) (10/13 0746) Pulse Rate:  [84-114] 92 (10/13 1000) Resp:  [18-40] 19 (10/13 1000) BP: (143-184)/(59-118) 157/101 mmHg (10/13 1000) SpO2:  [92 %-95 %] 93 % (10/13 1000) Weight:  [104.3 kg (229 lb 15 oz)-104.4 kg (230 lb 2.6 oz)] 104.4 kg (230 lb 2.6 oz) (10/13 0435)  Intake/Output from previous day: 10/12 0701 - 10/13 0700 In: 2057.4 [I.V.:627.4; IV Piggyback:800; TPN:630] Out: 2400 [Urine:1100; Emesis/NG output:1300] Intake/Output from this shift: Total I/O In: 300.1 [I.V.:65.1; IV Piggyback:100; TPN:135] Out: -   Physical Exam: General appearance: cooperative and appears stated age Neck: no adenopathy, no carotid bruit, no JVD, supple, symmetrical, trachea midline and thyroid not enlarged, symmetric, no tenderness/mass/nodules Lungs: clear to auscultation bilaterally Heart: regular rate and rhythm, S1, S2 normal, no murmur, click, rub or gallop Abdomen: abnormal findings:  distended, hypoactive bowel sounds and obese Extremities: extremities normal, atraumatic, no cyanosis or edema Pulses: 2+ and symmetric Skin: Skin color, texture, turgor normal. No rashes or lesions Neurologic: Alert and oriented X 3, normal strength and tone. Normal symmetric reflexes. Normal coordination and gait  Lab Results:  Recent Labs  11/04/14 0500 11/05/14 0500  WBC 4.0 5.2  HGB 14.4 14.8  PLT 210 214    Recent Labs  11/04/14 2130 11/05/14 0500  NA 134* 137  K 3.2* 3.5  CL 99* 102  CO2 28 25  GLUCOSE 133* 129*  BUN 14 15  CREATININE 1.04 1.04   No results for input(s): TROPONINI in the last 72 hours.  Invalid input(s): CK, MB Hepatic Function Panel No results for input(s): PROT, ALBUMIN, AST, ALT, ALKPHOS, BILITOT, BILIDIR, IBILI in the last 72  hours. No results for input(s): CHOL in the last 72 hours. No results for input(s): PROTIME in the last 72 hours.  Imaging: Imaging results have been reviewed  Cardiac Studies:  Assessment/Plan:  Atrial Fibrillation Chest Pain Edema Palpitations  Hypertension Postop revision of colostomy Abdominal distention Bradycardia History of colon cancer diabetes . PLAN Patient still intensive care unit with NG tube to suction Continue postoperative care Agree with amiodarone for atrial fibrillation Continue digoxin and metoprolol diltiazem for rate control Agree with insulin therapy for diabetes Hypertension control with diltiazem and metoprolol DVT prophylaxis Agree with postoperative management  LOS: 8 days    James Keller D. 11/05/2014, 10:57 AM

## 2014-11-05 NOTE — Progress Notes (Signed)
Rested on and off during shift. A&Ox4.  Pain controlled well with dilaudid PRN. NSR-ST with PACs and PVCs.  Afebrile. BM this afternoon and abdomen is not as distended nor as taut as earlier in shift. 900cc output from NG tube. No nausea or vomiting this today.  Dressing changed this afternoon and is clean, dry and intact. Girlfriend visited this evening. Call bell in reach with no distress noted.

## 2014-11-05 NOTE — Progress Notes (Signed)
Nutrition Follow-up     INTERVENTION:   PN: recommend increasing 5%AA/20%Dextrose TPN to rate of 83 ml/hr today; pharmacy following electrolytes, potassium being supplemented, will follow sodium. TG wdl, plan for 20% lipids 2 times per week.    NUTRITION DIAGNOSIS:   Inadequate oral intake related to acute illness as evidenced by NPO status.  GOAL:   Patient will meet greater than or equal to 90% of their needs  MONITOR:    (Energy Intake, Anthropometrics, Digestive System, Electrolyte/Renal Profile)  REASON FOR ASSESSMENT:   NPO/Clear Liquid Diet    ASSESSMENT:    Pt with probable postop ileus vs anastamotic inflammation; NG in place, TPN infusing via PICC line that was placed yesterday   Diet Order:  Diet NPO time specified Except for: Sips with Meds, Ice Chips .TPN (CLINIMIX-E) Adult .TPN (CLINIMIX-E) Adult  Digestive System: NG in place with bilious output, 1300 mL in 24 hours, +flatus  Urine Volume: documented UOP 1100 mL in 24 hours  Electrolyte and Renal Profile:  Recent Labs Lab 11/04/14 0500 11/04/14 2130 11/05/14 0500  BUN 15 14 15   CREATININE 0.98 1.04 1.04  NA 138 134* 137  K 3.1* 3.2* 3.5  MG 2.1 2.0  --   PHOS 3.5 3.8  --    Glucose Profile:  Recent Labs  11/04/14 2353 11/05/14 0352 11/05/14 0714  GLUCAP 112* 135* 113*   Lipid Profile:     Component Value Date/Time   TRIG 125 11/05/2014 0415   Meds: ss novolog, potassium chloride  Height:   Ht Readings from Last 1 Encounters:  10/30/14 6\' 1"  (1.854 m)    Weight:   Wt Readings from Last 1 Encounters:  11/05/14 230 lb 2.6 oz (104.4 kg)   Filed Weights   10/30/14 0800 11/04/14 1730 11/05/14 0435  Weight: 229 lb 11.5 oz (104.2 kg) 229 lb 15 oz (104.3 kg) 230 lb 2.6 oz (104.4 kg)    BMI:  Body mass index is 30.37 kg/(m^2).  Estimated Nutritional Needs:   Kcal:  8811-0315 kcals (BEE 1885, 1.3 AF, 1.1-1.2 IF)   Protein:  114-135 g (1.1-1.3 g/kg)   Fluid:  2600-3120 mL  (25-30 ml/kg)   EDUCATION NEEDS:   No education needs identified at this time  [place], RD, LDN [phone] Pager

## 2014-11-05 NOTE — Progress Notes (Signed)
Patient ID: James Keller, male   DOB: 1950/06/09, 64 y.o.   MRN: [id number]   Surgery  POD 8   S/P lysis of adhesions and closure of colostomy  The patient continues to have some abdominal distention but denies any significant pain. He is continuing to pass gas. He was remained on the amiodarone and diltiazem drips per cardiology. Nasogastric tube was placed yesterday following an x-ray demonstrating distention of small and large bowel.  Filed Vitals:   11/05/14 0700 11/05/14 0730 11/05/14 0746 11/05/14 0800  BP: 155/105 157/91  167/99  Pulse: 96 90  87  Temp:   98 F (36.7 C)   TempSrc:   Oral   Resp: 18 23  21   Height:      Weight:      SpO2: 95% 94%  93%    PE: The patient is alert and oriented. Currently he is in normal sinus rhythm with frequent premature ventricular contractions. His abdomen is much softer than it was yesterday however remains distended and somewhat tympanitic. Wound is granulating. Dressing is intact.  Labs  CBC Latest Ref Rng 11/04/2014 11/02/2014 11/01/2014  WBC 3.8 - 10.6 K/uL 4.0 5.6 5.9  Hemoglobin 13.0 - 18.0 g/dL 14.4 15.3 15.4  Hematocrit 40.0 - 52.0 % 44.1 46.5 46.3  Platelets 150 - 440 K/uL 210 174 166   CMP Latest Ref Rng 11/04/2014 11/04/2014 11/02/2014  Glucose 65 - 99 mg/dL 133(H) 131(H) 110(H)  BUN 6 - 20 mg/dL 14 15 16   Creatinine 0.61 - 1.24 mg/dL 1.04 0.98 1.27(H)  Sodium 135 - 145 mmol/L 134(L) 138 139  Potassium 3.5 - 5.1 mmol/L 3.2(L) 3.1(L) 4.0  Chloride 101 - 111 mmol/L 99(L) 103 102  CO2 22 - 32 mmol/L 28 26 30   Calcium 8.9 - 10.3 mg/dL 8.9 8.7(L) 9.0  Total Protein 6.5 - 8.1 g/dL - - -  Total Bilirubin 0.3 - 1.2 mg/dL - - -  Alkaline Phos 38 - 126 U/L - - -  AST 15 - 41 U/L - - -  ALT 17 - 63 U/L - - -    I/O last 3 completed shifts: In: 3291 [P.O.:180; I.V.:1481; IV Piggyback:1000] Out: 2875 [Urine:1575; Emesis/NG output:1300] Total I/O In: 66.7 [I.V.:21.7; TPN:45] Out: -     IMP  postoperative ileus. A  PICC line and TPN was started yesterday. We will need to follow up on his potassium level from this morning and aggressively repleted.   Plan continue nasogastric tube nothing by mouth TPN. Rate management of his heart has per cardiology and medicine. Potassium and electrolytes replacement.

## 2014-11-05 NOTE — Progress Notes (Signed)
Surgery Progress note  S:  Feels better.  NG 1300 overnight.  + flatus O:Blood pressure 143/100, pulse 101, temperature 98 F (36.7 C), temperature source Oral, resp. rate 19, height 6\' 1"  (1.854 m), weight 230 lb 2.6 oz (104.4 kg), SpO2 95 %. GEN: NAD/A&Ox3 ABD: soft, min tender, moderate distended, incision c/d/i  A/P 64 yo s/p ostomy reversal, + probable ileus vs anastamotic inflammation - NG - KUB - TPN

## 2014-11-05 NOTE — Progress Notes (Signed)
[facility] NAME: James Keller    MR#:  [id number]  DATE OF BIRTH:  [birth year]  SUBJECTIVE:  CHIEF COMPLAINT:  No chief complaint on file.  Patient is status post colostomy closure postoperatively noted to be in atrial flutter/fibrillation. Transferred to the intensive care unit yesterday and started on amiodarone and Cardizem drip. Heart rate much improved. Now noted to have a postoperative ileus and to have an NG tube placed and to be started on TPN.  REVIEW OF SYSTEMS:    Review of Systems  Constitutional: Negative for fever and chills.  HENT: Negative for congestion and tinnitus.   Eyes: Negative for blurred vision and double vision.  Respiratory: Negative for cough, shortness of breath and wheezing.   Cardiovascular: Negative for chest pain, orthopnea and PND.  Gastrointestinal: Positive for abdominal pain. Negative for nausea, vomiting and diarrhea.  Genitourinary: Negative for dysuria and hematuria.  Neurological: Positive for weakness (generalized). Negative for dizziness, sensory change and focal weakness.  All other systems reviewed and are negative.   Nutrition: NPO Tolerating Diet: No due to ileus.   Tolerating PT: Yes  DRUG ALLERGIES:   Allergies  Allergen Reactions  . No Known Allergies     VITALS:  Blood pressure 151/96, pulse 88, temperature 98 F (36.7 C), temperature source Oral, resp. rate 24, height 6\' 1"  (1.854 m), weight 104.4 kg (230 lb 2.6 oz), SpO2 93 %.  PHYSICAL EXAMINATION:   Physical Exam  GENERAL:  64 y.o.-year-old patient lying in the bed in no acute distress.  EYES: Pupils equal, round, reactive to light and accommodation. No scleral icterus. Extraocular muscles intact.  HEENT: Head atraumatic, normocephalic. Oropharynx and nasopharynx clear.  NECK:  Supple, no jugular venous distention. No thyroid enlargement, no tenderness.  LUNGS: Normal breath sounds bilaterally, no  wheezing, rales, rhonchi. No use of accessory muscles of respiration.  CARDIOVASCULAR: S1, S2 irregular. No murmurs, rubs, or gallops.  ABDOMEN: Soft, distended. Hypoactive bowel sounds. No organomegaly. Positive midabdominal dressing from recent surgery with no acute drainage. EXTREMITIES: No cyanosis, clubbing, + 1 peripheral edema b/l.   NEUROLOGIC: Cranial nerves II through XII are intact. No focal Motor or sensory deficits b/l.  Globally weak. PSYCHIATRIC: The patient is alert and oriented x 3. Good affect SKIN: No obvious rash, lesion, or ulcer.    LABORATORY PANEL:   CBC  Recent Labs Lab 11/05/14 0500  WBC 5.2  HGB 14.8  HCT 45.7  PLT 214   ------------------------------------------------------------------------------------------------------------------  Chemistries   Recent Labs Lab 11/01/14 0422  11/04/14 2130 11/05/14 0500  NA 138  < > 134* 137  K 4.0  < > 3.2* 3.5  CL 102  < > 99* 102  CO2 30  < > 28 25  GLUCOSE 122*  < > 133* 129*  BUN 13  < > 14 15  CREATININE 1.43*  < > 1.04 1.04  CALCIUM 8.9  < > 8.9 9.0  MG  --   < > 2.0  --   AST 33  --   --   --   ALT 40  --   --   --   ALKPHOS 84  --   --   --   BILITOT 3.3*  --   --   --   < > = values in this interval not displayed. ------------------------------------------------------------------------------------------------------------------  Cardiac Enzymes  Recent Labs Lab 10/30/14 1937  TROPONINI <0.03   ------------------------------------------------------------------------------------------------------------------  RADIOLOGY:  Dg Abd 1 View  11/05/2014  CLINICAL DATA:  Ileus. EXAM: ABDOMEN - 1 VIEW COMPARISON:  11/04/2014. FINDINGS: NG tube noted in stable position. Persistent dilated loops of small and large bowel noted consistent adynamic ileus. Minimal improvement. No free air. Surgical staples over the abdomen. ORIF right hip. IMPRESSION: 1. NG tube in stable position. 2. Persistent dilated  loops of small and large bowel consistent with adynamic ileus. Minimal improvement. No free air. Electronically Signed   By: Marcello Moores  Register   On: 11/05/2014 10:31   Dg Abd 1 View  11/04/2014  CLINICAL DATA:  Nasogastric tube placement EXAM: ABDOMEN - 1 VIEW COMPARISON:  Study obtained earlier in the day FINDINGS: Nasogastric tube tip and side port are in the stomach. Generalized bowel dilatation is again noted. Skin staples are noted just to the left of midline and overlying the left mid abdominal region. There is mild bibasilar atelectasis. IMPRESSION: Nasogastric tube tip and side port in stomach. Bowel gas pattern most consistent with a degree of postoperative ileus. Electronically Signed   By: Lowella Grip III M.D.   On: 11/04/2014 17:19   Dg Abd 1 View  11/04/2014  CLINICAL DATA:  Abdominal distension, history of colon cancer EXAM: ABDOMEN - 1 VIEW COMPARISON:  10/31/2014 FINDINGS: Midline surgical staples noted. There is diffuse gaseous distention of small and large bowel. Large bowel is distended to a diameter of 11 cm. It is cecum, ascending colon, and proximal transverse colon that are dilated. There is gas in the rectum. IMPRESSION: Increased gaseous distention of small and large bowel. New the findings could again represent postoperative ileus. Possibility of large bowel obstruction not excluded. Electronically Signed   By: Skipper Cliche M.D.   On: 11/04/2014 10:43   Dg Chest Port 1 View  11/04/2014  CLINICAL DATA:  PICC line placement EXAM: PORTABLE CHEST 1 VIEW COMPARISON:  10/21/2014 FINDINGS: Right Port-A-Cath remains in place, unchanged. Interval placement of left PICC line with the tip in the SVC. Very low lung volumes with bibasilar opacities, likely atelectasis. No definite effusions. No acute bony abnormality. Heart size is accentuated by the low volumes, likely within normal limits. IMPRESSION: Low lung volumes, bibasilar atelectasis. Left PICC line tip in the SVC.  Electronically Signed   By: Rolm Baptise M.D.   On: 11/04/2014 17:19     ASSESSMENT AND PLAN:   64 year old male with past history of paroxysmal atrial fibrillation/flutter, hypertension, short of colon cancer status post colostomy, history of previous DVT, tobacco abuse who presented to the hospital for a colostomy closure and noted to be in atrial fibrillation with rapid ventricular response.  #1 atrial fibrillation with RVR-converted to NSR now.  HR stable and hemodynamically stable.  - will d/c Cadizem gtt and cont. Amio gtt.  Appreciate Cardiology consult.  - cont. Oral Cardizem, Metoprolol, Digoxin.    #2 hypertension-continue metoprolol, Cardizem. - cont. PRN Hydralazine.   #3 status post colostomy closure-still has post-operative ileus.  - s/p NG tube placement and cont. Supportive care as per Surgery.  - cont. TPN.  Repeat KUB today still showing signs of adynamic ileus.   #4 GERD-continue Protonix.  #5 neuropathy-continue gabapentin.  6 hypokalemia-improved w/ supplementation and will monitor.   All the records are reviewed and case discussed with Care Management/Social Workerr. Management plans discussed with the patient, family and they are in agreement.  CODE STATUS: Full  DVT Prophylaxis: Lovenox  TOTAL TIME TAKING CARE OF THIS PATIENT: 30 minutes.   POSSIBLE D/C IN  2-3 DAYS, DEPENDING ON CLINICAL CONDITION.   Henreitta Leber M.D on 11/05/2014 at 11:19 AM  Between 7am to 6pm - Pager - [phone]  After 6pm go to [email or web address] - password [email or web address] [facility]  [facility]  Office  [phone]  CC: Primary care physician; No primary care provider on file.

## 2014-11-06 LAB — CBC
HCT: 41.8 % (ref 40.0–52.0)
HEMOGLOBIN: 14 g/dL (ref 13.0–18.0)
MCH: 28.8 pg (ref 26.0–34.0)
MCHC: 33.4 g/dL (ref 32.0–36.0)
MCV: 86.1 fL (ref 80.0–100.0)
Platelets: 179 10*3/uL (ref 150–440)
RBC: 4.85 MIL/uL (ref 4.40–5.90)
RDW: 15.5 % — ABNORMAL HIGH (ref 11.5–14.5)
WBC: 5.8 10*3/uL (ref 3.8–10.6)

## 2014-11-06 LAB — PHOSPHORUS: Phosphorus: 3.6 mg/dL (ref 2.5–4.6)

## 2014-11-06 LAB — BASIC METABOLIC PANEL
Anion gap: 6 (ref 5–15)
BUN: 17 mg/dL (ref 6–20)
CHLORIDE: 103 mmol/L (ref 101–111)
CO2: 30 mmol/L (ref 22–32)
CREATININE: 0.96 mg/dL (ref 0.61–1.24)
Calcium: 8.8 mg/dL — ABNORMAL LOW (ref 8.9–10.3)
GFR calc Af Amer: >60 mL/min (ref >60)
GFR calc non Af Amer: >60 mL/min (ref >60)
GLUCOSE: 127 mg/dL — AB (ref 65–99)
POTASSIUM: 3 mmol/L — AB (ref 3.5–5.1)
Sodium: 139 mmol/L (ref 135–145)

## 2014-11-06 LAB — GLUCOSE, CAPILLARY
GLUCOSE-CAPILLARY: 107 mg/dL — AB (ref 65–99)
GLUCOSE-CAPILLARY: 119 mg/dL — AB (ref 65–99)
GLUCOSE-CAPILLARY: 116 mg/dL — AB (ref 65–99)
Glucose-Capillary: 108 mg/dL — ABNORMAL HIGH (ref 65–99)
Glucose-Capillary: 112 mg/dL — ABNORMAL HIGH (ref 65–99)
Glucose-Capillary: 114 mg/dL — ABNORMAL HIGH (ref 65–99)
Glucose-Capillary: 117 mg/dL — ABNORMAL HIGH (ref 65–99)
Glucose-Capillary: 128 mg/dL — ABNORMAL HIGH (ref 65–99)

## 2014-11-06 LAB — MAGNESIUM: Magnesium: 2.2 mg/dL (ref 1.7–2.4)

## 2014-11-06 LAB — POTASSIUM: Potassium: 5.1 mmol/L (ref 3.5–5.1)

## 2014-11-06 MED ORDER — AMIODARONE HCL 200 MG PO TABS
200.0000 mg | ORAL_TABLET | Freq: Every day | ORAL | Status: DC
  Administered 2014-11-06: 200 mg via ORAL
  Filled 2014-11-06: qty 1

## 2014-11-06 MED ORDER — POTASSIUM CHLORIDE 10 MEQ/50ML IV SOLN
10.0000 meq | INTRAVENOUS | Status: AC
  Administered 2014-11-06 (×4): 10 meq via INTRAVENOUS
  Filled 2014-11-06 (×4): qty 50

## 2014-11-06 MED ORDER — TRACE MINERALS CR-CU-MN-SE-ZN 10-1000-500-60 MCG/ML IV SOLN
INTRAVENOUS | Status: AC
  Administered 2014-11-06: 17:00:00 via INTRAVENOUS
  Filled 2014-11-06: qty 1992

## 2014-11-06 NOTE — Progress Notes (Signed)
Nutrition Follow-up    INTERVENTION:  PN: Continue TPN at 57ml/hr to better meet nutritional needs.  Will start lipids next week times 2   NUTRITION DIAGNOSIS:   Inadequate oral intake related to acute illness as evidenced by NPO status, nutrition being addressed with tPN    GOAL:   Patient will meet greater than or equal to 90% of their needs    MONITOR:    (Energy Intake, Anthropometrics, Digestive System, Electrolyte/Renal Profile)  REASON FOR ASSESSMENT:   NPO/Clear Liquid Diet    ASSESSMENT:      Current Nutrition: Pt tolerating TPN of 5% AA/20% dextrose via PICC line at 61ml/hr   Gastrointestinal Profile: + gas, BMs abdomen less distended per RN, notes, NG tube to gravity drainage (920ml output noted in last 24 hr) Last BM: 10/14   Medications: KCL  Electrolyte/Renal Profile and Glucose Profile:   Recent Labs Lab 11/04/14 0500 11/04/14 2130 11/05/14 0500 11/06/14 0525  NA 138 134* 137 139  K 3.1* 3.2* 3.5 3.0*  CL 103 99* 102 103  CO2 26 28 25 30   BUN 15 14 15 17   CREATININE 0.98 1.04 1.04 0.96  CALCIUM 8.7* 8.9 9.0 8.8*  MG 2.1 2.0  --  2.2  PHOS 3.5 3.8  --  3.6  GLUCOSE 131* 133* 129* 127*   Protein Profile:  Recent Labs Lab 11/01/14 0422  ALBUMIN 3.0*      Weight Trend since Admission: Filed Weights   11/04/14 1730 11/05/14 0435 11/06/14 0426  Weight: 229 lb 15 oz (104.3 kg) 230 lb 2.6 oz (104.4 kg) 229 lb 11.5 oz (104.2 kg)      Diet Order:  Diet NPO time specified Except for: Sips with Meds, Ice Chips .TPN (CLINIMIX-E) Adult .TPN (CLINIMIX-E) Adult  Skin:   reviewed   Height:   Ht Readings from Last 1 Encounters:  10/30/14 6\' 1"  (1.854 m)    Weight:   Wt Readings from Last 1 Encounters:  11/06/14 229 lb 11.5 oz (104.2 kg)     BMI:  Body mass index is 30.31 kg/(m^2).  Estimated Nutritional Needs:   Kcal:  7588-3254 kcals (BEE 1885, 1.3 AF, 1.1-1.2 IF)   Protein:  114-135 g (1.1-1.3 g/kg)   Fluid:   2600-3120 mL (25-30 ml/kg)   EDUCATION NEEDS:   No education needs identified at this time  [place]. Zenia Resides, [place], [place] (pager)

## 2014-11-06 NOTE — Progress Notes (Signed)
Patient ID: James Keller, male   DOB: 02-23-1950, 64 y.o.   MRN: [id number]   Surgery  POD 9  S/P LOA colostomy closure  He is without complaints. His hiccups has resolved. He is continuing to pass gas and had 2 small bowel movements which were liquid. He feels that there is less abdominal distention compared to 2 days ago. No nausea no vomiting.   Remains on amiodarone drip as per cardiology.  Filed Vitals:   11/06/14 0400 11/06/14 0426 11/06/14 0500 11/06/14 0600  BP: 144/100  164/96 136/98  Pulse: 80  83 81  Temp:      TempSrc:      Resp: 20  23 26   Height:      Weight:  229 lb 11.5 oz (104.2 kg)    SpO2: 91%  94% 92%    PE:  He appears to be in normal sinus rhythm. He is alert and oriented. Lungs are clear heart regular rate and rhythm. Abdomen is soft and much less distended than yesterday. Wound is granulating nicely with dressing in place. Remaining staples are intact and there is no cellulitis present.  Labs  CBC Latest Ref Rng 11/06/2014 11/05/2014 11/04/2014  WBC 3.8 - 10.6 K/uL 5.8 5.2 4.0  Hemoglobin 13.0 - 18.0 g/dL 14.0 14.8 14.4  Hematocrit 40.0 - 52.0 % 41.8 45.7 44.1  Platelets 150 - 440 K/uL 179 214 210   CMP Latest Ref Rng 11/06/2014 11/05/2014 11/04/2014  Glucose 65 - 99 mg/dL 127(H) 129(H) 133(H)  BUN 6 - 20 mg/dL 17 15 14   Creatinine 0.61 - 1.24 mg/dL 0.96 1.04 1.04  Sodium 135 - 145 mmol/L 139 137 134(L)  Potassium 3.5 - 5.1 mmol/L 3.0(L) 3.5 3.2(L)  Chloride 101 - 111 mmol/L 103 102 99(L)  CO2 22 - 32 mmol/L 30 25 28   Calcium 8.9 - 10.3 mg/dL 8.8(L) 9.0 8.9  Total Protein 6.5 - 8.1 g/dL - - -  Total Bilirubin 0.3 - 1.2 mg/dL - - -  Alkaline Phos 38 - 126 U/L - - -  AST 15 - 41 U/L - - -  ALT 17 - 63 U/L - - -    I/O last 3 completed shifts: In: 3373.5 [I.V.:659.5; IV [id number]:086] Out: 5784 [Urine:1525; Emesis/NG output:1700; Stool:1]      IMP improving postoperative ileus. No signs of cellulitis. Appears to be normal sinus rhythm  on amiodarone drip. Tolerating TPN. Potassium is low normal be repleted once more. Magnesium within normal limits.  Plan  Replete potassium. Nasogastric tube to gravity. Continue TPN. Discontinue Unasyn. Once able to take meds by mouth plan to convert amiodarone to oral form.

## 2014-11-06 NOTE — Progress Notes (Signed)
Dr. Felton Clinton at bedside to eval pt.  Awaiting orders.

## 2014-11-06 NOTE — Progress Notes (Signed)
[facility] NOTE   Pharmacy Consult for Electrolyte and Glucose management  Indication: TPN  Allergies  Allergen Reactions  . No Known Allergies     Patient Measurements: Height: 6\' 1"  (185.4 cm) Weight: 229 lb 11.5 oz (104.2 kg) IBW/kg (Calculated) : 79.9   Vital Signs: Temp: 99.5 F (37.5 C) (10/14 1922) Temp Source: Oral (10/14 1922) BP: 147/83 mmHg (10/14 2100) Pulse Rate: 85 (10/14 2100) Intake/Output from previous day: 10/13 0701 - 10/14 0700 In: 2373.1 [I.V.:399.1; IV Piggyback:400; TPN:1574] Out: 1726 [Urine:825; Emesis/NG output:900; Stool:1] Intake/Output from this shift: Total I/O In: 166 [TPN:166] Out: -   Labs:  Recent Labs  11/04/14 0500 11/05/14 0500 11/06/14 0525  WBC 4.0 5.2 5.8  HGB 14.4 14.8 14.0  HCT 44.1 45.7 41.8  PLT 210 214 179     Recent Labs  11/04/14 0500 11/04/14 2130 11/05/14 0415 11/05/14 0500 11/06/14 0525 11/06/14 1410  NA 138 134*  --  137 139  --   K 3.1* 3.2*  --  3.5 3.0* 5.1  CL 103 99*  --  102 103  --   CO2 26 28  --  25 30  --   GLUCOSE 131* 133*  --  129* 127*  --   BUN 15 14  --  15 17  --   CREATININE 0.98 1.04  --  1.04 0.96  --   CALCIUM 8.7* 8.9  --  9.0 8.8*  --   MG 2.1 2.0  --   --  2.2  --   PHOS 3.5 3.8  --   --  3.6  --   TRIG  --   --  125  --   --   --    Estimated Creatinine Clearance: 99.8 mL/min (by C-G formula based on Cr of 0.96).    Recent Labs  11/06/14 1145 11/06/14 1638 11/06/14 1921  GLUCAP 114* 117* 112*    Medical History: Past Medical History  Diagnosis Date  . DVT (deep venous thrombosis) (HCC)     RIGHT LEG/ HX OF  . S/P chemotherapy, time since less than 4 weeks   . Hypertension   . 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 DVT (deep venous thrombosis) ([place])   . PAF (paroxysmal atrial fibrillation) ([place])   . Lower extremity edema     Rt leg  . Colon cancer ([place])     a. 11/2013 s/p colon resection/Hartmann's procedure;   b. Chemo: FOLFOX completed 07/2014.  . Tobacco abuse     Medications:  Scheduled:  . amiodarone  200 mg Oral Daily  . digoxin  0.25 mg Oral Daily  . diltiazem  30 mg Oral 4 times per day  . enoxaparin (LOVENOX) injection  40 mg Subcutaneous Q24H  . gabapentin  300 mg Oral TID  . insulin aspart  0-15 Units Subcutaneous 6 times per day  . metoprolol tartrate  37.5 mg Oral BID   Infusions:  . Marland KitchenTPN (CLINIMIX-E) Adult 83 mL/hr at 11/06/14 1726  . amiodarone Stopped (11/06/14 1900)    Insulin Requirements in the past 24 hours: 0  Initial   Plan:  Patient currently on Clinimix E5/20 with MVI @ 83 mL/hr. Patient received potassium 42mEQ IV replacement.   Electrolyte panel ordered with am labs. Will continue on SSI.    Pharmacy will continue to monitor and adjust per consult.   Simpson,Michael L 11/06/2014,9:44 PM

## 2014-11-06 NOTE — Progress Notes (Signed)
Pt dislodged NG tube while up on bedside commode.  Dr. Felton Clinton made aware.  He does not wish to replace the tube at this time.

## 2014-11-06 NOTE — Progress Notes (Signed)
[place] [facility] [place] NAME: James Keller    MR#:  [id number]  DATE OF BIRTH:  24-Oct-1950  SUBJECTIVE:  CHIEF COMPLAINT:  No chief complaint on file.  NG tube is out.  Had a BM today.  No nausea/vomiting. Converted to NSR w/ PAC's.  Remains on low dose Amio gtt.    REVIEW OF SYSTEMS:    Review of Systems  Constitutional: Negative for fever and chills.  HENT: Negative for congestion and tinnitus.   Eyes: Negative for blurred vision and double vision.  Respiratory: Negative for cough, shortness of breath and wheezing.   Cardiovascular: Negative for chest pain, orthopnea and PND.  Gastrointestinal: Positive for abdominal pain. Negative for nausea, vomiting and diarrhea.  Genitourinary: Negative for dysuria and hematuria.  Neurological: Positive for weakness (generalized). Negative for dizziness, sensory change and focal weakness.  All other systems reviewed and are negative.   Nutrition: NPO Tolerating Diet: No due to ileus.   Tolerating PT: Yes  DRUG ALLERGIES:   Allergies  Allergen Reactions  . No Known Allergies     VITALS:  Blood pressure 154/99, pulse 76, temperature 98.5 F (36.9 C), temperature source Oral, resp. rate 28, height 6\' 1"  (1.854 m), weight 104.2 kg (229 lb 11.5 oz), SpO2 93 %.  PHYSICAL EXAMINATION:   Physical Exam  GENERAL:  64 y.o.-year-old obese patient lying in the bed in no acute distress.  EYES: Pupils equal, round, reactive to light and accommodation. No scleral icterus. Extraocular muscles intact.  HEENT: Head atraumatic, normocephalic. Oropharynx and nasopharynx clear.  NECK:  Supple, no jugular venous distention. No thyroid enlargement, no tenderness.  LUNGS: Normal breath sounds bilaterally, no wheezing, rales, rhonchi. No use of accessory muscles of respiration.  CARDIOVASCULAR: S1, S2 irregular. No murmurs, rubs, or gallops.  ABDOMEN: Soft, distended. + BS. No organomegaly. Positive  midabdominal dressing from recent surgery with no acute drainage. EXTREMITIES: No cyanosis, clubbing, + 1 peripheral edema b/l.   NEUROLOGIC: Cranial nerves II through XII are intact. No focal Motor or sensory deficits b/l.  Globally weak. PSYCHIATRIC: The patient is alert and oriented x 3. Good affect SKIN: No obvious rash, lesion, or ulcer.    LABORATORY PANEL:   CBC  Recent Labs Lab 11/06/14 0525  WBC 5.8  HGB 14.0  HCT 41.8  PLT 179   ------------------------------------------------------------------------------------------------------------------  Chemistries   Recent Labs Lab 11/01/14 0422  11/06/14 0525 11/06/14 1410  NA 138  < > 139  --   K 4.0  < > 3.0* 5.1  CL 102  < > 103  --   CO2 30  < > 30  --   GLUCOSE 122*  < > 127*  --   BUN 13  < > 17  --   CREATININE 1.43*  < > 0.96  --   CALCIUM 8.9  < > 8.8*  --   MG  --   < > 2.2  --   AST 33  --   --   --   ALT 40  --   --   --   ALKPHOS 84  --   --   --   BILITOT 3.3*  --   --   --   < > = values in this interval not displayed. ------------------------------------------------------------------------------------------------------------------  Cardiac Enzymes  Recent Labs Lab 10/30/14 1937  TROPONINI <0.03   ------------------------------------------------------------------------------------------------------------------  RADIOLOGY:  Dg Abd 1 View  11/05/2014  CLINICAL DATA:  Ileus.  EXAM: ABDOMEN - 1 VIEW COMPARISON:  11/04/2014. FINDINGS: NG tube noted in stable position. Persistent dilated loops of small and large bowel noted consistent adynamic ileus. Minimal improvement. No free air. Surgical staples over the abdomen. ORIF right hip. IMPRESSION: 1. NG tube in stable position. 2. Persistent dilated loops of small and large bowel consistent with adynamic ileus. Minimal improvement. No free air. Electronically Signed   By: Marcello Moores  Register   On: 11/05/2014 10:31   Dg Abd 1 View  11/04/2014  CLINICAL  DATA:  Nasogastric tube placement EXAM: ABDOMEN - 1 VIEW COMPARISON:  Study obtained earlier in the day FINDINGS: Nasogastric tube tip and side port are in the stomach. Generalized bowel dilatation is again noted. Skin staples are noted just to the left of midline and overlying the left mid abdominal region. There is mild bibasilar atelectasis. IMPRESSION: Nasogastric tube tip and side port in stomach. Bowel gas pattern most consistent with a degree of postoperative ileus. Electronically Signed   By: Lowella Grip III M.D.   On: 11/04/2014 17:19   Dg Chest Port 1 View  11/04/2014  CLINICAL DATA:  PICC line placement EXAM: PORTABLE CHEST 1 VIEW COMPARISON:  10/21/2014 FINDINGS: Right Port-A-Cath remains in place, unchanged. Interval placement of left PICC line with the tip in the SVC. Very low lung volumes with bibasilar opacities, likely atelectasis. No definite effusions. No acute bony abnormality. Heart size is accentuated by the low volumes, likely within normal limits. IMPRESSION: Low lung volumes, bibasilar atelectasis. Left PICC line tip in the SVC. Electronically Signed   By: Rolm Baptise M.D.   On: 11/04/2014 17:19     ASSESSMENT AND PLAN:   64 year old male with past history of paroxysmal atrial fibrillation/flutter, hypertension, short of colon cancer status post colostomy, history of previous DVT, tobacco abuse who presented to the hospital for a colostomy closure and noted to be in atrial fibrillation with rapid ventricular response.  #1 atrial fibrillation with RVR-converted to NSR now.  HR stable and hemodynamically stable.  - Will wean Amio gtt and d/c today. Start Oral Amio. Discussed w/ Cardiology.  - cont. Oral Cardizem, Metoprolol, Digoxin.    #2 hypertension-continue metoprolol, Cardizem. - cont. PRN Hydralazine.   #3 status post colostomy closure- ileus is improving and NG tube is out.  - cont. Supportive care as per Surgery.  - cont. TPN.   #4 GERD-continue  Protonix.  #5 neuropathy-continue gabapentin.  #6 hypokalemia-improved w/ supplementation.    All the records are reviewed and case discussed with Care Management/Social Workerr. Management plans discussed with the patient, family and they are in agreement.  CODE STATUS: Full  DVT Prophylaxis: Lovenox  TOTAL TIME TAKING CARE OF THIS PATIENT: 25 minutes.   POSSIBLE D/C unclear, DEPENDING ON CLINICAL CONDITION.   Henreitta Leber M.D on 11/06/2014 at 2:34 PM  Between 7am to 6pm - Pager - [phone]  After 6pm go to [email or web address] - password [email or web address]  [place] [facility]  Office  [phone]  CC: Primary care physician; No primary care provider on file.

## 2014-11-07 LAB — GLUCOSE, CAPILLARY
GLUCOSE-CAPILLARY: 110 mg/dL — AB (ref 65–99)
GLUCOSE-CAPILLARY: 112 mg/dL — AB (ref 65–99)
GLUCOSE-CAPILLARY: 113 mg/dL — AB (ref 65–99)
GLUCOSE-CAPILLARY: 118 mg/dL — AB (ref 65–99)
GLUCOSE-CAPILLARY: 97 mg/dL (ref 65–99)
Glucose-Capillary: 120 mg/dL — ABNORMAL HIGH (ref 65–99)

## 2014-11-07 LAB — BASIC METABOLIC PANEL
ANION GAP: 5 (ref 5–15)
BUN: 15 mg/dL (ref 6–20)
CALCIUM: 8.7 mg/dL — AB (ref 8.9–10.3)
CO2: 29 mmol/L (ref 22–32)
Chloride: 102 mmol/L (ref 101–111)
Creatinine, Ser: 0.91 mg/dL (ref 0.61–1.24)
GFR calc Af Amer: >60 mL/min (ref >60)
GFR calc non Af Amer: >60 mL/min (ref >60)
GLUCOSE: 107 mg/dL — AB (ref 65–99)
Potassium: 3.2 mmol/L — ABNORMAL LOW (ref 3.5–5.1)
Sodium: 136 mmol/L (ref 135–145)

## 2014-11-07 LAB — MAGNESIUM: Magnesium: 2.1 mg/dL (ref 1.7–2.4)

## 2014-11-07 LAB — PHOSPHORUS: Phosphorus: 4.2 mg/dL (ref 2.5–4.6)

## 2014-11-07 MED ORDER — SODIUM CHLORIDE 0.9 % IJ SOLN
10.0000 mL | INTRAMUSCULAR | Status: DC | PRN
  Administered 2014-11-07 – 2014-11-08 (×3): 20 mL
  Filled 2014-11-07 (×3): qty 40

## 2014-11-07 MED ORDER — POTASSIUM CHLORIDE 10 MEQ/100ML IV SOLN
10.0000 meq | INTRAVENOUS | Status: AC
  Administered 2014-11-07 (×3): 10 meq via INTRAVENOUS
  Filled 2014-11-07 (×3): qty 100

## 2014-11-07 MED ORDER — TRACE MINERALS CR-CU-MN-SE-ZN 10-1000-500-60 MCG/ML IV SOLN
INTRAVENOUS | Status: DC
  Administered 2014-11-07: 17:00:00 via INTRAVENOUS
  Filled 2014-11-07: qty 1992

## 2014-11-07 MED ORDER — AMIODARONE HCL 200 MG PO TABS: 200.0000 mg | ORAL_TABLET | Freq: Two times a day (BID) | ORAL | Status: DC

## 2014-11-07 MED ORDER — SODIUM CHLORIDE 0.9 % IJ SOLN
10.0000 mL | Freq: Two times a day (BID) | INTRAMUSCULAR | Status: DC
  Administered 2014-11-08: 20 mL
  Administered 2014-11-08 – 2014-11-09 (×3): 10 mL
  Administered 2014-11-10: 20 mL
  Administered 2014-11-10 – 2014-11-11 (×2): 10 mL

## 2014-11-07 MED ORDER — AMIODARONE HCL 200 MG PO TABS
200.0000 mg | ORAL_TABLET | Freq: Every day | ORAL | Status: DC
Start: 2014-11-07 — End: 2014-11-11
  Administered 2014-11-07 – 2014-11-11 (×5): 200 mg via ORAL
  Filled 2014-11-07 (×5): qty 1

## 2014-11-07 MED ORDER — METOPROLOL TARTRATE 50 MG PO TABS
50.0000 mg | ORAL_TABLET | Freq: Four times a day (QID) | ORAL | Status: DC
  Administered 2014-11-07 – 2014-11-09 (×7): 50 mg via ORAL
  Filled 2014-11-07 (×8): qty 1

## 2014-11-07 MED ORDER — AMIODARONE HCL 200 MG PO TABS: 400.0000 mg | ORAL_TABLET | Freq: Two times a day (BID) | ORAL | Status: DC

## 2014-11-07 NOTE — Progress Notes (Signed)
Nutrition Follow-up   INTERVENTION:  PN: Continue TPN at this time at goal rate to meet nutritional needs   NUTRITION DIAGNOSIS:   Inadequate oral intake related to acute illness as evidenced by NPO status, being addressed with TPN    GOAL:   Patient will meet greater than or equal to 90% of their needs    MONITOR:    (Energy Intake, Anthropometrics, Digestive System, Electrolyte/Renal Profile)  REASON FOR ASSESSMENT:   NPO/Clear Liquid Diet    ASSESSMENT:      Pt pulled out NG tube and remains out at this time.    Current Nutrition: NPO, Tolerating TPN via PICC line at 55ml/hr   Gastrointestinal Profile: no BM over night, 2 BMs yesterday, no nausea, vomiting Last BM: 10/14   Medications: KCL  Electrolyte/Renal Profile and Glucose Profile:   Recent Labs Lab 11/04/14 2130 11/05/14 0500 11/06/14 0525 11/06/14 1410 11/07/14 0455  NA 134* 137 139  --  136  K 3.2* 3.5 3.0* 5.1 3.2*  CL 99* 102 103  --  102  CO2 28 25 30   --  29  BUN 14 15 17   --  15  CREATININE 1.04 1.04 0.96  --  0.91  CALCIUM 8.9 9.0 8.8*  --  8.7*  MG 2.0  --  2.2  --  2.1  PHOS 3.8  --  3.6  --  4.2  GLUCOSE 133* 129* 127*  --  107*   Protein Profile:  Recent Labs Lab 11/01/14 0422  ALBUMIN 3.0*      Weight Trend since Admission: Filed Weights   11/05/14 0435 11/06/14 0426 11/07/14 0500  Weight: 230 lb 2.6 oz (104.4 kg) 229 lb 11.5 oz (104.2 kg) 230 lb 13.2 oz (104.7 kg)      Diet Order:  Diet NPO time specified Except for: Sips with Meds, Ice Chips .TPN (CLINIMIX-E) Adult .TPN (CLINIMIX-E) Adult  Skin:   reviewed   Height:   Ht Readings from Last 1 Encounters:  10/30/14 6\' 1"  (1.854 m)    Weight:   Wt Readings from Last 1 Encounters:  11/07/14 230 lb 13.2 oz (104.7 kg)     BMI:  Body mass index is 30.46 kg/(m^2).  Estimated Nutritional Needs:   Kcal:  5631-4970 kcals (BEE 1885, 1.3 AF, 1.1-1.2 IF)   Protein:  114-135 g (1.1-1.3 g/kg)   Fluid:   2600-3120 mL (25-30 ml/kg)   EDUCATION NEEDS:   No education needs identified at this time  [place]. Zenia Resides, [place], [place] (pager)

## 2014-11-07 NOTE — Progress Notes (Signed)
[place] at [place] NAME: James Keller    MR#:  [id number]  DATE OF BIRTH:  February 09, 1950  SUBJECTIVE:  CHIEF COMPLAINT:  No chief complaint on file.  NG tube is out.  As been having bowel movements over the past few days.  Denies nausea/vomiting. Converted to NSR w/ PAC's.  Now on low-dose of amiodarone orally. Denies any significant pain but admits of some discomfort in abdomen, as well as distention, denies burping today, admits of passing gas    REVIEW OF SYSTEMS:    Review of Systems  Constitutional: Negative for fever and chills.  HENT: Negative for congestion and tinnitus.   Eyes: Negative for blurred vision and double vision.  Respiratory: Negative for cough, shortness of breath and wheezing.   Cardiovascular: Negative for chest pain, orthopnea and PND.  Gastrointestinal: Positive for abdominal pain. Negative for nausea, vomiting and diarrhea.  Genitourinary: Negative for dysuria and hematuria.  Neurological: Positive for weakness (generalized). Negative for dizziness, sensory change and focal weakness.  All other systems reviewed and are negative.   Nutrition: NPO Tolerating Diet: No due to ileus.   Tolerating PT: Yes  DRUG ALLERGIES:   Allergies  Allergen Reactions  . No Known Allergies     VITALS:  Blood pressure 144/91, pulse 80, temperature 98.7 F (37.1 C), temperature source Oral, resp. rate 26, height 6\' 1"  (1.854 m), weight 104.7 kg (230 lb 13.2 oz), SpO2 93 %.  PHYSICAL EXAMINATION:   Physical Exam  GENERAL:  64 y.o.-year-old obese patient lying in the bed in no acute distress.  EYES: Pupils equal, round, reactive to light and accommodation. No scleral icterus. Extraocular muscles intact.  HEENT: Head atraumatic, normocephalic. Oropharynx and nasopharynx clear.  NECK:  Supple, no jugular venous distention. No thyroid enlargement, no tenderness.  LUNGS: Diminished breath sounds bilaterally at bases,  no wheezing, rales, rhonchi. No use of accessory muscles of respiration.  CARDIOVASCULAR: S1, S2 , regular. No murmurs, rubs, or gallops.  ABDOMEN: Soft, distended.. . Bowel sounds are diminishedNo organomegaly. midabdominal dressing from recent surgery with no acute drainage. EXTREMITIES: No cyanosis, clubbing, + 1 peripheral edema b/l.   NEUROLOGIC: Cranial nerves II through XII are intact. No focal Motor or sensory deficits b/l.  Globally weak. PSYCHIATRIC: The patient is alert and oriented x 3. Good affect SKIN: No obvious rash, lesion, or ulcer.    LABORATORY PANEL:   CBC  Recent Labs Lab 11/06/14 0525  WBC 5.8  HGB 14.0  HCT 41.8  PLT 179   ------------------------------------------------------------------------------------------------------------------  Chemistries   Recent Labs Lab 11/01/14 0422  11/07/14 0455  NA 138  < > 136  K 4.0  < > 3.2*  CL 102  < > 102  CO2 30  < > 29  GLUCOSE 122*  < > 107*  BUN 13  < > 15  CREATININE 1.43*  < > 0.91  CALCIUM 8.9  < > 8.7*  MG  --   < > 2.1  AST 33  --   --   ALT 40  --   --   ALKPHOS 84  --   --   BILITOT 3.3*  --   --   < > = values in this interval not displayed. ------------------------------------------------------------------------------------------------------------------  Cardiac Enzymes No results for input(s): TROPONINI in the last 168 hours. ------------------------------------------------------------------------------------------------------------------  RADIOLOGY:  No results found.   ASSESSMENT AND PLAN:   64 year old male with past history of paroxysmal atrial fibrillation/flutter,  hypertension, short of colon cancer status post colostomy, history of previous DVT, tobacco abuse who presented to the hospital for a colostomy closure and noted to be in atrial fibrillation with rapid ventricular response.  #1 atrial fibrillation with RVR-converted to NSR now.  HR stable and hemodynamically stable.   - Continue low-doseral Amio due to history of paroxysmal atrial fibrillation . Discussed w/ Cardiology Dr. Clayborn Bigness.  - cont. Oral Cardizem, Metoprolol, Digoxin.   Check digoxin level tomorrow morning  #2 hypertension-continue metoprolol, Cardizem. advanced metoprolol dose  - DiscontinueRN Hydralazine. . Initiate patient on labetalol or Vasotec intravenously if needed  #3 status post colostomy closure- ileus is improving and NG tube is out. . Patient still has abdominal distention. Surgery is  planning to initiate oral intake over the next few days, patient has bowel movements and decreased burping - cont. Supportive care as per Surgery.  - cont. TPN.   #4 GERD-continue Protonix.  #5 neuropathy-continue gabapentin.  #6 hypokalemia-improved w/ supplementation.   Supplemented intravenously today again . Magnesium level was normal  All the records are reviewed and case discussed with Care Management/Social Workerr. Management plans discussed with the patient, family and they are in agreement.  CODE STATUS: Full  DVT Prophylaxis: Lovenox  TOTAL TIME TAKING CARE OF THIS PATIENT: 30 minutes.   POSSIBLE D/C unclear, DEPENDING ON CLINICAL CONDITION.   Theodoro Grist M.D on 11/07/2014 at 1:43 PM  Between 7am to 6pm - Pager - [phone]  After 6pm go to [email or web address] - password [email or web address]  [place] [facility]  Office  [phone]  CC: Primary care physician; No primary care provider on file.

## 2014-11-07 NOTE — Progress Notes (Signed)
Dr Adonis Huguenin notified of increased abdominal distension. No further orders at this time.

## 2014-11-07 NOTE — Progress Notes (Signed)
[place] NOTE   Pharmacy Consult for Electrolyte and Glucose management  Indication: TPN  Allergies  Allergen Reactions  . No Known Allergies     Patient Measurements: Height: 6\' 1"  (185.4 cm) Weight: 230 lb 13.2 oz (104.7 kg) IBW/kg (Calculated) : 79.9   Vital Signs: Temp: 98.7 F (37.1 C) (10/15 0230) Temp Source: Oral (10/15 0230) BP: 135/86 mmHg (10/15 0500) Pulse Rate: 71 (10/15 0500) Intake/Output from previous day: 10/14 0701 - 10/15 0700 In: 2205.3 [I.V.:166.3; TPN:2039] Out: 1350 [Urine:1000; Emesis/NG output:350] Intake/Output from this shift: Total I/O In: 830 [TPN:830] Out: 550 [Urine:550]  Labs:  Recent Labs  11/05/14 0500 11/06/14 0525  WBC 5.2 5.8  HGB 14.8 14.0  HCT 45.7 41.8  PLT 214 179     Recent Labs  11/04/14 2130 11/05/14 0415 11/05/14 0500 11/06/14 0525 11/06/14 1410 11/07/14 0455  NA 134*  --  137 139  --  136  K 3.2*  --  3.5 3.0* 5.1 3.2*  CL 99*  --  102 103  --  102  CO2 28  --  25 30  --  29  GLUCOSE 133*  --  129* 127*  --  107*  BUN 14  --  15 17  --  15  CREATININE 1.04  --  1.04 0.96  --  0.91  CALCIUM 8.9  --  9.0 8.8*  --  8.7*  MG 2.0  --   --  2.2  --  2.1  PHOS 3.8  --   --  3.6  --  4.2  TRIG  --  125  --   --   --   --    Estimated Creatinine Clearance: 105.5 mL/min (by C-G formula based on Cr of 0.91).    Recent Labs  11/06/14 1921 11/06/14 2352 11/07/14 0409  GLUCAP 112* 108* 118*    Medical History: Past Medical History  Diagnosis Date  . DVT (deep venous thrombosis) (HCC)     RIGHT LEG/ HX OF  . S/P chemotherapy, time since less than 4 weeks   . Hypertension   . 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 DVT (deep venous thrombosis) ([place])   . PAF (paroxysmal atrial fibrillation) ([place])   . Lower extremity edema     Rt leg  . Colon cancer ([place])     a. 11/2013 s/p colon resection/Hartmann's procedure;  b. Chemo: FOLFOX completed 07/2014.  .  Tobacco abuse     Medications:  Scheduled:  . amiodarone  200 mg Oral Daily  . digoxin  0.25 mg Oral Daily  . diltiazem  30 mg Oral 4 times per day  . enoxaparin (LOVENOX) injection  40 mg Subcutaneous Q24H  . gabapentin  300 mg Oral TID  . insulin aspart  0-15 Units Subcutaneous 6 times per day  . metoprolol tartrate  37.5 mg Oral BID  . potassium chloride  10 mEq Intravenous Q1 Hr x 3   Infusions:  . Marland KitchenTPN (CLINIMIX-E) Adult 83 mL/hr at 11/06/14 1726  . amiodarone Stopped (11/06/14 1900)    Insulin Requirements in the past 24 hours: 0  Initial   Plan:  Patient currently on Clinimix E5/20 with MVI @ 83 mL/hr. Patient received potassium 23mEQ IV replacement.   Electrolyte panel ordered with am labs. Will continue on SSI.    1015 AM K 3.2, phos 4.2, Mg 2.1. Ordered potassium chloride 10 mEq IV Q1H x 3 doses and  BMP for tomorrow morning.   Pharmacy will continue to monitor and adjust per consult.   Laural Benes, Pharm.D. Clinical Pharmacist 11/07/2014,5:28 AM

## 2014-11-07 NOTE — Progress Notes (Signed)
Patient ID: James Keller, male   DOB: 1950/11/06, 64 y.o.   MRN: [id number]   POD 10  He is without complaints. He is having flatus.  Filed Vitals:   11/07/14 0800 11/07/14 0900 11/07/14 1000 11/07/14 1237  BP: 147/94 157/86 156/87 144/91  Pulse: 74 46 80   Temp:      TempSrc:      Resp: 23 25 26    Height:      Weight:      SpO2: 94% 91% 93%     Pe:  Abdomen is somewhat distended but soft and without peritoneal signs. Dressing is intact.  CBC Latest Ref Rng 11/06/2014 11/05/2014 11/04/2014  WBC 3.8 - 10.6 K/uL 5.8 5.2 4.0  Hemoglobin 13.0 - 18.0 g/dL 14.0 14.8 14.4  Hematocrit 40.0 - 52.0 % 41.8 45.7 44.1  Platelets 150 - 440 K/uL 179 214 210    BMP Latest Ref Rng 11/07/2014 11/06/2014 11/06/2014  Glucose 65 - 99 mg/dL 107(H) - 127(H)  BUN 6 - 20 mg/dL 15 - 17  Creatinine 0.61 - 1.24 mg/dL 0.91 - 0.96  Sodium 135 - 145 mmol/L 136 - 139  Potassium 3.5 - 5.1 mmol/L 3.2(L) 5.1 3.0(L)  Chloride 101 - 111 mmol/L 102 - 103  CO2 22 - 32 mmol/L 29 - 30  Calcium 8.9 - 10.3 mg/dL 8.7(L) - 8.8(L)    Impression stable postoperative day 10.  Plan transition to floor care with telemetry. I will advance his diet the next 24 hours. He still somewhat distended for this. Continued total parenteral nutrition.

## 2014-11-08 ENCOUNTER — Encounter: Payer: Self-pay | Admitting: Radiology

## 2014-11-08 ENCOUNTER — Inpatient Hospital Stay: Payer: Medicaid Other

## 2014-11-08 LAB — BASIC METABOLIC PANEL
ANION GAP: 7 (ref 5–15)
BUN: 16 mg/dL (ref 6–20)
CO2: 29 mmol/L (ref 22–32)
Calcium: 8.9 mg/dL (ref 8.9–10.3)
Chloride: 102 mmol/L (ref 101–111)
Creatinine, Ser: 0.94 mg/dL (ref 0.61–1.24)
GFR calc Af Amer: >60 mL/min (ref >60)
GFR calc non Af Amer: >60 mL/min (ref >60)
Glucose, Bld: 105 mg/dL — ABNORMAL HIGH (ref 65–99)
POTASSIUM: 3.6 mmol/L (ref 3.5–5.1)
SODIUM: 138 mmol/L (ref 135–145)

## 2014-11-08 LAB — DIGOXIN LEVEL

## 2014-11-08 LAB — GLUCOSE, CAPILLARY
GLUCOSE-CAPILLARY: 93 mg/dL (ref 65–99)
GLUCOSE-CAPILLARY: 104 mg/dL — AB (ref 65–99)
GLUCOSE-CAPILLARY: 112 mg/dL — AB (ref 65–99)
Glucose-Capillary: 100 mg/dL — ABNORMAL HIGH (ref 65–99)
Glucose-Capillary: 110 mg/dL — ABNORMAL HIGH (ref 65–99)
Glucose-Capillary: 101 mg/dL — ABNORMAL HIGH (ref 65–99)

## 2014-11-08 LAB — PHOSPHORUS: Phosphorus: 4.1 mg/dL (ref 2.5–4.6)

## 2014-11-08 LAB — MAGNESIUM: Magnesium: 2 mg/dL (ref 1.7–2.4)

## 2014-11-08 MED ORDER — TRACE MINERALS CR-CU-MN-SE-ZN 10-1000-500-60 MCG/ML IV SOLN
INTRAVENOUS | Status: DC
  Administered 2014-11-08: 17:00:00 via INTRAVENOUS
  Filled 2014-11-08 (×3): qty 1992

## 2014-11-08 MED ORDER — IOHEXOL 300 MG/ML  SOLN
100.0000 mL | Freq: Once | INTRAMUSCULAR | Status: AC | PRN
  Administered 2014-11-08: 100 mL via INTRAVENOUS

## 2014-11-08 NOTE — Plan of Care (Signed)
Problem: Phase I Progression Outcomes Goal: Other Phase I Outcomes/Goals Outcome: Progressing Pt is alert and oriented, c/o abdominal pain improved with dilaudid, on room air, vital signs stable, TPN infusing, diet advanced to full liquid diet, tolerating diet with no n/v, abdominal dressing changed, wife updated at bedsite, passing reports passing flatus , no bm throughout shift, abdominal CT performed in am, bedbound at this time, uneventful shift.

## 2014-11-08 NOTE — Progress Notes (Signed)
Per Dr. Marina Gravel, pt is to receive IV contrast and not oral contrast, CT notified.

## 2014-11-08 NOTE — Progress Notes (Signed)
[facility] [place] NAME: James Keller    MR#:  [id number]  DATE OF BIRTH:  25-Mar-1950  SUBJECTIVE:  CHIEF COMPLAINT:  No chief complaint on file.  NG tube is out.  As been having bowel movements over the past few days.  Denies nausea/vomiting. Converted to NSR w/ PAC's.  Now on low-dose of amiodarone orally. Denies any significant pain but admits of some discomfort in abdomen, as well as distention, admits of burping , but continues to pass gas    REVIEW OF SYSTEMS:    Review of Systems  Constitutional: Negative for fever and chills.  HENT: Negative for congestion and tinnitus.   Eyes: Negative for blurred vision and double vision.  Respiratory: Negative for cough, shortness of breath and wheezing.   Cardiovascular: Negative for chest pain, orthopnea and PND.  Gastrointestinal: Positive for abdominal pain. Negative for nausea, vomiting and diarrhea.  Genitourinary: Negative for dysuria and hematuria.  Neurological: Positive for weakness (generalized). Negative for dizziness, sensory change and focal weakness.  All other systems reviewed and are negative.   Nutrition: NPO Tolerating Diet: No due to ileus.   Tolerating PT: Yes  DRUG ALLERGIES:   Allergies  Allergen Reactions  . No Known Allergies     VITALS:  Blood pressure 133/83, pulse 64, temperature 98.9 F (37.2 C), temperature source Oral, resp. rate 20, height 6\' 1"  (1.854 m), weight 104 kg (229 lb 4.5 oz), SpO2 94 %.  PHYSICAL EXAMINATION:   Physical Exam  GENERAL:  64 y.o.-year-old obese patient lying in the bed in no acute distress.  EYES: Pupils equal, round, reactive to light and accommodation. No scleral icterus. Extraocular muscles intact.  HEENT: Head atraumatic, normocephalic. Oropharynx and nasopharynx clear.  NECK:  Supple, no jugular venous distention. No thyroid enlargement, no tenderness.  LUNGS: Diminished breath sounds bilaterally at bases, no  wheezing, rales, rhonchi. No use of accessory muscles of respiration.  CARDIOVASCULAR: S1, S2 , regular. No murmurs, rubs, or gallops.  ABDOMEN: Soft, less distended.. . Bowel sounds are she improved, No organomegaly. midabdominal dressing from recent surgery with no acute drainage. EXTREMITIES: No cyanosis, clubbing, + 1 peripheral edema b/l.   NEUROLOGIC: Cranial nerves II through XII are intact. No focal Motor or sensory deficits b/l.  Globally weak. PSYCHIATRIC: The patient is alert and oriented x 3. Good affect SKIN: No obvious rash, lesion, or ulcer.    LABORATORY PANEL:   CBC  Recent Labs Lab 11/06/14 0525  WBC 5.8  HGB 14.0  HCT 41.8  PLT 179   ------------------------------------------------------------------------------------------------------------------  Chemistries   Recent Labs Lab 11/08/14 0434  NA 138  K 3.6  CL 102  CO2 29  GLUCOSE 105*  BUN 16  CREATININE 0.94  CALCIUM 8.9  MG 2.0   ------------------------------------------------------------------------------------------------------------------  Cardiac Enzymes No results for input(s): TROPONINI in the last 168 hours. ------------------------------------------------------------------------------------------------------------------  RADIOLOGY:  Dg Abd 1 View  11/08/2014  CLINICAL DATA:  Postop ileus EXAM: ABDOMEN - 1 VIEW COMPARISON:  11/05/2014 FINDINGS: Persistent gaseous distended small bowel loops and proximal colon consistent with postop ileus. Postsurgical changes with midline skin staples. IMPRESSION: Persistent gaseous distended small bowel loops and proximal colon consistent with postop ileus. Electronically Signed   By: Lahoma Crocker M.D.   On: 11/08/2014 09:16   Ct Abdomen Pelvis W Contrast  11/08/2014  CLINICAL DATA:  S/P extensive lysis of adhesions colostomy takedown. No nausea no vomiting. There is minimal abdominal pain. He remains  somewhat distended however has had 2 bowel movements  in the last 24 hours and has continued to pass gas. ^162mL OMNIPAQUE IOHEXOL 300 MG/ML SOLN EXAM: CT ABDOMEN AND PELVIS WITH CONTRAST TECHNIQUE: Multidetector CT imaging of the abdomen and pelvis was performed using the standard protocol following bolus administration of intravenous contrast. CONTRAST:  169mL OMNIPAQUE IOHEXOL 300 MG/ML  SOLN COMPARISON:  CT of 09/30/2014.  Plain film of earlier today. FINDINGS: Lower chest: Bibasilar atelectasis. Normal heart size without pericardial or pleural effusion. Multivessel coronary artery atherosclerosis. Hepatobiliary: Normal liver. Normal gallbladder, without biliary ductal dilatation. Pancreas: Normal, without mass or ductal dilatation. Spleen: Normal in size, without focal abnormality. Adrenals/Urinary Tract: Left adrenal normal. Right adrenal nodule is similar in size at 1.3 cm. Too small to characterize bilateral renal lesions. An upper pole 12 mm partially calcified left renal lesion is indeterminate. There is also a 2.6 cm lower pole right renal lesion which has complexity. No hydronephrosis. Normal urinary bladder. Stomach/Bowel: Normal stomach, without wall thickening. Normal caliber of the colon, which is fluid filled. Surgical changes of partial left hemicolectomy. Interval take down of colostomy. Small bowel loops are fluid-filled. Proximally mid small bowel loops measure up to 5.0 cm within the left side of the abdomen on image/series 46/2. No focal transition point identified. No pneumatosis or free intraperitoneal air. Vascular/Lymphatic: Aortic and branch vessel atherosclerosis. Infrarenal aortic ectasia again identified at 3.4 cm. No abdominopelvic adenopathy. Reproductive: Normal prostate. Other: No significant free fluid. Tiny peripherally enhancing fluid collection within the left pericolic gutter measures 2 x 1.7 cm, on image/series 32/2. Musculoskeletal: No acute osseous abnormality. Degenerative disc disease at the lumbosacral junction. IMPRESSION:  1. Interval left-sided colostomy takedown. Fluid-filled colon throughout, suggesting postoperative adynamic ileus. Mild small bowel distention within the right-sided abdomen is also favored to be related to ileus. No transition point to suggest obstruction. 2. Tiny fluid collection within the left side of the abdomen is suspicious for abscess. Too small to be drained. 3. Minimal motion degradation. 4. Bilateral indeterminate renal lesions. As on the prior exam, outpatient pre and post contrast abdominal MRI should be considered. 5.  Atherosclerosis, including within the coronary arteries. Electronically Signed   By: Abigail Miyamoto M.D.   On: 11/08/2014 10:59     ASSESSMENT AND PLAN:   64 year old male with past history of paroxysmal atrial fibrillation/flutter, hypertension, short of colon cancer status post colostomy, history of previous DVT, tobacco abuse who presented to the hospital for a colostomy closure and noted to be in atrial fibrillation with rapid ventricular response.  #1 . Paroxysmal atrial fibrillation with RVR-converted to NSR now.  HR stable and hemodynamically stable.  - Continue low-doseral Amio due to history of paroxysmal atrial fibrillation . Discussed w/ Dr. Clayborn Bigness.  - cont. Oral Cardizem, Metoprolol, Digoxin.   digoxin level is low  #2 hypertension-continue metoprolol, Cardizem. Now on advanced metoprolol dose  - Discontinue PRN Hydralazine. . Initiate patient on lisinopril if blood pressure does not improve  #3 status post colostomy closure- ileus is improving and NG tube is out. . Patient still has abdominal distention. Surgery  Initiated clear liquid diet, patient has been having bowel movements and decreased burping - cont. Supportive care as per Surgery.  - cont. TPN.   #4 GERD-continue Protonix.  #5 neuropathy-continue gabapentin.  #6 hypokalemia - resolved w/ supplementation.   Magnesium level was normal.   All the records are reviewed and case discussed with  Care Management/Social Workerr. Management plans discussed with the patient,  family and they are in agreement.  CODE STATUS: Full  DVT Prophylaxis: Lovenox  TOTAL TIME TAKING CARE OF THIS PATIENT: 35 minutes.   POSSIBLE D/C unclear, DEPENDING ON CLINICAL CONDITION.   Theodoro Grist M.D on 11/08/2014 at 12:41 PM  Between 7am to 6pm - Pager - [phone]  After 6pm go to [email or web address] - password [email or web address] [facility]  [place] [facility]  Office  [phone]  CC: Primary care physician; No primary care provider on file.

## 2014-11-08 NOTE — Progress Notes (Signed)
[place] NOTE   Pharmacy Consult for Electrolyte and Glucose management  Indication: TPN  Allergies  Allergen Reactions  . No Known Allergies     Patient Measurements: Height: 6\' 1"  (185.4 cm) Weight: 229 lb 4.5 oz (104 kg) IBW/kg (Calculated) : 79.9   Vital Signs: BP: 133/83 mmHg (10/16 0000) Pulse Rate: 64 (10/16 0000) Intake/Output from previous day: 10/15 0701 - 10/16 0700 In: 787 [I.V.:40; MHD:622] Out: 1750 [Urine:1750] Intake/Output from this shift: Total I/O In: 704 [I.V.:40; TPN:664] Out: 750 [Urine:750]  Labs:  Recent Labs  11/06/14 0525  WBC 5.8  HGB 14.0  HCT 41.8  PLT 179     Recent Labs  11/06/14 0525 11/06/14 1410 11/07/14 0455 11/08/14 0434  NA 139  --  136 138  K 3.0* 5.1 3.2* 3.6  CL 103  --  102 102  CO2 30  --  29 29  GLUCOSE 127*  --  107* 105*  BUN 17  --  15 16  CREATININE 0.96  --  0.91 0.94  CALCIUM 8.8*  --  8.7* 8.9  MG 2.2  --  2.1  --   PHOS 3.6  --  4.2  --    Estimated Creatinine Clearance: 101.8 mL/min (by C-G formula based on Cr of 0.94).    Recent Labs  11/07/14 1949 11/07/14 2341 11/08/14 0339  GLUCAP 112* 110* 93    Medical History: Past Medical History  Diagnosis Date  . DVT (deep venous thrombosis) (HCC)     RIGHT LEG/ HX OF  . S/P chemotherapy, time since less than 4 weeks   . Hypertension   . 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 DVT (deep venous thrombosis) ([place])   . PAF (paroxysmal atrial fibrillation) ([place])   . Lower extremity edema     Rt leg  . Colon cancer ([place])     a. 11/2013 s/p colon resection/Hartmann's procedure;  b. Chemo: FOLFOX completed 07/2014.  . Tobacco abuse     Medications:  Scheduled:  . amiodarone  200 mg Oral Daily  . digoxin  0.25 mg Oral Daily  . diltiazem  30 mg Oral 4 times per day  . enoxaparin (LOVENOX) injection  40 mg Subcutaneous Q24H  . gabapentin  300 mg Oral TID  . insulin aspart  0-15 Units Subcutaneous 6  times per day  . metoprolol tartrate  50 mg Oral Q6H  . sodium chloride  10-40 mL Intracatheter Q12H   Infusions:  . Marland KitchenTPN (CLINIMIX-E) Adult 83 mL/hr at 11/07/14 1642    Insulin Requirements in the past 24 hours: 0  Initial   Plan:  Patient currently on Clinimix E5/20 with MVI @ 83 mL/hr. Patient received potassium 67mEQ IV replacement.   Electrolyte panel ordered with am labs. Will continue on SSI.    1015 AM K 3.2, phos 4.2, Mg 2.1. Ordered potassium chloride 10 mEq IV Q1H x 3 doses and BMP for tomorrow morning.    1016 AM electrolytes WNL, expect advancement of diet today per surgery. Will recheck electrolytes in AM.   Pharmacy will continue to monitor and adjust per consult.   Laural Benes, Pharm.D. Clinical Pharmacist 11/08/2014,5:47 AM

## 2014-11-08 NOTE — Progress Notes (Signed)
Patient ID: James Keller, male   DOB: 1950/10/09, 64 y.o.   MRN: [id number]   Surgery  POD 11  S/P extensive lysis of adhesions colostomy takedown. The patient remains in normal sinus rhythm on oral amiodarone and beta blocker. There has been no nausea no vomiting. There is minimal abdominal pain. He remains somewhat distended however has had 2 bowel movements in the last 24 hours and has continued to pass gas. A KUB was ordered this morning.  Filed Vitals:   11/07/14 2200 11/07/14 2300 11/08/14 0000 11/08/14 0419  BP: 150/85 147/111 133/83   Pulse: 67 65 64   Temp:      TempSrc:      Resp: 25 23 20    Height:      Weight:    229 lb 4.5 oz (104 kg)  SpO2: 94% 93% 94%     PE:  The patient's wound is granulating. His abdomen is soft there is no tenderness but however is somewhat distended and tympanitic in the right lower quadrant.  An 0 clear. Patient is alert and oriented 4. No lower extremity edema is noted. Calves are nontender.  Labs  CBC Latest Ref Rng 11/06/2014 11/05/2014 11/04/2014  WBC 3.8 - 10.6 K/uL 5.8 5.2 4.0  Hemoglobin 13.0 - 18.0 g/dL 14.0 14.8 14.4  Hematocrit 40.0 - 52.0 % 41.8 45.7 44.1  Platelets 150 - 440 K/uL 179 214 210   CMP Latest Ref Rng 11/08/2014 11/07/2014 11/06/2014  Glucose 65 - 99 mg/dL 105(H) 107(H) -  BUN 6 - 20 mg/dL 16 15 -  Creatinine 0.61 - 1.24 mg/dL 0.94 0.91 -  Sodium 135 - 145 mmol/L 138 136 -  Potassium 3.5 - 5.1 mmol/L 3.6 3.2(L) 5.1  Chloride 101 - 111 mmol/L 102 102 -  CO2 22 - 32 mmol/L 29 29 -  Calcium 8.9 - 10.3 mg/dL 8.9 8.7(L) -  Total Protein 6.5 - 8.1 g/dL - - -  Total Bilirubin 0.3 - 1.2 mg/dL - - -  Alkaline Phos 38 - 126 U/L - - -  AST 15 - 41 U/L - - -  ALT 17 - 63 U/L - - -    I/O last 3 completed shifts: In: 1700 [I.V.:40] Out: 2300 [Urine:2300] Total I/O In: 666.8 [TPN:666.8] Out: -     IMP:  Reviewed the KUB demonstrates a distended right colon and transverse colon. I'm concerned that there is  maybe an underlying partial colonic obstruction.  Plan:  Prior to advancing his diet I would obtain a CT scan of the abdomen and pelvis without oral contrast but with IV contrast. Potassium seems to be corrected.  Continue current wound care.  Awaiting transfer to the floor with bed availability.

## 2014-11-09 LAB — CBC
HCT: 39.4 % — ABNORMAL LOW (ref 40.0–52.0)
Hemoglobin: 13.2 g/dL (ref 13.0–18.0)
MCH: 28.6 pg (ref 26.0–34.0)
MCHC: 33.4 g/dL (ref 32.0–36.0)
MCV: 85.6 fL (ref 80.0–100.0)
PLATELETS: 219 10*3/uL (ref 150–440)
RBC: 4.61 MIL/uL (ref 4.40–5.90)
RDW: 15.6 % — AB (ref 11.5–14.5)
WBC: 5.3 10*3/uL (ref 3.8–10.6)

## 2014-11-09 LAB — BASIC METABOLIC PANEL
Anion gap: 4 — ABNORMAL LOW (ref 5–15)
BUN: 17 mg/dL (ref 6–20)
CALCIUM: 8.7 mg/dL — AB (ref 8.9–10.3)
CHLORIDE: 102 mmol/L (ref 101–111)
CO2: 28 mmol/L (ref 22–32)
CREATININE: 0.91 mg/dL (ref 0.61–1.24)
GFR calc Af Amer: >60 mL/min (ref >60)
GFR calc non Af Amer: >60 mL/min (ref >60)
Glucose, Bld: 109 mg/dL — ABNORMAL HIGH (ref 65–99)
POTASSIUM: 4 mmol/L (ref 3.5–5.1)
Sodium: 134 mmol/L — ABNORMAL LOW (ref 135–145)

## 2014-11-09 LAB — MAGNESIUM: MAGNESIUM: 2.1 mg/dL (ref 1.7–2.4)

## 2014-11-09 LAB — GLUCOSE, CAPILLARY
GLUCOSE-CAPILLARY: 115 mg/dL — AB (ref 65–99)
GLUCOSE-CAPILLARY: 110 mg/dL — AB (ref 65–99)
GLUCOSE-CAPILLARY: 97 mg/dL (ref 65–99)
GLUCOSE-CAPILLARY: 86 mg/dL (ref 65–99)
Glucose-Capillary: 114 mg/dL — ABNORMAL HIGH (ref 65–99)
Glucose-Capillary: 84 mg/dL (ref 65–99)
Glucose-Capillary: 101 mg/dL — ABNORMAL HIGH (ref 65–99)

## 2014-11-09 LAB — PHOSPHORUS: PHOSPHORUS: 4.5 mg/dL (ref 2.5–4.6)

## 2014-11-09 MED ORDER — GLUCERNA SHAKE PO LIQD
237.0000 mL | Freq: Three times a day (TID) | ORAL | Status: DC
  Administered 2014-11-09 – 2014-11-11 (×6): 237 mL via ORAL

## 2014-11-09 MED ORDER — METOPROLOL TARTRATE 25 MG PO TABS
25.0000 mg | ORAL_TABLET | Freq: Two times a day (BID) | ORAL | Status: DC
  Administered 2014-11-09 – 2014-11-11 (×4): 25 mg via ORAL
  Filled 2014-11-09 (×4): qty 1

## 2014-11-09 MED ORDER — HYDROMORPHONE HCL 1 MG/ML IJ SOLN: 0.5000 mg | INTRAMUSCULAR | Status: DC | PRN

## 2014-11-09 MED ORDER — OXYCODONE-ACETAMINOPHEN 5-325 MG PO TABS
2.0000 | ORAL_TABLET | ORAL | Status: DC | PRN
  Administered 2014-11-09 – 2014-11-11 (×6): 2 via ORAL
  Filled 2014-11-09 (×6): qty 2

## 2014-11-09 NOTE — Progress Notes (Signed)
[facility] at [place] NAME: James Keller    MR#:  [id number]  DATE OF BIRTH:  [birth year]  SUBJECTIVE:  CHIEF COMPLAINT:  No chief complaint on file.  NG tube is out.  As been having bowel movements over the past few days.  Denies nausea/vomiting. Converted to NSR w/ PAC's.  Now on low-dose of amiodarone orally. Denies any significant pain but admits of some discomfort in abdomen, as well as distention, admits of burping , but continues to pass gas   Feels good today. Wants to eat more. No nausea. Has bowel movements. Diet has been advanced, TPN has been discontinued. Minimal abdominal pain with cough or hiccups REVIEW OF SYSTEMS:    Review of Systems  Constitutional: Negative for fever and chills.  HENT: Negative for congestion and tinnitus.   Eyes: Negative for blurred vision and double vision.  Respiratory: Negative for cough, shortness of breath and wheezing.   Cardiovascular: Negative for chest pain, orthopnea and PND.  Gastrointestinal: Positive for abdominal pain. Negative for nausea, vomiting and diarrhea.  Genitourinary: Negative for dysuria and hematuria.  Neurological: Positive for weakness (generalized). Negative for dizziness, sensory change and focal weakness.  All other systems reviewed and are negative.   Nutrition: NPO Tolerating Diet: No due to ileus.   Tolerating PT: Yes  DRUG ALLERGIES:   Allergies  Allergen Reactions  . No Known Allergies     VITALS:  Blood pressure 153/86, pulse 64, temperature 99.1 F (37.3 C), temperature source Oral, resp. rate 18, height 6\' 1"  (1.854 m), weight 102.6 kg (226 lb 3.1 oz), SpO2 97 %.  PHYSICAL EXAMINATION:   Physical Exam  GENERAL:  64 y.o.-year-old obese patient lying in the bed in no acute distress.  EYES: Pupils equal, round, reactive to light and accommodation. No scleral icterus. Extraocular muscles intact.  HEENT: Head atraumatic, normocephalic. Oropharynx  and nasopharynx clear.  NECK:  Supple, no jugular venous distention. No thyroid enlargement, no tenderness.  LUNGS: Diminished breath sounds bilaterally at bases, no wheezing, rales, rhonchi. No use of accessory muscles of respiration.  CARDIOVASCULAR: S1, S2 , regular. No murmurs, rubs, or gallops.  ABDOMEN: Soft, less distended.. . Bowel sounds are she improved, No organomegaly. midabdominal dressing from recent surgery with no acute drainage. EXTREMITIES: No cyanosis, clubbing, + 1 peripheral edema b/l.   NEUROLOGIC: Cranial nerves II through XII are intact. No focal Motor or sensory deficits b/l.  Globally weak. PSYCHIATRIC: The patient is alert and oriented x 3. Good affect SKIN: No obvious rash, lesion, or ulcer.    LABORATORY PANEL:   CBC  Recent Labs Lab 11/09/14 0454  WBC 5.3  HGB 13.2  HCT 39.4*  PLT 219   ------------------------------------------------------------------------------------------------------------------  Chemistries   Recent Labs Lab 11/09/14 0454  NA 134*  K 4.0  CL 102  CO2 28  GLUCOSE 109*  BUN 17  CREATININE 0.91  CALCIUM 8.7*  MG 2.1   ------------------------------------------------------------------------------------------------------------------  Cardiac Enzymes No results for input(s): TROPONINI in the last 168 hours. ------------------------------------------------------------------------------------------------------------------  RADIOLOGY:  Dg Abd 1 View  11/08/2014  CLINICAL DATA:  Postop ileus EXAM: ABDOMEN - 1 VIEW COMPARISON:  11/05/2014 FINDINGS: Persistent gaseous distended small bowel loops and proximal colon consistent with postop ileus. Postsurgical changes with midline skin staples. IMPRESSION: Persistent gaseous distended small bowel loops and proximal colon consistent with postop ileus. Electronically Signed   By: Lahoma Crocker M.D.   On: 11/08/2014 09:16   Ct Abdomen Pelvis  W Contrast  11/08/2014  CLINICAL DATA:  S/P  extensive lysis of adhesions colostomy takedown. No nausea no vomiting. There is minimal abdominal pain. He remains somewhat distended however has had 2 bowel movements in the last 24 hours and has continued to pass gas. ^14mL OMNIPAQUE IOHEXOL 300 MG/ML SOLN EXAM: CT ABDOMEN AND PELVIS WITH CONTRAST TECHNIQUE: Multidetector CT imaging of the abdomen and pelvis was performed using the standard protocol following bolus administration of intravenous contrast. CONTRAST:  111mL OMNIPAQUE IOHEXOL 300 MG/ML  SOLN COMPARISON:  CT of 09/30/2014.  Plain film of earlier today. FINDINGS: Lower chest: Bibasilar atelectasis. Normal heart size without pericardial or pleural effusion. Multivessel coronary artery atherosclerosis. Hepatobiliary: Normal liver. Normal gallbladder, without biliary ductal dilatation. Pancreas: Normal, without mass or ductal dilatation. Spleen: Normal in size, without focal abnormality. Adrenals/Urinary Tract: Left adrenal normal. Right adrenal nodule is similar in size at 1.3 cm. Too small to characterize bilateral renal lesions. An upper pole 12 mm partially calcified left renal lesion is indeterminate. There is also a 2.6 cm lower pole right renal lesion which has complexity. No hydronephrosis. Normal urinary bladder. Stomach/Bowel: Normal stomach, without wall thickening. Normal caliber of the colon, which is fluid filled. Surgical changes of partial left hemicolectomy. Interval take down of colostomy. Small bowel loops are fluid-filled. Proximally mid small bowel loops measure up to 5.0 cm within the left side of the abdomen on image/series 46/2. No focal transition point identified. No pneumatosis or free intraperitoneal air. Vascular/Lymphatic: Aortic and branch vessel atherosclerosis. Infrarenal aortic ectasia again identified at 3.4 cm. No abdominopelvic adenopathy. Reproductive: Normal prostate. Other: No significant free fluid. Tiny peripherally enhancing fluid collection within the left  pericolic gutter measures 2 x 1.7 cm, on image/series 32/2. Musculoskeletal: No acute osseous abnormality. Degenerative disc disease at the lumbosacral junction. IMPRESSION: 1. Interval left-sided colostomy takedown. Fluid-filled colon throughout, suggesting postoperative adynamic ileus. Mild small bowel distention within the right-sided abdomen is also favored to be related to ileus. No transition point to suggest obstruction. 2. Tiny fluid collection within the left side of the abdomen is suspicious for abscess. Too small to be drained. 3. Minimal motion degradation. 4. Bilateral indeterminate renal lesions. As on the prior exam, outpatient pre and post contrast abdominal MRI should be considered. 5.  Atherosclerosis, including within the coronary arteries. Electronically Signed   By: Abigail Miyamoto M.D.   On: 11/08/2014 10:59     ASSESSMENT AND PLAN:   64 year old male with past history of paroxysmal atrial fibrillation/flutter, hypertension, short of colon cancer status post colostomy, history of previous DVT, tobacco abuse who presented to the hospital for a colostomy closure and noted to be in atrial fibrillation with rapid ventricular response.  #1 . Paroxysmal atrial fibrillation with RVR-converted to NSR now.  HR stable and hemodynamically stable.  - Continue low-dose Amio due to history of paroxysmal atrial fibrillation . Discussed w/ Dr. Clayborn Bigness in the past.  - cont. Oral Cardizem, Metoprolol, discontinue Digoxin.     #2 hypertension-continue metoprolol, Cardizem. Now on advanced metoprolol dose  - Discontinue PRN Hydralazine. . Initiate patient on lisinopril if blood pressure does not improve stopping IV fluids  #3 status post colostomy closure- ileus is improving and NG tube is out. . Patient has less abdominal distension. Surgery  advance diet and TPN has been discontinued. Appreciate dietary input   #4 GERD-continue Protonix.  #5 neuropathy-continue gabapentin.  #6 hypokalemia -  resolved w/ supplementation.   Magnesium level was normal.   All the  records are reviewed and case discussed with Care Management/Social Workerr. Management plans discussed with the patient, family and they are in agreement.  CODE STATUS: Full  DVT Prophylaxis: Lovenox  TOTAL TIME TAKING CARE OF THIS PATIENT: 90minutes.   POSSIBLE D/C unclear, DEPENDING ON CLINICAL CONDITION.   Theodoro Grist M.D on 11/09/2014 at 3:15 PM  Between 7am to 6pm - Pager - [phone]  After 6pm go to [email or web address] - password [email or web address] [facility]  [place] [facility]  Office  [phone]  CC: Primary care physician; No primary care provider on file.

## 2014-11-09 NOTE — Progress Notes (Signed)
Surgery Progress Note  S:  Still distended.  + flatus, + BM.  CT results noted O:Blood pressure 153/86, pulse 64, temperature 99.1 F (37.3 C), temperature source Oral, resp. rate 18, height 6\' 1"  (1.854 m), weight 226 lb 3.1 oz (102.6 kg), SpO2 97 %. GEN: NAD/A&Ox3 ABD: soft, min tender, moderate distention  A/P 64 yo s/p ostomy reversal, still with postop ileus, anastamosis looks wide on CT - diet as tolerated - PT, ambulate

## 2014-11-09 NOTE — Progress Notes (Signed)
Nutrition Follow-up   INTERVENTION:  Meals and snacks: Cater to pt preferences, diet progressed to solid foods Medical Nutrition Supplement therapy: agree with addition of glucerna TID for added nutrition    NUTRITION DIAGNOSIS:   Inadequate oral intake related to acute illness as evidenced by NPO status.    GOAL:   Patient will meet greater than or equal to 90% of their needs  Progressing towards meeting goals  MONITOR:    (Energy Intake, Anthropometrics, Digestive System, Electrolyte/Renal Profile)  REASON FOR ASSESSMENT:   NPO/Clear Liquid Diet    ASSESSMENT:     Pt transferred to floor this am   Current Nutrition: ate 50% of breakfast this am, tolerating at this time TPN discontinued per MD order    Scheduled Medications:  . amiodarone  200 mg Oral Daily  . digoxin  0.25 mg Oral Daily  . diltiazem  30 mg Oral 4 times per day  . enoxaparin (LOVENOX) injection  40 mg Subcutaneous Q24H  . feeding supplement (GLUCERNA SHAKE)  237 mL Oral TID BM  . gabapentin  300 mg Oral TID  . insulin aspart  0-15 Units Subcutaneous 6 times per day  . metoprolol tartrate  50 mg Oral Q6H  . sodium chloride  10-40 mL Intracatheter Q12H      Electrolyte/Renal Profile and Glucose Profile:   Recent Labs Lab 11/07/14 0455 11/08/14 0434 11/09/14 0454  NA 136 138 134*  K 3.2* 3.6 4.0  CL 102 102 102  CO2 29 29 28   BUN 15 16 17   CREATININE 0.91 0.94 0.91  CALCIUM 8.7* 8.9 8.7*  MG 2.1 2.0 2.1  PHOS 4.2 4.1 4.5  GLUCOSE 107* 105* 109*    Gastrointestinal Profile: no nausea, vomiting Last BM:10/17    Diet Order:  Diet Carb Modified Fluid consistency:: Thin; Room service appropriate?: Yes  Skin:   reviewed   Height:   Ht Readings from Last 1 Encounters:  10/30/14 6\' 1"  (1.854 m)    Weight:   Wt Readings from Last 1 Encounters:  11/09/14 226 lb 3.1 oz (102.6 kg)    Ideal Body Weight:     BMI:  Body mass index is 29.85 kg/(m^2).  Estimated  Nutritional Needs:   Kcal:  4469-5072 kcals (BEE 1885, 1.3 AF, 1.1-1.2 IF)   Protein:  114-135 g (1.1-1.3 g/kg)   Fluid:  2600-3120 mL (25-30 ml/kg)   EDUCATION NEEDS:   No education needs identified at this time  LOW Care Level  James Keller B. Zenia Resides, [place], [place] (pager)

## 2014-11-09 NOTE — Progress Notes (Signed)
PARENTERAL NUTRITION CONSULT NOTE - INITIAL  Pharmacy Consult for Electrolyte Replacement  Allergies  Allergen Reactions  . No Known Allergies     Patient Measurements: Height: 6\' 1"  (185.4 cm) Weight: 226 lb 3.1 oz (102.6 kg) IBW/kg (Calculated) : 79.9  Vital Signs: BP: 135/82 mmHg (10/17 0606) Pulse Rate: 53 (10/17 0500) Intake/Output from previous day: 10/16 0701 - 10/17 0700 In: 1682.8 [I.V.:20; TPN:1662.8] Out: 1550 [ZRAQT:6226] Intake/Output from this shift: Total I/O In: 20 [I.V.:20] Out: 600 [Urine:600]  Labs:  Recent Labs  11/09/14 0454  WBC 5.3  HGB 13.2  HCT 39.4*  PLT 219     Recent Labs  11/07/14 0455 11/08/14 0434 11/09/14 0454  NA 136 138 134*  K 3.2* 3.6 4.0  CL 102 102 102  CO2 29 29 28   GLUCOSE 107* 105* 109*  BUN 15 16 17   CREATININE 0.91 0.94 0.91  CALCIUM 8.7* 8.9 8.7*  MG 2.1 2.0 2.1  PHOS 4.2 4.1 4.5   Estimated Creatinine Clearance: 104.6 mL/min (by C-G formula based on Cr of 0.91).    Recent Labs  11/08/14 2323 11/09/14 0400 11/09/14 0613  GLUCAP 112* 115* 110*    Medical History: Past Medical History  Diagnosis Date  . DVT (deep venous thrombosis) (HCC)     RIGHT LEG/ HX OF  . S/P chemotherapy, time since less than 4 weeks   . Hypertension   . 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 DVT (deep venous thrombosis) ([place])   . PAF (paroxysmal atrial fibrillation) ([place])   . Lower extremity edema     Rt leg  . Colon cancer ([place])     a. 11/2013 s/p colon resection/Hartmann's procedure;  b. Chemo: FOLFOX completed 07/2014.  . Tobacco abuse     Medications:  Scheduled:  . amiodarone  200 mg Oral Daily  . digoxin  0.25 mg Oral Daily  . diltiazem  30 mg Oral 4 times per day  . enoxaparin (LOVENOX) injection  40 mg Subcutaneous Q24H  . gabapentin  300 mg Oral TID  . insulin aspart  0-15 Units Subcutaneous 6 times per day  . metoprolol tartrate  50 mg Oral Q6H  . sodium chloride  10-40 mL  Intracatheter Q12H    Assessment: Pharmacy consulted to assist in managing electrolytes in this 64 y/o M s/p colostomy reversal with afib on TPN.   Plan:  Electrolytes are wnl this AM. Will f/u AM labs.   Ulice Dash D 11/09/2014,6:54 AM

## 2014-11-09 NOTE — Progress Notes (Signed)
Pt heart rate is jumping between 58-60. MD notified concerning the medication lopressor and cardizem. Medication was held per MD and change of order for lopressor 25mg   twice a day.  James Keller

## 2014-11-09 NOTE — Progress Notes (Signed)
Patient transferred to room 228 with tele monitor. Report called to [place], RN.

## 2014-11-09 NOTE — Progress Notes (Signed)
Subjective:   still having trouble with abdominal discomfort but slightly better. NG tube in place  Objective:  Vital Signs in the last 24 hours: Temp:  [98.3 F (36.8 C)-99.1 F (37.3 C)] 99.1 F (37.3 C) (10/17 1116) Pulse Rate:  [53-68] 64 (10/17 1116) Resp:  [15-24] 18 (10/17 1018) BP: (134-162)/(60-93) 153/86 mmHg (10/17 1303) SpO2:  [87 %-98 %] 97 % (10/17 1116) Weight:  [102.6 kg (226 lb 3.1 oz)] 102.6 kg (226 lb 3.1 oz) (10/17 0405)  Intake/Output from previous day: 10/16 0701 - 10/17 0700 In: 1682.8 [I.V.:20; TPN:1662.8] Out: 0102 [Urine:1550] Intake/Output from this shift: Total I/O In: 240 [P.O.:240] Out: 626 [Urine:625; Stool:1]  Physical Exam: General appearance: cooperative and appears stated age Neck: no adenopathy, no carotid bruit, no JVD, supple, symmetrical, trachea midline and thyroid not enlarged, symmetric, no tenderness/mass/nodules Lungs: clear to auscultation bilaterally Heart: irregularly irregular rhythm and no S3 or S4 Abdomen: abnormal findings:  distended Extremities: extremities normal, atraumatic, no cyanosis or edema Pulses: 2+ and symmetric Skin: Skin color, texture, turgor normal. No rashes or lesions Neurologic: Alert and oriented X 3, normal strength and tone. Normal symmetric reflexes. Normal coordination and gait  Lab Results:  Recent Labs  11/09/14 0454  WBC 5.3  HGB 13.2  PLT 219    Recent Labs  11/08/14 0434 11/09/14 0454  NA 138 134*  K 3.6 4.0  CL 102 102  CO2 29 28  GLUCOSE 105* 109*  BUN 16 17  CREATININE 0.94 0.91   No results for input(s): TROPONINI in the last 72 hours.  Invalid input(s): CK, MB Hepatic Function Panel No results for input(s): PROT, ALBUMIN, AST, ALT, ALKPHOS, BILITOT, BILIDIR, IBILI in the last 72 hours. No results for input(s): CHOL in the last 72 hours. No results for input(s): PROTIME in the last 72 hours.  Imaging: Imaging results have been reviewed  Cardiac  Studies:  Assessment/Plan:  Arrhythmia Atrial Fibrillation Edema Palpitations Shortness of Breath\  abdominal distention  postop from colostomy takedown  DVT prophylaxis  diabetes  hypertension . PLAN  continue postoperative care NG tube for suction  continue Lovenox for DVT prophylaxis  hypertension rate control with digoxin metoprolol as well as diltiazem  continue diabetes management with insulin control  amiodarone for rhythm control  advice diet as tolerated  LOS: 12 days    CALLWOOD,DWAYNE D. 11/09/2014, 4:02 PM

## 2014-11-09 NOTE — Progress Notes (Signed)
64 yr old POD#12 from colostomy takedown and LOA.  Patient with some postop ileus and Afib with RVR.  He is now tolerating a full liquid diet and having BMs.  He denies any nausea, vomiting or feeling of fullness for a couple days.  Patient states not having too much pain.  He says he was up and walking around in the hallways but hasnt been up in the ICU.  He was just transferred back this AM.    Filed Vitals:   11/09/14 1116  BP: 154/93  Pulse: 64  Temp: 99.1 F (37.3 C)  Resp:    PE: Gen: NAD, resting comfortably Cardiac: RRR this AM Res: CTAB/L Abd: midline incision clean and dry, 4cm middle area open with good granulation tissue, ostomy site penrose removed, minimal drainage.  Ext: 2+ pulses, no edema, good ROM  CBC Latest Ref Rng 11/09/2014 11/06/2014 11/05/2014  WBC 3.8 - 10.6 K/uL 5.3 5.8 5.2  Hemoglobin 13.0 - 18.0 g/dL 13.2 14.0 14.8  Hematocrit 40.0 - 52.0 % 39.4(L) 41.8 45.7  Platelets 150 - 440 K/uL 219 179 214   CMP Latest Ref Rng 11/09/2014 11/08/2014 11/07/2014  Glucose 65 - 99 mg/dL 109(H) 105(H) 107(H)  BUN 6 - 20 mg/dL 17 16 15   Creatinine 0.61 - 1.24 mg/dL 0.91 0.94 0.91  Sodium 135 - 145 mmol/L 134(L) 138 136  Potassium 3.5 - 5.1 mmol/L 4.0 3.6 3.2(L)  Chloride 101 - 111 mmol/L 102 102 102  CO2 22 - 32 mmol/L 28 29 29   Calcium 8.9 - 10.3 mg/dL 8.7(L) 8.9 8.7(L)  Total Protein 6.5 - 8.1 g/dL - - -  Total Bilirubin 0.3 - 1.2 mg/dL - - -  Alkaline Phos 38 - 126 U/L - - -  AST 15 - 41 U/L - - -  ALT 17 - 63 U/L - - -   Assessment/ Plan:   Advance diet to Carb controlled, add supplements, d/c TPN Start PO pain medications Encourage ambulation  Likely home tomorrow or Wednesday

## 2014-11-10 LAB — GLUCOSE, CAPILLARY
GLUCOSE-CAPILLARY: 84 mg/dL (ref 65–99)
GLUCOSE-CAPILLARY: 76 mg/dL (ref 65–99)
GLUCOSE-CAPILLARY: 97 mg/dL (ref 65–99)
Glucose-Capillary: 74 mg/dL (ref 65–99)
Glucose-Capillary: 82 mg/dL (ref 65–99)
Glucose-Capillary: 89 mg/dL (ref 65–99)

## 2014-11-10 LAB — BASIC METABOLIC PANEL
ANION GAP: 8 (ref 5–15)
BUN: 18 mg/dL (ref 6–20)
CALCIUM: 9 mg/dL (ref 8.9–10.3)
CO2: 26 mmol/L (ref 22–32)
Chloride: 102 mmol/L (ref 101–111)
Creatinine, Ser: 1.23 mg/dL (ref 0.61–1.24)
GLUCOSE: 75 mg/dL (ref 65–99)
POTASSIUM: 4.3 mmol/L (ref 3.5–5.1)
SODIUM: 136 mmol/L (ref 135–145)

## 2014-11-10 LAB — PHOSPHORUS: Phosphorus: 4.7 mg/dL — ABNORMAL HIGH (ref 2.5–4.6)

## 2014-11-10 LAB — MAGNESIUM: MAGNESIUM: 2.1 mg/dL (ref 1.7–2.4)

## 2014-11-10 NOTE — Progress Notes (Signed)
64 yr old POD#13 from colostomy takedown and LOA.  Patient tolerated regular diet well, no nausea or vomiting.  He had about 2-3 loose bowel movements. He has been up to the bathroom ok but has not walked in the hallways yet.    Filed Vitals:   11/10/14 0536  BP: 127/71  Pulse: 62  Temp: 98.9 F (37.2 C)  Resp: 18   PE: Gen: NAD, resting comfortably Cardiac: RRR this AM Res: CTAB/L Abd: midline incision clean and dry, 4cm middle area open with good granulation tissue, ostomy site, minimal drainage.  Ext: 2+ pulses, no edema, good ROM  CBC Latest Ref Rng 11/09/2014 11/06/2014 11/05/2014  WBC 3.8 - 10.6 K/uL 5.3 5.8 5.2  Hemoglobin 13.0 - 18.0 g/dL 13.2 14.0 14.8  Hematocrit 40.0 - 52.0 % 39.4(L) 41.8 45.7  Platelets 150 - 440 K/uL 219 179 214   CMP Latest Ref Rng 11/10/2014 11/09/2014 11/08/2014  Glucose 65 - 99 mg/dL 75 109(H) 105(H)  BUN 6 - 20 mg/dL 18 17 16   Creatinine 0.61 - 1.24 mg/dL 1.23 0.91 0.94  Sodium 135 - 145 mmol/L 136 134(L) 138  Potassium 3.5 - 5.1 mmol/L 4.3 4.0 3.6  Chloride 101 - 111 mmol/L 102 102 102  CO2 22 - 32 mmol/L 26 28 29   Calcium 8.9 - 10.3 mg/dL 9.0 8.7(L) 8.9  Total Protein 6.5 - 8.1 g/dL - - -  Total Bilirubin 0.3 - 1.2 mg/dL - - -  Alkaline Phos 38 - 126 U/L - - -  AST 15 - 41 U/L - - -  ALT 17 - 63 U/L - - -   Assessment/ Plan:   Continue Diet and supplements Continue  PO pain medications Teach patient and family dressing changes Encourage ambulation  Likely home tomorrow

## 2014-11-10 NOTE — Progress Notes (Signed)
Surgery Progress Note  S: No acute issues.  Feels better.  + BM, + flatus O:Blood pressure 135/83, pulse 62, temperature 98.4 F (36.9 C), temperature source Oral, resp. rate 18, height 6\' 1"  (1.854 m), weight 227 lb 12.8 oz (103.329 kg), SpO2 99 %. GEN: NAD/A&Ox3 ABD: soft, min tender, less distended  A/P 64 yo s/p colostomy reversal, doing well - diet as tolerated - dispo soon

## 2014-11-10 NOTE — Progress Notes (Deleted)
[place] at [place] NAME: James Keller    MR#:  [id number]  DATE OF BIRTH:  [birth year]  SUBJECTIVE:  CHIEF COMPLAINT:  No chief complaint on file.  NG tube is out.  As been having bowel movements over the past few days.  Denies nausea/vomiting. Converted to NSR w/ PAC's.  Now on low-dose of amiodarone orally. Denies any significant pain but admits of some discomfort in abdomen, as well as distention, admits of burping , but continues to pass gas , having bowel movements  Feels good today. Diet has been advanced. CT scan of abdomen revealed Ileus  REVIEW OF SYSTEMS:    Review of Systems  Constitutional: Negative for fever and chills.  HENT: Negative for congestion and tinnitus.   Eyes: Negative for blurred vision and double vision.  Respiratory: Negative for cough, shortness of breath and wheezing.   Cardiovascular: Negative for chest pain, orthopnea and PND.  Gastrointestinal: Positive for abdominal pain. Negative for nausea, vomiting and diarrhea.  Genitourinary: Negative for dysuria and hematuria.  Neurological: Positive for weakness (generalized). Negative for dizziness, sensory change and focal weakness.  All other systems reviewed and are negative.   Nutrition: NPO Tolerating Diet: No due to ileus.   Tolerating PT: Yes  DRUG ALLERGIES:   Allergies  Allergen Reactions  . No Known Allergies     VITALS:  Blood pressure 135/83, pulse 62, temperature 98.4 F (36.9 C), temperature source Oral, resp. rate 18, height 6\' 1"  (1.854 m), weight 103.329 kg (227 lb 12.8 oz), SpO2 99 %.  PHYSICAL EXAMINATION:   Physical Exam  GENERAL:  64 y.o.-year-old obese patient lying in the bed in no acute distress.  EYES: Pupils equal, round, reactive to light and accommodation. No scleral icterus. Extraocular muscles intact.  HEENT: Head atraumatic, normocephalic. Oropharynx and nasopharynx clear.  NECK:  Supple, no jugular venous  distention. No thyroid enlargement, no tenderness.  LUNGS: Diminished breath sounds bilaterally at bases, no wheezing, rales, rhonchi. No use of accessory muscles of respiration.  CARDIOVASCULAR: S1, S2 , regular. No murmurs, rubs, or gallops.  ABDOMEN: Soft, less distended.. . Bowel sounds are she improved, No organomegaly. midabdominal dressing from recent surgery with no acute drainage. EXTREMITIES: No cyanosis, clubbing, + 1 peripheral edema b/l.   NEUROLOGIC: Cranial nerves II through XII are intact. No focal Motor or sensory deficits b/l.  Globally weak. PSYCHIATRIC: The patient is alert and oriented x 3. Good affect SKIN: No obvious rash, lesion, or ulcer.    LABORATORY PANEL:   CBC  Recent Labs Lab 11/09/14 0454  WBC 5.3  HGB 13.2  HCT 39.4*  PLT 219   ------------------------------------------------------------------------------------------------------------------  Chemistries   Recent Labs Lab 11/10/14 0620  NA 136  K 4.3  CL 102  CO2 26  GLUCOSE 75  BUN 18  CREATININE 1.23  CALCIUM 9.0  MG 2.1   ------------------------------------------------------------------------------------------------------------------  Cardiac Enzymes No results for input(s): TROPONINI in the last 168 hours. ------------------------------------------------------------------------------------------------------------------  RADIOLOGY:  No results found.   ASSESSMENT AND PLAN:   64 year old male with past history of paroxysmal atrial fibrillation/flutter, hypertension, short of colon cancer status post colostomy, history of previous DVT, tobacco abuse who presented to the hospital for a colostomy closure and noted to be in atrial fibrillation with rapid ventricular response.  #1 . Paroxysmal atrial fibrillation with RVR-converted to NSR now.  HR stable and hemodynamically stable.  - Continue low-dose Amio due to history of paroxysmal atrial fibrillation .  Discussed w/ Dr. Clayborn Bigness  in the past.  - cont. Oral Cardizem, Metoprolol, off Digoxin.     #2 hypertension-continue metoprolol, Cardizem. Now on advanced metoprolol dose  - Discontinued PRN Hydralazine. Blood pressure is well controlled after discontinuation of IV fluids  #3 status post colostomy closure- ileus is improving and NG tube is out. . Patient has less abdominal distension. Surgery  advanced diet and TPN has been discontinued. Appreciate dietary input . Possible discharge home tomorrow if all okay, per  Patient  #4 GERD-continue Protonix.  #5 neuropathy-continue gabapentin.  #6 hypokalemia - resolved w/ supplementation.   Magnesium level was normal.   All the records are reviewed and case discussed with Care Management/Social Workerr. Management plans discussed with the patient, family and they are in agreement.  CODE STATUS: Full  DVT Prophylaxis: Lovenox  TOTAL TIME TAKING CARE OF THIS PATIENT: 19minutes.   POSSIBLE D/C unclear, DEPENDING ON CLINICAL CONDITION.   Theodoro Grist M.D on 11/10/2014 at 3:47 PM  Between 7am to 6pm - Pager - [phone]  After 6pm go to [email or web address] - password [email or web address] [facility]  [place] [facility]  Office  [phone]  CC: Primary care physician; No primary care provider on file.

## 2014-11-10 NOTE — Progress Notes (Signed)
[place] at [place] NAME: James Keller    MR#:  [id number]  DATE OF BIRTH:  [birth year]  SUBJECTIVE:  CHIEF COMPLAINT:  No chief complaint on file.  NG tube is out.  As been having bowel movements over the past few days.  Denies nausea/vomiting. Converted to NSR w/ PAC's.  Now on low-dose of amiodarone orally. Denies any significant pain but admits of some discomfort in abdomen, as well as distention, admits of burping , but continues to pass gas , having bowel movements  Feels good today. Diet has been advanced. CT scan of abdomen revealed Ileus  REVIEW OF SYSTEMS:    Review of Systems  Constitutional: Negative for fever and chills.  HENT: Negative for congestion and tinnitus.   Eyes: Negative for blurred vision and double vision.  Respiratory: Negative for cough, shortness of breath and wheezing.   Cardiovascular: Negative for chest pain, orthopnea and PND.  Gastrointestinal: Positive for abdominal pain. Negative for nausea, vomiting and diarrhea.  Genitourinary: Negative for dysuria and hematuria.  Neurological: Positive for weakness (generalized). Negative for dizziness, sensory change and focal weakness.  All other systems reviewed and are negative.   Nutrition: NPO Tolerating Diet: No due to ileus.   Tolerating PT: Yes  DRUG ALLERGIES:   Allergies  Allergen Reactions  . No Known Allergies     VITALS:  Blood pressure 135/83, pulse 62, temperature 98.4 F (36.9 C), temperature source Oral, resp. rate 18, height 6\' 1"  (1.854 m), weight 103.329 kg (227 lb 12.8 oz), SpO2 99 %.  PHYSICAL EXAMINATION:   Physical Exam  GENERAL:  64 y.o.-year-old obese patient lying in the bed in no acute distress.  EYES: Pupils equal, round, reactive to light and accommodation. No scleral icterus. Extraocular muscles intact.  HEENT: Head atraumatic, normocephalic. Oropharynx and nasopharynx clear.  NECK:  Supple, no jugular venous  distention. No thyroid enlargement, no tenderness.  LUNGS: Diminished breath sounds bilaterally at bases, no wheezing, rales, rhonchi. No use of accessory muscles of respiration.  CARDIOVASCULAR: S1, S2 , regular. No murmurs, rubs, or gallops.  ABDOMEN: Soft, less distended.. . Bowel sounds are she improved, No organomegaly. midabdominal dressing from recent surgery with no acute drainage. EXTREMITIES: No cyanosis, clubbing, + 1 peripheral edema b/l.   NEUROLOGIC: Cranial nerves II through XII are intact. No focal Motor or sensory deficits b/l.  Globally weak. PSYCHIATRIC: The patient is alert and oriented x 3. Good affect SKIN: No obvious rash, lesion, or ulcer.    LABORATORY PANEL:   CBC  Recent Labs Lab 11/09/14 0454  WBC 5.3  HGB 13.2  HCT 39.4*  PLT 219   ------------------------------------------------------------------------------------------------------------------  Chemistries   Recent Labs Lab 11/10/14 0620  NA 136  K 4.3  CL 102  CO2 26  GLUCOSE 75  BUN 18  CREATININE 1.23  CALCIUM 9.0  MG 2.1   ------------------------------------------------------------------------------------------------------------------  Cardiac Enzymes No results for input(s): TROPONINI in the last 168 hours. ------------------------------------------------------------------------------------------------------------------  RADIOLOGY:  No results found.   ASSESSMENT AND PLAN:   64 year old male with past history of paroxysmal atrial fibrillation/flutter, hypertension, short of colon cancer status post colostomy, history of previous DVT, tobacco abuse who presented to the hospital for a colostomy closure and noted to be in atrial fibrillation with rapid ventricular response.  #1 . Paroxysmal atrial fibrillation with RVR-converted to NSR now.  HR stable and hemodynamically stable.  - Patient should continue low-dose amiodarone at home due to history of  paroxysmal atrial fibrillation  . Discussed w/ Dr. Clayborn Bigness during this admission.  - cont. Oral Cardizem, Metoprolol, discontinued Digoxin.     #2 hypertension-continue metoprolol, Cardizem. Continue advanced metoprolol dose at home as well - Discontinued PRN Hydralazine. Blood pressure is well controlled after discontinuation of IV fluids  #3 status post colostomy closure- ileus is improving and NG tube is out. . Patient has less abdominal distension. Surgery  advanced diet and TPN has been discontinued. Appreciate dietary input . Possible discharge home tomorrow if all okay, per  Patient  #4 GERD-continue Protonix.  #5 neuropathy-continue gabapentin.  #6 hypokalemia - resolved w/ supplementation.   Magnesium level was normal.    We will sign off now.  Please call with any questions, thank you   All the records are reviewed and case discussed with Care Management/Social Workerr. Management plans discussed with the patient, family and they are in agreement.  CODE STATUS: Full  DVT Prophylaxis: Lovenox  TOTAL TIME TAKING CARE OF THIS PATIENT: 33minutes.   POSSIBLE D/C unclear, DEPENDING ON CLINICAL CONDITION.   Theodoro Grist M.D on 11/10/2014 at 3:49 PM  Between 7am to 6pm - Pager - [phone]  After 6pm go to [email or web address] - password [email or web address] [facility]  [place] [facility]  Office  [phone]  CC: Primary care physician; No primary care provider on file.

## 2014-11-11 LAB — GLUCOSE, CAPILLARY
GLUCOSE-CAPILLARY: 79 mg/dL (ref 65–99)
Glucose-Capillary: 79 mg/dL (ref 65–99)
Glucose-Capillary: 86 mg/dL (ref 65–99)

## 2014-11-11 MED ORDER — OXYCODONE-ACETAMINOPHEN 5-325 MG PO TABS: 1.0000 | ORAL_TABLET | ORAL | Status: DC | PRN

## 2014-11-11 MED ORDER — AMIODARONE HCL 200 MG PO TABS: 200.0000 mg | ORAL_TABLET | Freq: Every day | ORAL | Status: DC

## 2014-11-11 MED ORDER — DILTIAZEM HCL 30 MG PO TABS: 30.0000 mg | ORAL_TABLET | Freq: Four times a day (QID) | ORAL | Status: DC

## 2014-11-11 MED ORDER — DIGOXIN 250 MCG PO TABS: 0.2500 mg | ORAL_TABLET | Freq: Every day | ORAL | Status: DC

## 2014-11-11 NOTE — Progress Notes (Signed)
MD made aware of picc line being partially occluded and difficult to flush. Orders to leave [place] locked r/t probable d/c in am.

## 2014-11-11 NOTE — Care Management (Signed)
Discussed case with Dr Azalee Course surgical. Plan is for discharge today. MD would like home health for dressing changes. Spoke with the patient who is alert and oriented from home with mother and brother for support.   Is ambulatory and uses no DME.  Patient was offered choice of home health providers and stated no preference. Patient chose to go with [facility]. Refferal placed with [facility] Flock at [facility]. No other needs identified. Patient has follow ups scheduled.

## 2014-11-11 NOTE — Progress Notes (Signed)
Pt stable. PICC line removed. Drsg clean dry intact one hour after removal. Staples removed. Wet/dry drsg on abd changed. Family and patient educated on how to change drsg and assisted during drsg change with RN. HH RN will be out to visit family to help. Drsg supplies provided for the next 3 days. D/c instructions given and education provided. Prescriptions given and some electronically sent to pharm. Pt escorted out by volunteer and driven home by family.

## 2014-11-11 NOTE — Discharge Instructions (Signed)
Do not drive on pain medications Do not lift greater than 15 lbs for a period of 6 weeks Call or return to ER if you develop fever greater than 101.5, nausea/vomiting, increased pain, redness/drainage from incisions Continue twice daily wet to dry dressing changes

## 2014-11-12 ENCOUNTER — Encounter: Payer: Self-pay | Admitting: Emergency Medicine

## 2014-11-12 ENCOUNTER — Emergency Department
Admission: EM | Admit: 2014-11-12 | Discharge: 2014-11-12 | Disposition: A | Discharge status: Home / Self Care | Payer: Medicaid Other | Attending: Emergency Medicine | Admitting: Emergency Medicine

## 2014-11-12 ENCOUNTER — Emergency Department: Payer: Medicaid Other

## 2014-11-12 DIAGNOSIS — I10 Essential (primary) hypertension: Secondary | ICD-10-CM | POA: Insufficient documentation

## 2014-11-12 DIAGNOSIS — Z720 Tobacco use: Secondary | ICD-10-CM | POA: Diagnosis not present

## 2014-11-12 DIAGNOSIS — Z79899 Other long term (current) drug therapy: Secondary | ICD-10-CM | POA: Insufficient documentation

## 2014-11-12 DIAGNOSIS — M79604 Pain in right leg: Secondary | ICD-10-CM | POA: Diagnosis present

## 2014-11-12 DIAGNOSIS — I82401 Acute embolism and thrombosis of unspecified deep veins of right lower extremity: Secondary | ICD-10-CM | POA: Diagnosis not present

## 2014-11-12 LAB — PROTIME-INR
INR: 1.15
Prothrombin Time: 14.9 seconds (ref 11.4–15.0)

## 2014-11-12 LAB — CBC
HCT: 43.1 % (ref 40.0–52.0)
Hemoglobin: 14.1 g/dL (ref 13.0–18.0)
MCH: 28.5 pg (ref 26.0–34.0)
MCHC: 32.8 g/dL (ref 32.0–36.0)
MCV: 86.7 fL (ref 80.0–100.0)
PLATELETS: 278 10*3/uL (ref 150–440)
RBC: 4.97 MIL/uL (ref 4.40–5.90)
RDW: 15.8 % — AB (ref 11.5–14.5)
WBC: 5.9 10*3/uL (ref 3.8–10.6)

## 2014-11-12 LAB — BASIC METABOLIC PANEL
ANION GAP: 8 (ref 5–15)
BUN: 19 mg/dL (ref 6–20)
CALCIUM: 9.1 mg/dL (ref 8.9–10.3)
CO2: 26 mmol/L (ref 22–32)
Chloride: 101 mmol/L (ref 101–111)
Creatinine, Ser: 1.44 mg/dL — ABNORMAL HIGH (ref 0.61–1.24)
GFR calc Af Amer: 58 mL/min — ABNORMAL LOW (ref >60)
GFR, EST NON AFRICAN AMERICAN: 50 mL/min — AB (ref >60)
Glucose, Bld: 96 mg/dL (ref 65–99)
POTASSIUM: 4.1 mmol/L (ref 3.5–5.1)
SODIUM: 135 mmol/L (ref 135–145)

## 2014-11-12 LAB — APTT: APTT: 34 s (ref 24–36)

## 2014-11-12 MED ORDER — ENOXAPARIN SODIUM 100 MG/ML ~~LOC~~ SOLN
1.0000 mg/kg | Freq: Once | SUBCUTANEOUS | Status: AC
  Administered 2014-11-12: 100 mg via SUBCUTANEOUS
  Filled 2014-11-12: qty 1

## 2014-11-12 MED ORDER — ENOXAPARIN SODIUM 150 MG/ML ~~LOC~~ SOLN: 1.0000 mg/kg | Freq: Two times a day (BID) | SUBCUTANEOUS | Status: DC

## 2014-11-12 MED ORDER — WARFARIN SODIUM 5 MG PO TABS: 5.0000 mg | ORAL_TABLET | Freq: Every day | ORAL | Status: DC

## 2014-11-12 NOTE — Discharge Instructions (Signed)
Please take Lovenox twice daily, and Coumadin once daily. Please have your primary care physician follow-up your Coumadin level (INR) and discuss the length of time that you will be on blood thinners.  Please return to the emergency department if he developed chest pain, shortness of breath, fainting, increased swelling or pain in your leg, or any other symptoms concerning to you.  Deep Vein Thrombosis A deep vein thrombosis (DVT) is a blood clot (thrombus) that usually occurs in a deep, larger vein of the lower leg or the pelvis, or in an upper extremity such as the arm. These are dangerous and can lead to serious and even life-threatening complications if the clot travels to the lungs. A DVT can damage the valves in your leg veins so that instead of flowing upward, the blood pools in the lower leg. This is called post-thrombotic syndrome, and it can result in pain, swelling, discoloration, and sores on the leg. CAUSES A DVT is caused by the formation of a blood clot in your leg, pelvis, or arm. Usually, several things contribute to the formation of blood clots. A clot may develop when:  Your blood flow slows down.  Your vein becomes damaged in some way.  You have a condition that makes your blood clot more easily. RISK FACTORS A DVT is more likely to develop in:  People who are older, especially over 93 years of age.  People who are overweight (obese).  People who sit or lie still for a long time, such as during long-distance travel (over 4 hours), bed rest, hospitalization, or during recovery from certain medical conditions like a stroke.  People who do not engage in much physical activity (sedentary lifestyle).  People who have chronic breathing disorders.  People who have a personal or family history of blood clots or blood clotting disease.  People who have peripheral vascular disease (PVD), diabetes, or some types of cancer.  People who have heart disease, especially if the  person had a recent heart attack or has congestive heart failure.  People who have neurological diseases that affect the legs (leg paresis).  People who have had a traumatic injury, such as breaking a hip or leg.  People who have recently had major or lengthy surgery, especially on the hip, knee, or abdomen.  People who have had a central line placed inside a large vein.  People who take medicines that contain the hormone estrogen. These include birth control pills and hormone replacement therapy.  Pregnancy or during childbirth or the postpartum period.  Long plane flights (over 8 hours). SIGNS AND SYMPTOMS Symptoms of a DVT can include:   Swelling of your leg or arm, especially if one side is much worse.  Warmth and redness of your leg or arm, especially if one side is much worse.  Pain in your arm or leg. If the clot is in your leg, symptoms may be more noticeable or worse when you stand or walk.  A feeling of pins and needles, if the clot is in the arm. The symptoms of a DVT that has traveled to the lungs (pulmonary embolism, PE) usually start suddenly and include:  Shortness of breath while active or at rest.  Coughing or coughing up blood or blood-tinged mucus.  Chest pain that is often worse with deep breaths.  Rapid or irregular heartbeat.  Feeling light-headed or dizzy.  Fainting.  Feeling anxious.  Sweating. There may also be pain and swelling in a leg if that is where the  blood clot started. These symptoms may represent a serious problem that is an emergency. Do not wait to see if the symptoms will go away. Get medical help right away. Call your local emergency services (911 in the [place] Do not drive yourself to the hospital. DIAGNOSIS Your health care provider will take a medical history and perform a physical exam. You may also have other tests, including:  Blood tests to assess the clotting properties of your blood.  Imaging tests, such as CT,  ultrasound, MRI, X-ray, and other tests to see if you have clots anywhere in your body. TREATMENT After a DVT is identified, it can be treated. The type of treatment that you receive depends on many factors, such as the cause of your DVT, your risk for bleeding or developing more clots, and other medical conditions that you have. Sometimes, a combination of treatments is necessary. Treatment options may be combined and include:  Monitoring the blood clot with ultrasound.  Taking medicines by mouth, such as newer blood thinners (anticoagulants), thrombolytics, or warfarin.  Taking anticoagulant medicine by injection or through an IV tube.  Wearing compression stockings or using different types ofdevices.  Surgery (rare) to remove the blood clot or to place a filter in your abdomen to stop the blood clot from traveling to your lungs. Treatments for a DVT are often divided into immediate treatment and long-term treatment (up to 3 months after DVT). You can work with your health care provider to choose the treatment program that is best for you. HOME CARE INSTRUCTIONS If you are taking a newer oral anticoagulant:  Take the medicine every single day at the same time each day.  Understand what foods and drugs interact with this medicine.  Understand that there are no regular blood tests required when using this medicine.  Understand the side effects of this medicine, including excessive bruising or bleeding. Ask your health care provider or pharmacist about other possible side effects. If you are taking warfarin:  Understand how to take warfarin and know which foods can affect how warfarin works in Veterinary surgeon.  Understand that it is dangerous to take too much or too little warfarin. Too much warfarin increases the risk of bleeding. Too little warfarin continues to allow the risk for blood clots.  Follow your PT and INR blood testing schedule. The PT and INR results allow your health care  provider to adjust your dose of warfarin. It is very important that you have your PT and INR tested as often as told by your health care provider.  Avoid major changes in your diet, or tell your health care provider before you change your diet. Arrange a visit with a registered dietitian to answer your questions. Many foods, especially foods that are high in vitamin K, can interfere with warfarin and affect the PT and INR results. Eat a consistent amount of foods that are high in vitamin K, such as:  Spinach, kale, broccoli, cabbage, collard greens, turnip greens, Brussels sprouts, peas, cauliflower, seaweed, and parsley.  Beef liver and pork liver.  Green tea.  Soybean oil.  Tell your health care provider about any and all medicines, vitamins, and supplements that you take, including aspirin and other over-the-counter anti-inflammatory medicines. Be especially cautious with aspirin and anti-inflammatory medicines. Do not take those before you ask your health care provider if it is safe to do so. This is important because many medicines can interfere with warfarin and affect the PT and INR results.  Do  not start or stop taking any over-the-counter or prescription medicine unless your health care provider or pharmacist tells you to do so. If you take warfarin, you will also need to do these things:  Hold pressure over cuts for longer than usual.  Tell your dentist and other health care providers that you are taking warfarin before you have any procedures in which bleeding may occur.  Avoid alcohol or drink very small amounts. Tell your health care provider if you change your alcohol intake.  Do not use tobacco products, including cigarettes, chewing tobacco, and e-cigarettes. If you need help quitting, ask your health care provider.  Avoid contact sports. General Instructions  Take over-the-counter and prescription medicines only as told by your health care provider. Anticoagulant  medicines can have side effects, including easy bruising and difficulty stopping bleeding. If you are prescribed an anticoagulant, you will also need to do these things:  Hold pressure over cuts for longer than usual.  Tell your dentist and other health care providers that you are taking anticoagulants before you have any procedures in which bleeding may occur.  Avoid contact sports.  Wear a medical alert bracelet or carry a medical alert card that says you have had a PE.  Ask your health care provider how soon you can go back to your normal activities. Stay active to prevent new blood clots from forming.  Make sure to exercise while traveling or when you have been sitting or standing for a long period of time. It is very important to exercise. Exercise your legs by walking or by tightening and relaxing your leg muscles often. Take frequent walks.  Wear compression stockings as told by your health care provider to help prevent more blood clots from forming.  Do not use tobacco products, including cigarettes, chewing tobacco, and e-cigarettes. If you need help quitting, ask your health care provider.  Keep all follow-up appointments with your health care provider. This is important. PREVENTION Take these actions to decrease your risk of developing another DVT:  Exercise regularly. For at least 30 minutes every day, engage in:  Activity that involves moving your arms and legs.  Activity that encourages good blood flow through your body by increasing your heart rate.  Exercise your arms and legs every hour during long-distance travel (over 4 hours). Drink plenty of water and avoid drinking alcohol while traveling.  Avoid sitting or lying in bed for long periods of time without moving your legs.  Maintain a weight that is appropriate for your height. Ask your health care provider what weight is healthy for you.  If you are a woman who is over 46 years of age, avoid unnecessary use of  medicines that contain estrogen. These include birth control pills.  Do not smoke, especially if you take estrogen medicines. If you need help quitting, ask your health care provider. If you are hospitalized, prevention measures may include:  Early walking after surgery, as soon as your health care provider says that it is safe.  Receiving anticoagulants to prevent blood clots.If you cannot take anticoagulants, other options may be available, such as wearing compression stockings or using different types of devices. SEEK IMMEDIATE MEDICAL CARE IF:  You have new or increased pain, swelling, or redness in an arm or leg.  You have numbness or tingling in an arm or leg.  You have shortness of breath while active or at rest.  You have chest pain.  You have a rapid or irregular heartbeat.  You  feel light-headed or dizzy.  You cough up blood.  You notice blood in your vomit, bowel movement, or urine. These symptoms may represent a serious problem that is an emergency. Do not wait to see if the symptoms will go away. Get medical help right away. Call your local emergency services (911 in the [place] Do not drive yourself to the hospital.   This information is not intended to replace advice given to you by your health care provider. Make sure you discuss any questions you have with your health care provider.   Document Released: 01/09/2005 Document Revised: 09/30/2014 Document Reviewed: 05/06/2014 Elsevier Interactive Patient Education [employer].

## 2014-11-12 NOTE — ED Provider Notes (Addendum)
[facility] Emergency Department Provider Note  ____________________________________________  Time seen: Approximately 2:43 PM  I have reviewed the triage vital signs and the nursing notes.   HISTORY  Chief Complaint Leg Pain    HPI James Keller is a 64 y.o. male with a history of remote DVT not currently anticoagulated, status post ostomy takedown on 10/5, presenting with right lower extremity calf pain. It is worse when he wears TED hose.  Patient reports no swelling, erythema, skin changes. He denies any chest pain, shortness of breath, or syncope.   Past Medical History  Diagnosis Date  . DVT (deep venous thrombosis) (HCC)     RIGHT LEG/ HX OF  . S/P chemotherapy, time since less than 4 weeks   . Hypertension   . 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 DVT (deep venous thrombosis) ([place])   . PAF (paroxysmal atrial fibrillation) ([place])   . Lower extremity edema     Rt leg  . Colon cancer ([place])     a. 11/2013 s/p colon resection/Hartmann's procedure;  b. Chemo: FOLFOX completed 07/2014.  . Tobacco abuse     Patient Active Problem List   Diagnosis Date Noted  . Atrial fibrillation with rapid ventricular response ([place]) 11/03/2014  . Personal history of colon cancer   . Benign neoplasm of sigmoid colon   . Benign neoplasm of transverse colon   . Colon cancer ([place])   . DVT (deep venous thrombosis) ([place])   . Bradycardia 02/02/2014  . A-fib ([place]) 01/30/2014  . Essential (primary) hypertension 01/30/2014    Past Surgical History  Procedure Laterality Date  . Colon resection  12/19/2013    colostomy  . Leg surgery    . Tunneled venous port placement    . Colonoscopy with propofol N/A 09/14/2014    Procedure: COLONOSCOPY WITH PROPOFOL THROUGH COLOSTOMY AND COLONOSCOPY THRU RECTUM;  Surgeon: Lucilla Lame, MD;  Location: [facility];  Service: Endoscopy;  Laterality: N/A;  TRANSVERSE COLON POLYP AND SIGMOID COLON  POLYP  . Femur fracture surgery Right   . Port a cath injection ([facility] hx) Right   . Colostomy reversal N/A 10/28/2014    Procedure: COLOSTOMY REVERSAL;  Surgeon: Marlyce Huge, MD;  Location: [facility];  Service: General;  Laterality: N/A;  . Lysis of adhesion  10/28/2014    Procedure: LYSIS OF ADHESION;  Surgeon: Marlyce Huge, MD;  Location: [facility];  Service: General;;    Current Outpatient Rx  Name  Route  Sig  Dispense  Refill  . oxyCODONE-acetaminophen (PERCOCET/ROXICET) 5-325 MG tablet   Oral   Take 1-2 tablets by mouth every 4 (four) hours as needed for moderate pain or severe pain.   40 tablet   0   . amiodarone (PACERONE) 200 MG tablet   Oral   Take 1 tablet (200 mg total) by mouth daily.   30 tablet   2   . digoxin (LANOXIN) 0.25 MG tablet   Oral   Take 1 tablet (0.25 mg total) by mouth daily.   30 tablet   2   . diltiazem (CARDIZEM) 30 MG tablet   Oral   Take 1 tablet (30 mg total) by mouth every 6 (six) hours.   120 tablet   2   . gabapentin (NEURONTIN) 300 MG capsule   Oral   Take 1 capsule (300 mg total) by mouth 3 (three) times daily.   90 capsule   1   .  metoprolol tartrate (LOPRESSOR) 25 MG tablet   Oral   Take 25 mg by mouth 2 (two) times daily. Am and pm         . polyethylene glycol powder (GLYCOLAX/MIRALAX) powder   Oral   Take 255 g by mouth once. At 2:00 PM, start Miralax Prep (255 gram bottle).   255 g   0     Allergies No known allergies  Family History  Problem Relation Age of Onset  . Hypertension Mother     Social History Social History  Substance Use Topics  . Smoking status: Current Some Day Smoker -- 0.50 packs/day for 54 years    Types: Cigarettes  . Smokeless tobacco: Never Used  . Alcohol Use: 3.6 oz/week    6 Cans of beer per week     Comment: moderate beer    Review of Systems Constitutional: No fever/chills. No lightheadedness. No syncope. Eyes: No visual changes. ENT: No sore  throat. Cardiovascular: Denies chest pain, palpitations. Respiratory: Denies shortness of breath.  No cough. Gastrointestinal: No abdominal pain.  No nausea, no vomiting.  No diarrhea.  No constipation. Genitourinary: Negative for dysuria. Musculoskeletal: Negative for back pain. Positive for right calf pain. Skin: Negative for rash. Neurological: Negative for headaches, focal weakness or numbness.  10-point ROS otherwise negative.  ____________________________________________   PHYSICAL EXAM:  VITAL SIGNS: ED Triage Vitals  Enc Vitals Group     BP 11/12/14 1233 137/81 mmHg     Pulse Rate 11/12/14 1233 65     Resp 11/12/14 1233 18     Temp 11/12/14 1233 97.9 F (36.6 C)     Temp Source 11/12/14 1233 Oral     SpO2 11/12/14 1233 94 %     Weight 11/12/14 1233 223 lb (101.152 kg)     Height 11/12/14 1233 6\' 1"  (1.854 m)     Head Cir --      Peak Flow --      Pain Score --      Pain Loc --      Pain Edu? --      Excl. in [place]? --     Constitutional: Alert and oriented. Well appearing and in no acute distress. Answer question appropriately. Eyes: Conjunctivae are normal.  EOMI. Head: Atraumatic. Nose: No congestion/rhinnorhea. Mouth/Throat: Mucous membranes are moist.  Neck: No stridor.  Supple. Cardiovascular: Normal rate, regular rhythm. No murmurs, rubs or gallops.  Respiratory: Normal respiratory effort.  No retractions. Lungs CTAB.  No wheezes, rales or ronchi. Gastrointestinal: Abdomen is soft, nondistended and nontender. There is a large central vertical incision that has an area that is 4 cm long, packed, with mild serosanguineous drainage but no purulence, surrounding erythema, surrounding swelling. Musculoskeletal: No LE edema. No reproducible tenderness with palpation of bilateral calves. No palpable cords or Homans sign. Neurologic:  Normal speech and language. No gross focal neurologic deficits are appreciated.  Skin:  Skin is warm, dry and intact. No rash  noted. Psychiatric: Mood and affect are normal. Speech and behavior are normal.  Normal judgement.  ____________________________________________   LABS (all labs ordered are listed, but only abnormal results are displayed)  Labs Reviewed  CBC - Abnormal; Notable for the following:    RDW 15.8 (*)    All other components within normal limits  BASIC METABOLIC PANEL  APTT  PROTIME-INR   ____________________________________________  EKG  Not indicated ____________________________________________  RADIOLOGY  US Venous Img Lower Unilateral Right  11/12/2014  CLINICAL DATA:  Acute  right lower extremity pain. EXAM: Right LOWER EXTREMITY VENOUS DOPPLER ULTRASOUND TECHNIQUE: Gray-scale sonography with graded compression, as well as color Doppler and duplex ultrasound were performed to evaluate the lower extremity deep venous systems from the level of the common femoral vein and including the common femoral, femoral, profunda femoral, popliteal and calf veins including the posterior tibial, peroneal and gastrocnemius veins when visible. The superficial great saphenous vein was also interrogated. Spectral Doppler was utilized to evaluate flow at rest and with distal augmentation maneuvers in the common femoral, femoral and popliteal veins. COMPARISON:  January 07, 2014. FINDINGS: Contralateral Common Femoral Vein: Normal compressibility and flow without evidence of thrombus. Common Femoral Vein: Noncompressible without flow consistent with occlusive thrombus. Saphenofemoral Junction: Nonocclusive thrombus is noted. Profunda Femoral Vein: Nonocclusive thrombus is noted. Femoral Vein: Noncompressible without flow consistent with occlusive thrombus. Popliteal Vein: Incompressible without flow consistent with occlusive thrombus. Calf Veins: Incompressible without flow consistent with occlusive thrombus. Superficial Great Saphenous Vein: No evidence of thrombus. Normal compressibility and flow on color  Doppler imaging. Venous Reflux:  None. Other Findings:  None. IMPRESSION: Deep venous venous thrombosis is seen in the right lower extremity extending from right common femoral vein to right posterior tibial vein. Occlusive thrombus is now seen in the femoral, popliteal and posterior tibial vein which has progressed since prior exam. Critical Value/emergent results were called by telephone at the time of interpretation on 11/12/2014 at 2:40 pm to Dr. Lenise Arena , who verbally acknowledged these results. Electronically Signed   By: Marijo Conception, M.D.   On: 11/12/2014 14:41    ____________________________________________   PROCEDURES  Procedure(s) performed: None  Critical Care performed: No ____________________________________________   INITIAL IMPRESSION / ASSESSMENT AND PLAN / ED COURSE  Pertinent labs & imaging results that were available during my care of the patient were reviewed by me and considered in my medical decision making (see chart for details).  64 y.o. male with right calf pain after recent surgical procedure. Ultrasound does show a nonocclusive DVT on the right. I will plan to speak with the surgeons about anticoagulation in the setting of his recent surgery.  ----------------------------------------- 2:50 PM on 11/12/2014 -----------------------------------------  I've spoken with Dr. Heath Lark, who is on for Dr. Rexene Edison today. She says there is no contraindication to initiating anticoagulation at this time. The patient has had anticoagulation in the past, so I will give him his first dose of Lovenox here, and reinitiate Coumadin. He has mild renal insufficiency but still has a GFR>30, so he will get normal dosing.  He will follow up his primary care physician.  ____________________________________________  FINAL CLINICAL IMPRESSION(S) / ED DIAGNOSES  Final diagnoses:  DVT (deep venous thrombosis), right      NEW MEDICATIONS STARTED DURING THIS  VISIT:  New Prescriptions   No medications on file     Eula Listen, MD 11/12/14 1453  Eula Listen, MD 11/12/14 1457

## 2014-11-12 NOTE — ED Notes (Signed)
Says got out of hospital yesterday and now feels like clot in right leg

## 2014-11-13 NOTE — Discharge Summary (Signed)
Physician Discharge Summary  Patient ID: James Keller MRN: [id number] DOB/AGE: 03-05-1950 64 y.o.  Admit date: 10/28/2014 Discharge date: 11/11/2014  Admission Diagnoses:Colon CA  Discharge Diagnoses:  Active Problems:   Colon cancer [facility])   Atrial fibrillation with rapid ventricular response [facility])   Discharged Condition: good  Hospital Course: 64 yr old with history of colon CA and previous resection with ostomy came in for colostomy takedown.  HE was taken tot he OR on 10/28/2014.  He did well initially but then went into rapid afib with Rvr, he was then transferred to the ICU.  He also developed an ileus and had an NG tube for a while.  Once this resolved he was able to get back on PO medications to keep his afib under control and he was transferred back to the floor.  He was there able to ambulate well, slowly advanced to tolerating a regular diet as well.  HE did have a small portion of his wound opened for some drainage which is healing well and being packed with wet-dry gauze.   Consults: cardiology and pulmonary/intensive care  Significant Diagnostic Studies:   Treatments: IV hydration and surgery: Colostomy takedown  Discharge Exam: Blood pressure 147/88, pulse 71, temperature 98.3 F (36.8 C), temperature source Oral, resp. rate 16, height 6\' 1"  (1.854 m), weight 223 lb 4.8 oz (101.288 kg), SpO2 96 %. General appearance: alert, cooperative and no distress GI: soft, appropriately tender, staples removed, 4cm midline wound with good granulation tissue Extremities: extremities normal, atraumatic, no cyanosis or edema  Disposition: 01-Home or Self Care  Discharge Instructions    Call MD for:  persistant nausea and vomiting    Complete by:  As directed      Call MD for:  redness, tenderness, or signs of infection (pain, swelling, redness, odor or green/yellow discharge around incision site)    Complete by:  As directed      Call MD for:  severe uncontrolled pain     Complete by:  As directed      Call MD for:  temperature >100.4    Complete by:  As directed      Change dressing (specify)    Complete by:  As directed   Dressing change: 2 times per day using wet to dry gauze.     Diet - low sodium heart healthy    Complete by:  As directed      Driving Restrictions    Complete by:  As directed   No driving while on prescription pain medication     Increase activity slowly    Complete by:  As directed      Lifting restrictions    Complete by:  As directed   No lifting more than 15 lbs for 4 weeks     May shower / Bathe    Complete by:  As directed      May walk up steps    Complete by:  As directed             Medication List    STOP taking these medications        bisacodyl 5 MG EC tablet  Commonly known as:  bisacodyl     nicotine 14 mg/24hr patch  Commonly known as:  NICODERM CQ - dosed in mg/24 hours      TAKE these medications        amiodarone 200 MG tablet  Commonly known as:  PACERONE  Take 1 tablet (200  mg total) by mouth daily.     digoxin 0.25 MG tablet  Commonly known as:  LANOXIN  Take 1 tablet (0.25 mg total) by mouth daily.     diltiazem 30 MG tablet  Commonly known as:  CARDIZEM  Take 1 tablet (30 mg total) by mouth every 6 (six) hours.     gabapentin 300 MG capsule  Commonly known as:  NEURONTIN  Take 1 capsule (300 mg total) by mouth 3 (three) times daily.     metoprolol tartrate 25 MG tablet  Commonly known as:  LOPRESSOR  Take 25 mg by mouth 2 (two) times daily. Am and pm     oxyCODONE-acetaminophen 5-325 MG tablet  Commonly known as:  PERCOCET/ROXICET  Take 1-2 tablets by mouth every 4 (four) hours as needed for moderate pain or severe pain.     polyethylene glycol powder powder  Commonly known as:  GLYCOLAX/MIRALAX  Take 255 g by mouth once. At 2:00 PM, start Miralax Prep (255 gram bottle).           Follow-up Information    Follow up with Marlyce Huge, MD. Go on 11/19/2014.    Specialty:  Surgery   Why:  Thursday at 9:15am for hospital follow-up.   Contact information:   [address] [place] [address] [phone]       Follow up with Corey Skains, MD. Go on 11/18/2014.   Specialty:  Internal Medicine   Why:  Wednesday at 10:00am for hospital follow-up.   Contact information:   [address] [facility] [place] [address] [phone]       Signed: Hubbard Robinson 11/13/2014, 5:26 PM

## 2014-11-16 ENCOUNTER — Other Ambulatory Visit: Payer: Self-pay | Admitting: *Deleted

## 2014-11-16 ENCOUNTER — Encounter: Payer: Self-pay | Admitting: *Deleted

## 2014-11-18 ENCOUNTER — Telehealth: Payer: Self-pay

## 2014-11-18 NOTE — Telephone Encounter (Signed)
Left patient message regarding need for PCP to follow INR levels.

## 2014-11-18 NOTE — Telephone Encounter (Signed)
Now that he is no longer receiving chemotherapy, would prefer primary care monitor his INR levels.

## 2014-11-18 NOTE — Telephone Encounter (Signed)
-----   Message from Cephus Richer sent at 11/18/2014 11:13 AM EDT ----- Regarding: cumin Contact: [phone] Per pt is now back on cumin. Pt PCP wants to know who will monitor  It? He was place on cumin while in hospital.

## 2014-11-19 ENCOUNTER — Ambulatory Visit (INDEPENDENT_AMBULATORY_CARE_PROVIDER_SITE_OTHER): Payer: Medicaid Other | Admitting: Surgery

## 2014-11-19 ENCOUNTER — Encounter: Payer: Self-pay | Admitting: Surgery

## 2014-11-19 VITALS — BP 158/85 | HR 61 | Temp 97.6°F | Wt 221.0 lb

## 2014-11-19 DIAGNOSIS — C184 Malignant neoplasm of transverse colon: Secondary | ICD-10-CM

## 2014-11-19 NOTE — Patient Instructions (Signed)
Follow up in our office as needed.

## 2014-11-19 NOTE — Progress Notes (Signed)
Surgery Progress Note  S: Doing well.  Tolerating diet.  Having good BM.  Wound healing slowly.  No pain. O:  Blood pressure 158/85, pulse 61, temperature 97.6 F (36.4 C), temperature source Oral, weight 221 lb (100.245 kg). GEN: NAD/A&Ox3 ABD: soft, min tender, nondistended, small area of open incision with granulating tissue  A/P 64 yo s/p ostomy reversal, doing well - f/u prn - f/u with Dr. Grayland Ormond regarding kidney nodule found on MRI, need for biopsy

## 2014-11-25 ENCOUNTER — Inpatient Hospital Stay: Payer: Medicaid Other | Attending: Oncology

## 2015-01-06 ENCOUNTER — Inpatient Hospital Stay: Payer: Medicaid Other

## 2015-01-06 ENCOUNTER — Other Ambulatory Visit: Payer: Medicaid Other

## 2015-01-06 ENCOUNTER — Inpatient Hospital Stay: Payer: Medicaid Other | Attending: Oncology | Admitting: Oncology

## 2015-01-06 VITALS — BP 161/84 | HR 53 | Temp 96.9°F | Resp 20 | Wt 232.6 lb

## 2015-01-06 DIAGNOSIS — G629 Polyneuropathy, unspecified: Secondary | ICD-10-CM

## 2015-01-06 DIAGNOSIS — F17210 Nicotine dependence, cigarettes, uncomplicated: Secondary | ICD-10-CM | POA: Diagnosis not present

## 2015-01-06 DIAGNOSIS — Z86718 Personal history of other venous thrombosis and embolism: Secondary | ICD-10-CM | POA: Diagnosis not present

## 2015-01-06 DIAGNOSIS — Z79899 Other long term (current) drug therapy: Secondary | ICD-10-CM | POA: Diagnosis not present

## 2015-01-06 DIAGNOSIS — R200 Anesthesia of skin: Secondary | ICD-10-CM

## 2015-01-06 DIAGNOSIS — N289 Disorder of kidney and ureter, unspecified: Secondary | ICD-10-CM | POA: Insufficient documentation

## 2015-01-06 DIAGNOSIS — Z85038 Personal history of other malignant neoplasm of large intestine: Secondary | ICD-10-CM

## 2015-01-06 DIAGNOSIS — Z9049 Acquired absence of other specified parts of digestive tract: Secondary | ICD-10-CM

## 2015-01-06 DIAGNOSIS — C801 Malignant (primary) neoplasm, unspecified: Secondary | ICD-10-CM

## 2015-01-06 DIAGNOSIS — I480 Paroxysmal atrial fibrillation: Secondary | ICD-10-CM | POA: Diagnosis not present

## 2015-01-06 DIAGNOSIS — Z9221 Personal history of antineoplastic chemotherapy: Secondary | ICD-10-CM | POA: Insufficient documentation

## 2015-01-06 DIAGNOSIS — C189 Malignant neoplasm of colon, unspecified: Secondary | ICD-10-CM

## 2015-01-06 DIAGNOSIS — R600 Localized edema: Secondary | ICD-10-CM | POA: Diagnosis not present

## 2015-01-06 DIAGNOSIS — Z7901 Long term (current) use of anticoagulants: Secondary | ICD-10-CM

## 2015-01-06 DIAGNOSIS — N2889 Other specified disorders of kidney and ureter: Secondary | ICD-10-CM

## 2015-01-06 LAB — COMPREHENSIVE METABOLIC PANEL
ALK PHOS: 62 U/L (ref 38–126)
ALT: 25 U/L (ref 17–63)
AST: 21 U/L (ref 15–41)
Albumin: 3.9 g/dL (ref 3.5–5.0)
Anion gap: 7 (ref 5–15)
BILIRUBIN TOTAL: 1.4 mg/dL — AB (ref 0.3–1.2)
BUN: 18 mg/dL (ref 6–20)
CALCIUM: 8.7 mg/dL — AB (ref 8.9–10.3)
CO2: 25 mmol/L (ref 22–32)
CREATININE: 1.12 mg/dL (ref 0.61–1.24)
Chloride: 104 mmol/L (ref 101–111)
Glucose, Bld: 114 mg/dL — ABNORMAL HIGH (ref 65–99)
Potassium: 3.6 mmol/L (ref 3.5–5.1)
Sodium: 136 mmol/L (ref 135–145)
TOTAL PROTEIN: 6.9 g/dL (ref 6.5–8.1)

## 2015-01-06 LAB — CBC WITH DIFFERENTIAL/PLATELET
BASOS PCT: 1 %
Basophils Absolute: 0 10*3/uL (ref 0–0.1)
EOS ABS: 0.2 10*3/uL (ref 0–0.7)
Eosinophils Relative: 6 %
HCT: 38.8 % — ABNORMAL LOW (ref 40.0–52.0)
HEMOGLOBIN: 12.9 g/dL — AB (ref 13.0–18.0)
Lymphocytes Relative: 35 %
Lymphs Abs: 1.3 10*3/uL (ref 1.0–3.6)
MCH: 28.4 pg (ref 26.0–34.0)
MCHC: 33.3 g/dL (ref 32.0–36.0)
MCV: 85.3 fL (ref 80.0–100.0)
MONOS PCT: 10 %
Monocytes Absolute: 0.4 10*3/uL (ref 0.2–1.0)
NEUTROS PCT: 48 %
Neutro Abs: 1.9 10*3/uL (ref 1.4–6.5)
Platelets: 166 10*3/uL (ref 150–440)
RBC: 4.55 MIL/uL (ref 4.40–5.90)
RDW: 17.4 % — ABNORMAL HIGH (ref 11.5–14.5)
WBC: 3.8 10*3/uL (ref 3.8–10.6)

## 2015-01-06 MED ORDER — HEPARIN SOD (PORK) LOCK FLUSH 100 UNIT/ML IV SOLN
500.0000 [IU] | Freq: Once | INTRAVENOUS | Status: AC
  Administered 2015-01-06: 500 [IU] via INTRAVENOUS
  Filled 2015-01-06: qty 5

## 2015-01-06 MED ORDER — SODIUM CHLORIDE 0.9 % IJ SOLN
10.0000 mL | INTRAMUSCULAR | Status: DC | PRN
  Administered 2015-01-06: 10 mL via INTRAVENOUS
  Filled 2015-01-06: qty 10

## 2015-01-06 NOTE — Progress Notes (Signed)
Patient here today for routine follow up regarding colon cancer. Patient denies any concerns today. 

## 2015-01-07 LAB — CEA: CEA: 4.5 ng/mL (ref 0.0–4.7)

## 2015-01-21 ENCOUNTER — Other Ambulatory Visit: Payer: Self-pay | Admitting: Nurse Practitioner

## 2015-01-23 NOTE — Progress Notes (Signed)
[place]  Telephone:(336) [phone] Fax:(336) [phone]  ID: James Keller OB: 1950-10-13  MR#: [id number]  [id number]  Patient Care Team: No Pcp Per Patient as PCP - General (General Practice) Marlyce Huge, MD as Attending Physician (Surgery)  CHIEF COMPLAINT:  Chief Complaint  Patient presents with  . Colon Cancer    follow up    INTERVAL HISTORY: Patient returns to clinic today for repeat laboratory work and 3 month evaluation. He does not complain of peripheral neuropathy today. He does not complain of scrotal swelling. He denies any fevers. He has no  neurologic complaints. He has no chest pain or shortness of breath. He denies any weight loss. He denies any easy bleeding or bruising. He has no nausea, vomiting, or constipation. Patient offers no specific complaints today.   REVIEW OF SYSTEMS:   Review of Systems  Constitutional: Negative.   Respiratory: Negative.   Cardiovascular: Negative.   Gastrointestinal: Negative.  Negative for blood in stool and melena.  Genitourinary: Negative for urgency, frequency and hematuria.  Neurological: Positive for sensory change.    As per HPI. Otherwise, a complete review of systems is negatve.  PAST MEDICAL HISTORY: Past Medical History  Diagnosis Date  . DVT (deep venous thrombosis) (HCC)     RIGHT LEG/ HX OF  . S/P chemotherapy, time since less than 4 weeks   . Hypertension   . 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 DVT (deep venous thrombosis) ([place])   . PAF (paroxysmal atrial fibrillation) ([place])   . Lower extremity edema     Rt leg  . Colon cancer ([place])     a. 11/2013 s/p colon resection/Hartmann's procedure;  b. Chemo: FOLFOX completed 07/2014.  . Tobacco abuse     PAST SURGICAL HISTORY: Past Surgical History  Procedure Laterality Date  . Colon resection  12/19/2013    colostomy  . Leg surgery    . Tunneled venous port placement    . Colonoscopy with  propofol N/A 09/14/2014    Procedure: COLONOSCOPY WITH PROPOFOL THROUGH COLOSTOMY AND COLONOSCOPY THRU RECTUM;  Surgeon: Lucilla Lame, MD;  Location: [place];  Service: Endoscopy;  Laterality: N/A;  TRANSVERSE COLON POLYP AND SIGMOID COLON POLYP  . Femur fracture surgery Right   . Port a cath injection ([facility] hx) Right   . Colostomy reversal N/A 10/28/2014    Procedure: COLOSTOMY REVERSAL;  Surgeon: Marlyce Huge, MD;  Location: [facility];  Service: General;  Laterality: N/A;  . Lysis of adhesion  10/28/2014    Procedure: LYSIS OF ADHESION;  Surgeon: Marlyce Huge, MD;  Location: [facility];  Service: General;;    FAMILY HISTORY: Grandmother with "liver cancer".     ADVANCED DIRECTIVES:    HEALTH MAINTENANCE: Social History  Substance Use Topics  . Smoking status: Current Some Day Smoker -- 0.50 packs/day for 54 years    Types: Cigarettes  . Smokeless tobacco: Never Used  . Alcohol Use: 3.6 oz/week    6 Cans of beer per week     Comment: moderate beer     Colonoscopy:  PAP:  Bone density:  Lipid panel:  Allergies  Allergen Reactions  . No Known Allergies     Current Outpatient Prescriptions  Medication Sig Dispense Refill  . amiodarone (PACERONE) 200 MG tablet Take by mouth.    . digoxin (LANOXIN) 0.25 MG tablet Take by mouth.    . diltiazem (CARDIZEM) 30 MG tablet Take by mouth.    Marland Kitchen  enoxaparin (LOVENOX) 150 MG/ML injection Inject 0.67 mLs (100 mg total) into the skin every 12 (twelve) hours. 14 Syringe 0  . gabapentin (NEURONTIN) 300 MG capsule Take 1 capsule (300 mg total) by mouth 3 (three) times daily. 90 capsule 1  . metoprolol tartrate (LOPRESSOR) 25 MG tablet Take 25 mg by mouth 2 (two) times daily. Am and pm    . nicotine (RA NICOTINE) 14 mg/24hr patch Place onto the skin.    Marland Kitchen oxyCODONE-acetaminophen (PERCOCET/ROXICET) 5-325 MG tablet Take by mouth.    . polyethylene glycol powder (GLYCOLAX/MIRALAX) powder Take 255 g by mouth once. At 2:00  PM, start Miralax Prep (255 gram bottle). 255 g 0  . warfarin (COUMADIN) 5 MG tablet Take by mouth.     No current facility-administered medications for this visit.   Facility-Administered Medications Ordered in Other Visits  Medication Dose Route Frequency Provider Last Rate Last Dose  . sodium chloride 0.9 % injection 10 mL  10 mL Intracatheter PRN Lloyd Huger, MD   10 mL at 10/19/14 1353    OBJECTIVE: Filed Vitals:   01/06/15 1120  BP: 161/84  Pulse: 53  Temp: 96.9 F (36.1 C)  Resp: 20     Body mass index is 30.69 kg/(m^2).    ECOG FS:0 - Asymptomatic  General: Well-developed, well-nourished, no acute distress. Eyes: anicteric sclera. Lungs: Clear to auscultation bilaterally. Heart: Regular rate and rhythm. No rubs, murmurs, or gallops. Abdomen: Soft, nontender, nondistended. No organomegaly noted, normoactive bowel sounds. Musculoskeletal: No edema, cyanosis, or clubbing. Neuro: Alert, answering all questions appropriately. Cranial nerves grossly intact. Skin: No rashes or petechiae noted. Psych: Normal affect.    LAB RESULTS:  Lab Results  Component Value Date   NA 136 01/06/2015   K 3.6 01/06/2015   CL 104 01/06/2015   CO2 25 01/06/2015   GLUCOSE 114* 01/06/2015   BUN 18 01/06/2015   CREATININE 1.12 01/06/2015   CALCIUM 8.7* 01/06/2015   PROT 6.9 01/06/2015   ALBUMIN 3.9 01/06/2015   AST 21 01/06/2015   ALT 25 01/06/2015   ALKPHOS 62 01/06/2015   BILITOT 1.4* 01/06/2015   GFRNONAA >60 01/06/2015   GFRAA >60 01/06/2015    Lab Results  Component Value Date   WBC 3.8 01/06/2015   NEUTROABS 1.9 01/06/2015   HGB 12.9* 01/06/2015   HCT 38.8* 01/06/2015   MCV 85.3 01/06/2015   PLT 166 01/06/2015     STUDIES:    10/19/14: MRI revealed 2.7 cm lesion in the lower pole of the right kidney appears to demonstrate very low level internal enhancement after IV contrast administration. As such, neoplasm must be considered and papillary renal cell  carcinoma would be a consideration. Although papillary RCC is hypo intense on T2 weighted imaging, there are reports of overlap and some lesions with T2 hyperintensity. Given the apparent internal enhancement on subtraction series, hemorrhagic or  proteinaceous cyst is considered less likely.  No results found.  ASSESSMENT: Stage IIIB colon cancer, DVT.  PLAN:    1. Colon cancer: Patient completed 12 cycles of FOLFOX on July 29, 2014. CEA is within normal limits at 4.5 CT scan results from November 08, 2014 reviewed independently with no evidence of disease. Return to clinic in 3 months with repeat laboratory work and further evaluation. He will require a repeat colonoscopy within 6-12 months of completing his treatments.  2. H/o DVT: Continue Coumadin. Patient's INR is her monitored by primary care.  3. Elevated creatinine: Patient creatinine is now  within normal limits.  4. Neuropathy: Continue 300 mg gabapentin to take 3 times a day. 5. Renal lesion: MRI results from October 19, 2014 reported as above. Continue to monitor and consider urology referral for lesion is suspicious.  Patient expressed understanding and was in agreement with this plan. He also understands that He can call clinic at any time with any questions, concerns, or complaints.   Colon cancer   Staging form: Neuroendocrine Tumor - Colon/Rectum, AJCC 7th Edition     Clinical stage from 05/04/2014: Stage IIIB (T4, N1, M0) - Signed by Lloyd Huger, MD on 05/04/2014   Lloyd Huger, MD   01/23/2015 11:01 AM

## 2015-02-05 ENCOUNTER — Other Ambulatory Visit: Payer: Self-pay | Admitting: Surgery

## 2015-02-10 ENCOUNTER — Other Ambulatory Visit: Payer: Self-pay

## 2015-02-10 NOTE — Telephone Encounter (Signed)
Got a fax form [facility] ([email or web address]) requesting a refill of his Pacerone 200mg . It was last filled by Dr. Marlyce Huge.  Patient has never been seen by Dr. Ancil Boozer in [place] or in [facility] and does not have a future appt.

## 2015-04-05 ENCOUNTER — Ambulatory Visit
Admission: RE | Admit: 2015-04-05 | Discharge: 2015-04-05 | Disposition: A | Discharge status: Home / Self Care | Payer: Medicaid Other | Source: Ambulatory Visit | Attending: Oncology | Admitting: Oncology

## 2015-04-05 DIAGNOSIS — I714 Abdominal aortic aneurysm, without rupture: Secondary | ICD-10-CM | POA: Insufficient documentation

## 2015-04-05 DIAGNOSIS — C189 Malignant neoplasm of colon, unspecified: Secondary | ICD-10-CM

## 2015-04-05 DIAGNOSIS — N289 Disorder of kidney and ureter, unspecified: Secondary | ICD-10-CM | POA: Insufficient documentation

## 2015-04-05 MED ORDER — GADOBENATE DIMEGLUMINE 529 MG/ML IV SOLN
20.0000 mL | Freq: Once | INTRAVENOUS | Status: AC | PRN
  Administered 2015-04-05: 20 mL via INTRAVENOUS

## 2015-04-07 LAB — POCT I-STAT CREATININE: Creatinine, Ser: 1.4 mg/dL — ABNORMAL HIGH (ref 0.61–1.24)

## 2015-04-08 ENCOUNTER — Other Ambulatory Visit: Payer: Medicaid Other

## 2015-04-08 ENCOUNTER — Ambulatory Visit: Payer: Medicaid Other | Admitting: Oncology

## 2015-04-12 ENCOUNTER — Encounter: Payer: Self-pay | Admitting: *Deleted

## 2015-04-12 ENCOUNTER — Inpatient Hospital Stay (HOSPITAL_BASED_OUTPATIENT_CLINIC_OR_DEPARTMENT_OTHER): Payer: Medicaid Other | Admitting: Oncology

## 2015-04-12 ENCOUNTER — Inpatient Hospital Stay: Payer: Medicaid Other | Attending: Oncology

## 2015-04-12 VITALS — BP 161/96 | HR 98 | Temp 97.1°F | Resp 20 | Wt 244.5 lb

## 2015-04-12 DIAGNOSIS — I10 Essential (primary) hypertension: Secondary | ICD-10-CM | POA: Insufficient documentation

## 2015-04-12 DIAGNOSIS — Z85038 Personal history of other malignant neoplasm of large intestine: Secondary | ICD-10-CM

## 2015-04-12 DIAGNOSIS — I480 Paroxysmal atrial fibrillation: Secondary | ICD-10-CM

## 2015-04-12 DIAGNOSIS — I714 Abdominal aortic aneurysm, without rupture: Secondary | ICD-10-CM | POA: Diagnosis not present

## 2015-04-12 DIAGNOSIS — N2889 Other specified disorders of kidney and ureter: Secondary | ICD-10-CM | POA: Insufficient documentation

## 2015-04-12 DIAGNOSIS — Z79899 Other long term (current) drug therapy: Secondary | ICD-10-CM

## 2015-04-12 DIAGNOSIS — C185 Malignant neoplasm of splenic flexure: Secondary | ICD-10-CM

## 2015-04-12 DIAGNOSIS — F17210 Nicotine dependence, cigarettes, uncomplicated: Secondary | ICD-10-CM | POA: Diagnosis not present

## 2015-04-12 DIAGNOSIS — C189 Malignant neoplasm of colon, unspecified: Secondary | ICD-10-CM

## 2015-04-12 DIAGNOSIS — Z86718 Personal history of other venous thrombosis and embolism: Secondary | ICD-10-CM | POA: Diagnosis not present

## 2015-04-12 LAB — CBC WITH DIFFERENTIAL/PLATELET
BASOS PCT: 0 %
Basophils Absolute: 0 10*3/uL (ref 0–0.1)
Eosinophils Absolute: 0.3 10*3/uL (ref 0–0.7)
Eosinophils Relative: 7 %
HEMATOCRIT: 43.1 % (ref 40.0–52.0)
HEMOGLOBIN: 14.6 g/dL (ref 13.0–18.0)
LYMPHS ABS: 1.3 10*3/uL (ref 1.0–3.6)
LYMPHS PCT: 35 %
MCH: 29 pg (ref 26.0–34.0)
MCHC: 33.8 g/dL (ref 32.0–36.0)
MCV: 85.7 fL (ref 80.0–100.0)
MONOS PCT: 9 %
Monocytes Absolute: 0.3 10*3/uL (ref 0.2–1.0)
NEUTROS ABS: 1.8 10*3/uL (ref 1.4–6.5)
NEUTROS PCT: 49 %
Platelets: 155 10*3/uL (ref 150–440)
RBC: 5.03 MIL/uL (ref 4.40–5.90)
RDW: 15.1 % — ABNORMAL HIGH (ref 11.5–14.5)
WBC: 3.7 10*3/uL — ABNORMAL LOW (ref 3.8–10.6)

## 2015-04-12 LAB — BASIC METABOLIC PANEL
ANION GAP: 7 (ref 5–15)
BUN: 19 mg/dL (ref 6–20)
CHLORIDE: 103 mmol/L (ref 101–111)
CO2: 24 mmol/L (ref 22–32)
Calcium: 8.6 mg/dL — ABNORMAL LOW (ref 8.9–10.3)
Creatinine, Ser: 1.5 mg/dL — ABNORMAL HIGH (ref 0.61–1.24)
GFR calc Af Amer: 55 mL/min — ABNORMAL LOW (ref >60)
GFR calc non Af Amer: 48 mL/min — ABNORMAL LOW (ref >60)
Glucose, Bld: 129 mg/dL — ABNORMAL HIGH (ref 65–99)
Potassium: 3.8 mmol/L (ref 3.5–5.1)
Sodium: 134 mmol/L — ABNORMAL LOW (ref 135–145)

## 2015-04-12 NOTE — Progress Notes (Signed)
Notes faxed to Dr. Dorothey Baseman office for patient to be set up for repeat colonoscopy. Patient completed chemotherapy 07/22/14.

## 2015-04-12 NOTE — Progress Notes (Signed)
Patient here today for routine follow up regarding colon cancer, renal mass. Patient denies any concerns today.

## 2015-04-13 LAB — CEA: CEA: 5.2 ng/mL — AB (ref 0.0–4.7)

## 2015-04-15 ENCOUNTER — Other Ambulatory Visit: Payer: Medicaid Other

## 2015-04-15 ENCOUNTER — Ambulatory Visit: Payer: Medicaid Other | Admitting: Oncology

## 2015-04-25 NOTE — Progress Notes (Signed)
[place]  Telephone:(336) [phone] Fax:(336) [phone]  ID: Rada Hay OB: [birth year]  MR#: [id number]  [id number]  Patient Care Team: Herminio Commons, MD as PCP - General (Family Medicine) Marlyce Huge, MD as Attending Physician (Surgery) Lucilla Lame, MD as Consulting Physician (Gastroenterology)  CHIEF COMPLAINT:  Chief Complaint  Patient presents with  . Colon Cancer  . OTHER    follow up renal mass    INTERVAL HISTORY: Patient returns to clinic today for repeat laboratory work and 3 month evaluation. He does not complain of peripheral neuropathy today. He does not complain of scrotal swelling. He denies any fevers. He has no  neurologic complaints. He has no chest pain or shortness of breath. He denies any weight loss. He denies any easy bleeding or bruising. He has no nausea, vomiting, or constipation. Patient offers no specific complaints today.   REVIEW OF SYSTEMS:   Review of Systems  Constitutional: Negative.  Negative for fever, weight loss and malaise/fatigue.  Respiratory: Negative.  Negative for shortness of breath.   Cardiovascular: Negative.  Negative for chest pain.  Gastrointestinal: Positive for abdominal pain. Negative for blood in stool and melena.  Genitourinary: Negative for urgency, frequency and hematuria.  Musculoskeletal: Negative.   Neurological: Negative.  Negative for sensory change and weakness.    As per HPI. Otherwise, a complete review of systems is negatve.  PAST MEDICAL HISTORY: Past Medical History  Diagnosis Date  . DVT (deep venous thrombosis) (HCC)     RIGHT LEG/ HX OF  . S/P chemotherapy, time since less than 4 weeks   . Hypertension   . 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 DVT (deep venous thrombosis) ([place])   . PAF (paroxysmal atrial fibrillation) ([place])   . Lower extremity edema     Rt leg  . Colon cancer ([place])     a. 11/2013 s/p colon resection/Hartmann's  procedure;  b. Chemo: FOLFOX completed 07/2014.  . Tobacco abuse     PAST SURGICAL HISTORY: Past Surgical History  Procedure Laterality Date  . Colon resection  12/19/2013    colostomy  . Leg surgery    . Tunneled venous port placement    . Colonoscopy with propofol N/A 09/14/2014    Procedure: COLONOSCOPY WITH PROPOFOL THROUGH COLOSTOMY AND COLONOSCOPY THRU RECTUM;  Surgeon: Lucilla Lame, MD;  Location: [facility];  Service: Endoscopy;  Laterality: N/A;  TRANSVERSE COLON POLYP AND SIGMOID COLON POLYP  . Femur fracture surgery Right   . Port a cath injection ([facility] hx) Right   . Colostomy reversal N/A 10/28/2014    Procedure: COLOSTOMY REVERSAL;  Surgeon: Marlyce Huge, MD;  Location: [facility];  Service: General;  Laterality: N/A;  . Lysis of adhesion  10/28/2014    Procedure: LYSIS OF ADHESION;  Surgeon: Marlyce Huge, MD;  Location: [facility];  Service: General;;    FAMILY HISTORY: Grandmother with "liver cancer".     ADVANCED DIRECTIVES:    HEALTH MAINTENANCE: Social History  Substance Use Topics  . Smoking status: Current Some Day Smoker -- 0.50 packs/day for 54 years    Types: Cigarettes  . Smokeless tobacco: Never Used  . Alcohol Use: 3.6 oz/week    6 Cans of beer per week     Comment: moderate beer     Colonoscopy:  PAP:  Bone density:  Lipid panel:  Allergies  Allergen Reactions  . No Known Allergies     Current Outpatient Prescriptions  Medication Sig Dispense Refill  . amiodarone (PACERONE) 200 MG tablet Take by mouth.    . gabapentin (NEURONTIN) 300 MG capsule Take 1 capsule (300 mg total) by mouth 3 (three) times daily. 90 capsule 1  . metoprolol tartrate (LOPRESSOR) 25 MG tablet Take 25 mg by mouth 2 (two) times daily. Am and pm    . warfarin (COUMADIN) 5 MG tablet Take by mouth.     No current facility-administered medications for this visit.   Facility-Administered Medications Ordered in Other Visits  Medication Dose Route  Frequency Provider Last Rate Last Dose  . sodium chloride 0.9 % injection 10 mL  10 mL Intracatheter PRN Lloyd Huger, MD   10 mL at 10/19/14 1353    OBJECTIVE: Filed Vitals:   04/12/15 1135  BP: 161/96  Pulse: 98  Temp: 97.1 F (36.2 C)  Resp: 20     Body mass index is 32.26 kg/(m^2).    ECOG FS:0 - Asymptomatic  General: Well-developed, well-nourished, no acute distress. Eyes: anicteric sclera. Lungs: Clear to auscultation bilaterally. Heart: Regular rate and rhythm. No rubs, murmurs, or gallops. Abdomen: Soft, nontender, nondistended. No organomegaly noted, normoactive bowel sounds. Musculoskeletal: No edema, cyanosis, or clubbing. Neuro: Alert, answering all questions appropriately. Cranial nerves grossly intact. Skin: No rashes or petechiae noted. Psych: Normal affect.    LAB RESULTS:  Lab Results  Component Value Date   NA 134* 04/12/2015   K 3.8 04/12/2015   CL 103 04/12/2015   CO2 24 04/12/2015   GLUCOSE 129* 04/12/2015   BUN 19 04/12/2015   CREATININE 1.50* 04/12/2015   CALCIUM 8.6* 04/12/2015   PROT 6.9 01/06/2015   ALBUMIN 3.9 01/06/2015   AST 21 01/06/2015   ALT 25 01/06/2015   ALKPHOS 62 01/06/2015   BILITOT 1.4* 01/06/2015   GFRNONAA 48* 04/12/2015   GFRAA 55* 04/12/2015    Lab Results  Component Value Date   WBC 3.7* 04/12/2015   NEUTROABS 1.8 04/12/2015   HGB 14.6 04/12/2015   HCT 43.1 04/12/2015   MCV 85.7 04/12/2015   PLT 155 04/12/2015   Lab Results  Component Value Date   CEA 5.2* 04/12/2015      STUDIES:    Mr Abdomen W Wo Contrast  04/05/2015  CLINICAL DATA:  Follow-up right renal lesion, possible papillary renal cell carcinoma EXAM: MRI ABDOMEN WITHOUT AND WITH CONTRAST TECHNIQUE: Multiplanar multisequence MR imaging of the abdomen was performed both before and after the administration of intravenous contrast. CONTRAST:  26mL MULTIHANCE GADOBENATE DIMEGLUMINE 529 MG/ML IV SOLN COMPARISON:  CT abdomen pelvis dated  11/08/2014. MRI abdomen dated 10/19/2014. FINDINGS: Motion degraded images. Lower chest:  Lung bases are clear. Hepatobiliary: Liver is within normal limits. No suspicious/enhancing hepatic lesions. No hepatic steatosis. Gallbladder is unremarkable. No intrahepatic or extrahepatic ductal dilatation. Pancreas: Within normal limits. Spleen: Within normal limits. Adrenals/Urinary Tract: Adrenal glands are within normal limits. 2.3 x 2.0 x 2.5 cm posterior right lower pole renal lesion, unchanged. Intrinsic T1 hyperintensity, reflecting hemorrhage (series 10/image 66). T2 hyperintense lesion with internal complexity, including mural nodularity along the superior aspect of lesion (series 15/image 19). Subtle enhancement, particularly along the superior aspect of the lesion, best visualized on delayed postcontrast imaging (series 16/image 65). These findings remain compatible with renal neoplasm. Additional scattered bilateral renal cysts.  No hydronephrosis. Stomach/Bowel: Stomach is within normal limits. Visualized bowel is unremarkable. Vascular/Lymphatic: 3.0 x 3.5 cm infrarenal abdominal aortic aneurysm (series 15/ image 23). Single right renal artery and vein.  No renal vein invasion. No suspicious abdominal lymphadenopathy. Other: No abdominal ascites. Musculoskeletal: No focal osseous lesions. IMPRESSION: Motion degraded images. 2.5 cm posterior right lower pole renal lesion with mural nodularity and enhancement, unchanged, compatible with renal neoplasm such as renal cell carcinoma. Single right renal artery and vein. No renal vein invasion. No evidence of metastatic disease. 3.0 x 3 x 5 cm infrarenal abdominal aortic aneurysm. Electronically Signed   By: Julian Hy M.D.   On: 04/05/2015 09:48    ASSESSMENT: Stage IIIB colon cancer, DVT, renal mass.  PLAN:    1. Colon cancer: Patient completed 12 cycles of FOLFOX on July 29, 2014. CEA is mildly elevated at 5.2. CT scan results from November 08, 2014  reviewed independently with no evidence of disease. Return to clinic in 3 months with repeat laboratory work and further evaluation. He will require a repeat colonoscopy within 6-12 months of completing his treatments.  2. H/o DVT: Continue Coumadin. Patient's INR is her monitored by primary care.  3. Elevated creatinine: Patient's creatinine is mildly elevated above his baseline today, monitor.  4. Neuropathy: Patient does not complain of neuropathy today.  Continue 300 mg gabapentin to take 3 times a day. 5. Renal lesion: MRI results from April 05, 2015 reviewed independently and reported as above. Lesion is suspicious for renal cell carcinoma, but is unchanged over the last 6 months. Continue to monitor with imaging every 6 months. Patient likely require urology referral in the near future.  Patient expressed understanding and was in agreement with this plan. He also understands that He can call clinic at any time with any questions, concerns, or complaints.   Colon cancer   Staging form: Neuroendocrine Tumor - Colon/Rectum, AJCC 7th Edition     Clinical stage from 05/04/2014: Stage IIIB (T4, N1, M0) - Signed by Lloyd Huger, MD on 05/04/2014   Lloyd Huger, MD   04/25/2015 9:31 AM

## 2015-07-13 ENCOUNTER — Inpatient Hospital Stay: Payer: Medicaid Other | Admitting: Oncology

## 2015-07-13 ENCOUNTER — Inpatient Hospital Stay: Payer: Medicaid Other

## 2015-07-28 ENCOUNTER — Inpatient Hospital Stay (HOSPITAL_BASED_OUTPATIENT_CLINIC_OR_DEPARTMENT_OTHER): Payer: Medicaid Other | Admitting: Oncology

## 2015-07-28 ENCOUNTER — Telehealth: Payer: Self-pay | Admitting: Gastroenterology

## 2015-07-28 ENCOUNTER — Inpatient Hospital Stay: Payer: Medicaid Other | Attending: Oncology

## 2015-07-28 ENCOUNTER — Other Ambulatory Visit: Payer: Self-pay

## 2015-07-28 ENCOUNTER — Encounter (INDEPENDENT_AMBULATORY_CARE_PROVIDER_SITE_OTHER): Payer: Self-pay

## 2015-07-28 ENCOUNTER — Encounter: Payer: Self-pay | Admitting: *Deleted

## 2015-07-28 VITALS — BP 154/88 | HR 56 | Temp 96.0°F | Resp 18 | Wt 242.4 lb

## 2015-07-28 DIAGNOSIS — R609 Edema, unspecified: Secondary | ICD-10-CM | POA: Insufficient documentation

## 2015-07-28 DIAGNOSIS — Z9221 Personal history of antineoplastic chemotherapy: Secondary | ICD-10-CM

## 2015-07-28 DIAGNOSIS — F17210 Nicotine dependence, cigarettes, uncomplicated: Secondary | ICD-10-CM | POA: Diagnosis not present

## 2015-07-28 DIAGNOSIS — R200 Anesthesia of skin: Secondary | ICD-10-CM | POA: Diagnosis not present

## 2015-07-28 DIAGNOSIS — N2889 Other specified disorders of kidney and ureter: Secondary | ICD-10-CM | POA: Insufficient documentation

## 2015-07-28 DIAGNOSIS — R944 Abnormal results of kidney function studies: Secondary | ICD-10-CM | POA: Diagnosis not present

## 2015-07-28 DIAGNOSIS — I10 Essential (primary) hypertension: Secondary | ICD-10-CM

## 2015-07-28 DIAGNOSIS — Z7901 Long term (current) use of anticoagulants: Secondary | ICD-10-CM

## 2015-07-28 DIAGNOSIS — Z79899 Other long term (current) drug therapy: Secondary | ICD-10-CM | POA: Diagnosis not present

## 2015-07-28 DIAGNOSIS — I480 Paroxysmal atrial fibrillation: Secondary | ICD-10-CM | POA: Diagnosis not present

## 2015-07-28 DIAGNOSIS — Z86718 Personal history of other venous thrombosis and embolism: Secondary | ICD-10-CM

## 2015-07-28 DIAGNOSIS — C185 Malignant neoplasm of splenic flexure: Secondary | ICD-10-CM

## 2015-07-28 DIAGNOSIS — C186 Malignant neoplasm of descending colon: Secondary | ICD-10-CM | POA: Insufficient documentation

## 2015-07-28 DIAGNOSIS — N289 Disorder of kidney and ureter, unspecified: Secondary | ICD-10-CM

## 2015-07-28 DIAGNOSIS — C772 Secondary and unspecified malignant neoplasm of intra-abdominal lymph nodes: Secondary | ICD-10-CM

## 2015-07-28 DIAGNOSIS — Z85038 Personal history of other malignant neoplasm of large intestine: Secondary | ICD-10-CM

## 2015-07-28 LAB — BASIC METABOLIC PANEL
Anion gap: 6 (ref 5–15)
BUN: 22 mg/dL — ABNORMAL HIGH (ref 6–20)
CALCIUM: 9.1 mg/dL (ref 8.9–10.3)
CO2: 27 mmol/L (ref 22–32)
CREATININE: 1.57 mg/dL — AB (ref 0.61–1.24)
Chloride: 105 mmol/L (ref 101–111)
GFR calc non Af Amer: 45 mL/min — ABNORMAL LOW (ref >60)
GFR, EST AFRICAN AMERICAN: 52 mL/min — AB (ref >60)
Glucose, Bld: 95 mg/dL (ref 65–99)
Potassium: 3.9 mmol/L (ref 3.5–5.1)
SODIUM: 138 mmol/L (ref 135–145)

## 2015-07-28 LAB — CBC WITH DIFFERENTIAL/PLATELET
BASOS PCT: 1 %
Basophils Absolute: 0 10*3/uL (ref 0–0.1)
EOS ABS: 0.2 10*3/uL (ref 0–0.7)
EOS PCT: 3 %
HCT: 42.6 % (ref 40.0–52.0)
Hemoglobin: 14.6 g/dL (ref 13.0–18.0)
Lymphocytes Relative: 32 %
Lymphs Abs: 1.7 10*3/uL (ref 1.0–3.6)
MCH: 29.9 pg (ref 26.0–34.0)
MCHC: 34.4 g/dL (ref 32.0–36.0)
MCV: 86.9 fL (ref 80.0–100.0)
MONO ABS: 0.4 10*3/uL (ref 0.2–1.0)
MONOS PCT: 8 %
Neutro Abs: 2.9 10*3/uL (ref 1.4–6.5)
Neutrophils Relative %: 56 %
PLATELETS: 193 10*3/uL (ref 150–440)
RBC: 4.9 MIL/uL (ref 4.40–5.90)
RDW: 16.1 % — AB (ref 11.5–14.5)
WBC: 5.2 10*3/uL (ref 3.8–10.6)

## 2015-07-28 NOTE — Progress Notes (Signed)
[place]  Telephone:([phone]) [phone] [phone]) [phone]  ID: Rada Hay OB: [birth year]  MR#: [id number]  [id number]  Patient Care Team: Herminio Commons, MD as PCP - General (Family Medicine) Marlyce Huge, MD as Attending Physician (Surgery) Lucilla Lame, MD as Consulting Physician (Gastroenterology)  CHIEF COMPLAINT: Stage IIIB adenocarcinoma of the desending colon  Chief Complaint  Patient presents with  . Colon Cancer    INTERVAL HISTORY: Patient returns to clinic today for repeat laboratory work and routine 3 month evaluation. He does not complain of peripheral neuropathy today. He does not complain of scrotal swelling. He denies any fevers. He has no  neurologic complaints. He has no chest pain or shortness of breath. He denies any weight loss. He denies any easy bleeding or bruising. He has no nausea, vomiting, or constipation. Patient offers no specific complaints today.   REVIEW OF SYSTEMS:   Review of Systems  Constitutional: Negative.  Negative for fever, weight loss and malaise/fatigue.  Respiratory: Negative.  Negative for shortness of breath.   Cardiovascular: Negative.  Negative for chest pain.  Gastrointestinal: Negative for abdominal pain, blood in stool and melena.  Genitourinary: Negative for urgency, frequency and hematuria.  Musculoskeletal: Negative.   Neurological: Negative.  Negative for sensory change and weakness.    As per HPI. Otherwise, a complete review of systems is negatve.  PAST MEDICAL HISTORY: Past Medical History  Diagnosis Date  . DVT (deep venous thrombosis) (HCC)     RIGHT LEG/ HX OF  . S/P chemotherapy, time since less than 4 weeks   . Hypertension   . 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 DVT (deep venous thrombosis) ([place])   . PAF (paroxysmal atrial fibrillation) ([place])   . Lower extremity edema     Rt leg  . Colon cancer ([place])     a. 11/2013 s/p colon  resection/Hartmann's procedure;  b. Chemo: FOLFOX completed 07/2014.  . Tobacco abuse     PAST SURGICAL HISTORY: Past Surgical History  Procedure Laterality Date  . Colon resection  12/19/2013    colostomy  . Leg surgery    . Tunneled venous port placement    . Colonoscopy with propofol N/A 09/14/2014    Procedure: COLONOSCOPY WITH PROPOFOL THROUGH COLOSTOMY AND COLONOSCOPY THRU RECTUM;  Surgeon: Lucilla Lame, MD;  Location: [facility];  Service: Endoscopy;  Laterality: N/A;  TRANSVERSE COLON POLYP AND SIGMOID COLON POLYP  . Femur fracture surgery Right   . Port a cath injection ([facility] hx) Right   . Colostomy reversal N/A 10/28/2014    Procedure: COLOSTOMY REVERSAL;  Surgeon: Marlyce Huge, MD;  Location: [facility];  Service: General;  Laterality: N/A;  . Lysis of adhesion  10/28/2014    Procedure: LYSIS OF ADHESION;  Surgeon: Marlyce Huge, MD;  Location: [facility];  Service: General;;    FAMILY HISTORY: Grandmother with "liver cancer".     ADVANCED DIRECTIVES:    HEALTH MAINTENANCE: Social History  Substance Use Topics  . Smoking status: Current Some Day Smoker -- 0.50 packs/day for 54 years    Types: Cigarettes  . Smokeless tobacco: Never Used  . Alcohol Use: 3.6 oz/week    6 Cans of beer per week     Comment: moderate beer     Colonoscopy:  PAP:  Bone density:  Lipid panel:  Allergies  Allergen Reactions  . No Known Allergies     Current Outpatient Prescriptions  Medication Sig Dispense  Refill  . aluminum-magnesium hydroxide-simethicone (MAALOX) [id number] MG/5ML SUSP Take 5 mLs by mouth every 6 (six) hours as needed.    Marland Kitchen amiodarone (PACERONE) 200 MG tablet Take by mouth.    Marland Kitchen amLODipine (NORVASC) 10 MG tablet Take 1 tablet by mouth daily.    Marland Kitchen gabapentin (NEURONTIN) 300 MG capsule Take 1 capsule (300 mg total) by mouth 3 (three) times daily. 90 capsule 1  . lisinopril (PRINIVIL,ZESTRIL) 20 MG tablet Take 1 tablet by mouth daily.    .  metoprolol tartrate (LOPRESSOR) 25 MG tablet Take 25 mg by mouth 2 (two) times daily. Am and pm    . warfarin (COUMADIN) 1 MG tablet Take 1 mg by mouth daily. Take on MWF with 5mg  tablet for total dose of 6mg  on MWF    . warfarin (COUMADIN) 5 MG tablet Take 5 mg by mouth one time only at 6 PM. On TuThSaSu 5mg , on MWF 6mg      No current facility-administered medications for this visit.   Facility-Administered Medications Ordered in Other Visits  Medication Dose Route Frequency Provider Last Rate Last Dose  . sodium chloride 0.9 % injection 10 mL  10 mL Intracatheter PRN Lloyd Huger, MD   10 mL at 10/19/14 1353    OBJECTIVE: Filed Vitals:   07/28/15 1021  BP: 154/88  Pulse: 56  Temp: 96 F (35.6 C)  Resp: 18     Body mass index is 31.99 kg/(m^2).    ECOG FS:0 - Asymptomatic  General: Well-developed, well-nourished, no acute distress. Eyes: anicteric sclera. Lungs: Clear to auscultation bilaterally. Heart: Regular rate and rhythm. No rubs, murmurs, or gallops. Abdomen: Soft, nontender, nondistended. No organomegaly noted, normoactive bowel sounds. Musculoskeletal: No edema, cyanosis, or clubbing. Neuro: Alert, answering all questions appropriately. Cranial nerves grossly intact. Skin: No rashes or petechiae noted. Psych: Normal affect.    LAB RESULTS:  Lab Results  Component Value Date   NA 138 07/28/2015   K 3.9 07/28/2015   CL 105 07/28/2015   CO2 27 07/28/2015   GLUCOSE 95 07/28/2015   BUN 22* 07/28/2015   CREATININE 1.57* 07/28/2015   CALCIUM 9.1 07/28/2015   PROT 6.9 01/06/2015   ALBUMIN 3.9 01/06/2015   AST 21 01/06/2015   ALT 25 01/06/2015   ALKPHOS 62 01/06/2015   BILITOT 1.4* 01/06/2015   GFRNONAA 45* 07/28/2015   GFRAA 52* 07/28/2015    Lab Results  Component Value Date   WBC 5.2 07/28/2015   NEUTROABS 2.9 07/28/2015   HGB 14.6 07/28/2015   HCT 42.6 07/28/2015   MCV 86.9 07/28/2015   PLT 193 07/28/2015   Lab Results  Component Value Date     CEA 5.2* 04/12/2015      STUDIES:    No results found.  ASSESSMENT: Stage IIIB adenocarcinoma of the desending colon, DVT, renal mass.  PLAN:    1. Stage IIIB adenocarcinoma of the desending colon: Patient completed 12 cycles of FOLFOX on July 29, 2014. CEA is mildly elevated at 5.2, today's result is pending. CT scan results from November 08, 2014 reviewed independently with no evidence of disease. Return to clinic in 3 months with repeat laboratory work and further evaluation. He will require a repeat colonoscopy in the near future and a referral was given to GI for further evaluation.  2. H/o DVT: Continue Coumadin. Patient's INR is her monitored by primary care.  3. Elevated creatinine: Patient's creatinine is approximately his baseline, monitor.  4. Neuropathy: Patient does not complain of  neuropathy today.  Continue 300 mg gabapentin to take 3 times a day. 5. Renal lesion: MRI results from April 05, 2015 reviewed independently and reported as above. Lesion is suspicious for renal cell carcinoma, but is unchanged over the last 6 months. Will repeat MRI prior to next clinic visit.  Patient likely require urology referral in the near future.  Patient expressed understanding and was in agreement with this plan. He also understands that He can call clinic at any time with any questions, concerns, or complaints.   Colon cancer   Staging form: Colon/Rectum, AJCC 7th Edition     Pathologic stage from 05/04/2014: Stage IIIB (T4, N1, M0) - Signed by Lloyd Huger, MD on 05/04/2014   Lloyd Huger, MD   07/28/2015 12:36 PM

## 2015-07-28 NOTE — Progress Notes (Signed)
Offers no complaints  

## 2015-07-28 NOTE — Telephone Encounter (Signed)
Established patient with history of colon cancer, needs repeat colonoscopy per referral

## 2015-07-28 NOTE — Progress Notes (Unsigned)
Referral for repeat colonoscopy faxed to Dr. Allen Norris.

## 2015-07-29 LAB — CEA: CEA: 5.1 ng/mL — AB (ref 0.0–4.7)

## 2015-08-03 NOTE — Telephone Encounter (Signed)
Called and left a message with household. Patient will call me later to schedule his colonoscopy.

## 2015-08-06 ENCOUNTER — Telehealth: Payer: Self-pay

## 2015-08-06 NOTE — Telephone Encounter (Signed)
error 

## 2015-08-06 NOTE — Telephone Encounter (Signed)
Sent patient a notification letter to their address on file

## 2015-08-09 NOTE — Telephone Encounter (Signed)
Called pt. He was unavailable, no message left. Notification letter sent on 08/06/2015

## 2015-08-12 ENCOUNTER — Telehealth: Payer: Self-pay | Admitting: Gastroenterology

## 2015-08-12 ENCOUNTER — Telehealth: Payer: Self-pay

## 2015-08-12 ENCOUNTER — Other Ambulatory Visit: Payer: Self-pay

## 2015-08-12 NOTE — Telephone Encounter (Signed)
error 

## 2015-08-12 NOTE — Telephone Encounter (Signed)
Gastroenterology Pre-Procedure Review  Request Date: 09/28/2015  Requesting Physician: Dr. Gwynneth Aliment  PATIENT REVIEW QUESTIONS: The patient responded to the following health history questions as indicated:    1. Are you having any GI issues? no 2. Do you have a personal history of Polyps? yes (benign) 3. Do you have a family history of Colon Cancer or Polyps? no 4. Diabetes Mellitus? no 5. Joint replacements in the past 12 months?no 6. Major health problems in the past 3 months?no 7. Any artificial heart valves, MVP, or defibrillator?no    MEDICATIONS & ALLERGIES:    Patient reports the following regarding taking any anticoagulation/antiplatelet therapy:   Plavix, Coumadin, Eliquis, Xarelto, Lovenox, Pradaxa, Brilinta, or Effient? yes (Coumadin ) Aspirin? no  Patient confirms/reports the following medications:  Current Outpatient Prescriptions  Medication Sig Dispense Refill  . amiodarone (PACERONE) 200 MG tablet Take by mouth.    Marland Kitchen amLODipine (NORVASC) 10 MG tablet Take 1 tablet by mouth daily.    Marland Kitchen gabapentin (NEURONTIN) 300 MG capsule Take 1 capsule (300 mg total) by mouth 3 (three) times daily. 90 capsule 1  . metoprolol tartrate (LOPRESSOR) 25 MG tablet Take 25 mg by mouth 2 (two) times daily. Am and pm    . warfarin (COUMADIN) 1 MG tablet Take 1 mg by mouth daily. Take on MWF with 5mg  tablet for total dose of 6mg  on MWF    . warfarin (COUMADIN) 5 MG tablet Take 5 mg by mouth one time only at 6 PM. On TuThSaSu 5mg , on MWF 6mg     . aluminum-magnesium hydroxide-simethicone (MAALOX) 200-200-20 MG/5ML SUSP Take 5 mLs by mouth every 6 (six) hours as needed. Reported on 08/12/2015    . lisinopril (PRINIVIL,ZESTRIL) 20 MG tablet Take 1 tablet by mouth daily. Reported on 08/12/2015     No current facility-administered medications for this visit.   Facility-Administered Medications Ordered in Other Visits  Medication Dose Route Frequency Provider Last Rate Last Dose  . sodium chloride 0.9 %  injection 10 mL  10 mL Intracatheter PRN Lloyd Huger, MD   10 mL at 10/19/14 1353    Patient confirms/reports the following allergies:  Allergies  Allergen Reactions  . No Known Allergies     No orders of the defined types were placed in this encounter.    AUTHORIZATION INFORMATION Primary Insurance: 1D#: Group #:  Secondary Insurance: 1D#: Group #:  SCHEDULE INFORMATION: Date: 09/28/2015 Time: Location: [facility]

## 2015-08-12 NOTE — Telephone Encounter (Signed)
Colonoscopy If possible can you call today?

## 2015-08-13 NOTE — Telephone Encounter (Signed)
Colonoscopy scheduled 09/28/2015 [facility]

## 2015-08-25 ENCOUNTER — Telehealth: Payer: Self-pay

## 2015-08-25 NOTE — Telephone Encounter (Signed)
Received Clearance for patient to stop taking Coumadin 5 days before his procedure on 09/23/2015. He Is to resume his medication 1 day after the procedure. I spoke with he patient. He is aware and fully understands.

## 2015-09-07 ENCOUNTER — Encounter: Payer: Self-pay | Admitting: Emergency Medicine

## 2015-09-07 ENCOUNTER — Emergency Department: Payer: Medicaid Other

## 2015-09-07 ENCOUNTER — Emergency Department
Admission: EM | Admit: 2015-09-07 | Discharge: 2015-09-08 | Disposition: A | Discharge status: Home / Self Care | Payer: Medicaid Other | Attending: Emergency Medicine | Admitting: Emergency Medicine

## 2015-09-07 DIAGNOSIS — K469 Unspecified abdominal hernia without obstruction or gangrene: Secondary | ICD-10-CM | POA: Diagnosis not present

## 2015-09-07 DIAGNOSIS — F17210 Nicotine dependence, cigarettes, uncomplicated: Secondary | ICD-10-CM | POA: Diagnosis not present

## 2015-09-07 DIAGNOSIS — K5900 Constipation, unspecified: Secondary | ICD-10-CM | POA: Insufficient documentation

## 2015-09-07 DIAGNOSIS — R109 Unspecified abdominal pain: Secondary | ICD-10-CM | POA: Diagnosis present

## 2015-09-07 DIAGNOSIS — R1084 Generalized abdominal pain: Secondary | ICD-10-CM

## 2015-09-07 DIAGNOSIS — I10 Essential (primary) hypertension: Secondary | ICD-10-CM | POA: Insufficient documentation

## 2015-09-07 DIAGNOSIS — Z85038 Personal history of other malignant neoplasm of large intestine: Secondary | ICD-10-CM | POA: Diagnosis not present

## 2015-09-07 LAB — URINALYSIS COMPLETE WITH MICROSCOPIC (ARMC ONLY)
BILIRUBIN URINE: NEGATIVE
Bacteria, UA: NONE SEEN
Glucose, UA: NEGATIVE mg/dL
KETONES UR: NEGATIVE mg/dL
LEUKOCYTES UA: NEGATIVE
NITRITE: NEGATIVE
PH: 5 (ref 5.0–8.0)
Protein, ur: NEGATIVE mg/dL
RBC / HPF: NONE SEEN RBC/hpf (ref 0–5)
Specific Gravity, Urine: 1.021 (ref 1.005–1.030)

## 2015-09-07 LAB — COMPREHENSIVE METABOLIC PANEL
ALT: 22 U/L (ref 17–63)
AST: 20 U/L (ref 15–41)
Albumin: 4.2 g/dL (ref 3.5–5.0)
Alkaline Phosphatase: 51 U/L (ref 38–126)
Anion gap: 7 (ref 5–15)
BILIRUBIN TOTAL: 0.7 mg/dL (ref 0.3–1.2)
BUN: 36 mg/dL — AB (ref 6–20)
CO2: 23 mmol/L (ref 22–32)
CREATININE: 2.08 mg/dL — AB (ref 0.61–1.24)
Calcium: 9.3 mg/dL (ref 8.9–10.3)
Chloride: 109 mmol/L (ref 101–111)
GFR calc Af Amer: 37 mL/min — ABNORMAL LOW (ref >60)
GFR, EST NON AFRICAN AMERICAN: 32 mL/min — AB (ref >60)
Glucose, Bld: 89 mg/dL (ref 65–99)
Potassium: 4.1 mmol/L (ref 3.5–5.1)
Sodium: 139 mmol/L (ref 135–145)
TOTAL PROTEIN: 7.2 g/dL (ref 6.5–8.1)

## 2015-09-07 LAB — CBC
HCT: 42.3 % (ref 40.0–52.0)
Hemoglobin: 14.3 g/dL (ref 13.0–18.0)
MCH: 30.3 pg (ref 26.0–34.0)
MCHC: 33.8 g/dL (ref 32.0–36.0)
MCV: 89.5 fL (ref 80.0–100.0)
PLATELETS: 163 10*3/uL (ref 150–440)
RBC: 4.72 MIL/uL (ref 4.40–5.90)
RDW: 15.8 % — AB (ref 11.5–14.5)
WBC: 6.3 10*3/uL (ref 3.8–10.6)

## 2015-09-07 LAB — TROPONIN I: Troponin I: <0.03 ng/mL (ref <0.03)

## 2015-09-07 LAB — LIPASE, BLOOD: Lipase: 36 U/L (ref 11–51)

## 2015-09-07 MED ORDER — DIATRIZOATE MEGLUMINE & SODIUM 66-10 % PO SOLN
15.0000 mL | ORAL | Status: AC
Start: 2015-09-08 — End: 2015-09-08
  Administered 2015-09-07: 15 mL via ORAL

## 2015-09-07 NOTE — ED Notes (Signed)
Charge nurse notified of xray results; awaiting next available exam room for further evaluation

## 2015-09-07 NOTE — ED Provider Notes (Signed)
[facility] Emergency Department Provider Note   ____________________________________________   First MD Initiated Contact with Patient 09/07/15 2332     (approximate)  I have reviewed the triage vital signs and the nursing notes.   HISTORY  Chief Complaint Abdominal Pain    HPI James Keller is a 65 y.o. male who comes into the hospital today with abdominal pain. The patient reports that the pain started yesterday and it was initially at the lower part of his abdomen but now he is having pain all over his belly. The patient has not had any nausea or vomiting he has not had a bowel movement since this morning. He had a small movement this morning and reports his last bowel movement was on Sunday. The patient reports that he has a history of colon cancer normally goes to the bathroom daily. He has not taken anything for perceived constipation nor has he taken any medication for pain. The patient reports this pain is a 5-6 out of 10 in intensity. He denies any fevers and denies having a history of stomach pain in this way. His stomach looked swollen yesterday but he reports is not as bad today. He denies any chest pain, shortness of breath, headache. The patient's significant other thinks that this may be due to the fact that he hasn't had a bowel movement but the patient reports that he couldn't really tolerate the pain so he decided to come in and get checked out.   Past Medical History:  Diagnosis Date  . Colon cancer ([place])    a. 11/2013 s/p colon resection/Hartmann's procedure;  b. Chemo: FOLFOX completed 07/2014.  Marland Kitchen DVT (deep venous thrombosis) ([place])    RIGHT LEG/ HX OF  . DVT (deep venous thrombosis) ([place])   . Hypertension   . Lower extremity edema    Rt leg  . 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 PAF (paroxysmal atrial fibrillation) ([place])   . S/P chemotherapy, time since less than 4 weeks   . Tobacco abuse      Patient Active Problem List   Diagnosis Date Noted  . Cancer of descending colon metastatic to intra-abdominal lymph node ([place]) 07/28/2015  . Atrial fibrillation with rapid ventricular response ([place]) 11/03/2014  . Hypertensive left ventricular hypertrophy 10/21/2014  . Paroxysmal atrial fibrillation ([place]) 10/21/2014  . Breathlessness on exertion 10/21/2014  . Personal history of colon cancer   . Benign neoplasm of sigmoid colon   . Benign neoplasm of transverse colon   . Colon cancer ([place])   . DVT (deep venous thrombosis) ([place])   . Bradycardia 02/02/2014  . A-fib ([place]) 01/30/2014  . Essential (primary) hypertension 01/30/2014    Past Surgical History:  Procedure Laterality Date  . COLON RESECTION  12/19/2013   colostomy  . COLONOSCOPY WITH PROPOFOL N/A 09/14/2014   Procedure: COLONOSCOPY WITH PROPOFOL THROUGH COLOSTOMY AND COLONOSCOPY THRU RECTUM;  Surgeon: Lucilla Lame, MD;  Location: [facility];  Service: Endoscopy;  Laterality: N/A;  TRANSVERSE COLON POLYP AND SIGMOID COLON POLYP  . COLOSTOMY REVERSAL N/A 10/28/2014   Procedure: COLOSTOMY REVERSAL;  Surgeon: Marlyce Huge, MD;  Location: [facility];  Service: General;  Laterality: N/A;  . FEMUR FRACTURE SURGERY Right   . LEG SURGERY    . LYSIS OF ADHESION  10/28/2014   Procedure: LYSIS OF ADHESION;  Surgeon: Marlyce Huge, MD;  Location: [facility];  Service: General;;  . PORT A CATH INJECTION ([place] HX)  Right   . TUNNELED VENOUS PORT PLACEMENT      Prior to Admission medications   Medication Sig Start Date End Date Taking? Authorizing Provider  aluminum-magnesium hydroxide-simethicone (MAALOX) [id number] MG/5ML SUSP Take 5 mLs by mouth every 6 (six) hours as needed. Reported on 08/12/2015    Historical Provider, MD  amiodarone (PACERONE) 200 MG tablet Take by mouth. 11/11/14   Historical Provider, MD  amLODipine (NORVASC) 10 MG tablet Take 1 tablet by mouth daily. 06/29/15 06/28/16  Historical Provider, MD   bisacodyl (DULCOLAX) 5 MG EC tablet Take 1 tablet (5 mg total) by mouth daily as needed for moderate constipation. 09/08/15 09/07/16  Loney Hering, MD  docusate sodium (COLACE) 100 MG capsule Take 1 capsule (100 mg total) by mouth 2 (two) times daily. 09/08/15 09/07/16  Loney Hering, MD  gabapentin (NEURONTIN) 300 MG capsule Take 1 capsule (300 mg total) by mouth 3 (three) times daily. 09/07/14   Lloyd Huger, MD  lisinopril (PRINIVIL,ZESTRIL) 20 MG tablet Take 1 tablet by mouth daily. Reported on 08/12/2015    Historical Provider, MD  metoprolol tartrate (LOPRESSOR) 25 MG tablet Take 25 mg by mouth 2 (two) times daily. Am and pm 12/27/13   Historical Provider, MD  polyethylene glycol (MIRALAX) packet Take 17 g by mouth daily. 09/08/15   Loney Hering, MD  warfarin (COUMADIN) 1 MG tablet Take 1 mg by mouth daily. Take on MWF with 5mg  tablet for total dose of 6mg  on MWF    Historical Provider, MD  warfarin (COUMADIN) 5 MG tablet Take 5 mg by mouth one time only at 6 PM. On TuThSaSu 5mg , on MWF 6mg  11/12/14 11/12/15  Historical Provider, MD    Allergies No known allergies  Family History  Problem Relation Age of Onset  . Hypertension Mother     Social History Social History  Substance Use Topics  . Smoking status: Current Some Day Smoker    Packs/day: 0.50    Years: 54.00    Types: Cigarettes  . Smokeless tobacco: Never Used  . Alcohol use 3.6 oz/week    6 Cans of beer per week     Comment: moderate beer    Review of Systems Constitutional: No fever/chills Eyes: No visual changes. ENT: No sore throat. Cardiovascular: Denies chest pain. Respiratory: Denies shortness of breath. Gastrointestinal:  abdominal pain And constipation No nausea, no vomiting.  No diarrhea.   Genitourinary: Negative for dysuria. Musculoskeletal: Negative for back pain. Skin: Negative for rash. Neurological: Negative for headaches, focal weakness or numbness.  10-point ROS otherwise  negative.  ____________________________________________   PHYSICAL EXAM:  VITAL SIGNS: ED Triage Vitals [09/07/15 1958]  Enc Vitals Group     BP (!) 151/75     Pulse Rate (!) 58     Resp 18     Temp 97.7 F (36.5 C)     Temp Source Oral     SpO2 98 %     Weight 243 lb (110.2 kg)     Height 6\' 1"  (1.854 m)     Head Circumference      Peak Flow      Pain Score 4     Pain Loc      Pain Edu?      Excl. in [place]?     Constitutional: Alert and oriented. Well appearing and in mild distress. Eyes: Conjunctivae are normal. PERRL. EOMI. Head: Atraumatic. Nose: No congestion/rhinnorhea. Mouth/Throat: Mucous membranes are moist.  Oropharynx non-erythematous. Cardiovascular: Normal  rate, regular rhythm. Grossly normal heart sounds.  Good peripheral circulation. Respiratory: Normal respiratory effort.  No retractions. Lungs CTAB. Gastrointestinal: Soft with diffuse tenderness to palpation. No distention. Positive bowel sounds Musculoskeletal: No lower extremity tenderness nor edema.   Neurologic:  Normal speech and language.  Skin:  Skin is warm, dry and intact.  Psychiatric: Mood and affect are normal.   ____________________________________________   LABS (all labs ordered are listed, but only abnormal results are displayed)  Labs Reviewed  COMPREHENSIVE METABOLIC PANEL - Abnormal; Notable for the following:       Result Value   BUN 36 (*)    Creatinine, Ser 2.08 (*)    GFR calc non Af Amer 32 (*)    GFR calc Af Amer 37 (*)    All other components within normal limits  CBC - Abnormal; Notable for the following:    RDW 15.8 (*)    All other components within normal limits  URINALYSIS COMPLETEWITH MICROSCOPIC ([facility] ONLY) - Abnormal; Notable for the following:    Color, Urine YELLOW (*)    APPearance CLEAR (*)    Hgb urine dipstick 1+ (*)    Squamous Epithelial / LPF 0-5 (*)    All other components within normal limits  LIPASE, BLOOD  TROPONIN I  PROTIME-INR    ____________________________________________  EKG  ED ECG REPORT I, Loney Hering, the attending physician, personally viewed and interpreted this ECG.   Date: 09/07/2015  EKG Time: 20075  Rate: 58  Rhythm: sinus bradycardia  Axis: normal  Intervals:none  ST&T Change: Biphasic T wave in leads V3, V4, V5.  ____________________________________________  RADIOLOGY  Abdomen Xray: Diffuse gaseous small bowel distention with diffuse moderate stool burden. Imaging features could be related ileus although evolving obstruction cannot be entirely excluded  CT abdomen and pelvis ____________________________________________   PROCEDURES  Procedure(s) performed: None  Procedures  Critical Care performed: No  ____________________________________________   INITIAL IMPRESSION / ASSESSMENT AND PLAN / ED COURSE  Pertinent labs & imaging results that were available during my care of the patient were reviewed by me and considered in my medical decision making (see chart for details).  This is a 65 year old male who comes to the hospital today with some abdominal pain and some constipation. The patient does have a history of abdominal surgery as well as colon cancer. I will send the patient for a CT scan of his abdomen. He did have an x-ray that looked like there was some dilated loops of bowel with an ileus versus early obstruction. I will reassess the patient once he is received the CT scan.  Clinical Course  Value Comment By Time  CT Abdomen Pelvis Wo Contrast 1. No significant bowel dilatation or evidence for focal obstruction. Periumbilical and left upper quadrants small abdominal wall hernias containing side loops of small bowel without apparent functional obstruction. 2. Infrarenal abdominal aortic aneurysm measuring up to 4.2 cm is stable to minimally increased in comparison with the 2016 CT of abdomen and pelvis. Abdominal aortic atherosclerosis. 3. Right kidney lower  pole mass is not clearly appreciated without intravenous contrast and follow-up with MRI or contrast CT is recommended. Loney Hering, MD 08/16 [phone]   The patient was sleeping without a more severe distress. The patient's CT scan does not show any bowel obstruction. He does have 2 small hernias but without signs of obstruction. The patient's blood work is also unremarkable. I will discharge the patient to home and have encouraged him to  follow-up with his surgeon. Should the hernias develop into worse problems he may need to have them repaired. I will give the patient some medication for constipation and have him again in follow-up. The patient understands and agrees with this plan. He'll be discharged home.  ____________________________________________   FINAL CLINICAL IMPRESSION(S) / ED DIAGNOSES  Final diagnoses:  Generalized abdominal pain  Constipation, unspecified constipation type  Abdominal hernia without obstruction and without gangrene, recurrence not specified, unspecified hernia type      NEW MEDICATIONS STARTED DURING THIS VISIT:  New Prescriptions   BISACODYL (DULCOLAX) 5 MG EC TABLET    Take 1 tablet (5 mg total) by mouth daily as needed for moderate constipation.   DOCUSATE SODIUM (COLACE) 100 MG CAPSULE    Take 1 capsule (100 mg total) by mouth 2 (two) times daily.   POLYETHYLENE GLYCOL (MIRALAX) PACKET    Take 17 g by mouth daily.     Note:  This document was prepared using Dragon voice recognition software and may include unintentional dictation errors.    Loney Hering, MD 09/08/15 [phone]

## 2015-09-07 NOTE — ED Triage Notes (Signed)
Patient ambulatory to triage with steady gait, without difficulty or distress noted; pt reports generalized abd pain & swelling with hard stool this am; hx colon CA with colostomy

## 2015-09-08 ENCOUNTER — Emergency Department: Payer: Medicaid Other

## 2015-09-08 LAB — PROTIME-INR
INR: 1.07
Prothrombin Time: 13.9 seconds (ref 11.4–15.2)

## 2015-09-08 MED ORDER — DOCUSATE SODIUM 100 MG PO CAPS: 100.0000 mg | ORAL_CAPSULE | Freq: Two times a day (BID) | ORAL | 0 refills | Status: DC

## 2015-09-08 MED ORDER — BISACODYL 5 MG PO TBEC: 5.0000 mg | DELAYED_RELEASE_TABLET | Freq: Every day | ORAL | 0 refills | Status: DC | PRN

## 2015-09-08 MED ORDER — POLYETHYLENE GLYCOL 3350 17 G PO PACK: 17.0000 g | PACK | Freq: Every day | ORAL | 0 refills | Status: DC

## 2015-09-08 NOTE — ED Notes (Signed)
Pt. S.O. In the room requesting update. MD to be made aware.

## 2015-09-08 NOTE — ED Notes (Signed)
Pt. Completed oral contrast, CT contacted that pt. Is ready. Pt. Comfortable in room at this time and denies complaints.

## 2015-09-08 NOTE — ED Notes (Signed)
Patient discharged to home per MD order. Patient in stable condition, and deemed medically cleared by ED provider for discharge. Discharge instructions reviewed with patient/family using "Teach Back"; verbalized understanding of medication education and administration, and information about follow-up care. Denies further concerns. ° °

## 2015-09-15 ENCOUNTER — Ambulatory Visit (INDEPENDENT_AMBULATORY_CARE_PROVIDER_SITE_OTHER): Payer: Medicaid Other | Admitting: General Surgery

## 2015-09-15 ENCOUNTER — Telehealth: Payer: Self-pay

## 2015-09-15 ENCOUNTER — Encounter: Payer: Self-pay | Admitting: General Surgery

## 2015-09-15 VITALS — BP 148/81 | HR 50 | Temp 97.6°F | Ht 73.0 in | Wt 244.0 lb

## 2015-09-15 DIAGNOSIS — K432 Incisional hernia without obstruction or gangrene: Secondary | ICD-10-CM | POA: Diagnosis not present

## 2015-09-15 NOTE — Patient Instructions (Addendum)
We would like for you to have your Colonoscopy that is scheduled on 09/28/15 and your MRI that is scheduled on 10/28/15 and your follow up appointment with Dr.Finnegan on 11/01/15 before proceeding with your surgery to fix the hernia's that you have.   Please see your appointments listed below.  We have placed you on the schedule for 11/10/15 with Dr.Woodham. Please call our office if you have any questions.

## 2015-09-15 NOTE — Progress Notes (Signed)
Outpatient Surgical Follow Up  09/15/2015  James Keller is an 65 y.o. male.   Chief Complaint  Patient presents with  . Other    Established patient- abdominal pain-Abdominal hernia w/o obstruction-Dr.[id number]    HPI: 65 year old male that swell established with surgery department returns to clinic for follow-up of abdominal pain. He was seen in emergency department for this recently and was diagnosed with abdominal wall hernias. He has a significant history of colon cancer in his 1 year removed from his colostomy takedown and completion of his chemotherapy. He is still being followed by oncology for his tumor markers as well as for a suspicious renal mass. Patient states that his abdominal pain came on suddenly and started in his lower abdomen to the left side and then progressed to covering his entire abdomen prompting to the emergency part. He denies any fevers, chills, nausea, vomiting, diarrhea, constipation, chest pain, shortness of breath. The pain resolve all emergency department and has not recurred but he has remained tender. He has not noticed any new bulges or changes in the size of his abdomen. He is also due for his annual colonoscopy which is set up to performed in 2 weeks.  Past Medical History:  Diagnosis Date  . Colon cancer ([place])    a. 11/2013 s/p colon resection/Hartmann's procedure;  b. Chemo: FOLFOX completed 07/2014.  Marland Kitchen DVT (deep venous thrombosis) ([place])    RIGHT LEG/ HX OF  . DVT (deep venous thrombosis) ([place])   . Hypertension   . Lower extremity edema    Rt leg  . 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 PAF (paroxysmal atrial fibrillation) ([place])   . S/P chemotherapy, time since less than 4 weeks   . Tobacco abuse     Past Surgical History:  Procedure Laterality Date  . COLON RESECTION  12/19/2013   colostomy  . COLONOSCOPY WITH PROPOFOL N/A 09/14/2014   Procedure: COLONOSCOPY WITH PROPOFOL THROUGH COLOSTOMY AND COLONOSCOPY  THRU RECTUM;  Surgeon: Lucilla Lame, MD;  Location: [facility];  Service: Endoscopy;  Laterality: N/A;  TRANSVERSE COLON POLYP AND SIGMOID COLON POLYP  . COLOSTOMY REVERSAL N/A 10/28/2014   Procedure: COLOSTOMY REVERSAL;  Surgeon: Marlyce Huge, MD;  Location: [facility];  Service: General;  Laterality: N/A;  . FEMUR FRACTURE SURGERY Right   . LEG SURGERY    . LYSIS OF ADHESION  10/28/2014   Procedure: LYSIS OF ADHESION;  Surgeon: Marlyce Huge, MD;  Location: [facility];  Service: General;;  . PORT A CATH INJECTION ([place] HX) Right   . TUNNELED VENOUS PORT PLACEMENT      Family History  Problem Relation Age of Onset  . Hypertension Mother     Social History:  reports that he has been smoking Cigarettes.  He has a 27.00 pack-year smoking history. He has never used smokeless tobacco. He reports that he drinks about 3.6 oz of alcohol per week . He reports that he uses drugs, including Marijuana.  Allergies:  Allergies  Allergen Reactions  . No Known Allergies     Medications reviewed.    ROS A multipoint review of systems was completed, all pertinent positives and negatives are documented within the history of present illness and remainder are negative.   BP (!) 148/81 (BP Location: Right Arm, Patient Position: Sitting, Cuff Size: Normal)   Pulse (!) 50   Temp 97.6 F (36.4 C) (Oral)   Ht 6\' 1"  (1.854 m)   Wt 110.7  kg (244 lb)   BMI 32.19 kg/m   Physical Exam Gen.: No acute distress Neck: Supple and nontender Chest: Clear to auscultation and without use of accessory muscles Heart: Regular rate and rhythm Abdomen: Soft, nondistended. Well healed midline and left upper quadrant colostomy incision sites. Mild tenderness to deep palpation below the colostomy site as well as at the umbilicus with a visible umbilical hernia next to his midline incision but no visible hernia at his prior colostomy site. There is no rebound or guarding. Extremity: Moves all  extremities well, no evidence of edema or cyanosis    No results found for this or any previous visit (from the past 48 hour(s)). No results found.  Assessment/Plan:  1. Incisional hernia, without obstruction or gangrene 65 year old male with what appears to now be symptomatic incisional hernias from his prior surgeries. This is likely an exacerbation of his chronic problem as these hernias probably been there for the last several months. Discussed at length that although he does have hernias that can be fixed he is due for follow-up colonoscopy from his colon cancer and a repeat MRI of his renal mass. Discussed that it would be unwise to proceed with a hernia repair with pending workup that could require additional abdominal surgery that would likely destroy the hernia repair. Patient and his family voiced understanding of this and are willing to continue workup of his previously diagnosed problems. Discussed at length the signs and symptoms of incarcerated or strangulate hernias and to report to the emergency room immediately should they occur. He voiced understanding. Otherwise, he'll follow up in clinic after he has completed his colonoscopy and abdominal MRI which is currently scheduled for October.  A total of 25 minutes was spent on this encounter with greater than 50% of it being used for counseling and/or coordination of care.   Clayburn Pert, MD FACS General Surgeon  09/15/2015,3:09 PM

## 2015-09-15 NOTE — Telephone Encounter (Signed)
Patient notified of Appointment 11/10/15 @ 8:45 with Dr.Woodham. Patient verbalized understanding.

## 2015-09-28 ENCOUNTER — Other Ambulatory Visit: Payer: Self-pay

## 2015-09-28 ENCOUNTER — Ambulatory Visit
Admission: RE | Admit: 2015-09-28 | Discharge: 2015-09-28 | Disposition: A | Discharge status: Home / Self Care | Payer: Medicaid Other | Source: Ambulatory Visit | Attending: Gastroenterology | Admitting: Gastroenterology

## 2015-09-28 ENCOUNTER — Telehealth: Payer: Self-pay | Admitting: Gastroenterology

## 2015-09-28 ENCOUNTER — Encounter: Payer: Self-pay | Admitting: *Deleted

## 2015-09-28 ENCOUNTER — Encounter
Admission: RE | Disposition: A | Discharge status: Home / Self Care | Payer: Self-pay | Source: Ambulatory Visit | Attending: Gastroenterology

## 2015-09-28 DIAGNOSIS — Z539 Procedure and treatment not carried out, unspecified reason: Secondary | ICD-10-CM | POA: Insufficient documentation

## 2015-09-28 HISTORY — DX: Cardiac arrhythmia, unspecified: I49.9

## 2015-09-28 SURGERY — COLONOSCOPY WITH PROPOFOL
Anesthesia: General

## 2015-09-28 MED ORDER — SODIUM CHLORIDE 0.9 % IV SOLN: INTRAVENOUS | Status: DC

## 2015-09-28 MED ORDER — NA SULFATE-K SULFATE-MG SULF 17.5-3.13-1.6 GM/177ML PO SOLN
1.0000 | Freq: Once | ORAL | 0 refills | Status: AC
Start: 2015-09-28 — End: 2015-09-28

## 2015-09-28 NOTE — Telephone Encounter (Signed)
Please call patient to reschedule colonoscopy.  Colonoscopy was scheduled for today (9/5) but was cancelled due to patient eating last night before procedure.

## 2015-09-28 NOTE — Telephone Encounter (Signed)
Rescheduled patient to 11/23/2015. Orders will be put in. I sent medication to patients pharmacy

## 2015-10-27 ENCOUNTER — Other Ambulatory Visit: Payer: Self-pay | Admitting: *Deleted

## 2015-10-27 DIAGNOSIS — C189 Malignant neoplasm of colon, unspecified: Secondary | ICD-10-CM

## 2015-10-28 ENCOUNTER — Inpatient Hospital Stay: Payer: Medicaid Other | Attending: Oncology

## 2015-10-28 ENCOUNTER — Ambulatory Visit
Admission: RE | Admit: 2015-10-28 | Discharge: 2015-10-28 | Disposition: A | Discharge status: Home / Self Care | Payer: Medicaid Other | Source: Ambulatory Visit | Attending: Oncology | Admitting: Oncology

## 2015-10-28 DIAGNOSIS — K760 Fatty (change of) liver, not elsewhere classified: Secondary | ICD-10-CM | POA: Diagnosis not present

## 2015-10-28 DIAGNOSIS — N2889 Other specified disorders of kidney and ureter: Secondary | ICD-10-CM | POA: Diagnosis not present

## 2015-10-28 DIAGNOSIS — F17210 Nicotine dependence, cigarettes, uncomplicated: Secondary | ICD-10-CM | POA: Diagnosis not present

## 2015-10-28 DIAGNOSIS — K469 Unspecified abdominal hernia without obstruction or gangrene: Secondary | ICD-10-CM | POA: Insufficient documentation

## 2015-10-28 DIAGNOSIS — I714 Abdominal aortic aneurysm, without rupture: Secondary | ICD-10-CM | POA: Insufficient documentation

## 2015-10-28 DIAGNOSIS — Z9221 Personal history of antineoplastic chemotherapy: Secondary | ICD-10-CM | POA: Diagnosis not present

## 2015-10-28 DIAGNOSIS — R944 Abnormal results of kidney function studies: Secondary | ICD-10-CM | POA: Diagnosis not present

## 2015-10-28 DIAGNOSIS — Z800 Family history of malignant neoplasm of digestive organs: Secondary | ICD-10-CM | POA: Insufficient documentation

## 2015-10-28 DIAGNOSIS — N289 Disorder of kidney and ureter, unspecified: Secondary | ICD-10-CM

## 2015-10-28 DIAGNOSIS — Z7901 Long term (current) use of anticoagulants: Secondary | ICD-10-CM | POA: Insufficient documentation

## 2015-10-28 DIAGNOSIS — G629 Polyneuropathy, unspecified: Secondary | ICD-10-CM | POA: Insufficient documentation

## 2015-10-28 DIAGNOSIS — I10 Essential (primary) hypertension: Secondary | ICD-10-CM | POA: Diagnosis not present

## 2015-10-28 DIAGNOSIS — I480 Paroxysmal atrial fibrillation: Secondary | ICD-10-CM | POA: Diagnosis not present

## 2015-10-28 DIAGNOSIS — I499 Cardiac arrhythmia, unspecified: Secondary | ICD-10-CM | POA: Diagnosis not present

## 2015-10-28 DIAGNOSIS — I700 Atherosclerosis of aorta: Secondary | ICD-10-CM | POA: Insufficient documentation

## 2015-10-28 DIAGNOSIS — Z85038 Personal history of other malignant neoplasm of large intestine: Secondary | ICD-10-CM | POA: Insufficient documentation

## 2015-10-28 DIAGNOSIS — C185 Malignant neoplasm of splenic flexure: Secondary | ICD-10-CM | POA: Insufficient documentation

## 2015-10-28 DIAGNOSIS — Z79899 Other long term (current) drug therapy: Secondary | ICD-10-CM | POA: Insufficient documentation

## 2015-10-28 DIAGNOSIS — Z86718 Personal history of other venous thrombosis and embolism: Secondary | ICD-10-CM | POA: Diagnosis not present

## 2015-10-28 DIAGNOSIS — R609 Edema, unspecified: Secondary | ICD-10-CM | POA: Diagnosis not present

## 2015-10-28 LAB — BASIC METABOLIC PANEL
ANION GAP: 6 (ref 5–15)
BUN: 24 mg/dL — ABNORMAL HIGH (ref 6–20)
CHLORIDE: 105 mmol/L (ref 101–111)
CO2: 28 mmol/L (ref 22–32)
Calcium: 9.2 mg/dL (ref 8.9–10.3)
Creatinine, Ser: 1.72 mg/dL — ABNORMAL HIGH (ref 0.61–1.24)
GFR calc non Af Amer: 40 mL/min — ABNORMAL LOW (ref >60)
GFR, EST AFRICAN AMERICAN: 47 mL/min — AB (ref >60)
Glucose, Bld: 98 mg/dL (ref 65–99)
POTASSIUM: 4.3 mmol/L (ref 3.5–5.1)
SODIUM: 139 mmol/L (ref 135–145)

## 2015-10-28 MED ORDER — GADOBENATE DIMEGLUMINE 529 MG/ML IV SOLN
20.0000 mL | Freq: Once | INTRAVENOUS | Status: AC | PRN
  Administered 2015-10-28: 20 mL via INTRAVENOUS

## 2015-10-29 LAB — CEA: CEA: 6.9 ng/mL — AB (ref 0.0–4.7)

## 2015-10-31 NOTE — Progress Notes (Signed)
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 He currently feels well and is asymptomatic. He has a mild peripheral neuropathy but it does not affect his day-to-day activity. He denies any fevers or illnesses. He has no neurologic complaints. He has no chest pain or shortness of breath. He denies any weight loss. He denies any easy bleeding or bruising. He has no nausea, vomiting, diarrhea, or constipation. Patient offers no specific complaints today.   REVIEW OF SYSTEMS:   Review of Systems  Constitutional: Negative.  Negative for fever, malaise/fatigue and weight loss.  Respiratory: Negative.  Negative for shortness of breath.   Cardiovascular: Negative.  Negative for chest pain.  Gastrointestinal: Negative for abdominal pain, blood in stool and melena.  Genitourinary: Negative for frequency, hematuria and urgency.  Musculoskeletal: Negative.   Neurological: Positive for sensory change. Negative for weakness.  Psychiatric/Behavioral: Negative.  The patient is not nervous/anxious.     As per HPI. Otherwise, a complete review of systems is negatve.  PAST MEDICAL HISTORY: Past Medical History:  Diagnosis Date  . Colon cancer ([place])    a. 11/2013 s/p colon resection/Hartmann's procedure;  b. Chemo: FOLFOX completed 07/2014.  Marland Kitchen DVT (deep venous thrombosis) ([place])    RIGHT LEG/ HX OF  . DVT (deep venous thrombosis) ([place])   . Dysrhythmia   . Hypertension   . Lower extremity edema    Rt leg    . 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 PAF (paroxysmal atrial fibrillation) ([place])   . S/P chemotherapy, time since less than 4 weeks   . Tobacco abuse     PAST SURGICAL HISTORY: Past Surgical History:  Procedure Laterality Date  . COLON RESECTION  12/19/2013   colostomy  . COLONOSCOPY WITH PROPOFOL N/A 09/14/2014   Procedure: COLONOSCOPY WITH PROPOFOL THROUGH COLOSTOMY AND COLONOSCOPY THRU RECTUM;  Surgeon: Lucilla Lame, MD;  Location: [facility];  Service: Endoscopy;  Laterality: N/A;  TRANSVERSE COLON POLYP AND SIGMOID COLON POLYP  . COLOSTOMY REVERSAL N/A 10/28/2014   Procedure: COLOSTOMY REVERSAL;  Surgeon: Marlyce Huge, MD;  Location: [facility];  Service: General;  Laterality: N/A;  . FEMUR FRACTURE SURGERY Right   . LEG SURGERY    . LYSIS OF ADHESION  10/28/2014   Procedure: LYSIS OF ADHESION;  Surgeon: Marlyce Huge, MD;  Location: [facility];  Service: General;;  . PORT A CATH INJECTION ([place] HX) Right   . TUNNELED VENOUS PORT PLACEMENT      FAMILY HISTORY: Grandmother with "liver cancer".     ADVANCED DIRECTIVES:    HEALTH MAINTENANCE: Social History  Substance Use Topics  . Smoking status: Current Some Day Smoker    Packs/day: 0.50    Years: 54.00    Types: Cigarettes  . Smokeless tobacco: Never Used  . Alcohol use 3.6 oz/week    6 Cans of beer per week     Comment: moderate beer     Colonoscopy:  PAP:  Bone density:  Lipid panel:  Allergies  Allergen Reactions  . No Known Allergies  Current Outpatient Prescriptions  Medication Sig Dispense Refill  . aluminum-magnesium hydroxide-simethicone (MAALOX) I037812 MG/5ML SUSP Take 5 mLs by mouth every 6 (six) hours as needed. Reported on 08/12/2015    . amiodarone (PACERONE) 200 MG tablet Take 200 mg by mouth daily.     Marland Kitchen amLODipine (NORVASC) 10 MG tablet Take 1 tablet by mouth daily.    . bisacodyl (DULCOLAX) 5 MG EC tablet Take 1 tablet (5 mg total) by  mouth daily as needed for moderate constipation. 10 tablet 0  . docusate sodium (COLACE) 100 MG capsule Take 1 capsule (100 mg total) by mouth 2 (two) times daily. 20 capsule 0  . gabapentin (NEURONTIN) 300 MG capsule Take 1 capsule (300 mg total) by mouth 3 (three) times daily. 90 capsule 1  . lisinopril (PRINIVIL,ZESTRIL) 20 MG tablet Take 1 tablet by mouth daily. Reported on 08/12/2015    . metoprolol tartrate (LOPRESSOR) 25 MG tablet Take 25 mg by mouth 2 (two) times daily. Am and pm    . polyethylene glycol (MIRALAX) packet Take 17 g by mouth daily. 14 each 0  . warfarin (COUMADIN) 1 MG tablet Take 1 mg by mouth daily. Take on MWF with 5mg  tablet for total dose of 6mg  on MWF    . warfarin (COUMADIN) 5 MG tablet Take 5 mg by mouth one time only at 6 PM. On TuThSaSu 5mg , on MWF 6mg      No current facility-administered medications for this visit.    Facility-Administered Medications Ordered in Other Visits  Medication Dose Route Frequency Provider Last Rate Last Dose  . sodium chloride 0.9 % injection 10 mL  10 mL Intracatheter PRN Lloyd Huger, MD   10 mL at 10/19/14 1353    OBJECTIVE: Vitals:   11/01/15 1100  BP: 129/76  Pulse: (!) 52  Resp: 18  Temp: (!) 95 F (35 C)     Body mass index is 32.26 kg/m.    ECOG FS:0 - Asymptomatic  General: Well-developed, well-nourished, no acute distress. Eyes: anicteric sclera. Lungs: Clear to auscultation bilaterally. Heart: Regular rate and rhythm. No rubs, murmurs, or gallops. Abdomen: Soft, nontender, nondistended. No organomegaly noted, normoactive bowel sounds. Musculoskeletal: No edema, cyanosis, or clubbing. Neuro: Alert, answering all questions appropriately. Cranial nerves grossly intact. Skin: No rashes or petechiae noted. Psych: Normal affect.    LAB RESULTS:  Lab Results  Component Value Date   NA 139 10/28/2015   K 4.3 10/28/2015   CL 105 10/28/2015   CO2 28 10/28/2015   GLUCOSE 98 10/28/2015   BUN 24 (H)  10/28/2015   CREATININE 1.72 (H) 10/28/2015   CALCIUM 9.2 10/28/2015   PROT 7.2 09/07/2015   ALBUMIN 4.2 09/07/2015   AST 20 09/07/2015   ALT 22 09/07/2015   ALKPHOS 51 09/07/2015   BILITOT 0.7 09/07/2015   GFRNONAA 40 (L) 10/28/2015   GFRAA 47 (L) 10/28/2015    Lab Results  Component Value Date   WBC 6.3 09/07/2015   NEUTROABS 2.9 07/28/2015   HGB 14.3 09/07/2015   HCT 42.3 09/07/2015   MCV 89.5 09/07/2015   PLT 163 09/07/2015   Lab Results  Component Value Date   CEA 6.9 (H) 10/28/2015      STUDIES:    Mr Abdomen W Wo Contrast  Result Date: 10/28/2015 CLINICAL DATA:  65 year old male with a history of left colon cancer status post resection and chemotherapy presents for follow-up of enhancing right renal mass. EXAM: MRI ABDOMEN WITHOUT AND WITH CONTRAST TECHNIQUE:  Multiplanar multisequence MR imaging of the abdomen was performed both before and after the administration of intravenous contrast. CONTRAST:  35mL MULTIHANCE GADOBENATE DIMEGLUMINE 529 MG/ML IV SOLN COMPARISON:  09/08/2015 CT abdomen/ pelvis.  04/05/2015 MRI abdomen. FINDINGS: Lower chest: Clear lung bases. Hepatobiliary: Normal liver size and configuration. Mild diffuse hepatic steatosis. No liver mass. Normal gallbladder with no cholelithiasis. No biliary ductal dilatation. Common bile duct diameter 3 mm. No choledocholithiasis. Pancreas: No pancreatic mass or duct dilation. The study is not tailored for the assessment of pancreas divisum. The normal caliber main pancreatic duct at least partially drains via an accessory duct of Santorini. There may be communication with the main pancreatic duct and common bile duct, not well assessed on this study. Spleen: Normal size. No mass. Adrenals/Urinary Tract: Stable 1.4 cm right adrenal adenoma. No left adrenal nodule. No hydronephrosis. There is a 2.5 x 2.1 x 2.7 cm renal cortical mass in the posterior lower right kidney (series 3/ image 17), which demonstrates heterogeneous  hypoenhancement and solid 9 mm mural nodule, previously 2.3 x 2.0 x 2.5 cm on 04/05/2015, mildly increased in size. There is a 1.1 cm renal cortical lesion in the anterior upper left kidney (series 12/image 17), which demonstrates peripheral T2 hypointensity and no appreciable enhancement, correlating with a calcified lesion on CT, stable in size back to 12/16/2013, most consistent with a benign lesion. Several additional simple subcentimeter renal cysts are present in both kidneys. Single renal arteries bilaterally. No evidence of renal vein invasion. Stomach/Bowel: Grossly normal stomach. Visualized small and large bowel is normal caliber, with no bowel wall thickening. Vascular/Lymphatic: Aortic atherosclerosis. Infrarenal 4.1 cm abdominal aortic aneurysm, not appreciably changed from 04/05/2015 MRI using similar measurement technique. Patent portal, splenic, hepatic and renal veins. No pathologically enlarged lymph nodes in the abdomen. Other: No abdominal ascites or focal fluid collection. Small umbilical and medial ventral upper left abdominal wall hernias containing portions of small bowel loops, unchanged from 09/08/2015 CT study. Musculoskeletal: No aggressive appearing focal osseous lesions. IMPRESSION: 1. Mild interval growth of enhancing 2.7 cm renal cortical mass in the posterior lower right kidney with mural nodularity, for which renal cell carcinoma is the diagnosis of exclusion. No renal vein invasion. 2. No evidence of metastatic disease in the abdomen. 3. Stable 4.1 cm infrarenal abdominal aortic aneurysm. Recommend followup by ultrasound in 1 year. This recommendation follows ACR consensus guidelines: White Paper of the ACR Incidental Findings Committee II on Vascular Findings. J Am Coll Radiol 2013; 10:789-794. 4. Mild diffuse hepatic steatosis. 5. Small umbilical and medial ventral upper left abdominal wall hernias containing portions of small bowel loops, unchanged from 09/08/2015 CT study. No  dilated small bowel loops. Electronically Signed   By: Ilona Sorrel M.D.   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  PLAN:    1. Stage IIIB adenocarcinoma of the desending colon: Patient completed 12 cycles of FOLFOX on July 29, 2014. CEA is slowly trending up. CT scan results from November 08, 2014 reviewed independently with no evidence of disease. Return to clinic in 3 months for laboratory work only and then in 6 months laboratory work and further evaluation. He reports he has a repeat colonoscopy in the next several weeks.  2. H/o DVT: Continue Coumadin. Patient's INR is her monitored by primary care.  3. Elevated creatinine: Patient's creatinine is approximately his baseline, monitor.  4. Neuropathy: Continue 300 mg gabapentin to take 3 times a  day. 5. Renal lesion: MRI results from October 28, 2015 reviewed independently and reported as above. Lesion is suspicious for renal cell carcinoma and has slightly increased in size. No intervention is needed at this time, repeat, MRI in 6 months.  Patient likely require urology referral in the near future.  Patient expressed understanding and was in agreement with this plan. He also understands that He can call clinic at any time with any questions, concerns, or complaints.   Colon cancer   Staging form: Colon/Rectum, AJCC 7th Edition     Pathologic stage from 05/04/2014: Stage IIIB (T4, N1, M0) - Signed by Lloyd Huger, MD on 05/04/2014   Lloyd Huger, MD   11/03/2015 1:03 PM

## 2015-11-01 ENCOUNTER — Inpatient Hospital Stay (HOSPITAL_BASED_OUTPATIENT_CLINIC_OR_DEPARTMENT_OTHER): Payer: Medicaid Other | Admitting: Oncology

## 2015-11-01 VITALS — BP 129/76 | HR 52 | Temp 95.0°F | Resp 18 | Wt 244.5 lb

## 2015-11-01 DIAGNOSIS — Z85038 Personal history of other malignant neoplasm of large intestine: Secondary | ICD-10-CM | POA: Diagnosis not present

## 2015-11-01 DIAGNOSIS — F17210 Nicotine dependence, cigarettes, uncomplicated: Secondary | ICD-10-CM

## 2015-11-01 DIAGNOSIS — I714 Abdominal aortic aneurysm, without rupture: Secondary | ICD-10-CM

## 2015-11-01 DIAGNOSIS — I480 Paroxysmal atrial fibrillation: Secondary | ICD-10-CM

## 2015-11-01 DIAGNOSIS — Z800 Family history of malignant neoplasm of digestive organs: Secondary | ICD-10-CM

## 2015-11-01 DIAGNOSIS — K760 Fatty (change of) liver, not elsewhere classified: Secondary | ICD-10-CM

## 2015-11-01 DIAGNOSIS — Z7901 Long term (current) use of anticoagulants: Secondary | ICD-10-CM

## 2015-11-01 DIAGNOSIS — Z79899 Other long term (current) drug therapy: Secondary | ICD-10-CM

## 2015-11-01 DIAGNOSIS — Z86718 Personal history of other venous thrombosis and embolism: Secondary | ICD-10-CM | POA: Diagnosis not present

## 2015-11-01 DIAGNOSIS — C772 Secondary and unspecified malignant neoplasm of intra-abdominal lymph nodes: Principal | ICD-10-CM

## 2015-11-01 DIAGNOSIS — I499 Cardiac arrhythmia, unspecified: Secondary | ICD-10-CM

## 2015-11-01 DIAGNOSIS — C186 Malignant neoplasm of descending colon: Secondary | ICD-10-CM

## 2015-11-01 DIAGNOSIS — I700 Atherosclerosis of aorta: Secondary | ICD-10-CM

## 2015-11-01 DIAGNOSIS — R609 Edema, unspecified: Secondary | ICD-10-CM

## 2015-11-01 DIAGNOSIS — N2889 Other specified disorders of kidney and ureter: Secondary | ICD-10-CM

## 2015-11-01 DIAGNOSIS — I10 Essential (primary) hypertension: Secondary | ICD-10-CM

## 2015-11-01 DIAGNOSIS — Z9221 Personal history of antineoplastic chemotherapy: Secondary | ICD-10-CM

## 2015-11-01 DIAGNOSIS — G629 Polyneuropathy, unspecified: Secondary | ICD-10-CM | POA: Diagnosis not present

## 2015-11-01 DIAGNOSIS — R944 Abnormal results of kidney function studies: Secondary | ICD-10-CM | POA: Diagnosis not present

## 2015-11-01 NOTE — Progress Notes (Signed)
States is feeling well. Offers no complaints. 

## 2015-11-10 ENCOUNTER — Ambulatory Visit: Payer: Self-pay | Admitting: General Surgery

## 2015-11-11 ENCOUNTER — Ambulatory Visit: Payer: Self-pay | Admitting: General Surgery

## 2015-11-23 ENCOUNTER — Encounter
Admission: RE | Disposition: A | Discharge status: Home / Self Care | Payer: Self-pay | Source: Ambulatory Visit | Attending: Gastroenterology

## 2015-11-23 ENCOUNTER — Ambulatory Visit
Admission: RE | Admit: 2015-11-23 | Discharge: 2015-11-23 | Disposition: A | Discharge status: Home / Self Care | Payer: Medicaid Other | Source: Ambulatory Visit | Attending: Gastroenterology | Admitting: Gastroenterology

## 2015-11-23 ENCOUNTER — Encounter: Payer: Self-pay | Admitting: *Deleted

## 2015-11-23 ENCOUNTER — Other Ambulatory Visit: Payer: Self-pay

## 2015-11-23 DIAGNOSIS — Z538 Procedure and treatment not carried out for other reasons: Secondary | ICD-10-CM | POA: Insufficient documentation

## 2015-11-23 DIAGNOSIS — Z1211 Encounter for screening for malignant neoplasm of colon: Secondary | ICD-10-CM | POA: Insufficient documentation

## 2015-11-23 SURGERY — COLONOSCOPY WITH PROPOFOL
Anesthesia: General

## 2015-11-23 NOTE — Anesthesia Preprocedure Evaluation (Addendum)
Anesthesia Evaluation  Patient identified by MRN, date of birth, ID band Patient awake    Reviewed: Allergy & Precautions, NPO status , Patient's Chart, lab work & pertinent test results, reviewed documented beta blocker date and time   History of Anesthesia Complications Negative for: history of anesthetic complications  Airway Mallampati: II  TM Distance: >3 FB Neck ROM: Full    Dental no notable dental hx.    Pulmonary neg sleep apnea, neg COPD, Current Smoker,    breath sounds clear to auscultation- rhonchi (-) wheezing      Cardiovascular hypertension, Pt. on medications and Pt. on home beta blockers (-) CAD and (-) Past MI + dysrhythmias Atrial Fibrillation  Rhythm:Regular Rate:Normal - Systolic murmurs and - Diastolic murmurs Echo [id number]: NORMAL LEFT VENTRICULAR SYSTOLIC FUNCTION WITH AN ESTIMATED EF = 55 % NORMAL RIGHT VENTRICULAR SYSTOLIC FUNCTION MILD-TO-MODERATE TRICUSPID VALVE INSUFFICIENCY MILD MITRAL VALVE INSUFFICIENCY NO VALVULAR STENOSIS MILD BIATRIAL ENLARGEMENT MILD CONCENTRIC LEFT VENTRICULAR HYPERTROPHY MILDLY DILATED AORTIC ROOT   Neuro/Psych negative psych ROS   GI/Hepatic negative GI ROS, Neg liver ROS,   Endo/Other  negative endocrine ROSneg diabetes  Renal/GU negative Renal ROS     Musculoskeletal negative musculoskeletal ROS (+)   Abdominal (+) + obese,   Peds  Hematology negative hematology ROS (+)   Anesthesia Other Findings Past Medical History: No date: Colon cancer ([place])     Comment: a. 11/2013 s/p colon resection/Hartmann's               procedure;  b. Chemo: FOLFOX completed 07/2014. No date: DVT (deep venous thrombosis) ([facility])     Comment: RIGHT LEG/ HX OF No date: DVT (deep venous thrombosis) ([facility]) No date: Dysrhythmia No date: Hypertension No date: Lower extremity edema     Comment: Rt leg No date: Neuromuscular disorder ([facility])     Comment: TINGLING IN FINGERS and  feet No date: PAF (paroxysmal atrial fibrillation) ([facility]) No date: S/P chemotherapy, time since less than 4 weeks No date: Tobacco abuse   Reproductive/Obstetrics                             Anesthesia Physical Anesthesia Plan  ASA: III  Anesthesia Plan: General   Post-op Pain Management:    Induction: Intravenous  Airway Management Planned: Natural Airway  Additional Equipment:   Intra-op Plan:   Post-operative Plan:   Informed Consent: I have reviewed the patients History and Physical, chart, labs and discussed the procedure including the risks, benefits and alternatives for the proposed anesthesia with the patient or authorized representative who has indicated his/her understanding and acceptance.   Dental advisory given  Plan Discussed with: CRNA and Anesthesiologist  Anesthesia Plan Comments:         Anesthesia Quick Evaluation

## 2015-11-25 ENCOUNTER — Other Ambulatory Visit: Payer: Self-pay

## 2015-11-25 ENCOUNTER — Ambulatory Visit: Payer: Self-pay | Admitting: General Surgery

## 2015-11-25 DIAGNOSIS — Z1211 Encounter for screening for malignant neoplasm of colon: Secondary | ICD-10-CM

## 2015-11-25 MED ORDER — NA SULFATE-K SULFATE-MG SULF 17.5-3.13-1.6 GM/177ML PO SOLN: 1.0000 | Freq: Once | ORAL | 0 refills | Status: AC

## 2015-11-26 ENCOUNTER — Encounter: Payer: Self-pay | Admitting: *Deleted

## 2015-11-29 ENCOUNTER — Encounter
Admission: RE | Disposition: A | Discharge status: Home / Self Care | Payer: Self-pay | Source: Ambulatory Visit | Attending: Gastroenterology

## 2015-11-29 ENCOUNTER — Ambulatory Visit
Admission: RE | Admit: 2015-11-29 | Discharge: 2015-11-29 | Disposition: A | Discharge status: Home / Self Care | Payer: Medicaid Other | Source: Ambulatory Visit | Attending: Gastroenterology | Admitting: Gastroenterology

## 2015-11-29 ENCOUNTER — Ambulatory Visit: Payer: Medicaid Other | Admitting: Anesthesiology

## 2015-11-29 ENCOUNTER — Telehealth: Payer: Self-pay

## 2015-11-29 DIAGNOSIS — F17210 Nicotine dependence, cigarettes, uncomplicated: Secondary | ICD-10-CM | POA: Diagnosis not present

## 2015-11-29 DIAGNOSIS — K635 Polyp of colon: Secondary | ICD-10-CM | POA: Diagnosis not present

## 2015-11-29 DIAGNOSIS — Z7901 Long term (current) use of anticoagulants: Secondary | ICD-10-CM | POA: Diagnosis not present

## 2015-11-29 DIAGNOSIS — D120 Benign neoplasm of cecum: Secondary | ICD-10-CM | POA: Diagnosis not present

## 2015-11-29 DIAGNOSIS — I10 Essential (primary) hypertension: Secondary | ICD-10-CM | POA: Diagnosis not present

## 2015-11-29 DIAGNOSIS — I480 Paroxysmal atrial fibrillation: Secondary | ICD-10-CM | POA: Insufficient documentation

## 2015-11-29 DIAGNOSIS — Z9221 Personal history of antineoplastic chemotherapy: Secondary | ICD-10-CM | POA: Diagnosis not present

## 2015-11-29 DIAGNOSIS — Z79899 Other long term (current) drug therapy: Secondary | ICD-10-CM | POA: Insufficient documentation

## 2015-11-29 DIAGNOSIS — Z1211 Encounter for screening for malignant neoplasm of colon: Secondary | ICD-10-CM | POA: Diagnosis not present

## 2015-11-29 DIAGNOSIS — D123 Benign neoplasm of transverse colon: Secondary | ICD-10-CM

## 2015-11-29 DIAGNOSIS — D125 Benign neoplasm of sigmoid colon: Secondary | ICD-10-CM

## 2015-11-29 DIAGNOSIS — D122 Benign neoplasm of ascending colon: Secondary | ICD-10-CM | POA: Diagnosis not present

## 2015-11-29 DIAGNOSIS — Z85038 Personal history of other malignant neoplasm of large intestine: Secondary | ICD-10-CM | POA: Diagnosis not present

## 2015-11-29 HISTORY — PX: COLONOSCOPY WITH PROPOFOL: SHX5780

## 2015-11-29 SURGERY — COLONOSCOPY WITH PROPOFOL
Anesthesia: General

## 2015-11-29 MED ORDER — SODIUM CHLORIDE 0.9 % IV SOLN
INTRAVENOUS | Status: DC
  Administered 2015-11-29: 1000 mL via INTRAVENOUS

## 2015-11-29 MED ORDER — PROPOFOL 500 MG/50ML IV EMUL
INTRAVENOUS | Status: DC | PRN
  Administered 2015-11-29: 180 ug/kg/min via INTRAVENOUS

## 2015-11-29 MED ORDER — PHENYLEPHRINE HCL 10 MG/ML IJ SOLN
INTRAMUSCULAR | Status: DC | PRN
  Administered 2015-11-29 (×4): 100 ug via INTRAVENOUS

## 2015-11-29 MED ORDER — EPHEDRINE SULFATE 50 MG/ML IJ SOLN
INTRAMUSCULAR | Status: DC | PRN
  Administered 2015-11-29: 10 mg via INTRAVENOUS

## 2015-11-29 MED ORDER — FENTANYL CITRATE (PF) 100 MCG/2ML IJ SOLN
INTRAMUSCULAR | Status: DC | PRN
  Administered 2015-11-29: 50 ug via INTRAVENOUS

## 2015-11-29 MED ORDER — SODIUM CHLORIDE 0.9 % IV SOLN: INTRAVENOUS | Status: DC

## 2015-11-29 MED ORDER — MIDAZOLAM HCL 2 MG/2ML IJ SOLN
INTRAMUSCULAR | Status: DC | PRN
  Administered 2015-11-29: 1 mg via INTRAVENOUS

## 2015-11-29 MED ORDER — PROPOFOL 10 MG/ML IV BOLUS
INTRAVENOUS | Status: DC | PRN
  Administered 2015-11-29: 50 mg via INTRAVENOUS
  Administered 2015-11-29: 20 mg via INTRAVENOUS

## 2015-11-29 NOTE — Op Note (Signed)
[facility] Gastroenterology Patient Name: James Keller Procedure Date: 11/29/2015 9:31 AM MRN: [id number] Account #: [id number] Date of Birth: Jun 20, 1950 Admit Type: Ambulatory Age: 65 Room: [facility] ENDO ROOM 3 Gender: Male Note Status: Finalized Procedure:            Colonoscopy Indications:          High risk colon cancer surveillance: Personal history                        of colon cancer, Last colonoscopy: August 2016 Providers:            [name] MD, MD, Carmelina Dane. Alexander MD, MD Medicines:            Monitored Anesthesia Care Complications:        No immediate complications. Procedure:            Pre-Anesthesia Assessment:                       - Prior to the procedure, a History and Physical was                        performed, and patient medications, allergies and                        sensitivities were reviewed. The patient's tolerance of                        previous anesthesia was reviewed.                       - ASA Grade Assessment: III - A patient with severe                        systemic disease.                       After obtaining informed consent, the colonoscope was                        passed under direct vision. Throughout the procedure,                        the patient's blood pressure, pulse, and oxygen                        saturations were monitored continuously.The colonoscopy                        was performed with ease. The patient tolerated the                        procedure well. The quality of the bowel preparation                        was good. The Colonoscope was introduced through the                        and advanced to the the cecum, identified by the                        appendiceal  orifice, ileocecal valve and palpation. Findings:      The perianal and digital rectal examinations were normal.      Two sessile, non-bleeding polyps were found in the cecum. The polyps       were 5 mm in size. These  polyps were removed with a cold biopsy forceps.       Resection and retrieval were complete.      Three sessile, non-bleeding polyps were found in the ascending colon.       The polyps were 6 mm in size. These polyps were removed with a cold       biopsy forceps. Resection and retrieval were complete.      An 8 mm, non-bleeding polyp was found in the hepatic flexure. The polyp       was sessile. The polyp was removed with a hot snare. Resection and       retrieval were complete.      A 7 mm, non-bleeding polyp was found in the sigmoid colon. The polyp was       sessile. The polyp was removed with a cold snare. Resection and       retrieval were complete.      Normal anasstamosis seen at the rectosigmoid area Impression:           - Two 5 mm, non-bleeding polyps in the cecum, removed                        with a cold biopsy forceps. Resected and retrieved.                       - Three 6 mm, non-bleeding polyps in the ascending                        colon, removed with a cold biopsy forceps. Resected and                        retrieved.                       - One 8 mm, non-bleeding polyp at the hepatic flexure,                        removed with a hot snare. Resected and retrieved.                       - One 7 mm, non-bleeding polyp in the sigmoid colon,                        removed with a cold snare. Resected and retrieved. Recommendation:       - Await pathology results.                       - Repeat colonoscopy for surveillance based on                        pathology results.                       - Discharge patient to home (with escort).                       - Patient  has a contact number available for                        emergencies. The signs and symptoms of potential                        delayed complications were discussed with the patient.                        Return to normal activities tomorrow. Written discharge                        instructions were  provided to the patient.                       - Resume previous diet today. Diagnosis Code(s):    --- Professional ---                       [phone], Personal history of other malignant neoplasm                        of large intestine                       D12.0, Benign neoplasm of cecum                       D12.2, Benign neoplasm of ascending colon                       D12.3, Benign neoplasm of transverse colon (hepatic                        flexure or splenic flexure)                       D12.5, Benign neoplasm of sigmoid colon Attending Participation:      I personally performed the entire procedure. [name], MD [name] MD, MD 11/29/2015 10:09:01 AM This report has been signed electronically. Gayland Curry MD, MD Number of Addenda: 0 Note Initiated On: 11/29/2015 9:31 AM Scope Withdrawal Time: 0 hours 18 minutes 56 seconds  Total Procedure Duration: 0 hours 20 minutes 51 seconds       [facility]

## 2015-11-29 NOTE — Transfer of Care (Signed)
Immediate Anesthesia Transfer of Care Note  Patient: James Keller  Procedure(s) Performed: Procedure(s): COLONOSCOPY WITH PROPOFOL (N/A)  Patient Location: PACU  Anesthesia Type:General  Level of Consciousness: awake  Airway & Oxygen Therapy: Patient Spontanous Breathing and Patient connected to nasal cannula oxygen  Post-op Assessment: Report given to RN and Post -op Vital signs reviewed and stable  Post vital signs: Reviewed and stable  Last Vitals:  Vitals:   11/29/15 0851 11/29/15 1009  BP: 137/76   Pulse: 77 64  Resp: 16 10  Temp: 36.4 C 36.2 C    Last Pain:  Vitals:   11/29/15 1009  TempSrc: Tympanic         Complications: No apparent anesthesia complications

## 2015-11-29 NOTE — Anesthesia Procedure Notes (Signed)
Performed by: Nickolaos Brallier       

## 2015-11-29 NOTE — Anesthesia Procedure Notes (Signed)
Date/Time: 11/29/2015 9:40 AM Performed by: Allean Found Pre-anesthesia Checklist: Patient identified, Emergency Drugs available, Suction available, Patient being monitored and Timeout performed Patient Re-evaluated:Patient Re-evaluated prior to inductionOxygen Delivery Method: Nasal cannula Preoxygenation: Pre-oxygenation with 100% oxygen Intubation Type: IV induction

## 2015-11-29 NOTE — Telephone Encounter (Signed)
Appointment reminder mailed at this time to patients address. Appointment is 12/27/15 at 9:45 with Dr.Woodham.

## 2015-11-29 NOTE — H&P (Signed)
[name] MD [address], [place], [place] [address] Phone: [phone] Fax : [phone]  Primary Care Physician:  Sabino Snipes KEY, MD Primary Gastroenterologist:  Dr. [name]   Pre-Procedure History & Physical: HPI:  James Keller is a 65 y.o. male is here for an colonoscopy.   Past Medical History:  Diagnosis Date  . Colon cancer ([place])    a. 11/2013 s/p colon resection/Hartmann's procedure;  b. Chemo: FOLFOX completed 07/2014.  Marland Kitchen DVT (deep venous thrombosis) ([place])    RIGHT LEG/ HX OF  . DVT (deep venous thrombosis) ([place])   . Dysrhythmia   . Hypertension   . Lower extremity edema    Rt leg  . 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 PAF (paroxysmal atrial fibrillation) ([place])   . S/P chemotherapy, time since less than 4 weeks   . Tobacco abuse     Past Surgical History:  Procedure Laterality Date  . COLON RESECTION  12/19/2013   colostomy  . COLON SURGERY    . COLONOSCOPY WITH PROPOFOL N/A 09/14/2014   Procedure: COLONOSCOPY WITH PROPOFOL THROUGH COLOSTOMY AND COLONOSCOPY THRU RECTUM;  Surgeon: Lucilla Lame, MD;  Location: [facility];  Service: Endoscopy;  Laterality: N/A;  TRANSVERSE COLON POLYP AND SIGMOID COLON POLYP  . COLOSTOMY REVERSAL N/A 10/28/2014   Procedure: COLOSTOMY REVERSAL;  Surgeon: Marlyce Huge, MD;  Location: [facility] ORS;  Service: General;  Laterality: N/A;  . FEMUR FRACTURE SURGERY Right   . LEG SURGERY    . LYSIS OF ADHESION  10/28/2014   Procedure: LYSIS OF ADHESION;  Surgeon: Marlyce Huge, MD;  Location: [facility] ORS;  Service: General;;  . PORT A CATH INJECTION ([place] HX) Right   . TUNNELED VENOUS PORT PLACEMENT      Prior to Admission medications   Medication Sig Start Date End Date Taking? Authorizing Provider  aluminum-magnesium hydroxide-simethicone (MAALOX) [id number] MG/5ML SUSP Take 5 mLs by mouth every 6 (six) hours as needed. Reported on 08/12/2015   Yes Historical Provider,  MD  amiodarone (PACERONE) 200 MG tablet Take 200 mg by mouth daily.  11/11/14  Yes Historical Provider, MD  amLODipine (NORVASC) 10 MG tablet Take 1 tablet by mouth daily. 06/29/15 06/28/16 Yes Historical Provider, MD  bisacodyl (DULCOLAX) 5 MG EC tablet Take 1 tablet (5 mg total) by mouth daily as needed for moderate constipation. 09/08/15 09/07/16 Yes Loney Hering, MD  docusate sodium (COLACE) 100 MG capsule Take 1 capsule (100 mg total) by mouth 2 (two) times daily. 09/08/15 09/07/16 Yes Loney Hering, MD  gabapentin (NEURONTIN) 300 MG capsule Take 1 capsule (300 mg total) by mouth 3 (three) times daily. 09/07/14  Yes Lloyd Huger, MD  lisinopril (PRINIVIL,ZESTRIL) 20 MG tablet Take 1 tablet by mouth daily. Reported on 08/12/2015   Yes Historical Provider, MD  metoprolol tartrate (LOPRESSOR) 25 MG tablet Take 25 mg by mouth 2 (two) times daily. Am and pm 12/27/13  Yes Historical Provider, MD  polyethylene glycol (MIRALAX) packet Take 17 g by mouth daily. 09/08/15  Yes Loney Hering, MD  warfarin (COUMADIN) 1 MG tablet Take 1 mg by mouth daily. Take on MWF with 5mg  tablet for total dose of 6mg  on MWF   Yes Historical Provider, MD    Allergies as of 11/23/2015 - Review Complete 11/23/2015  Allergen Reaction Noted  . No known allergies  04/25/2014    Family History  Problem Relation Age of Onset  . Hypertension  Mother     Social History   Social History  . Marital status: Single    Spouse name: N/A  . Number of children: N/A  . Years of education: N/A   Occupational History  . Not on file.   Social History Main Topics  . Smoking status: Current Some Day Smoker    Packs/day: 0.50    Years: 54.00    Types: Cigarettes  . Smokeless tobacco: Never Used  . Alcohol use 3.6 oz/week    6 Cans of beer per week     Comment: moderate beer  . Drug use:     Types: Marijuana     Comment: occasional  . Sexual activity: Not on file   Other Topics Concern  . Not on file    Social History Narrative  . No narrative on file    Review of Systems: See HPI, otherwise negative ROS  Physical Exam: BP 137/76   Pulse 77   Temp 97.6 F (36.4 C) (Tympanic)   Resp 16   Ht 6\' 1"  (1.854 m)   Wt 242 lb (109.8 kg)   SpO2 100%   BMI 31.93 kg/m  General:   Alert,  pleasant and cooperative in NAD Head:  Normocephalic and atraumatic. Neck:  Supple; no masses or thyromegaly. Lungs:  Clear throughout to auscultation.    Heart:  Regular rate and rhythm. Abdomen:  Soft, nontender and nondistended. Normal bowel sounds, without guarding, and without rebound.   Neurologic:  Alert and  oriented x4;  grossly normal neurologically.  Impression/Plan: James Keller is here for an colonoscopy to be performed for colorectal cancer surveillance,Stage IIIB adenocarcinoma of the desending colon: Patient completed 12 cycles of FOLFOX on July 29, 2014.  Risks, benefits, limitations, and alternatives regarding  colonoscopy have been reviewed with the patient.  Questions have been answered.  All parties agreeable.   [name], MD  11/29/2015, 9:25 AM

## 2015-11-29 NOTE — Anesthesia Postprocedure Evaluation (Signed)
Anesthesia Post Note  Patient: James Keller  Procedure(s) Performed: Procedure(s) (LRB): COLONOSCOPY WITH PROPOFOL (N/A)  Patient location during evaluation: Endoscopy Anesthesia Type: General Level of consciousness: awake and alert Pain management: pain level controlled Vital Signs Assessment: post-procedure vital signs reviewed and stable Respiratory status: spontaneous breathing and respiratory function stable Cardiovascular status: stable Anesthetic complications: no    Last Vitals:  Vitals:   11/29/15 0851 11/29/15 1009  BP: 137/76   Pulse: 77 64  Resp: 16 10  Temp: 36.4 C 36.2 C    Last Pain:  Vitals:   11/29/15 1009  TempSrc: Tympanic                 Micael Barb K

## 2015-11-30 ENCOUNTER — Encounter: Payer: Self-pay | Admitting: Gastroenterology

## 2015-11-30 ENCOUNTER — Telehealth: Payer: Self-pay

## 2015-11-30 LAB — SURGICAL PATHOLOGY

## 2015-11-30 NOTE — Telephone Encounter (Signed)
Patient notified of follow up appointment with Dr.Woodham on 12/27/15 @ 9:30.

## 2015-12-02 ENCOUNTER — Telehealth: Payer: Self-pay

## 2015-12-02 NOTE — Telephone Encounter (Signed)
Pt notified of colonoscopy results. Health maintenance has been updated to reflect 3 year repeat.

## 2015-12-02 NOTE — Telephone Encounter (Signed)
-----   Message from [name], MD sent at 11/30/2015  1:08 PM EST ----- Tubular adenomas-repeat colonoscopy in 3 years

## 2015-12-09 ENCOUNTER — Emergency Department
Admission: EM | Admit: 2015-12-09 | Discharge: 2015-12-09 | Disposition: A | Discharge status: Home / Self Care | Payer: No Typology Code available for payment source | Attending: Emergency Medicine | Admitting: Emergency Medicine

## 2015-12-09 ENCOUNTER — Emergency Department: Payer: No Typology Code available for payment source

## 2015-12-09 ENCOUNTER — Encounter: Payer: Self-pay | Admitting: Emergency Medicine

## 2015-12-09 DIAGNOSIS — I119 Hypertensive heart disease without heart failure: Secondary | ICD-10-CM | POA: Insufficient documentation

## 2015-12-09 DIAGNOSIS — S63501A Unspecified sprain of right wrist, initial encounter: Secondary | ICD-10-CM

## 2015-12-09 DIAGNOSIS — Z7901 Long term (current) use of anticoagulants: Secondary | ICD-10-CM | POA: Diagnosis not present

## 2015-12-09 DIAGNOSIS — S6991XA Unspecified injury of right wrist, hand and finger(s), initial encounter: Secondary | ICD-10-CM | POA: Diagnosis present

## 2015-12-09 DIAGNOSIS — F17210 Nicotine dependence, cigarettes, uncomplicated: Secondary | ICD-10-CM | POA: Insufficient documentation

## 2015-12-09 DIAGNOSIS — Y92410 Unspecified street and highway as the place of occurrence of the external cause: Secondary | ICD-10-CM | POA: Diagnosis not present

## 2015-12-09 DIAGNOSIS — Y999 Unspecified external cause status: Secondary | ICD-10-CM | POA: Insufficient documentation

## 2015-12-09 DIAGNOSIS — S40011A Contusion of right shoulder, initial encounter: Secondary | ICD-10-CM

## 2015-12-09 DIAGNOSIS — Y939 Activity, unspecified: Secondary | ICD-10-CM | POA: Diagnosis not present

## 2015-12-09 DIAGNOSIS — Z85038 Personal history of other malignant neoplasm of large intestine: Secondary | ICD-10-CM | POA: Diagnosis not present

## 2015-12-09 MED ORDER — TRAMADOL HCL 50 MG PO TABS: 50.0000 mg | ORAL_TABLET | Freq: Four times a day (QID) | ORAL | 0 refills | Status: DC | PRN
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
## 2015-12-09 NOTE — ED Notes (Signed)
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
## 2015-12-09 NOTE — Discharge Instructions (Signed)
Please wear splint as needed for 5-7 days. Ice wrist. Take Tylenol as needed for pain. Return to the ER for any worsening symptoms urgent changes her health. Follow-up with primary care physician in 5-7 days if no improvement.

## 2015-12-27 ENCOUNTER — Encounter: Payer: Self-pay | Admitting: General Surgery

## 2015-12-27 ENCOUNTER — Ambulatory Visit (INDEPENDENT_AMBULATORY_CARE_PROVIDER_SITE_OTHER): Payer: Medicaid Other | Admitting: General Surgery

## 2015-12-27 VITALS — BP 155/87 | HR 62 | Temp 98.8°F | Wt 250.0 lb

## 2015-12-27 DIAGNOSIS — K432 Incisional hernia without obstruction or gangrene: Secondary | ICD-10-CM | POA: Diagnosis not present

## 2015-12-27 NOTE — Progress Notes (Signed)
Outpatient Surgical Follow Up  12/27/2015  James Keller is an 65 y.o. male.   Chief Complaint  Patient presents with  . Follow-up    Discuss Hernia surgery    HPI: 65 year old male returns to clinic for follow-up to discuss his incisional hernias. Since his last visit he has completed his colonoscopy and scheduled MRI for follow-up of a renal cell mass. Patient reports that he only intermittently has some discomfort of the top of his incision where he has an occasional bulge. He is known to have multiple incisional hernias but states only the one at the most cephalad aspect of her causes discomfort. He denies any fevers, chills, nausea, vomiting, chest pain, shortness breath, diarrhea, constipation. He states that he maybe has discomfort one day every other week and is currently without any discomfort. It is never been stuck out and he's never had to push back in.  Past Medical History:  Diagnosis Date  . Colon cancer ([place])    a. 11/2013 s/p colon resection/Hartmann's procedure;  b. Chemo: FOLFOX completed 07/2014.  Marland Kitchen DVT (deep venous thrombosis) ([place])    RIGHT LEG/ HX OF  . DVT (deep venous thrombosis) ([place])   . Dysrhythmia   . Hypertension   . Lower extremity edema    Rt leg  . 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 PAF (paroxysmal atrial fibrillation) ([place])   . S/P chemotherapy, time since less than 4 weeks   . Tobacco abuse     Past Surgical History:  Procedure Laterality Date  . COLON RESECTION  12/19/2013   colostomy  . COLON SURGERY    . COLONOSCOPY WITH PROPOFOL N/A 09/14/2014   Procedure: COLONOSCOPY WITH PROPOFOL THROUGH COLOSTOMY AND COLONOSCOPY THRU RECTUM;  Surgeon: Lucilla Lame, MD;  Location: [facility];  Service: Endoscopy;  Laterality: N/A;  TRANSVERSE COLON POLYP AND SIGMOID COLON POLYP  . COLONOSCOPY WITH PROPOFOL N/A 11/29/2015   Procedure: COLONOSCOPY WITH PROPOFOL;  Surgeon: [name], MD;  Location: [facility];   Service: Endoscopy;  Laterality: N/A;  . COLOSTOMY REVERSAL N/A 10/28/2014   Procedure: COLOSTOMY REVERSAL;  Surgeon: Marlyce Huge, MD;  Location: [facility];  Service: General;  Laterality: N/A;  . FEMUR FRACTURE SURGERY Right   . LEG SURGERY    . LYSIS OF ADHESION  10/28/2014   Procedure: LYSIS OF ADHESION;  Surgeon: Marlyce Huge, MD;  Location: [facility];  Service: General;;  . PORT A CATH INJECTION ([place]  HX) Right   . TUNNELED VENOUS PORT PLACEMENT      Family History  Problem Relation Age of Onset  . Hypertension Mother     Social History:  reports that he has been smoking Cigarettes.  He has a 27.00 pack-year smoking history. He has never used smokeless tobacco. He reports that he drinks about 3.6 oz of alcohol per week . He reports that he uses drugs, including Marijuana.  Allergies:  Allergies  Allergen Reactions  . No Known Allergies     Medications reviewed.    ROS A multipoint review of systems was completed. All pertinent positives and negatives are documented within the history of present illness and remainder are negative.   BP (!) 155/87   Pulse 62   Temp 98.8 F (37.1 C) (Oral)   Wt 113.4 kg (250 lb)   BMI 32.98 kg/m   Physical Exam Gen.: No acute distress Neck: Supple and nontender Chest: Clear to auscultation Heart: Regular rate and rhythm Abdomen: Large,  soft, nontender. Well-healed midline and left upper quadrant ostomy incision site. There are multiple palpable fascial defect. Each fascial defect is approximately 2 cm in greatest diameter. There is one at the most cephalad aspect of midline, one of the umbilicus, one at the prior ostomy site. They are soft and fully reduced. Extremity: Moves all extremities well.    No results found for this or any previous visit (from the past 48 hour(s)). No results found.  Assessment/Plan:  1. Incisional hernia, without obstruction or gangrene 65 year old male with multiple fascial defects  from prior surgeries for colon cancer. Had a long conversation about the surgical options for hernia repair and the indications for surgery for hernia repair. After discussing the surgery detail patient reports that these hernias really do not bother him that much or that often. Discussed the signs and symptoms of incarceration or strangulation and to report to the emergency department immediately should they occur. Otherwise this patient states the long as that is not overly dangerous he wishes to delay surgery at this time. He has a known renal mass that is being followed by semiannual MRIs and will likely eventually require surgery for that. Discussed the potential attempted repair of his hernias of the time of surgery for that. He voiced understanding and will follow-up in clinic on an as-needed basis.     Clayburn Pert, MD FACS General Surgeon  12/27/2015,10:03 AM

## 2015-12-27 NOTE — Patient Instructions (Addendum)
Please give [place] a call if you have any questions or concerns.    Hernia, Adult A hernia is the bulging of an organ or tissue through a weak spot in the muscles of the abdomen (abdominal wall). Hernias develop most often near the navel or groin. There are many kinds of hernias. Common kinds include:  Femoral hernia. This kind of hernia develops under the groin in the upper thigh area.  Inguinal hernia. This kind of hernia develops in the groin or scrotum.  Umbilical hernia. This kind of hernia develops near the navel.  Hiatal hernia. This kind of hernia causes part of the stomach to be pushed up into the chest.  Incisional hernia. This kind of hernia bulges through a scar from an abdominal surgery. What are the causes? This condition may be caused by:  Heavy lifting.  Coughing over a long period of time.  Straining to have a bowel movement.  An incision made during an abdominal surgery.  A birth defect (congenital defect).  Excess weight or obesity.  Smoking.  Poor nutrition.  Cystic fibrosis.  Excess fluid in the abdomen.  Undescended testicles. What are the signs or symptoms? Symptoms of a hernia include:  A lump on the abdomen. This is the first sign of a hernia. The lump may become more obvious with standing, straining, or coughing. It may get bigger over time if it is not treated or if the condition causing it is not treated.  Pain. A hernia is usually painless, but it may become painful over time if treatment is delayed. The pain is usually dull and may get worse with standing or lifting heavy objects. Sometimes a hernia gets tightly squeezed in the weak spot (strangulated) or stuck there (incarcerated) and causes additional symptoms. These symptoms may include:  Vomiting.  Nausea.  Constipation.  Irritability. How is this diagnosed? A hernia may be diagnosed with:  A physical exam. During the exam your health care provider may ask you to cough or to make  a specific movement, because a hernia is usually more visible when you move.  Imaging tests. These can include:  X-rays.  Ultrasound.  CT scan. How is this treated? A hernia that is small and painless may not need to be treated. A hernia that is large or painful may be treated with surgery. Inguinal hernias may be treated with surgery to prevent incarceration or strangulation. Strangulated hernias are always treated with surgery, because lack of blood to the trapped organ or tissue can cause it to die. Surgery to treat a hernia involves pushing the bulge back into place and repairing the weak part of the abdomen. Follow these instructions at home:  Avoid straining.  Do not lift anything heavier than 10 lb (4.5 kg).  Lift with your leg muscles, not your back muscles. This helps avoid strain.  When coughing, try to cough gently.  Prevent constipation. Constipation leads to straining with bowel movements, which can make a hernia worse or cause a hernia repair to break down. You can prevent constipation by:  Eating a high-fiber diet that includes plenty of fruits and vegetables.  Drinking enough fluids to keep your urine clear or pale yellow. Aim to drink 6-8 glasses of water per day.  Using a stool softener as directed by your health care provider.  Lose weight, if you are overweight.  Do not use any tobacco products, including cigarettes, chewing tobacco, or electronic cigarettes. If you need help quitting, ask your health care provider.  Keep all follow-up visits as directed by your health care provider. This is important. Your health care provider may need to monitor your condition. Contact a health care provider if:  You have swelling, redness, and pain in the affected area.  Your bowel habits change. Get help right away if:  You have a fever.  You have abdominal pain that is getting worse.  You feel nauseous or you vomit.  You cannot push the hernia back in place by  gently pressing on it while you are lying down.  The hernia:  Changes in shape or size.  Is stuck outside the abdomen.  Becomes discolored.  Feels hard or tender. This information is not intended to replace advice given to you by your health care provider. Make sure you discuss any questions you have with your health care provider. Document Released: 01/09/2005 Document Revised: 06/09/2015 Document Reviewed: 11/19/2013 Elsevier Interactive Patient Education  2017 [employer].

## 2016-02-01 ENCOUNTER — Inpatient Hospital Stay: Payer: Medicaid Other | Attending: Oncology

## 2016-02-01 DIAGNOSIS — Z85038 Personal history of other malignant neoplasm of large intestine: Secondary | ICD-10-CM | POA: Insufficient documentation

## 2016-02-01 DIAGNOSIS — C772 Secondary and unspecified malignant neoplasm of intra-abdominal lymph nodes: Secondary | ICD-10-CM

## 2016-02-01 DIAGNOSIS — C186 Malignant neoplasm of descending colon: Secondary | ICD-10-CM

## 2016-02-01 LAB — CBC WITH DIFFERENTIAL/PLATELET
BASOS ABS: 0 10*3/uL (ref 0–0.1)
Basophils Relative: 1 %
EOS ABS: 0.2 10*3/uL (ref 0–0.7)
EOS PCT: 4 %
HCT: 43.6 % (ref 40.0–52.0)
HEMOGLOBIN: 14.9 g/dL (ref 13.0–18.0)
LYMPHS ABS: 1.6 10*3/uL (ref 1.0–3.6)
LYMPHS PCT: 35 %
MCH: 29.6 pg (ref 26.0–34.0)
MCHC: 34.1 g/dL (ref 32.0–36.0)
MCV: 87 fL (ref 80.0–100.0)
Monocytes Absolute: 0.4 10*3/uL (ref 0.2–1.0)
Monocytes Relative: 9 %
NEUTROS PCT: 51 %
Neutro Abs: 2.3 10*3/uL (ref 1.4–6.5)
PLATELETS: 178 10*3/uL (ref 150–440)
RBC: 5.01 MIL/uL (ref 4.40–5.90)
RDW: 15 % — ABNORMAL HIGH (ref 11.5–14.5)
WBC: 4.6 10*3/uL (ref 3.8–10.6)

## 2016-02-02 LAB — CEA: CEA: 6.4 ng/mL — AB (ref 0.0–4.7)

## 2016-05-01 ENCOUNTER — Inpatient Hospital Stay: Payer: Medicaid Other | Attending: Oncology

## 2016-05-01 ENCOUNTER — Ambulatory Visit
Admission: RE | Admit: 2016-05-01 | Discharge: 2016-05-01 | Disposition: A | Discharge status: Home / Self Care | Payer: Medicaid Other | Source: Ambulatory Visit | Attending: Oncology | Admitting: Oncology

## 2016-05-01 DIAGNOSIS — C772 Secondary and unspecified malignant neoplasm of intra-abdominal lymph nodes: Secondary | ICD-10-CM

## 2016-05-01 DIAGNOSIS — I480 Paroxysmal atrial fibrillation: Secondary | ICD-10-CM | POA: Insufficient documentation

## 2016-05-01 DIAGNOSIS — F17210 Nicotine dependence, cigarettes, uncomplicated: Secondary | ICD-10-CM | POA: Insufficient documentation

## 2016-05-01 DIAGNOSIS — C186 Malignant neoplasm of descending colon: Secondary | ICD-10-CM

## 2016-05-01 DIAGNOSIS — Z9221 Personal history of antineoplastic chemotherapy: Secondary | ICD-10-CM | POA: Insufficient documentation

## 2016-05-01 DIAGNOSIS — G629 Polyneuropathy, unspecified: Secondary | ICD-10-CM | POA: Diagnosis not present

## 2016-05-01 DIAGNOSIS — Z86718 Personal history of other venous thrombosis and embolism: Secondary | ICD-10-CM | POA: Insufficient documentation

## 2016-05-01 DIAGNOSIS — R609 Edema, unspecified: Secondary | ICD-10-CM | POA: Diagnosis not present

## 2016-05-01 DIAGNOSIS — Z85038 Personal history of other malignant neoplasm of large intestine: Secondary | ICD-10-CM | POA: Diagnosis present

## 2016-05-01 DIAGNOSIS — N281 Cyst of kidney, acquired: Secondary | ICD-10-CM | POA: Insufficient documentation

## 2016-05-01 DIAGNOSIS — Z79899 Other long term (current) drug therapy: Secondary | ICD-10-CM | POA: Insufficient documentation

## 2016-05-01 DIAGNOSIS — N2889 Other specified disorders of kidney and ureter: Secondary | ICD-10-CM | POA: Insufficient documentation

## 2016-05-01 DIAGNOSIS — Z7901 Long term (current) use of anticoagulants: Secondary | ICD-10-CM | POA: Diagnosis not present

## 2016-05-01 DIAGNOSIS — I10 Essential (primary) hypertension: Secondary | ICD-10-CM | POA: Insufficient documentation

## 2016-05-01 LAB — POCT I-STAT CREATININE: CREATININE: 1.8 mg/dL — AB (ref 0.61–1.24)

## 2016-05-01 LAB — CBC WITH DIFFERENTIAL/PLATELET
BASOS PCT: 2 %
Basophils Absolute: 0.1 10*3/uL (ref 0–0.1)
EOS ABS: 0.2 10*3/uL (ref 0–0.7)
Eosinophils Relative: 4 %
HCT: 39.3 % — ABNORMAL LOW (ref 40.0–52.0)
HEMOGLOBIN: 13.6 g/dL (ref 13.0–18.0)
LYMPHS ABS: 1.1 10*3/uL (ref 1.0–3.6)
Lymphocytes Relative: 26 %
MCH: 30 pg (ref 26.0–34.0)
MCHC: 34.6 g/dL (ref 32.0–36.0)
MCV: 86.7 fL (ref 80.0–100.0)
MONO ABS: 0.4 10*3/uL (ref 0.2–1.0)
MONOS PCT: 9 %
Neutro Abs: 2.4 10*3/uL (ref 1.4–6.5)
Neutrophils Relative %: 59 %
Platelets: 190 10*3/uL (ref 150–440)
RBC: 4.53 MIL/uL (ref 4.40–5.90)
RDW: 15.8 % — ABNORMAL HIGH (ref 11.5–14.5)
WBC: 4 10*3/uL (ref 3.8–10.6)

## 2016-05-01 MED ORDER — GADOBENATE DIMEGLUMINE 529 MG/ML IV SOLN
20.0000 mL | Freq: Once | INTRAVENOUS | Status: AC | PRN
  Administered 2016-05-01: 20 mL via INTRAVENOUS

## 2016-05-02 LAB — CEA: CEA: 6.3 ng/mL — AB (ref 0.0–4.7)

## 2016-05-08 ENCOUNTER — Inpatient Hospital Stay (HOSPITAL_BASED_OUTPATIENT_CLINIC_OR_DEPARTMENT_OTHER): Payer: Medicaid Other | Admitting: Oncology

## 2016-05-08 VITALS — BP 156/91 | HR 64 | Temp 97.0°F | Resp 18 | Wt 247.5 lb

## 2016-05-08 DIAGNOSIS — I10 Essential (primary) hypertension: Secondary | ICD-10-CM

## 2016-05-08 DIAGNOSIS — R609 Edema, unspecified: Secondary | ICD-10-CM

## 2016-05-08 DIAGNOSIS — C186 Malignant neoplasm of descending colon: Secondary | ICD-10-CM

## 2016-05-08 DIAGNOSIS — N2889 Other specified disorders of kidney and ureter: Secondary | ICD-10-CM | POA: Diagnosis not present

## 2016-05-08 DIAGNOSIS — F17210 Nicotine dependence, cigarettes, uncomplicated: Secondary | ICD-10-CM

## 2016-05-08 DIAGNOSIS — Z85038 Personal history of other malignant neoplasm of large intestine: Secondary | ICD-10-CM

## 2016-05-08 DIAGNOSIS — Z7901 Long term (current) use of anticoagulants: Secondary | ICD-10-CM

## 2016-05-08 DIAGNOSIS — G629 Polyneuropathy, unspecified: Secondary | ICD-10-CM | POA: Diagnosis not present

## 2016-05-08 DIAGNOSIS — Z86718 Personal history of other venous thrombosis and embolism: Secondary | ICD-10-CM

## 2016-05-08 DIAGNOSIS — Z79899 Other long term (current) drug therapy: Secondary | ICD-10-CM | POA: Diagnosis not present

## 2016-05-08 DIAGNOSIS — C772 Secondary and unspecified malignant neoplasm of intra-abdominal lymph nodes: Principal | ICD-10-CM

## 2016-05-08 DIAGNOSIS — Z9221 Personal history of antineoplastic chemotherapy: Secondary | ICD-10-CM

## 2016-05-08 DIAGNOSIS — I480 Paroxysmal atrial fibrillation: Secondary | ICD-10-CM

## 2016-05-08 NOTE — Progress Notes (Signed)
James Keller  [phone] [phone]) [phone]  ID: SEBASTHIAN STAILEY OB: 08-Jun-1950  MR#: [id number]  KDT#:[id number]  Patient Care Team: Herminio Commons, MD as PCP - General (Family Medicine) Marlyce Huge, MD as Attending Physician (Surgery) Lucilla Lame, MD as Consulting Physician (Gastroenterology)  CHIEF COMPLAINT: Stage IIIB adenocarcinoma of the desending colon, right kidney mass.  INTERVAL HISTORY: Patient returns to clinic today for routine six-month evaluation and discussion of his imaging results. He continues to feel well and is asymptomatic. He has a mild peripheral neuropathy but it does not affect his day-to-day activity. He denies any fevers or illnesses. He has no other neurologic complaints. He has no chest pain or shortness of breath. He denies any weight loss. He denies any easy bleeding or bruising. He has no nausea, vomiting, diarrhea, or constipation. Patient offers no specific complaints today.   REVIEW OF SYSTEMS:   Review of Systems  Constitutional: Negative.  Negative for fever, malaise/fatigue and weight loss.  Respiratory: Negative.  Negative for shortness of breath.   Cardiovascular: Negative.  Negative for chest pain.  Gastrointestinal: Negative for abdominal pain, blood in stool and melena.  Genitourinary: Negative for frequency, hematuria and urgency.  Musculoskeletal: Negative.   Neurological: Positive for sensory change. Negative for weakness.  Psychiatric/Behavioral: Negative.  The patient is not nervous/anxious.     As per HPI. Otherwise, a complete review of systems is negative.  PAST MEDICAL HISTORY: Past Medical History:  Diagnosis Date  . Colon cancer ([place])    a. 11/2013 s/p colon resection/Hartmann's procedure;  b. Chemo: FOLFOX completed 07/2014.  Marland Kitchen DVT (deep venous thrombosis) ([place])    RIGHT LEG/ HX OF  . DVT (deep venous thrombosis) ([place])   . Dysrhythmia   . Hypertension   . Lower extremity  edema    Rt leg  . 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 PAF (paroxysmal atrial fibrillation) ([place])   . S/P chemotherapy, time since less than 4 weeks   . Tobacco abuse     PAST SURGICAL HISTORY: Past Surgical History:  Procedure Laterality Date  . COLON RESECTION  12/19/2013   colostomy  . COLON SURGERY    . COLONOSCOPY WITH PROPOFOL N/A 09/14/2014   Procedure: COLONOSCOPY WITH PROPOFOL THROUGH COLOSTOMY AND COLONOSCOPY THRU RECTUM;  Surgeon: Lucilla Lame, MD;  Location: [facility];  Service: Endoscopy;  Laterality: N/A;  TRANSVERSE COLON POLYP AND SIGMOID COLON POLYP  . COLONOSCOPY WITH PROPOFOL N/A 11/29/2015   Procedure: COLONOSCOPY WITH PROPOFOL;  Surgeon: [name], MD;  Location: [facility];  Service: Endoscopy;  Laterality: N/A;  . COLOSTOMY REVERSAL N/A 10/28/2014   Procedure: COLOSTOMY REVERSAL;  Surgeon: Marlyce Huge, MD;  Location: [facility];  Service: General;  Laterality: N/A;  . FEMUR FRACTURE SURGERY Right   . LEG SURGERY    . LYSIS OF ADHESION  10/28/2014   Procedure: LYSIS OF ADHESION;  Surgeon: Marlyce Huge, MD;  Location: [facility];  Service: General;;  . PORT A CATH INJECTION ([place] HX) Right   . TUNNELED VENOUS PORT PLACEMENT      FAMILY HISTORY: Grandmother with "liver cancer".     ADVANCED DIRECTIVES:    HEALTH MAINTENANCE: Social History  Substance Use Topics  . Smoking status: Current Some Day Smoker    Packs/day: 0.50    Years: 54.00    Types: Cigarettes  . Smokeless tobacco: Never Used  . Alcohol use 3.6 oz/week    6 Cans of  beer per week     Comment: moderate beer     Colonoscopy:  PAP:  Bone density:  Lipid panel:  Allergies  Allergen Reactions  . No Known Allergies     Current Outpatient Prescriptions  Medication Sig Dispense Refill  . amiodarone (PACERONE) 200 MG tablet Take 200 mg by mouth daily.     Marland Kitchen amLODipine (NORVASC) 10 MG tablet Take 1 tablet by mouth daily.    Marland Kitchen  gabapentin (NEURONTIN) 300 MG capsule Take 1 capsule (300 mg total) by mouth 3 (three) times daily. 90 capsule 1  . lisinopril (PRINIVIL,ZESTRIL) 20 MG tablet Take 1 tablet by mouth daily. Reported on 08/12/2015    . metoprolol tartrate (LOPRESSOR) 25 MG tablet Take 25 mg by mouth 2 (two) times daily. Am and pm    . warfarin (COUMADIN) 1 MG tablet Take 1 mg by mouth daily. Take on MWF with 5mg  tablet for total dose of 6mg  on MWF     No current facility-administered medications for this visit.    Facility-Administered Medications Ordered in Other Visits  Medication Dose Route Frequency Provider Last Rate Last Dose  . sodium chloride 0.9 % injection 10 mL  10 mL Intracatheter PRN Lloyd Huger, MD   10 mL at 10/19/14 1353    OBJECTIVE: Vitals:   05/08/16 1104  BP: (!) 156/91  Pulse: 64  Resp: 18  Temp: 97 F (36.1 C)     Body mass index is 32.65 kg/m.    ECOG FS:0 - Asymptomatic  General: Well-developed, well-nourished, no acute distress. Eyes: anicteric sclera. Lungs: Clear to auscultation bilaterally. Heart: Regular rate and rhythm. No rubs, murmurs, or gallops. Abdomen: Soft, nontender, nondistended. No organomegaly noted, normoactive bowel sounds. Musculoskeletal: No edema, cyanosis, or clubbing. Neuro: Alert, answering all questions appropriately. Cranial nerves grossly intact. Skin: No rashes or petechiae noted. Psych: Normal affect.    LAB RESULTS:  Lab Results  Component Value Date   NA 139 10/28/2015   K 4.3 10/28/2015   CL 105 10/28/2015   CO2 28 10/28/2015   GLUCOSE 98 10/28/2015   BUN 24 (H) 10/28/2015   CREATININE 1.80 (H) 05/01/2016   CALCIUM 9.2 10/28/2015   PROT 7.2 09/07/2015   ALBUMIN 4.2 09/07/2015   AST 20 09/07/2015   ALT 22 09/07/2015   ALKPHOS 51 09/07/2015   BILITOT 0.7 09/07/2015   GFRNONAA 40 (L) 10/28/2015   GFRAA 47 (L) 10/28/2015    Lab Results  Component Value Date   WBC 4.0 05/01/2016   NEUTROABS 2.4 05/01/2016   HGB 13.6  05/01/2016   HCT 39.3 (L) 05/01/2016   MCV 86.7 05/01/2016   PLT 190 05/01/2016   Lab Results  Component Value Date   CEA 6.3 (H) 05/01/2016      STUDIES:    Mr Abdomen W Wo Contrast  Result Date: 05/01/2016 CLINICAL DATA:  Six-month follow-up for renal cell carcinoma. EXAM: MRI ABDOMEN WITHOUT AND WITH CONTRAST TECHNIQUE: Multiplanar multisequence MR imaging of the abdomen was performed both before and after the administration of intravenous contrast. CONTRAST:  27mL MULTIHANCE GADOBENATE DIMEGLUMINE 529 MG/ML IV SOLN COMPARISON:  MRI 10/28/2015 FINDINGS: Lower chest:  Lung bases are clear. Hepatobiliary: No focal hepatic lesion.  No duct dilatation. Pancreas: Normal pancreatic parenchymal intensity. No ductal dilatation or inflammation. Spleen: Normal spleen. Adrenals/urinary tract: Within the RIGHT kidney cortical lesion measures 2.5 x 2.0 cm (image 35, series 18 compared with 2.4 x 2.0 cm on comparison exam for no significant interval  change. Lesion continues demonstrate a dominant cystic with enhancing central nodule (image 14, series 4, image 35, series 14. No new lesions are identified. Several small nonenhancing cysts are noted within the LEFT kidney. Stomach/Bowel: Stomach and limited of the small bowel is unremarkable Vascular/Lymphatic: Abdominal aortic normal caliber. No retroperitoneal periportal lymphadenopathy. Musculoskeletal: No aggressive osseous lesion IMPRESSION: 1. Stable cystic lesion within the RIGHT kidney with mild central nodular enhancement most concerning for a cystic renal neoplasm. 2. Bosniak 1 cysts of the LEFT kidney. Electronically Signed   By: Suzy Bouchard M.D.   On: 05/01/2016 13:42    ASSESSMENT: Stage IIIB adenocarcinoma of the desending colon, DVT, right kidney mass.  PLAN:    1. Stage IIIB adenocarcinoma of the desending colon: Patient completed 12 cycles of FOLFOX on July 29, 2014. CEA is mildly elevated at 6.3, but essentially unchanged. CT scan  results from November 08, 2014 reviewed independently with no evidence of disease. Patient had a colonoscopy on November 29, 2015 that was reported as normal. Return to clinic in 6 months for laboratory work and further evaluation.  2. H/o DVT: Continue Coumadin. Patient's INR is monitored by primary care.  3. Elevated creatinine: Patient's creatinine is approximately his baseline, monitor.  4. Neuropathy: Continue 300 mg gabapentin 3 times a day. 5. Right kidney mass: MRI results from May 11, 2016 reviewed independently and reported as above. Lesion is unchanged, but remains suspicious for renal cell carcinoma. No intervention is needed at this time, repeat MRI in 6 months. Will refer back to urology if there are any questions or concerns.  Patient expressed understanding and was in agreement with this plan. He also understands that He can call clinic at any time with any questions, concerns, or complaints.   Colon cancer   Staging form: Colon/Rectum, AJCC 7th Edition     Pathologic stage from 05/04/2014: Stage IIIB (T4, N1, M0) - Signed by Lloyd Huger, MD on 05/04/2014   Lloyd Huger, MD   05/14/2016 7:54 AM

## 2016-05-08 NOTE — Progress Notes (Signed)
Offers no complaints  

## 2016-08-24 IMAGING — US US ART/VEN ABD/PELV/SCROTUM DOPPLER LTD
1 series · 13 of 25 positions shown · non-contrast
Comparison: None.

CLINICAL DATA: Testicular pain.

EXAM:
SCROTAL ULTRASOUND
DOPPLER ULTRASOUND OF THE TESTICLES
TECHNIQUE: Complete ultrasound examination of the testicles, epididymis, and
other scrotal structures was performed. Color and spectral Doppler
ultrasound were also utilized to evaluate blood flow to the
testicles.

[Series 1: us art/ven abd/pelv/scrotum doppler ltd · 0.08mm/px · 13 of 129 slices shown]
[im 1/129]
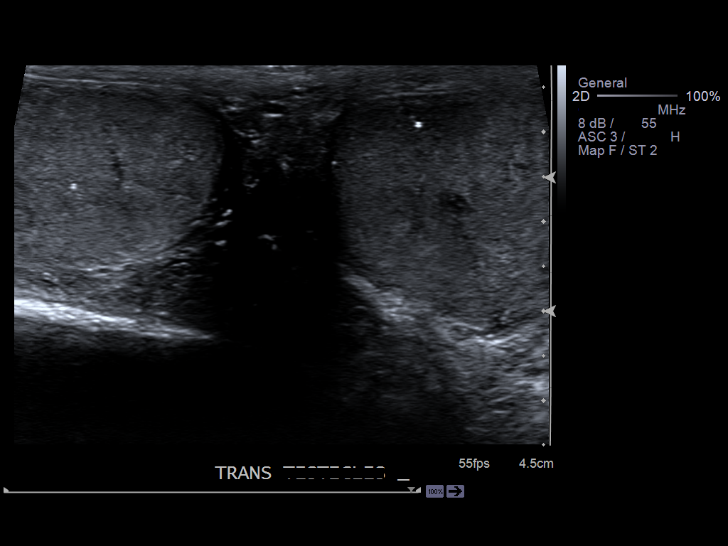
[im 11/129]
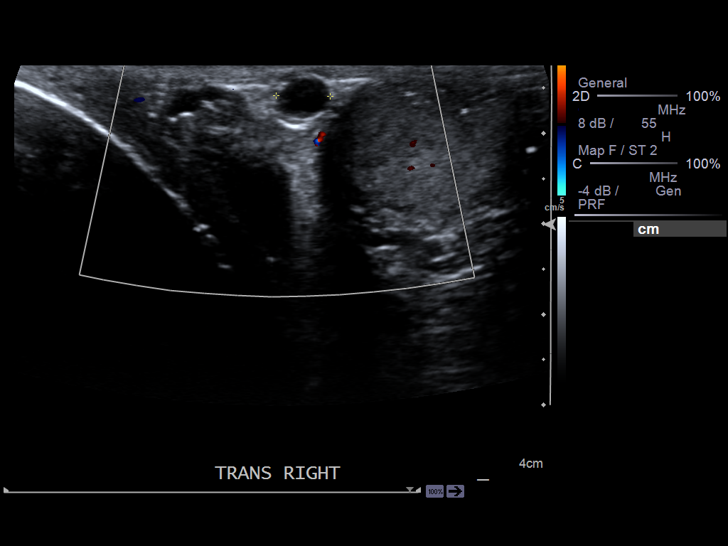
[im 22/129]
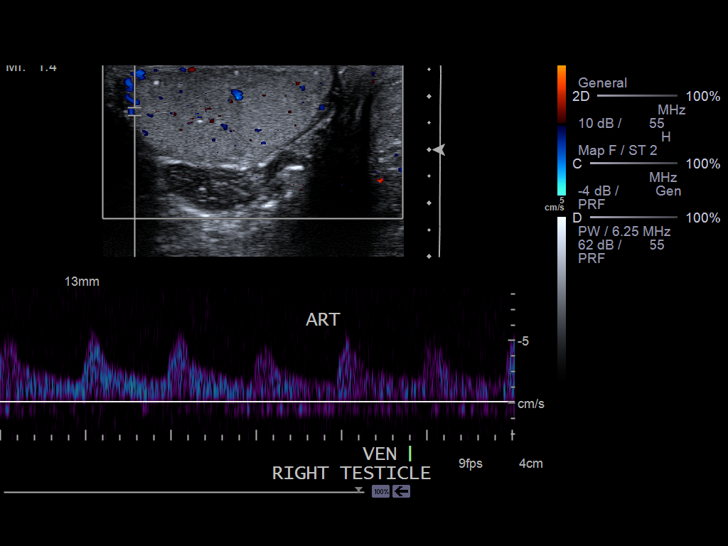
[im 33/129]
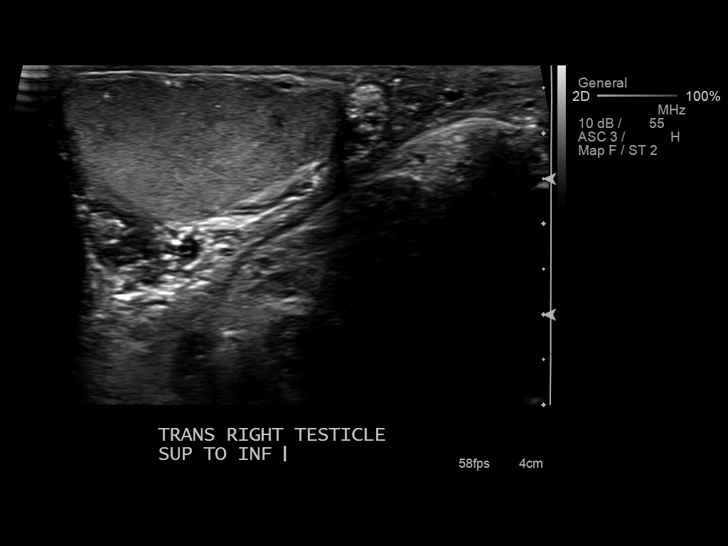
[im 43/129]
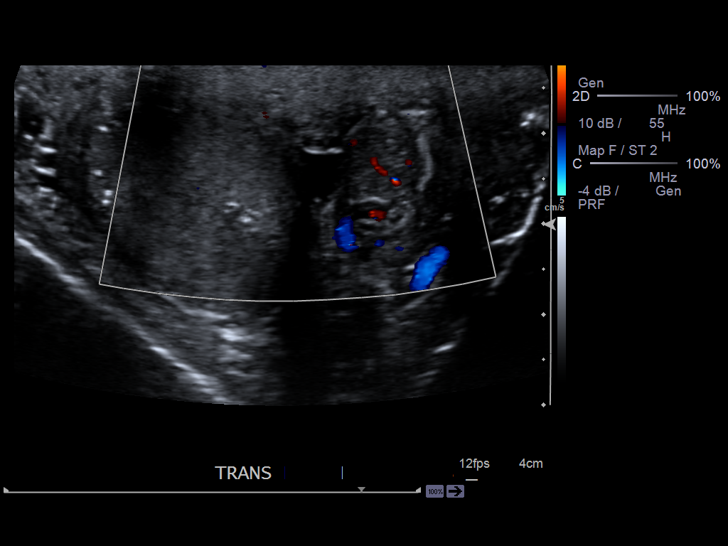
[im 54/129]
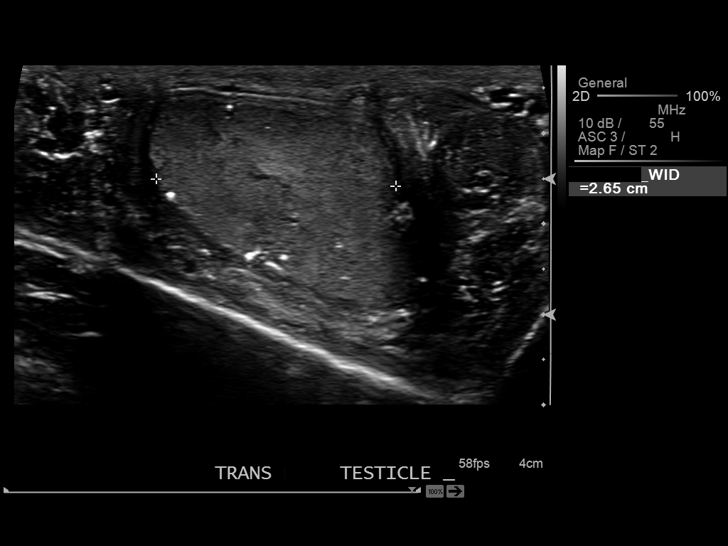
[im 65/129]
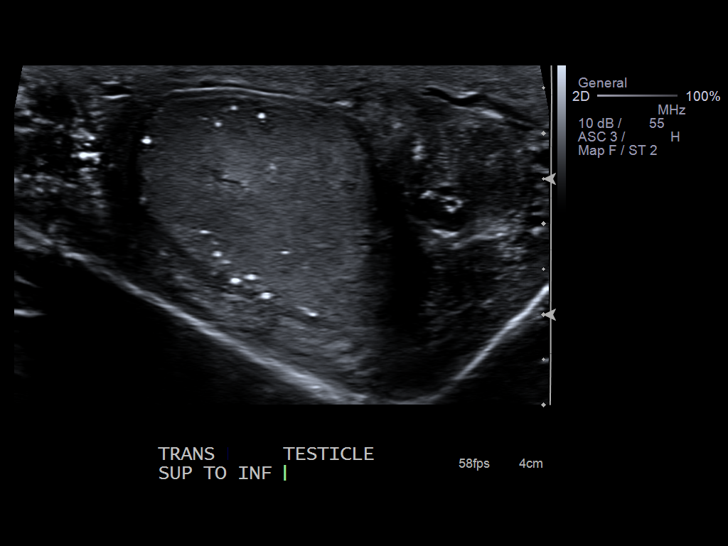
[im 75/129]
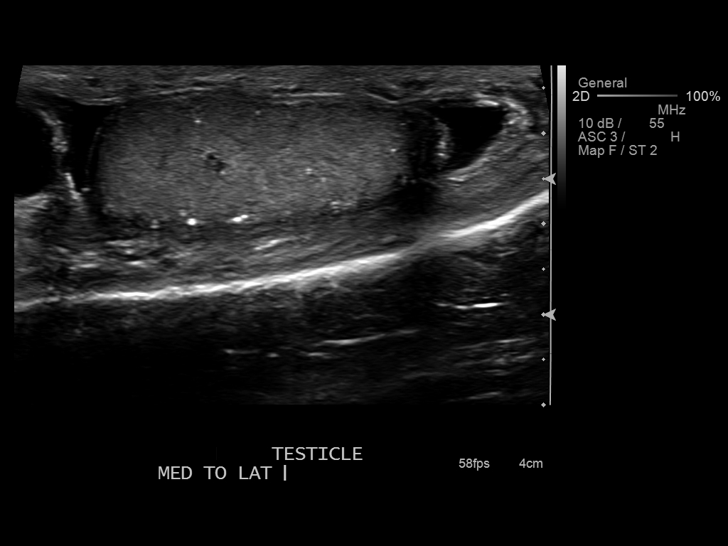
[im 86/129]
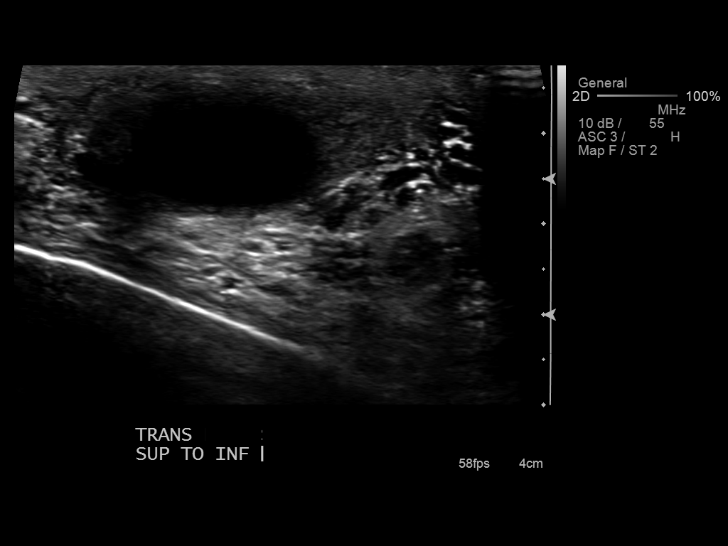
[im 97/129]
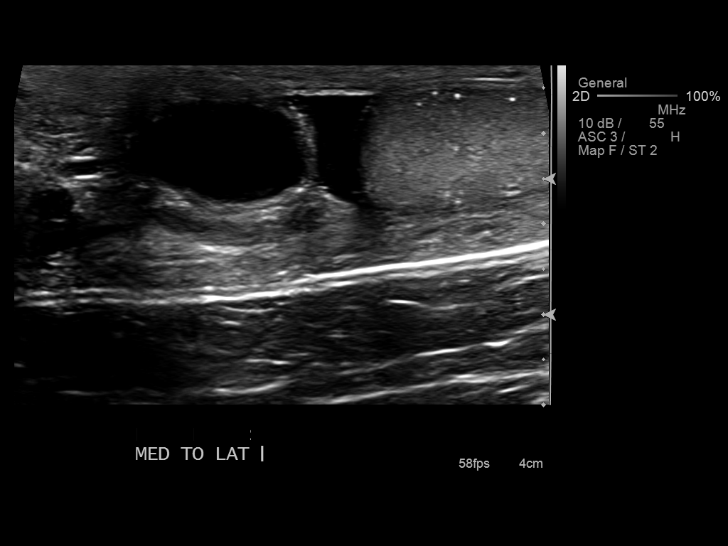
[im 107/129]
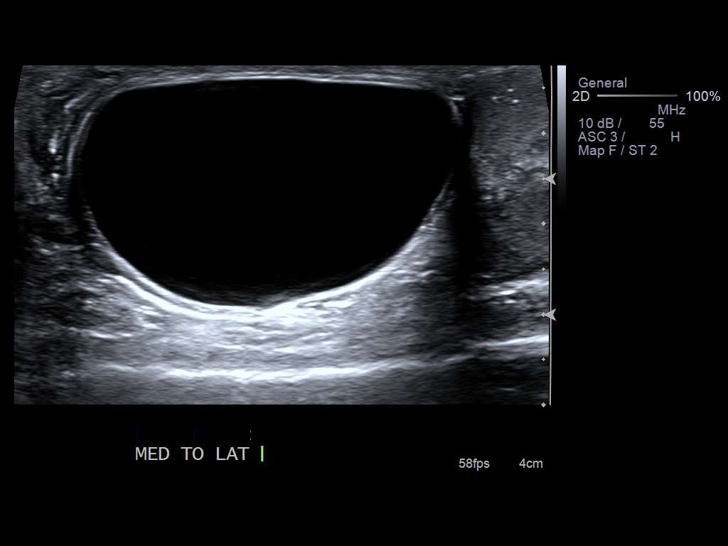
[im 118/129]
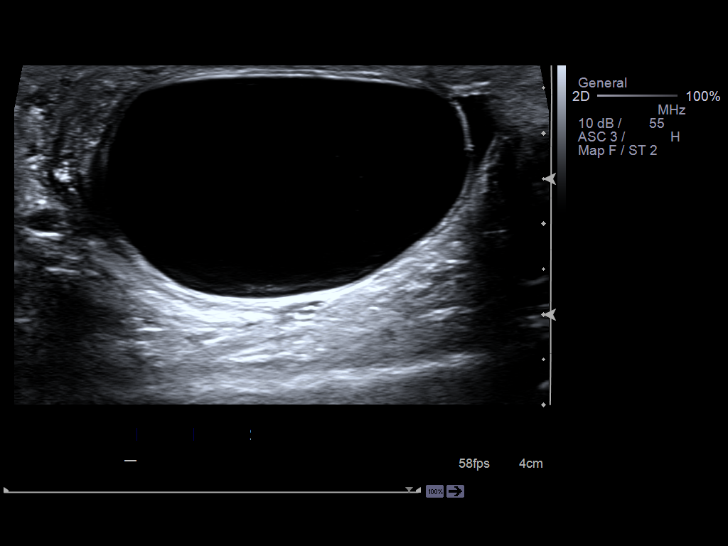
[im 129/129]
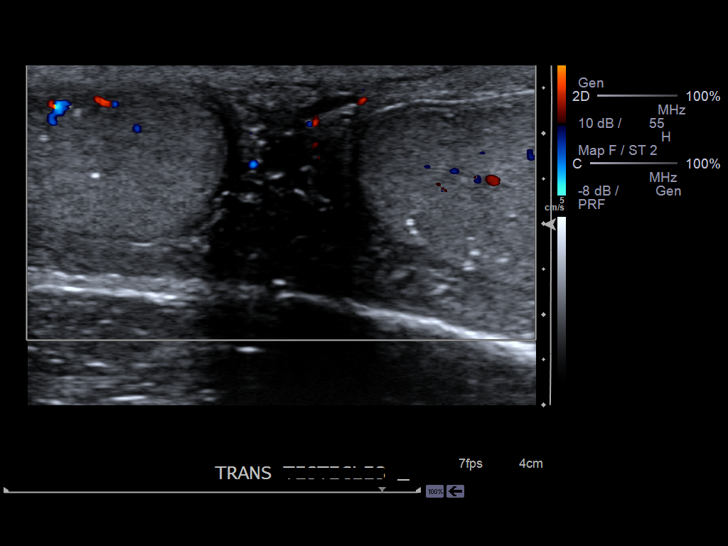

[13 of 25 positions shown; findings below may reference images not displayed]

FINDINGS: Right testicle

Measurements: 4.8 x 1.8 x 3 cm. No testicular mass. Testicular
microlithiasis.

Left testicle

Measurements: 4.4 x 1.5 x 2.7 cm. No testicular mass. Testicular
microlithiasis.

Right epididymis: Normal in size and appearance. 6 x 5 x 6 mm
anechoic right epididymal mass most consistent with a cyst.

Left epididymis: Normal in size and echogenicity. 4.1 x 2.5 x 3.5 cm
focal anechoic area adjacent to the epididymis which may reflect a
large epididymal cyst or spermatocele versus loculated hydrocele.

Hydrocele:  No right hydrocele.

Varicocele:  None visualized.

Pulsed Doppler interrogation of both testes demonstrates normal low
resistance arterial and venous waveforms bilaterally.
IMPRESSION: 1. No testicular torsion.
2. Large left epididymal cyst or spermatocele versus loculated
hydrocele.
3. Bilateral testicular microlithiasis. Current literature suggests
that testicular microlithiasis is not a significant independent risk
factor for development of testicular carcinoma, and that follow up
imaging is not warranted in the absence of other risk factors.
Monthly testicular self-examination and annual physical exams are
considered appropriate surveillance. If patient has other risk
factors for testicular carcinoma, then referral to Urology should be
considered. (Reference: Etsuo, et al.: A 5-Year Follow up Study
of Asymptomatic Men with Testicular Microlithiasis. J Urol 3886;

## 2016-09-22 IMAGING — CR DG CHEST 1V PORT
1 series · 1 of 1 positions shown · non-contrast
Comparison: 10/21/2014

CLINICAL DATA: PICC line placement

EXAM:
PORTABLE CHEST 1 VIEW

[ap]
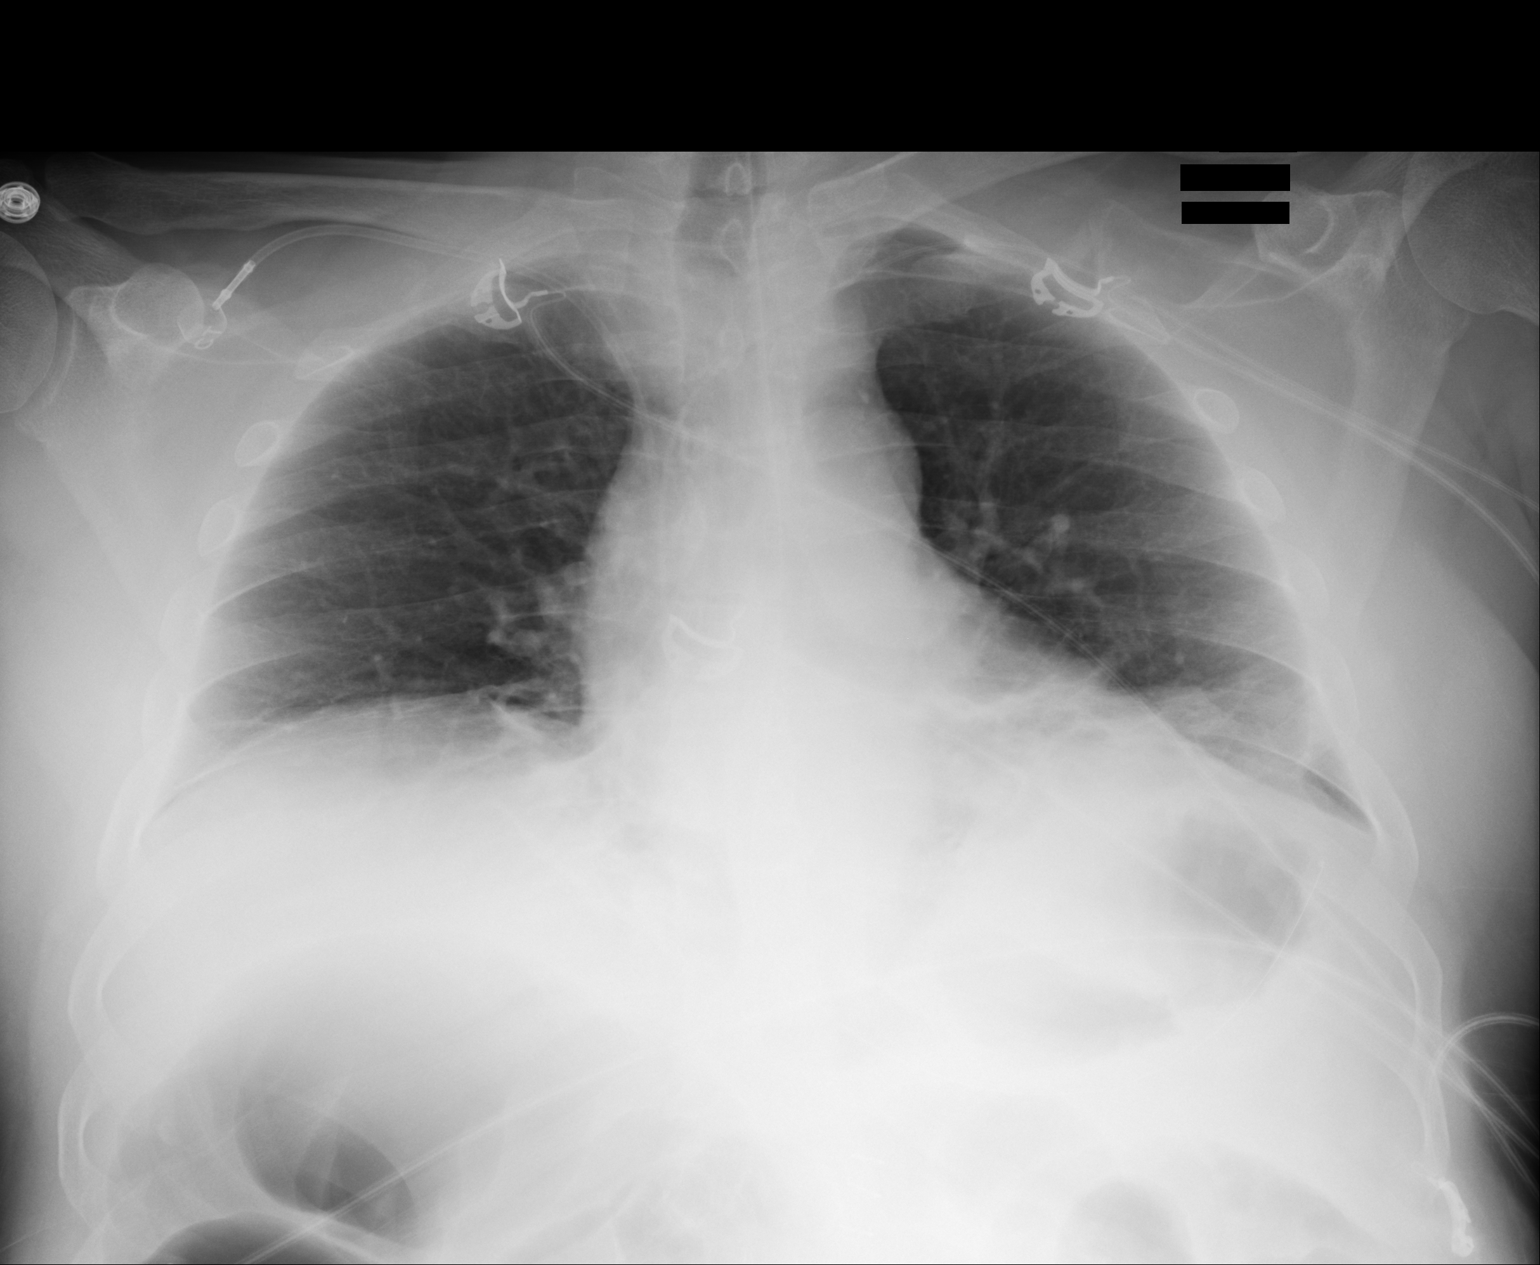

[1 of 1 positions shown; findings below may reference images not displayed]

FINDINGS: Right Port-A-Cath remains in place, unchanged. Interval placement of
left PICC line with the tip in the SVC. Very low lung volumes with
bibasilar opacities, likely atelectasis. No definite effusions. No
acute bony abnormality. Heart size is accentuated by the low
volumes, likely within normal limits.
IMPRESSION: Low lung volumes, bibasilar atelectasis.

Left PICC line tip in the SVC.

## 2016-09-23 IMAGING — CR DG ABDOMEN 1V
1 series · 2 of 2 positions shown · non-contrast
Comparison: 11/04/2014.

CLINICAL DATA: Ileus.

EXAM:
ABDOMEN - 1 VIEW

[Series 1: ap · 0.17mm/px · 2 of 2 slices shown]
[im 1/2]
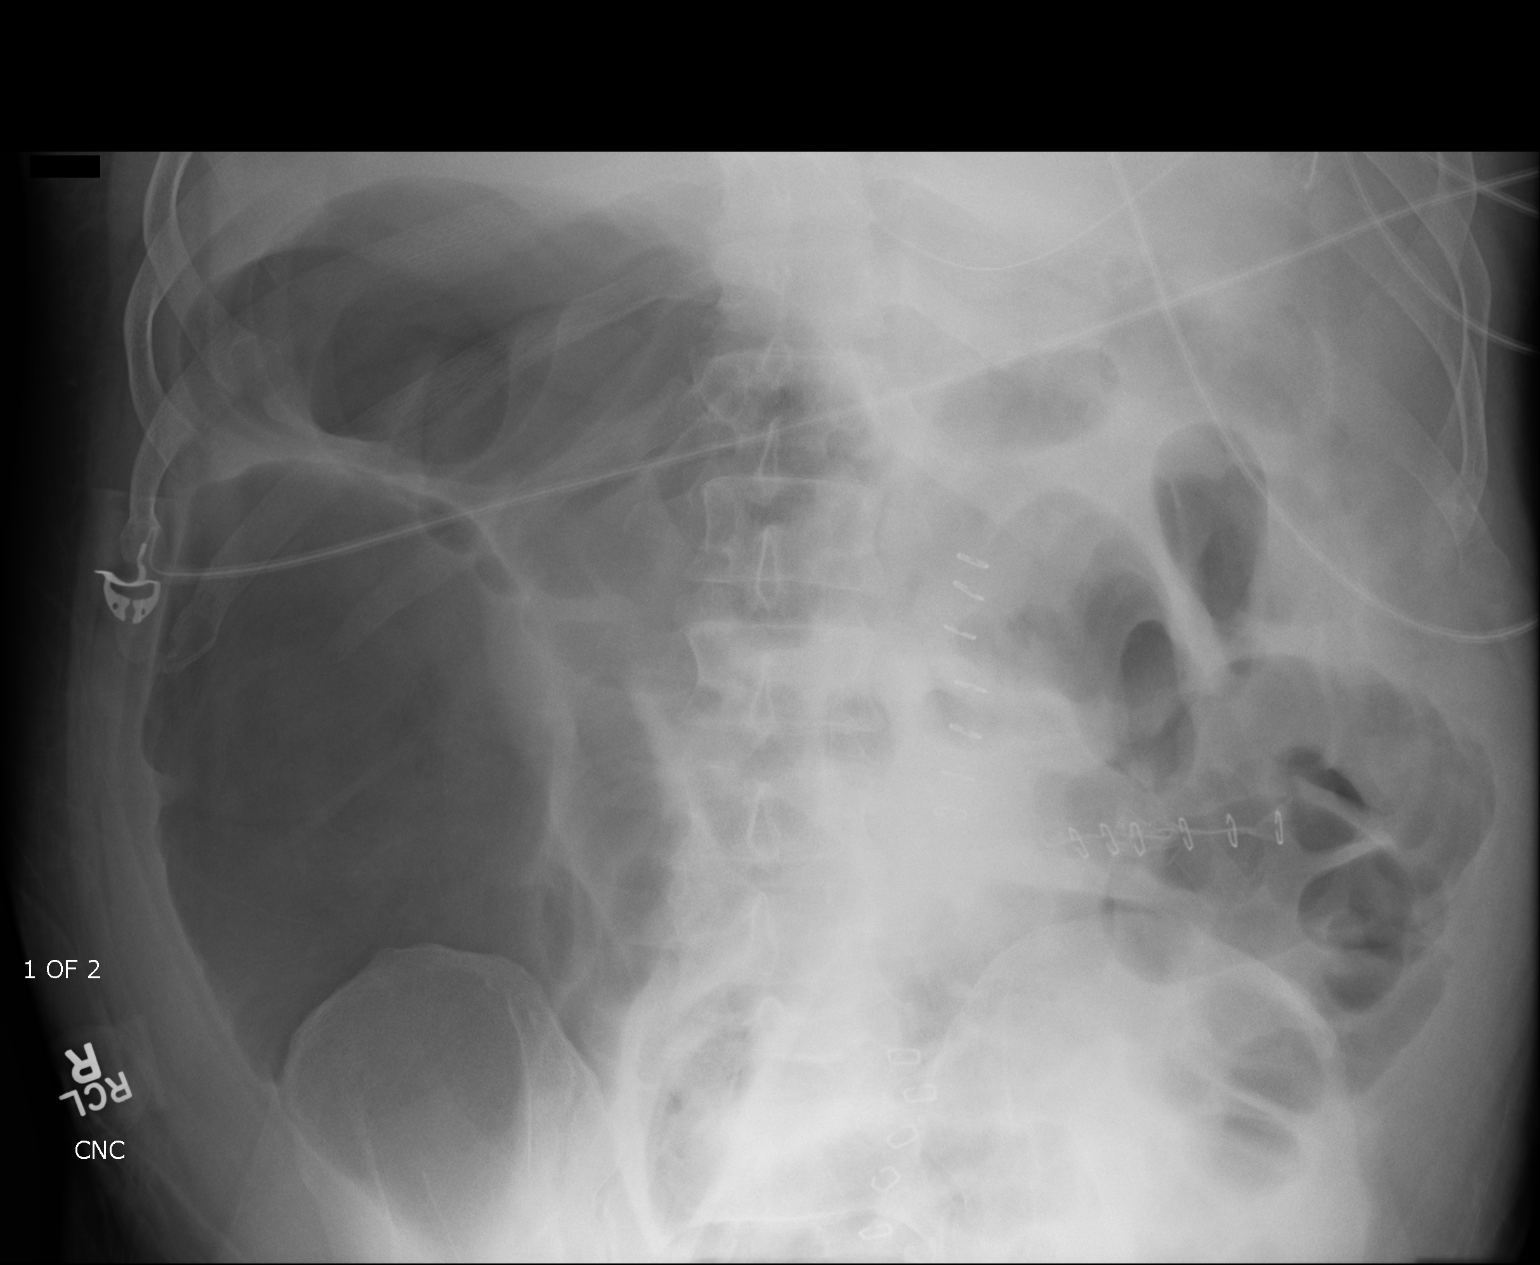
[im 2/2]
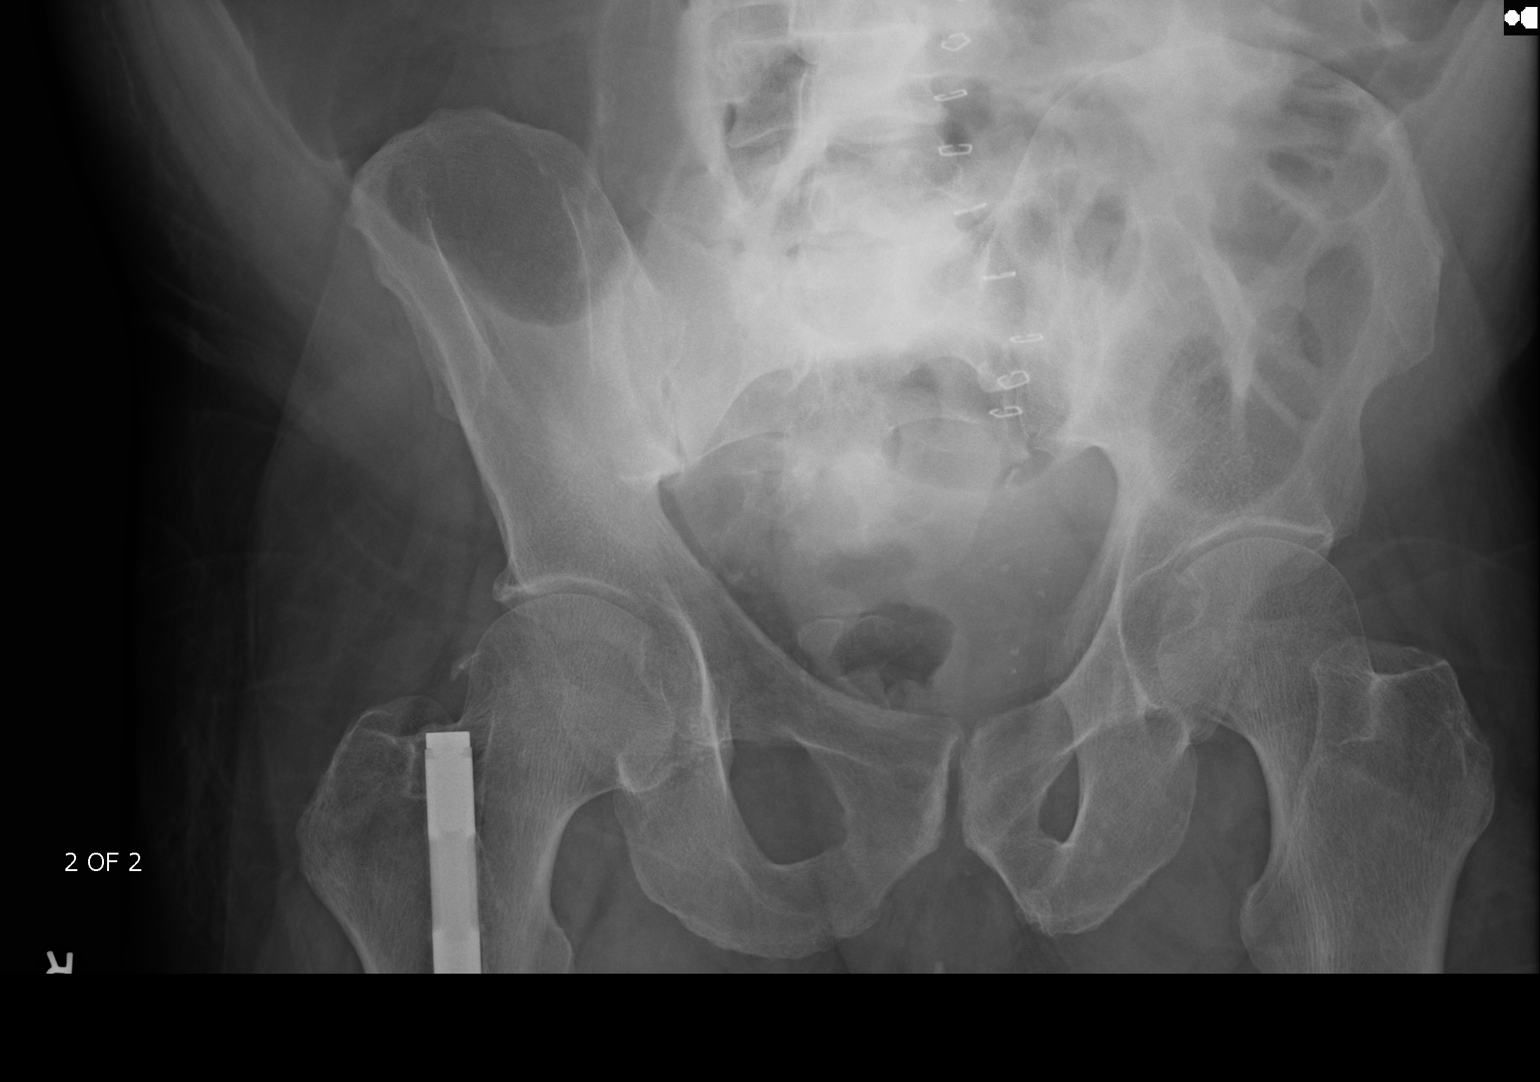

[2 of 2 positions shown; findings below may reference images not displayed]

FINDINGS: NG tube noted in stable position. Persistent dilated loops of small
and large bowel noted consistent adynamic ileus. Minimal
improvement. No free air. Surgical staples over the abdomen. ORIF
right hip.
IMPRESSION: 1. NG tube in stable position.
2. Persistent dilated loops of small and large bowel consistent with
adynamic ileus. Minimal improvement. No free air.

## 2016-09-25 ENCOUNTER — Ambulatory Visit: Payer: Medicaid Other

## 2016-09-26 ENCOUNTER — Ambulatory Visit: Payer: Medicaid Other

## 2016-09-26 IMAGING — CR DG ABDOMEN 1V
1 series · 3 of 3 positions shown · non-contrast
Comparison: 11/05/2014

CLINICAL DATA: Postop ileus

EXAM:
ABDOMEN - 1 VIEW

[Series 1: ap · 0.17mm/px · 3 of 3 slices shown]
[im 1/3]
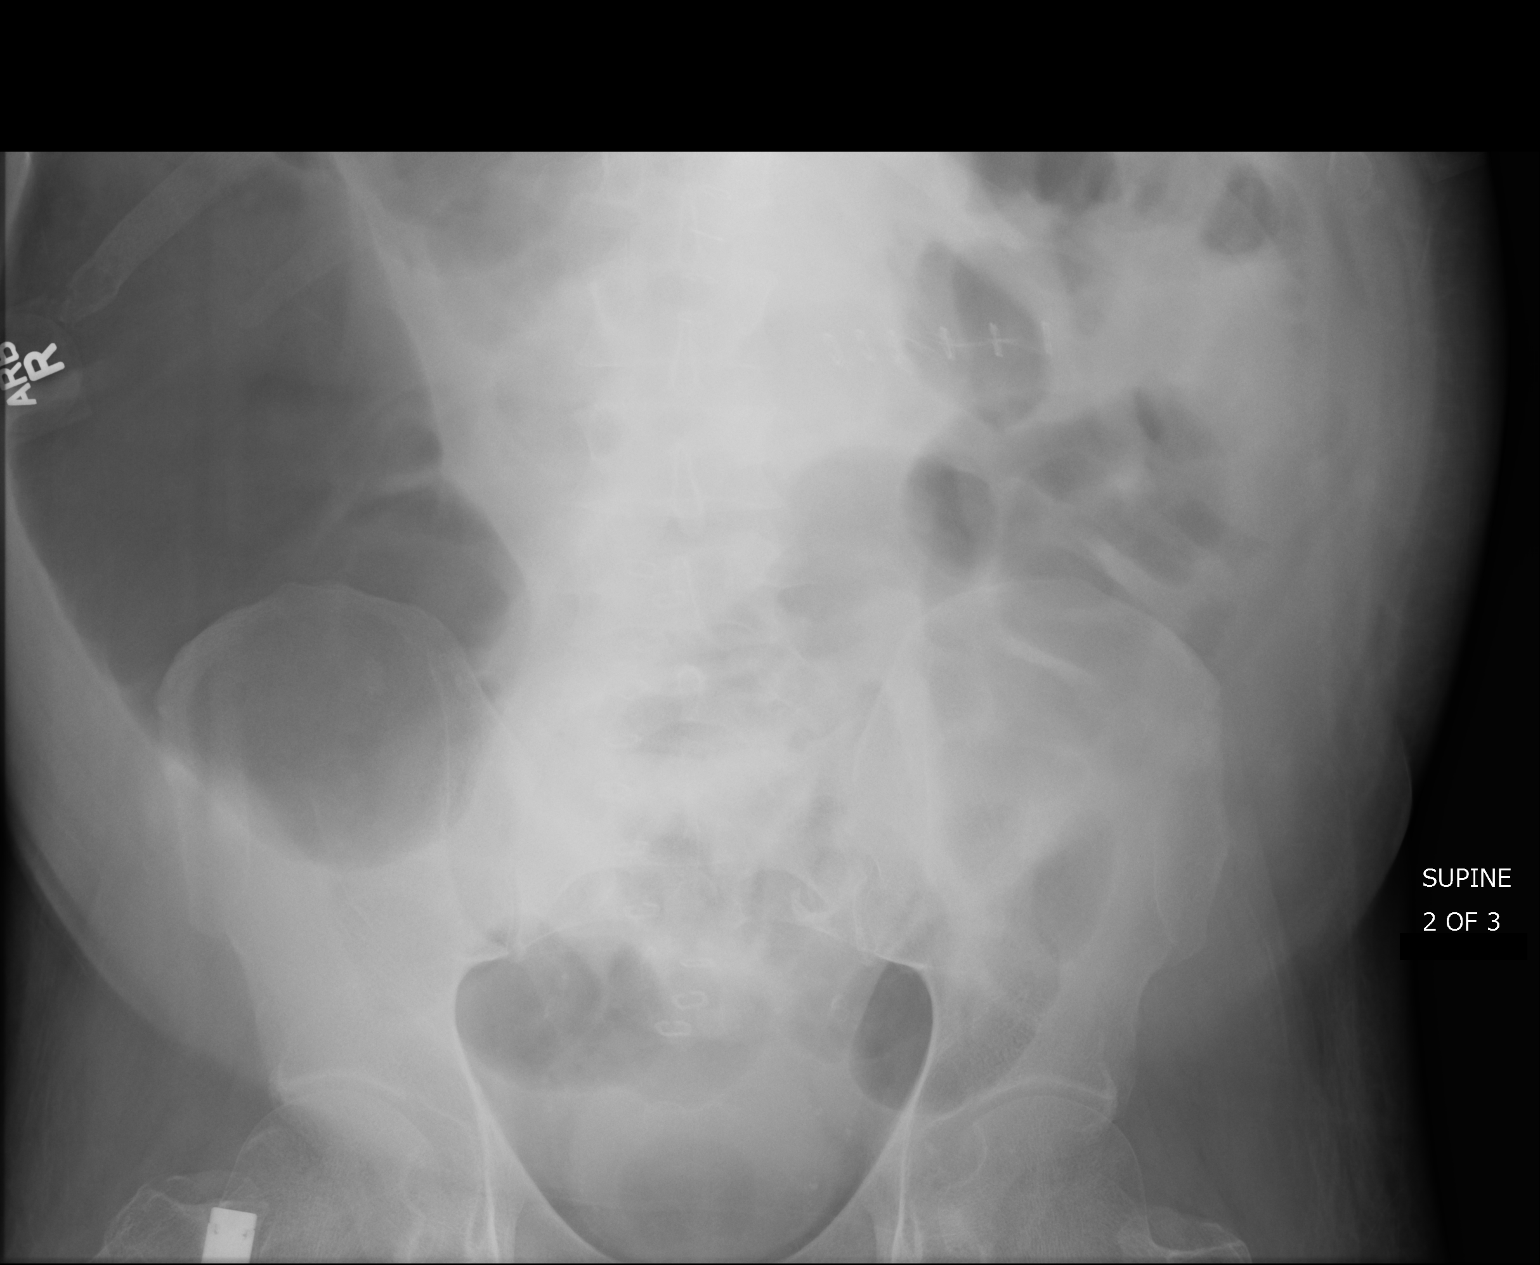
[im 2/3]
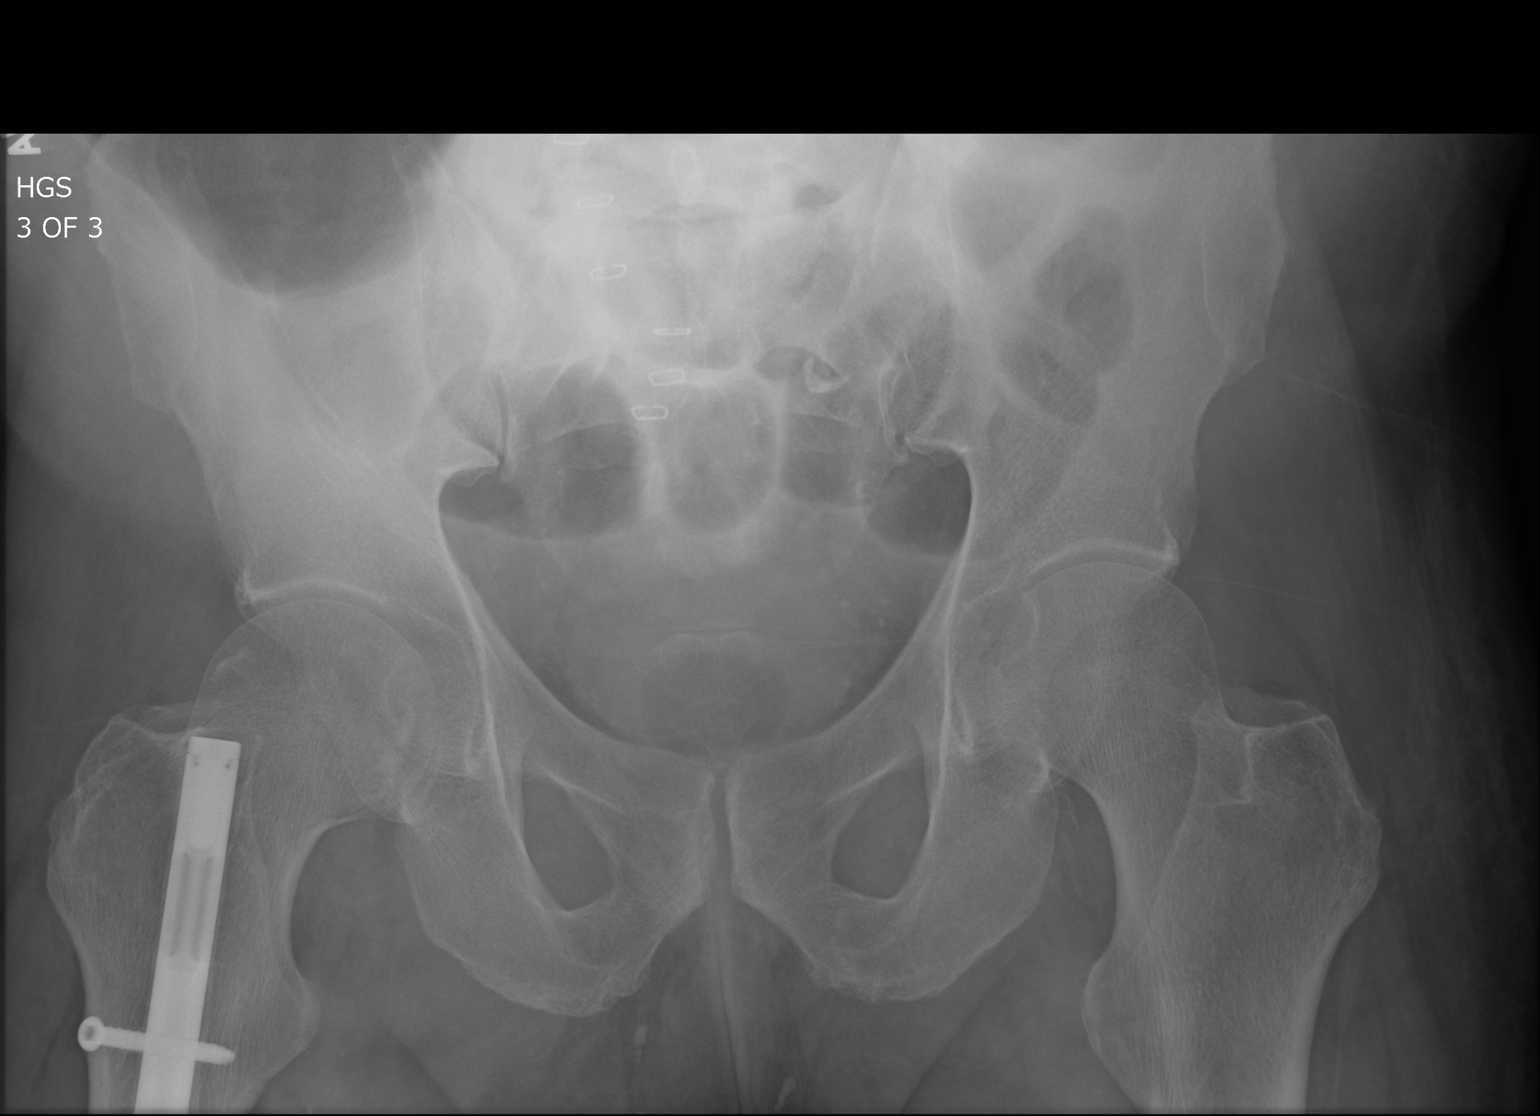
[im 3/3]
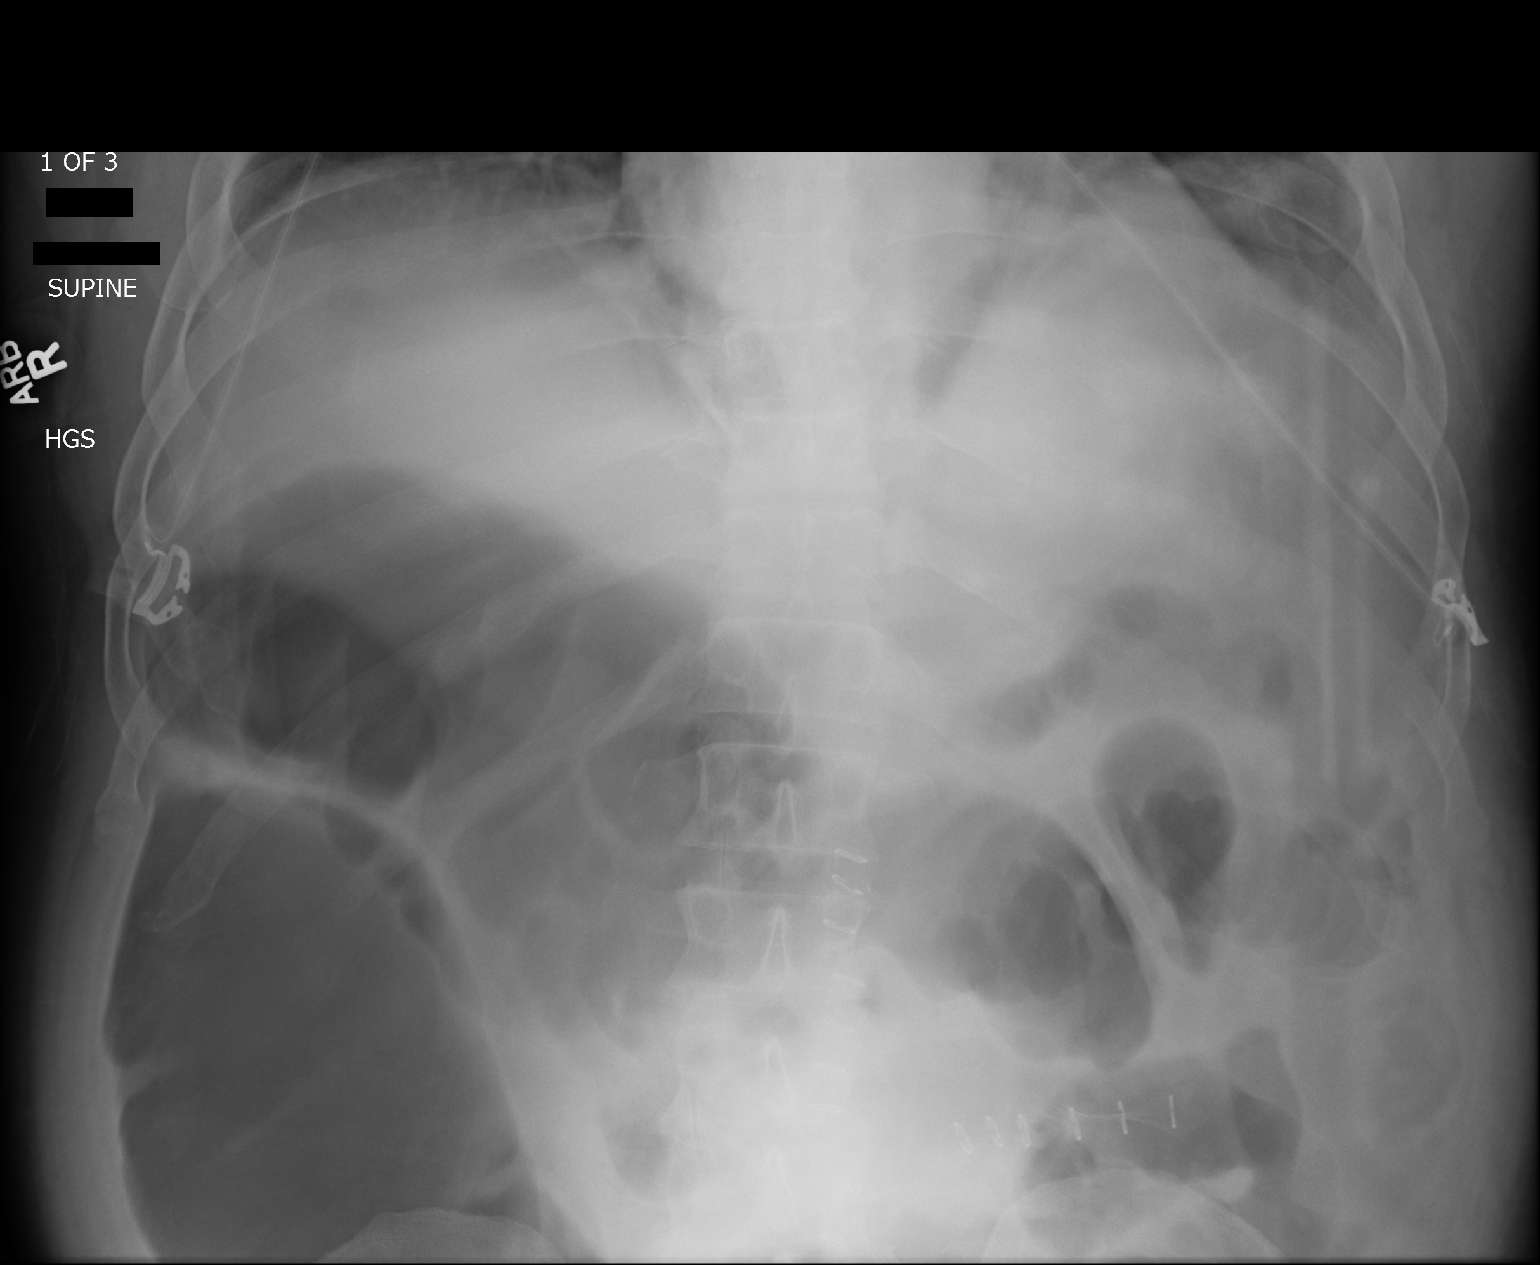

[3 of 3 positions shown; findings below may reference images not displayed]

FINDINGS: Persistent gaseous distended small bowel loops and proximal colon
consistent with postop ileus. Postsurgical changes with midline skin
staples.
IMPRESSION: Persistent gaseous distended small bowel loops and proximal colon
consistent with postop ileus.

## 2016-09-28 ENCOUNTER — Telehealth: Payer: Self-pay | Admitting: Oncology

## 2016-09-28 NOTE — Telephone Encounter (Signed)
MRI cancelled per [facility] [place], MRI Auth denied. Called patient to notify him that his MRI was cancelled due pending Authorization. Patient is aware.

## 2016-09-29 ENCOUNTER — Ambulatory Visit: Payer: Medicaid Other

## 2016-11-06 ENCOUNTER — Ambulatory Visit: Payer: Medicaid Other | Admitting: Oncology

## 2016-11-10 ENCOUNTER — Ambulatory Visit
Admission: RE | Admit: 2016-11-10 | Discharge: 2016-11-10 | Disposition: A | Discharge status: Home / Self Care | Payer: Medicare Other | Source: Ambulatory Visit | Attending: Oncology | Admitting: Oncology

## 2016-11-10 DIAGNOSIS — C772 Secondary and unspecified malignant neoplasm of intra-abdominal lymph nodes: Secondary | ICD-10-CM | POA: Insufficient documentation

## 2016-11-10 DIAGNOSIS — I700 Atherosclerosis of aorta: Secondary | ICD-10-CM | POA: Diagnosis not present

## 2016-11-10 DIAGNOSIS — N2889 Other specified disorders of kidney and ureter: Secondary | ICD-10-CM | POA: Insufficient documentation

## 2016-11-10 DIAGNOSIS — C186 Malignant neoplasm of descending colon: Secondary | ICD-10-CM | POA: Diagnosis present

## 2016-11-10 DIAGNOSIS — I714 Abdominal aortic aneurysm, without rupture: Secondary | ICD-10-CM | POA: Diagnosis not present

## 2016-11-10 LAB — POCT I-STAT CREATININE: Creatinine, Ser: 1.8 mg/dL — ABNORMAL HIGH (ref 0.61–1.24)

## 2016-11-10 MED ORDER — GADOBENATE DIMEGLUMINE 529 MG/ML IV SOLN
20.0000 mL | Freq: Once | INTRAVENOUS | Status: AC | PRN
  Administered 2016-11-10: 20 mL via INTRAVENOUS

## 2016-11-11 NOTE — Progress Notes (Signed)
[place]  [phone]) [phone] Fax:(336) [phone]  ID: James Keller OB: [birth year]  MR#: [id number]  TGG#:[id number]  Patient Care Team: Herminio Commons, MD as PCP - General (Family Medicine) Marlyce Huge, MD as Attending Physician (Surgery) Lucilla Lame, MD as Consulting Physician (Gastroenterology)  CHIEF COMPLAINT: Stage IIIB adenocarcinoma of the desending colon, right kidney mass.  INTERVAL HISTORY: Patient returns to clinic today for routine six-month evaluation and discussion of his imaging results. He continues to feel well and is asymptomatic. He has a mild peripheral neuropathy but it does not affect his day-to-day activity. He denies any fevers or illnesses. He has no other neurologic complaints. He has no chest pain or shortness of breath. He denies any weight loss. He denies any easy bleeding or bruising. He has no nausea, vomiting, diarrhea, or constipation. He denies any melena or hematochezia. Patient offers no specific complaints today.   REVIEW OF SYSTEMS:   Review of Systems  Constitutional: Negative.  Negative for fever, malaise/fatigue and weight loss.  Respiratory: Negative.  Negative for shortness of breath.   Cardiovascular: Negative.  Negative for chest pain and leg swelling.  Gastrointestinal: Negative.  Negative for abdominal pain, blood in stool and melena.  Genitourinary: Negative for frequency, hematuria and urgency.  Musculoskeletal: Negative.   Skin: Negative.   Neurological: Positive for sensory change. Negative for weakness.  Psychiatric/Behavioral: Negative.  The patient is not nervous/anxious.     As per HPI. Otherwise, a complete review of systems is negative.  PAST MEDICAL HISTORY: Past Medical History:  Diagnosis Date  . Colon cancer ([place])    a. 11/2013 s/p colon resection/Hartmann's procedure;  b. Chemo: FOLFOX completed 07/2014.  Marland Kitchen DVT (deep venous thrombosis) ([place])    RIGHT LEG/ HX OF  . DVT  (deep venous thrombosis) ([place])   . Dysrhythmia   . Hypertension   . Lower extremity edema    Rt leg  . 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 PAF (paroxysmal atrial fibrillation) ([place])   . S/P chemotherapy, time since less than 4 weeks   . Tobacco abuse     PAST SURGICAL HISTORY: Past Surgical History:  Procedure Laterality Date  . COLON RESECTION  12/19/2013   colostomy  . COLON SURGERY    . COLONOSCOPY WITH PROPOFOL N/A 09/14/2014   Procedure: COLONOSCOPY WITH PROPOFOL THROUGH COLOSTOMY AND COLONOSCOPY THRU RECTUM;  Surgeon: Lucilla Lame, MD;  Location: [facility];  Service: Endoscopy;  Laterality: N/A;  TRANSVERSE COLON POLYP AND SIGMOID COLON POLYP  . COLONOSCOPY WITH PROPOFOL N/A 11/29/2015   Procedure: COLONOSCOPY WITH PROPOFOL;  Surgeon: [name], MD;  Location: [facility];  Service: Endoscopy;  Laterality: N/A;  . COLOSTOMY REVERSAL N/A 10/28/2014   Procedure: COLOSTOMY REVERSAL;  Surgeon: Marlyce Huge, MD;  Location: [facility];  Service: General;  Laterality: N/A;  . FEMUR FRACTURE SURGERY Right   . LEG SURGERY    . LYSIS OF ADHESION  10/28/2014   Procedure: LYSIS OF ADHESION;  Surgeon: Marlyce Huge, MD;  Location: [facility];  Service: General;;  . PORT A CATH INJECTION ([place] HX) Right   . TUNNELED VENOUS PORT PLACEMENT      FAMILY HISTORY: Grandmother with "liver cancer".     ADVANCED DIRECTIVES:    HEALTH MAINTENANCE: Social History  Substance Use Topics  . Smoking status: Current Some Day Smoker    Packs/day: 0.50    Years: 54.00    Types: Cigarettes  . Smokeless  tobacco: Never Used  . Alcohol use 3.6 oz/week    6 Cans of beer per week     Comment: moderate beer     Colonoscopy:  PAP:  Bone density:  Lipid panel:  Allergies  Allergen Reactions  . No Known Allergies     Current Outpatient Prescriptions  Medication Sig Dispense Refill  . amiodarone (PACERONE) 200 MG tablet Take 200 mg by mouth  daily.     Marland Kitchen lisinopril (PRINIVIL,ZESTRIL) 20 MG tablet Take 1 tablet by mouth daily. Reported on 08/12/2015    . metoprolol tartrate (LOPRESSOR) 25 MG tablet Take 25 mg by mouth 2 (two) times daily. Am and pm    . warfarin (COUMADIN) 1 MG tablet Take 1 mg by mouth daily. Take on MWF with 5mg  tablet for total dose of 6mg  on MWF    . amLODipine (NORVASC) 10 MG tablet Take 1 tablet by mouth daily.    Marland Kitchen gabapentin (NEURONTIN) 300 MG capsule Take 1 capsule (300 mg total) by mouth 3 (three) times daily. (Patient not taking: Reported on 11/13/2016) 90 capsule 1   No current facility-administered medications for this visit.    Facility-Administered Medications Ordered in Other Visits  Medication Dose Route Frequency Provider Last Rate Last Dose  . sodium chloride 0.9 % injection 10 mL  10 mL Intracatheter PRN Lloyd Huger, MD   10 mL at 10/19/14 1353    OBJECTIVE: Vitals:   11/13/16 1122  BP: (!) 156/85  Pulse: 67  Resp: 18  Temp: (!) 96.7 F (35.9 C)     Body mass index is 31.23 kg/m.    ECOG FS:0 - Asymptomatic  General: Well-developed, well-nourished, no acute distress. Eyes: anicteric sclera. Lungs: Clear to auscultation bilaterally. Heart: Regular rate and rhythm. No rubs, murmurs, or gallops. Abdomen: Soft, nontender, nondistended. No organomegaly noted, normoactive bowel sounds. Musculoskeletal: No edema, cyanosis, or clubbing. Neuro: Alert, answering all questions appropriately. Cranial nerves grossly intact. Skin: No rashes or petechiae noted. Psych: Normal affect.    LAB RESULTS:  Lab Results  Component Value Date   NA 139 10/28/2015   K 4.3 10/28/2015   CL 105 10/28/2015   CO2 28 10/28/2015   GLUCOSE 98 10/28/2015   BUN 24 (H) 10/28/2015   CREATININE 1.80 (H) 11/10/2016   CALCIUM 9.2 10/28/2015   PROT 7.2 09/07/2015   ALBUMIN 4.2 09/07/2015   AST 20 09/07/2015   ALT 22 09/07/2015   ALKPHOS 51 09/07/2015   BILITOT 0.7 09/07/2015   GFRNONAA 40 (L)  10/28/2015   GFRAA 47 (L) 10/28/2015    Lab Results  Component Value Date   WBC 4.0 05/01/2016   NEUTROABS 2.4 05/01/2016   HGB 13.6 05/01/2016   HCT 39.3 (L) 05/01/2016   MCV 86.7 05/01/2016   PLT 190 05/01/2016   Lab Results  Component Value Date   CEA 6.3 (H) 05/01/2016      STUDIES:    Mr Abdomen W Wo Contrast  Result Date: 11/10/2016 CLINICAL DATA:  66 year old male with history of renal lesion in the right kidney concerning for cystic renal neoplasm. Additional history of colon cancer with metastatic disease. Follow-up study. EXAM: MRI ABDOMEN WITHOUT AND WITH CONTRAST TECHNIQUE: Multiplanar multisequence MR imaging of the abdomen was performed both before and after the administration of intravenous contrast. CONTRAST:  13mL MULTIHANCE GADOBENATE DIMEGLUMINE 529 MG/ML IV SOLN COMPARISON:  MRI of the abdomen 05/01/2016. FINDINGS: Lower chest: Unremarkable. Hepatobiliary: No suspicious cystic or solid hepatic lesions. No intra or  extrahepatic biliary ductal dilatation. Gallbladder is normal in appearance. Pancreas: No pancreatic mass. No pancreatic duct with for her lumen or inflammatory changes. Spleen:  Unremarkable. Adrenals/Urinary Tract: The previously noted cystic lesion in the lower pole the right kidney is stable in size measuring 19 x 24 x 28 mm on today's exam (axial image 16 of series 16 and coronal image 10 of series 2). This lesion is again predominantly T1 hypointense and T2 hyperintense, however, there are some thin internal septations and there is a mural nodule in the posterior and superior aspect of the lesion which is slightly T1 hyperintense and T2 hypointense. Post gadolinium images demonstrate potential mild enhancement of the mural nodule (although assessment is compromised by considerable variation in respiration between postcontrast and precontrast imaging limiting subtraction views). This lesion remains completely encapsulated within Gerota's fascia and is well  separate from the right renal vein which remains widely patent at this time. Several other tiny subcentimeter T1 hypointense, T2 hyperintense, nonenhancing lesions are noted in both kidneys, compatible with multiple simple cysts. No hydroureteronephrosis in the visualized portions of the abdomen. Bilateral adrenal glands are normal in appearance. Stomach/Bowel: Visualized portions are unremarkable. Vascular/Lymphatic: Aortic atherosclerosis with fusiform infrarenal abdominal aortic aneurysm measuring 3.4 x 3.7 cm. No lymphadenopathy noted in the abdomen. Other:  No significant volume of ascites in the visualized abdomen. Musculoskeletal: No aggressive appearing osseous lesions are noted in the visualized portions of the skeleton. IMPRESSION: 1. Although the cystic lesion in the lower pole of the right kidney does appear to have a small potentially enhancing mural nodule, this lesion has shown very limited growth over serial prior examinations, suggesting that if it is in fact a neoplasm it is low-grade. This remains encapsulated within Gerota's fascia and is separate from the right renal vein which remains widely patent. If surgical resection or ablation of this lesion is not appropriate clinically, continued imaging surveillance is recommended at 6-12 month intervals to ensure the continued stability of this lesion. 2. No findings to suggest metastatic disease from colon cancer on today's examination. 3. Aortic atherosclerosis with fusiform infrarenal abdominal aortic aneurysm measuring 3.4 x 3.7 cm. Recommend followup by ultrasound in 2 years. This recommendation follows ACR consensus guidelines: White Paper of the ACR Incidental Findings Committee II on Vascular Findings. J Am Coll Radiol 2013; 10:789-794. Aortic Atherosclerosis (ICD10-I70.0). Electronically Signed   By: Vinnie Langton M.D.   On: 11/10/2016 09:53    ASSESSMENT: Stage IIIB adenocarcinoma of the desending colon, DVT, right kidney  mass.  PLAN:    1. Stage IIIB adenocarcinoma of the desending colon: Patient completed 12 cycles of FOLFOX on July 29, 2014. CEA is mildly elevated at 6.3, but essentially unchanged. Today's result is pending. MRI results from November 10, 2016 reviewed independently and reported as above with no evidence of recurrent or metastatic disease. Patient had a colonoscopy on November 29, 2015 that was reported as normal. Return to clinic in 6 months for laboratory work and further evaluation.  2. H/o DVT: Continue Coumadin. Patient's INR is monitored by primary care.  3. Elevated creatinine: Patient's creatinine is approximately his baseline, monitor.  4. Neuropathy: Patient is no longer taking gabapentin. 5. Right kidney mass: MRI results from November 10, 2016 reviewed independently and reported as above. Lesion is unchanged, but remains suspicious for renal cell carcinoma. No intervention is needed at this time, repeat MRI in 12 months. Will refer back to urology if there are any questions or concerns.  Patient expressed understanding  and was in agreement with this plan. He also understands that He can call clinic at any time with any questions, concerns, or complaints.   Colon cancer   Staging form: Colon/Rectum, AJCC 7th Edition     Pathologic stage from 05/04/2014: Stage IIIB (T4, N1, M0) - Signed by Lloyd Huger, MD on 05/04/2014   Lloyd Huger, MD   11/13/2016 11:45 AM

## 2016-11-13 ENCOUNTER — Telehealth: Payer: Self-pay | Admitting: Oncology

## 2016-11-13 ENCOUNTER — Inpatient Hospital Stay: Payer: Medicare Other

## 2016-11-13 ENCOUNTER — Inpatient Hospital Stay: Payer: Medicare Other | Attending: Oncology | Admitting: Oncology

## 2016-11-13 VITALS — BP 156/85 | HR 67 | Temp 96.7°F | Resp 18 | Wt 236.7 lb

## 2016-11-13 DIAGNOSIS — F17210 Nicotine dependence, cigarettes, uncomplicated: Secondary | ICD-10-CM | POA: Diagnosis not present

## 2016-11-13 DIAGNOSIS — R944 Abnormal results of kidney function studies: Secondary | ICD-10-CM | POA: Diagnosis not present

## 2016-11-13 DIAGNOSIS — I499 Cardiac arrhythmia, unspecified: Secondary | ICD-10-CM | POA: Diagnosis not present

## 2016-11-13 DIAGNOSIS — G709 Myoneural disorder, unspecified: Secondary | ICD-10-CM | POA: Insufficient documentation

## 2016-11-13 DIAGNOSIS — Z7901 Long term (current) use of anticoagulants: Secondary | ICD-10-CM | POA: Diagnosis not present

## 2016-11-13 DIAGNOSIS — Z79899 Other long term (current) drug therapy: Secondary | ICD-10-CM | POA: Diagnosis not present

## 2016-11-13 DIAGNOSIS — Z86718 Personal history of other venous thrombosis and embolism: Secondary | ICD-10-CM

## 2016-11-13 DIAGNOSIS — I480 Paroxysmal atrial fibrillation: Secondary | ICD-10-CM | POA: Diagnosis not present

## 2016-11-13 DIAGNOSIS — G629 Polyneuropathy, unspecified: Secondary | ICD-10-CM | POA: Insufficient documentation

## 2016-11-13 DIAGNOSIS — I10 Essential (primary) hypertension: Secondary | ICD-10-CM | POA: Insufficient documentation

## 2016-11-13 DIAGNOSIS — C186 Malignant neoplasm of descending colon: Secondary | ICD-10-CM

## 2016-11-13 DIAGNOSIS — Z9221 Personal history of antineoplastic chemotherapy: Secondary | ICD-10-CM | POA: Diagnosis not present

## 2016-11-13 DIAGNOSIS — I700 Atherosclerosis of aorta: Secondary | ICD-10-CM | POA: Diagnosis not present

## 2016-11-13 DIAGNOSIS — N2889 Other specified disorders of kidney and ureter: Secondary | ICD-10-CM | POA: Diagnosis not present

## 2016-11-13 DIAGNOSIS — I714 Abdominal aortic aneurysm, without rupture: Secondary | ICD-10-CM | POA: Diagnosis not present

## 2016-11-13 DIAGNOSIS — Z800 Family history of malignant neoplasm of digestive organs: Secondary | ICD-10-CM | POA: Insufficient documentation

## 2016-11-13 DIAGNOSIS — C772 Secondary and unspecified malignant neoplasm of intra-abdominal lymph nodes: Secondary | ICD-10-CM

## 2016-11-13 DIAGNOSIS — R609 Edema, unspecified: Secondary | ICD-10-CM | POA: Diagnosis not present

## 2016-11-13 DIAGNOSIS — Z95828 Presence of other vascular implants and grafts: Secondary | ICD-10-CM

## 2016-11-13 LAB — CBC WITH DIFFERENTIAL/PLATELET
BASOS PCT: 1 %
Basophils Absolute: 0 10*3/uL (ref 0–0.1)
EOS ABS: 0.2 10*3/uL (ref 0–0.7)
EOS PCT: 6 %
HCT: 42 % (ref 40.0–52.0)
HEMOGLOBIN: 14.1 g/dL (ref 13.0–18.0)
LYMPHS ABS: 1 10*3/uL (ref 1.0–3.6)
Lymphocytes Relative: 34 %
MCH: 29.5 pg (ref 26.0–34.0)
MCHC: 33.5 g/dL (ref 32.0–36.0)
MCV: 87.8 fL (ref 80.0–100.0)
MONOS PCT: 10 %
Monocytes Absolute: 0.3 10*3/uL (ref 0.2–1.0)
NEUTROS PCT: 49 %
Neutro Abs: 1.5 10*3/uL (ref 1.4–6.5)
PLATELETS: 179 10*3/uL (ref 150–440)
RBC: 4.79 MIL/uL (ref 4.40–5.90)
RDW: 15.7 % — ABNORMAL HIGH (ref 11.5–14.5)
WBC: 2.9 10*3/uL — ABNORMAL LOW (ref 3.8–10.6)

## 2016-11-13 MED ORDER — HEPARIN SOD (PORK) LOCK FLUSH 100 UNIT/ML IV SOLN
500.0000 [IU] | INTRAVENOUS | Status: AC | PRN
  Administered 2016-11-13: 500 [IU]

## 2016-11-13 MED ORDER — SODIUM CHLORIDE 0.9% FLUSH
10.0000 mL | INTRAVENOUS | Status: AC | PRN
  Administered 2016-11-13: 10 mL
  Filled 2016-11-13: qty 10

## 2016-11-13 NOTE — Telephone Encounter (Signed)
Port Flush with labs scheduled every 6 to 8 weeks, per pt has port and not in LOS Advised Kendra/Nurse.

## 2016-11-13 NOTE — Progress Notes (Signed)
Patient denies any concerns today.  

## 2016-11-14 LAB — CEA: CEA: 7.8 ng/mL — ABNORMAL HIGH (ref 0.0–4.7)

## 2016-12-25 ENCOUNTER — Inpatient Hospital Stay: Payer: Medicare Other | Attending: Oncology

## 2017-01-07 IMAGING — US US EXTREM LOW VENOUS*R*
1 series · 13 of 24 positions shown · non-contrast
Comparison: January 07, 2014.

CLINICAL DATA: Acute right lower extremity pain.



[Series 1: us extrem low venous*right* · 0.08mm/px · 13 of 32 slices shown]
[im 1/32]
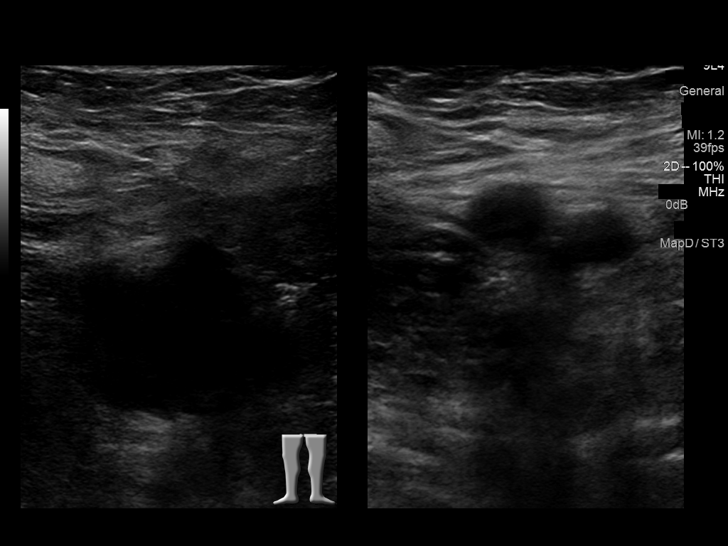
[im 3/32]
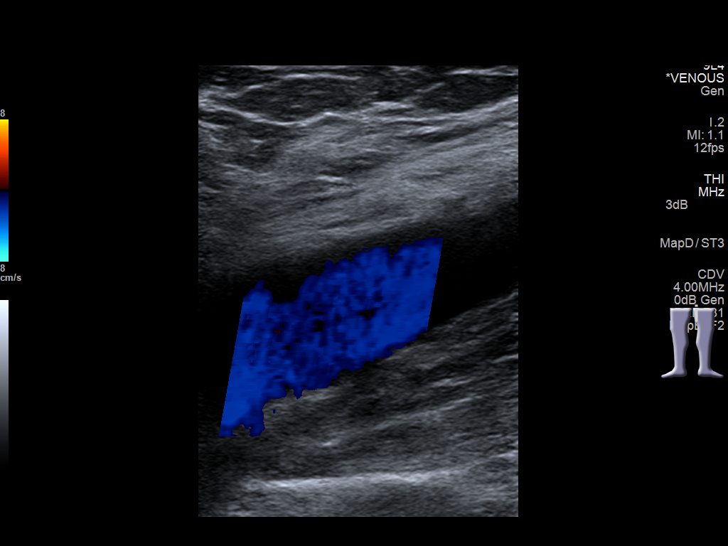
[im 6/32]
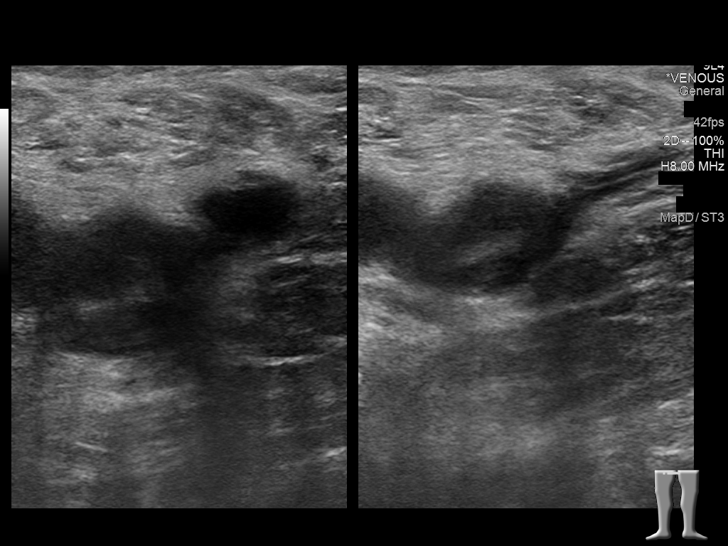
[im 9/32]
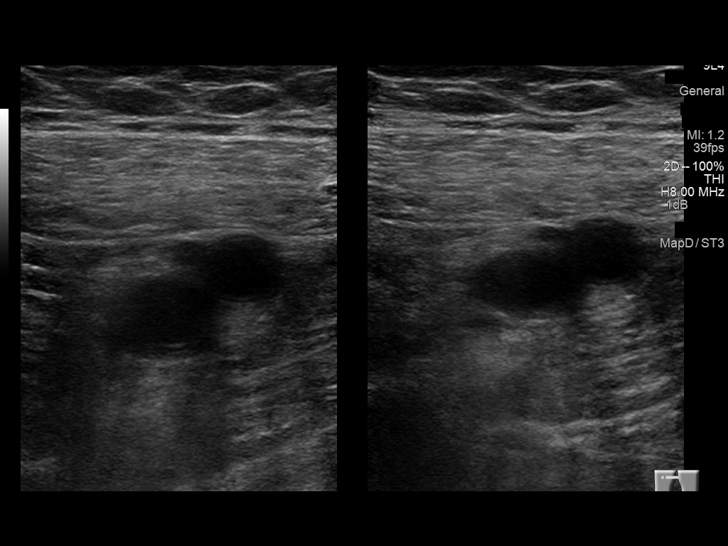
[im 11/32]
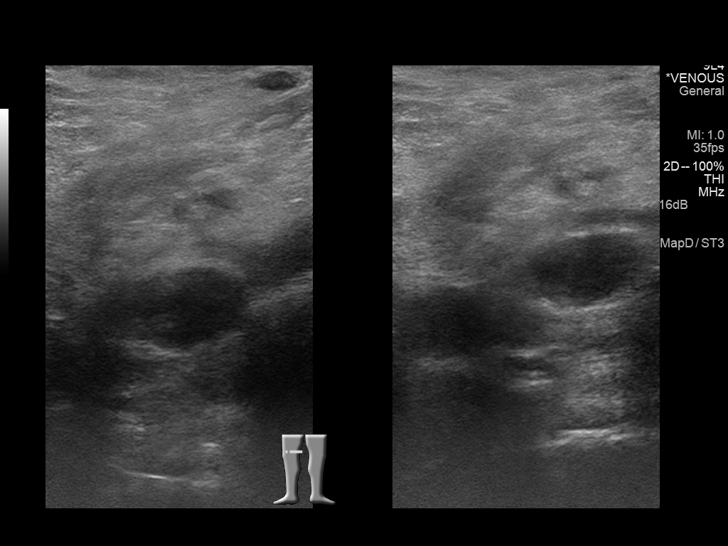
[im 14/32]
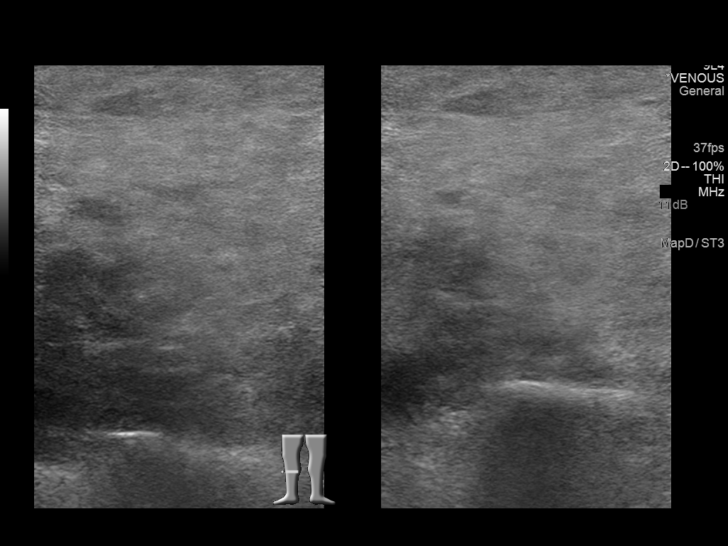
[im 17/32]
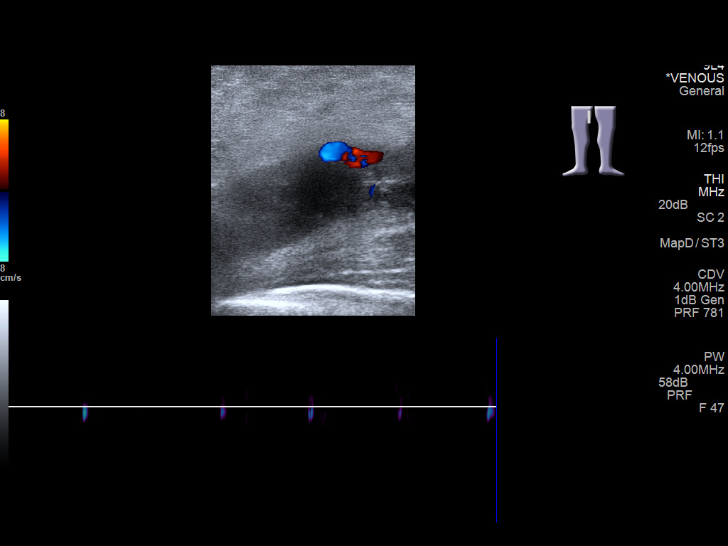
[im 18/32]
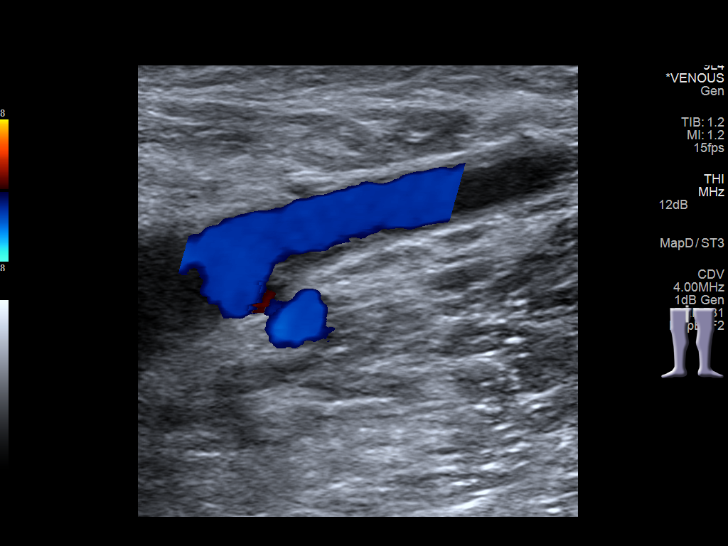
[im 21/32]
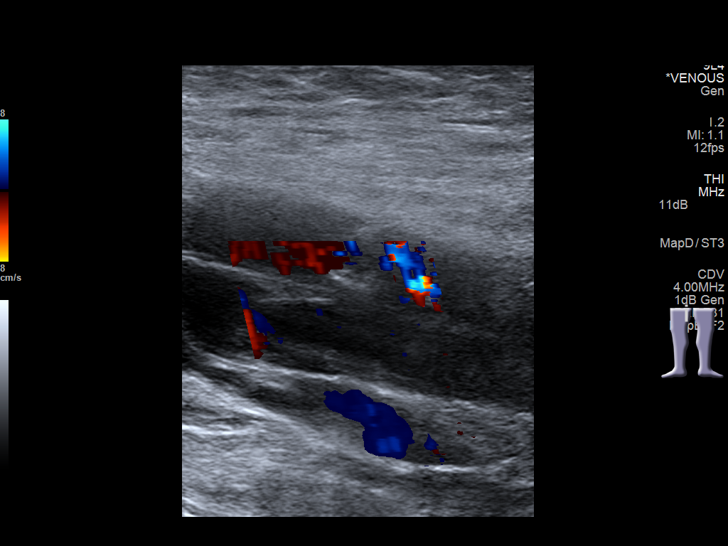
[im 23/32]
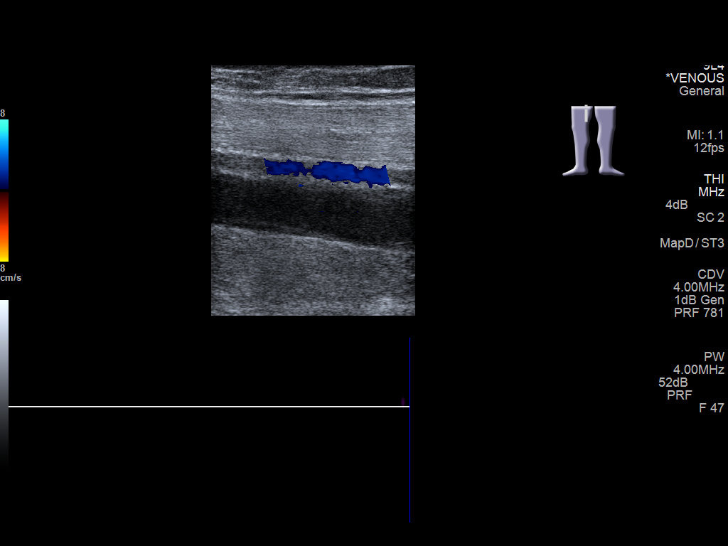
[im 26/32]
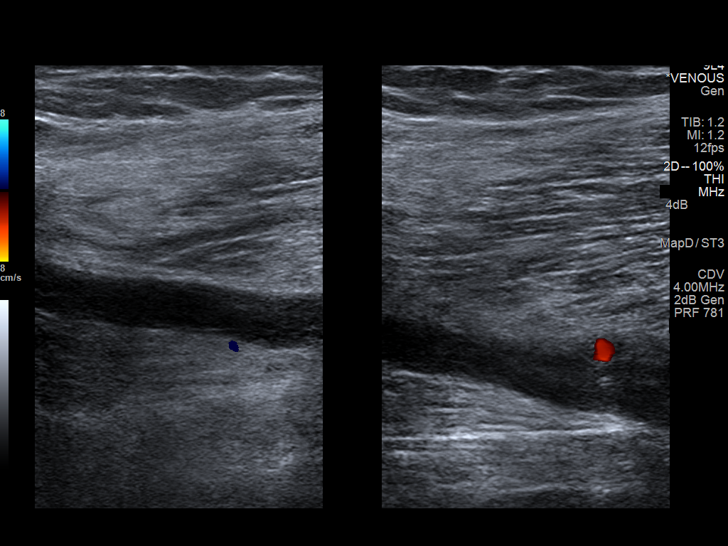
[im 29/32]
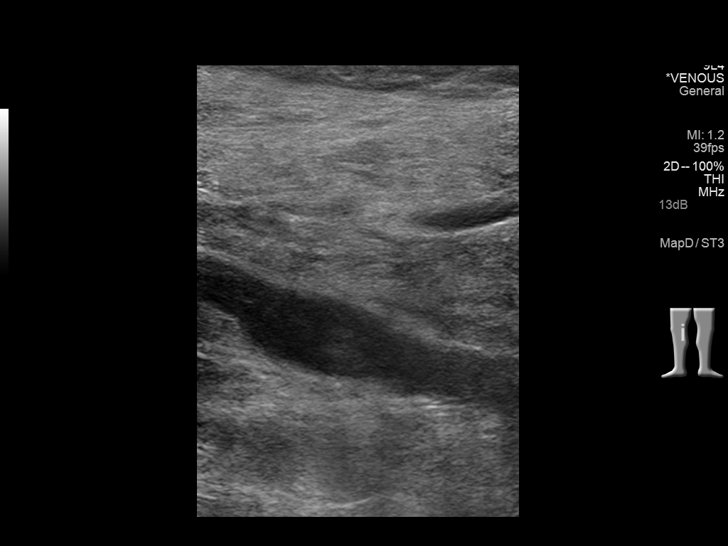
[im 32/32]
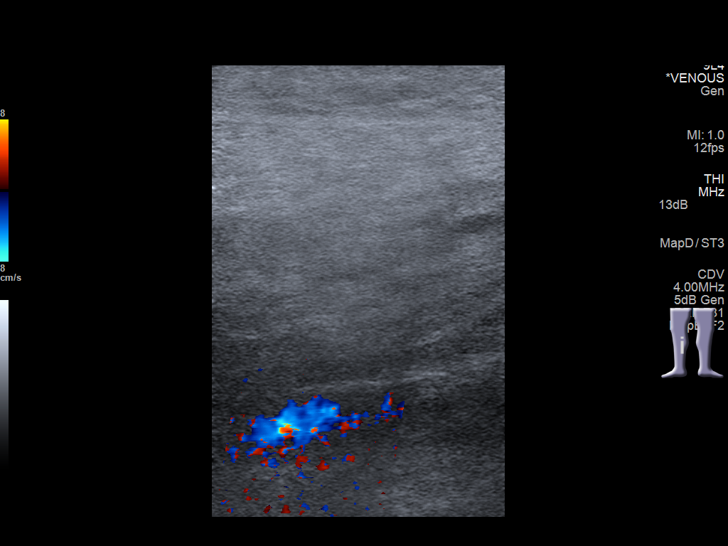

[13 of 24 positions shown; findings below may reference images not displayed]

FINDINGS: Contralateral Common Femoral Vein: Normal compressibility and flow
without evidence of thrombus.

Common Femoral Vein: Noncompressible without flow consistent with
occlusive thrombus.

Saphenofemoral Junction: Nonocclusive thrombus is noted.

Profunda Femoral Vein: Nonocclusive thrombus is noted.

Femoral Vein: Noncompressible without flow consistent with occlusive
thrombus.

Popliteal Vein: Incompressible without flow consistent with
occlusive thrombus.

Calf Veins: Incompressible without flow consistent with occlusive
thrombus.

Superficial Great Saphenous Vein: No evidence of thrombus. Normal
compressibility and flow on color Doppler imaging.

Venous Reflux:  None.

Other Findings:  None.
IMPRESSION: Deep venous venous thrombosis is seen in the right lower extremity
extending from right common femoral vein to right posterior tibial
vein. Occlusive thrombus is now seen in the femoral, popliteal and
posterior tibial vein which has progressed since prior exam.
Critical Value/emergent results were called by telephone at the time
of interpretation on 11/12/2014 at [DATE] to Dr. DENY DENISSA SENURI
, who verbally acknowledged these results.

## 2017-02-05 ENCOUNTER — Other Ambulatory Visit: Payer: Self-pay | Admitting: *Deleted

## 2017-02-05 ENCOUNTER — Inpatient Hospital Stay: Payer: Medicare Other | Attending: Oncology

## 2017-02-05 DIAGNOSIS — Z95828 Presence of other vascular implants and grafts: Secondary | ICD-10-CM

## 2017-02-05 MED ORDER — SODIUM CHLORIDE 0.9% FLUSH
10.0000 mL | INTRAVENOUS | Status: AC | PRN
  Filled 2017-02-05: qty 10

## 2017-02-05 MED ORDER — HEPARIN SOD (PORK) LOCK FLUSH 100 UNIT/ML IV SOLN: 500.0000 [IU] | INTRAVENOUS | Status: AC | PRN

## 2017-03-06 DIAGNOSIS — Z7901 Long term (current) use of anticoagulants: Secondary | ICD-10-CM | POA: Diagnosis not present

## 2017-03-06 DIAGNOSIS — I480 Paroxysmal atrial fibrillation: Secondary | ICD-10-CM | POA: Diagnosis not present

## 2017-03-06 DIAGNOSIS — I82401 Acute embolism and thrombosis of unspecified deep veins of right lower extremity: Secondary | ICD-10-CM | POA: Diagnosis not present

## 2017-03-19 ENCOUNTER — Inpatient Hospital Stay: Payer: Medicare HMO | Attending: Oncology

## 2017-05-13 NOTE — Progress Notes (Signed)
[place]  Telephone:(336) [phone] Fax:(336) [phone]  ID: James Keller OB: Mar 11, 1950  MR#: [id number]  UVO#:[id number]  Patient Care Team: Herminio Commons, MD as PCP - General (Family Medicine) Marlyce Huge, MD as Attending Physician (Surgery) Lucilla Lame, MD as Consulting Physician (Gastroenterology)  CHIEF COMPLAINT: Stage IIIB adenocarcinoma of the desending colon, right kidney mass.  INTERVAL HISTORY: Patient returns to clinic today for repeat laboratory work and routine six-month evaluation.  He continues to feel well and remains asymptomatic.  He does not complain of a peripheral neuropathy today.  He has no neurologic complaints. He denies any fevers or illnesses. He has no chest pain or shortness of breath. He denies any weight loss. He has no nausea, vomiting, diarrhea, or constipation.  He denies any changes in his bowel movements.  He denies any melena or hematochezia. Patient offers no specific complaints today.   REVIEW OF SYSTEMS:   Review of Systems  Constitutional: Negative.  Negative for fever, malaise/fatigue and weight loss.  Respiratory: Negative.  Negative for shortness of breath.   Cardiovascular: Negative.  Negative for chest pain and leg swelling.  Gastrointestinal: Negative.  Negative for abdominal pain, blood in stool and melena.  Genitourinary: Negative.  Negative for frequency, hematuria and urgency.  Musculoskeletal: Negative.   Skin: Negative.   Neurological: Negative.  Negative for sensory change, focal weakness and weakness.  Psychiatric/Behavioral: Negative.  The patient is not nervous/anxious.     As per HPI. Otherwise, a complete review of systems is negative.  PAST MEDICAL HISTORY: Past Medical History:  Diagnosis Date  . Colon cancer ([place])    a. 11/2013 s/p colon resection/Hartmann's procedure;  b. Chemo: FOLFOX completed 07/2014.  Marland Kitchen DVT (deep venous thrombosis) ([place])    RIGHT LEG/ HX OF  . DVT (deep  venous thrombosis) ([place])   . Dysrhythmia   . Hypertension   . Lower extremity edema    Rt leg  . 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 PAF (paroxysmal atrial fibrillation) ([place])   . S/P chemotherapy, time since less than 4 weeks   . Tobacco abuse     PAST SURGICAL HISTORY: Past Surgical History:  Procedure Laterality Date  . COLON RESECTION  12/19/2013   colostomy  . COLON SURGERY    . COLONOSCOPY WITH PROPOFOL N/A 09/14/2014   Procedure: COLONOSCOPY WITH PROPOFOL THROUGH COLOSTOMY AND COLONOSCOPY THRU RECTUM;  Surgeon: Lucilla Lame, MD;  Location: [facility];  Service: Endoscopy;  Laterality: N/A;  TRANSVERSE COLON POLYP AND SIGMOID COLON POLYP  . COLONOSCOPY WITH PROPOFOL N/A 11/29/2015   Procedure: COLONOSCOPY WITH PROPOFOL;  Surgeon: [name], MD;  Location: [facility];  Service: Endoscopy;  Laterality: N/A;  . COLOSTOMY REVERSAL N/A 10/28/2014   Procedure: COLOSTOMY REVERSAL;  Surgeon: Marlyce Huge, MD;  Location: [facility];  Service: General;  Laterality: N/A;  . FEMUR FRACTURE SURGERY Right   . LEG SURGERY    . LYSIS OF ADHESION  10/28/2014   Procedure: LYSIS OF ADHESION;  Surgeon: Marlyce Huge, MD;  Location: [facility];  Service: General;;  . PORT A CATH INJECTION ([place] HX) Right   . TUNNELED VENOUS PORT PLACEMENT      FAMILY HISTORY: Grandmother with "liver cancer".     ADVANCED DIRECTIVES:    HEALTH MAINTENANCE: Social History   Tobacco Use  . Smoking status: Current Some Day Smoker    Packs/day: 0.50    Years: 54.00    Pack years:  27.00    Types: Cigarettes  . Smokeless tobacco: Never Used  Substance Use Topics  . Alcohol use: Yes    Alcohol/week: 3.6 oz    Types: 6 Cans of beer per week    Comment: moderate beer  . Drug use: Yes    Types: Marijuana    Comment: occasional     Colonoscopy:  PAP:  Bone density:  Lipid panel:  Allergies  Allergen Reactions  . No Known Allergies     Current  Outpatient Medications  Medication Sig Dispense Refill  . amiodarone (PACERONE) 200 MG tablet Take 200 mg by mouth daily.     Marland Kitchen gabapentin (NEURONTIN) 300 MG capsule Take 1 capsule (300 mg total) by mouth 3 (three) times daily. 90 capsule 1  . lisinopril (PRINIVIL,ZESTRIL) 20 MG tablet Take 1 tablet by mouth daily. Reported on 08/12/2015    . metoprolol tartrate (LOPRESSOR) 25 MG tablet Take 25 mg by mouth 2 (two) times daily. Am and pm    . rivaroxaban (XARELTO) 20 MG TABS tablet Take 20 mg by mouth daily with supper.    Marland Kitchen amLODipine (NORVASC) 10 MG tablet Take 1 tablet by mouth daily.     No current facility-administered medications for this visit.    Facility-Administered Medications Ordered in Other Visits  Medication Dose Route Frequency Provider Last Rate Last Dose  . heparin lock flush 100 unit/mL  500 Units Intracatheter PRN Lloyd Huger, MD      . sodium chloride 0.9 % injection 10 mL  10 mL Intracatheter PRN Lloyd Huger, MD   10 mL at 10/19/14 1353  . sodium chloride flush (NS) 0.9 % injection 10 mL  10 mL Intracatheter PRN Lloyd Huger, MD        OBJECTIVE: Vitals:   05/14/17 1400  BP: (!) 156/81  Pulse: (!) 52  Resp: 18  Temp: 97.6 F (36.4 C)     Body mass index is 30.91 kg/m.    ECOG FS:0 - Asymptomatic  General: Well-developed, well-nourished, no acute distress. Eyes: Pink conjunctiva, anicteric sclera. Lungs: Clear to auscultation bilaterally. Heart: Regular rate and rhythm. No rubs, murmurs, or gallops. Abdomen: Soft, nontender, nondistended. No organomegaly noted, normoactive bowel sounds. Musculoskeletal: No edema, cyanosis, or clubbing. Neuro: Alert, answering all questions appropriately. Cranial nerves grossly intact. Skin: No rashes or petechiae noted. Psych: Normal affect.  LAB RESULTS:  Lab Results  Component Value Date   NA 139 05/14/2017   K 4.2 05/14/2017   CL 106 05/14/2017   CO2 24 05/14/2017   GLUCOSE 114 (H) 05/14/2017    BUN 28 (H) 05/14/2017   CREATININE 2.01 (H) 05/14/2017   CALCIUM 9.1 05/14/2017   PROT 6.4 (L) 05/14/2017   ALBUMIN 3.7 05/14/2017   AST 20 05/14/2017   ALT 21 05/14/2017   ALKPHOS 55 05/14/2017   BILITOT 1.0 05/14/2017   GFRNONAA 33 (L) 05/14/2017   GFRAA 38 (L) 05/14/2017    Lab Results  Component Value Date   WBC 3.9 05/14/2017   NEUTROABS 2.4 05/14/2017   HGB 14.0 05/14/2017   HCT 41.5 05/14/2017   MCV 89.5 05/14/2017   PLT 163 05/14/2017    STUDIES:    No results found.  ASSESSMENT: Stage IIIB adenocarcinoma of the desending colon, DVT, right kidney mass.  PLAN:    1. Stage IIIB adenocarcinoma of the desending colon: Patient completed 12 cycles of FOLFOX on July 29, 2014.  His CEA is within normal limits today at 4.6.  MRI results from November 10, 2016 reviewed independently with no evidence of recurrent or metastatic disease. Patient had a colonoscopy on November 29, 2015 that was reported as normal. Return to clinic in 6 months for laboratory work and further evaluation.  2. H/o DVT: Patient has discontinued Coumadin and is now on Xarelto. 3. Elevated creatinine: Patient's creatinine has trended up and is now greater than 2.0.  A referral has been made to nephrology. 4. Neuropathy: Patient does not complain of this today.   5. Right kidney mass: MRI results from November 10, 2016 reviewed independently with no appreciable change.  Still remains highly suspicious for renal cell carcinoma, but no intervention is needed at this time.  We will repeat MRI of the abdomen in 6 months to assess for interval change. Will refer back to urology if there are any questions or concerns.  Patient expressed understanding and was in agreement with this plan. He also understands that He can call clinic at any time with any questions, concerns, or complaints.   Colon cancer   Staging form: Colon/Rectum, AJCC 7th Edition     Pathologic stage from 05/04/2014: Stage IIIB (T4, N1, M0) -  Signed by Lloyd Huger, MD on 05/04/2014   Lloyd Huger, MD   05/18/2017 8:43 AM

## 2017-05-14 ENCOUNTER — Other Ambulatory Visit: Payer: Self-pay | Admitting: Oncology

## 2017-05-14 ENCOUNTER — Inpatient Hospital Stay: Payer: Medicare HMO | Attending: Oncology | Admitting: Oncology

## 2017-05-14 ENCOUNTER — Inpatient Hospital Stay: Payer: Medicare HMO

## 2017-05-14 ENCOUNTER — Encounter: Payer: Self-pay | Admitting: Oncology

## 2017-05-14 VITALS — BP 156/81 | HR 52 | Temp 97.6°F | Resp 18 | Wt 234.3 lb

## 2017-05-14 DIAGNOSIS — C186 Malignant neoplasm of descending colon: Secondary | ICD-10-CM | POA: Insufficient documentation

## 2017-05-14 DIAGNOSIS — C20 Malignant neoplasm of rectum: Secondary | ICD-10-CM

## 2017-05-14 DIAGNOSIS — Z86718 Personal history of other venous thrombosis and embolism: Secondary | ICD-10-CM | POA: Insufficient documentation

## 2017-05-14 DIAGNOSIS — C772 Secondary and unspecified malignant neoplasm of intra-abdominal lymph nodes: Secondary | ICD-10-CM

## 2017-05-14 DIAGNOSIS — Z7901 Long term (current) use of anticoagulants: Secondary | ICD-10-CM | POA: Insufficient documentation

## 2017-05-14 DIAGNOSIS — Z720 Tobacco use: Secondary | ICD-10-CM | POA: Diagnosis not present

## 2017-05-14 DIAGNOSIS — R7989 Other specified abnormal findings of blood chemistry: Secondary | ICD-10-CM

## 2017-05-14 DIAGNOSIS — D490 Neoplasm of unspecified behavior of digestive system: Secondary | ICD-10-CM

## 2017-05-14 DIAGNOSIS — N2889 Other specified disorders of kidney and ureter: Secondary | ICD-10-CM

## 2017-05-14 DIAGNOSIS — Z95828 Presence of other vascular implants and grafts: Secondary | ICD-10-CM

## 2017-05-14 LAB — CBC WITH DIFFERENTIAL/PLATELET
BASOS ABS: 0 10*3/uL (ref 0–0.1)
Basophils Relative: 1 %
EOS PCT: 5 %
Eosinophils Absolute: 0.2 10*3/uL (ref 0–0.7)
HCT: 41.5 % (ref 40.0–52.0)
HEMOGLOBIN: 14 g/dL (ref 13.0–18.0)
LYMPHS ABS: 1 10*3/uL (ref 1.0–3.6)
Lymphocytes Relative: 25 %
MCH: 30.3 pg (ref 26.0–34.0)
MCHC: 33.8 g/dL (ref 32.0–36.0)
MCV: 89.5 fL (ref 80.0–100.0)
Monocytes Absolute: 0.3 10*3/uL (ref 0.2–1.0)
Monocytes Relative: 8 %
NEUTROS PCT: 61 %
Neutro Abs: 2.4 10*3/uL (ref 1.4–6.5)
PLATELETS: 163 10*3/uL (ref 150–440)
RBC: 4.64 MIL/uL (ref 4.40–5.90)
RDW: 15 % — ABNORMAL HIGH (ref 11.5–14.5)
WBC: 3.9 10*3/uL (ref 3.8–10.6)

## 2017-05-14 LAB — COMPREHENSIVE METABOLIC PANEL
ALT: 21 U/L (ref 17–63)
AST: 20 U/L (ref 15–41)
Albumin: 3.7 g/dL (ref 3.5–5.0)
Alkaline Phosphatase: 55 U/L (ref 38–126)
Anion gap: 9 (ref 5–15)
BILIRUBIN TOTAL: 1 mg/dL (ref 0.3–1.2)
BUN: 28 mg/dL — ABNORMAL HIGH (ref 6–20)
CHLORIDE: 106 mmol/L (ref 101–111)
CO2: 24 mmol/L (ref 22–32)
CREATININE: 2.01 mg/dL — AB (ref 0.61–1.24)
Calcium: 9.1 mg/dL (ref 8.9–10.3)
GFR, EST AFRICAN AMERICAN: 38 mL/min — AB (ref >60)
GFR, EST NON AFRICAN AMERICAN: 33 mL/min — AB (ref >60)
Glucose, Bld: 114 mg/dL — ABNORMAL HIGH (ref 65–99)
Potassium: 4.2 mmol/L (ref 3.5–5.1)
Sodium: 139 mmol/L (ref 135–145)
TOTAL PROTEIN: 6.4 g/dL — AB (ref 6.5–8.1)

## 2017-05-14 MED ORDER — HEPARIN SOD (PORK) LOCK FLUSH 100 UNIT/ML IV SOLN
500.0000 [IU] | INTRAVENOUS | Status: AC | PRN
  Administered 2017-05-14: 500 [IU]

## 2017-05-14 MED ORDER — SODIUM CHLORIDE 0.9% FLUSH
10.0000 mL | INTRAVENOUS | Status: AC | PRN
  Administered 2017-05-14: 10 mL
  Filled 2017-05-14: qty 10

## 2017-05-14 NOTE — Progress Notes (Signed)
Pt in for follow up, states coumadin was discontinued and is now on Xarelto.  Denies any concerns today.

## 2017-05-15 LAB — CEA: CEA: 4.6 ng/mL (ref 0.0–4.7)

## 2017-06-25 ENCOUNTER — Inpatient Hospital Stay: Payer: Medicare HMO | Attending: Oncology

## 2017-06-25 DIAGNOSIS — C20 Malignant neoplasm of rectum: Secondary | ICD-10-CM

## 2017-06-25 DIAGNOSIS — C186 Malignant neoplasm of descending colon: Secondary | ICD-10-CM | POA: Diagnosis present

## 2017-06-25 DIAGNOSIS — Z452 Encounter for adjustment and management of vascular access device: Secondary | ICD-10-CM | POA: Diagnosis not present

## 2017-06-25 MED ORDER — HEPARIN SOD (PORK) LOCK FLUSH 100 UNIT/ML IV SOLN
500.0000 [IU] | Freq: Once | INTRAVENOUS | Status: AC
  Administered 2017-06-25: 500 [IU] via INTRAVENOUS

## 2017-06-25 MED ORDER — SODIUM CHLORIDE 0.9% FLUSH
10.0000 mL | Freq: Once | INTRAVENOUS | Status: AC
  Administered 2017-06-25: 10 mL via INTRAVENOUS
  Filled 2017-06-25: qty 10

## 2017-08-09 ENCOUNTER — Other Ambulatory Visit: Payer: Self-pay | Admitting: *Deleted

## 2017-08-09 DIAGNOSIS — C20 Malignant neoplasm of rectum: Secondary | ICD-10-CM

## 2017-08-13 ENCOUNTER — Inpatient Hospital Stay: Payer: Medicare HMO | Attending: Oncology

## 2017-08-13 DIAGNOSIS — C20 Malignant neoplasm of rectum: Secondary | ICD-10-CM

## 2017-08-13 DIAGNOSIS — C186 Malignant neoplasm of descending colon: Secondary | ICD-10-CM | POA: Insufficient documentation

## 2017-08-13 LAB — COMPREHENSIVE METABOLIC PANEL
ALT: 21 U/L (ref 0–44)
ANION GAP: 7 (ref 5–15)
AST: 21 U/L (ref 15–41)
Albumin: 3.9 g/dL (ref 3.5–5.0)
Alkaline Phosphatase: 56 U/L (ref 38–126)
BUN: 20 mg/dL (ref 8–23)
CO2: 24 mmol/L (ref 22–32)
CREATININE: 1.67 mg/dL — AB (ref 0.61–1.24)
Calcium: 9 mg/dL (ref 8.9–10.3)
Chloride: 105 mmol/L (ref 98–111)
GFR, EST AFRICAN AMERICAN: 48 mL/min — AB (ref >60)
GFR, EST NON AFRICAN AMERICAN: 41 mL/min — AB (ref >60)
Glucose, Bld: 114 mg/dL — ABNORMAL HIGH (ref 70–99)
POTASSIUM: 4 mmol/L (ref 3.5–5.1)
SODIUM: 136 mmol/L (ref 135–145)
Total Bilirubin: 1.1 mg/dL (ref 0.3–1.2)
Total Protein: 6.6 g/dL (ref 6.5–8.1)

## 2017-08-13 LAB — CBC WITH DIFFERENTIAL/PLATELET
Basophils Absolute: 0 10*3/uL (ref 0–0.1)
Basophils Relative: 1 %
EOS ABS: 0.1 10*3/uL (ref 0–0.7)
Eosinophils Relative: 3 %
HEMATOCRIT: 41.4 % (ref 40.0–52.0)
HEMOGLOBIN: 14.1 g/dL (ref 13.0–18.0)
LYMPHS ABS: 1.1 10*3/uL (ref 1.0–3.6)
LYMPHS PCT: 28 %
MCH: 29.9 pg (ref 26.0–34.0)
MCHC: 34 g/dL (ref 32.0–36.0)
MCV: 87.9 fL (ref 80.0–100.0)
MONOS PCT: 9 %
Monocytes Absolute: 0.4 10*3/uL (ref 0.2–1.0)
NEUTROS ABS: 2.4 10*3/uL (ref 1.4–6.5)
NEUTROS PCT: 59 %
Platelets: 162 10*3/uL (ref 150–440)
RBC: 4.71 MIL/uL (ref 4.40–5.90)
RDW: 15.5 % — ABNORMAL HIGH (ref 11.5–14.5)
WBC: 4 10*3/uL (ref 3.8–10.6)

## 2017-08-13 MED ORDER — HEPARIN SOD (PORK) LOCK FLUSH 100 UNIT/ML IV SOLN
500.0000 [IU] | Freq: Once | INTRAVENOUS | Status: AC
  Administered 2017-08-13: 500 [IU] via INTRAVENOUS

## 2017-08-13 MED ORDER — SODIUM CHLORIDE 0.9% FLUSH
10.0000 mL | INTRAVENOUS | Status: AC | PRN
  Administered 2017-08-13: 10 mL via INTRAVENOUS
  Filled 2017-08-13: qty 10

## 2017-08-14 LAB — CEA: CEA: 8.1 ng/mL — ABNORMAL HIGH (ref 0.0–4.7)

## 2017-08-24 DIAGNOSIS — K08109 Complete loss of teeth, unspecified cause, unspecified class: Secondary | ICD-10-CM | POA: Diagnosis not present

## 2017-08-24 DIAGNOSIS — Z6834 Body mass index (BMI) 34.0-34.9, adult: Secondary | ICD-10-CM | POA: Diagnosis not present

## 2017-08-24 DIAGNOSIS — E669 Obesity, unspecified: Secondary | ICD-10-CM | POA: Diagnosis not present

## 2017-08-24 DIAGNOSIS — R32 Unspecified urinary incontinence: Secondary | ICD-10-CM | POA: Diagnosis not present

## 2017-08-24 DIAGNOSIS — G629 Polyneuropathy, unspecified: Secondary | ICD-10-CM | POA: Diagnosis not present

## 2017-08-24 DIAGNOSIS — I10 Essential (primary) hypertension: Secondary | ICD-10-CM | POA: Diagnosis not present

## 2017-08-24 DIAGNOSIS — G8929 Other chronic pain: Secondary | ICD-10-CM | POA: Diagnosis not present

## 2017-08-24 DIAGNOSIS — I739 Peripheral vascular disease, unspecified: Secondary | ICD-10-CM | POA: Diagnosis not present

## 2017-08-24 DIAGNOSIS — Z720 Tobacco use: Secondary | ICD-10-CM | POA: Diagnosis not present

## 2017-08-24 DIAGNOSIS — I4891 Unspecified atrial fibrillation: Secondary | ICD-10-CM | POA: Diagnosis not present

## 2017-10-01 ENCOUNTER — Inpatient Hospital Stay: Payer: Medicare HMO | Attending: Oncology

## 2017-10-01 DIAGNOSIS — C186 Malignant neoplasm of descending colon: Secondary | ICD-10-CM | POA: Diagnosis present

## 2017-10-01 DIAGNOSIS — Z95828 Presence of other vascular implants and grafts: Secondary | ICD-10-CM

## 2017-10-01 DIAGNOSIS — Z23 Encounter for immunization: Secondary | ICD-10-CM | POA: Diagnosis not present

## 2017-10-01 DIAGNOSIS — Z0000 Encounter for general adult medical examination without abnormal findings: Secondary | ICD-10-CM | POA: Diagnosis not present

## 2017-10-01 DIAGNOSIS — Z452 Encounter for adjustment and management of vascular access device: Secondary | ICD-10-CM | POA: Diagnosis not present

## 2017-10-01 MED ORDER — SODIUM CHLORIDE 0.9% FLUSH
10.0000 mL | Freq: Once | INTRAVENOUS | Status: AC
  Administered 2017-10-01: 10 mL via INTRAVENOUS
  Filled 2017-10-01: qty 10

## 2017-10-01 MED ORDER — HEPARIN SOD (PORK) LOCK FLUSH 100 UNIT/ML IV SOLN
500.0000 [IU] | Freq: Once | INTRAVENOUS | Status: AC
  Administered 2017-10-01: 500 [IU] via INTRAVENOUS

## 2017-11-15 ENCOUNTER — Ambulatory Visit: Admission: RE | Admit: 2017-11-15 | Payer: Medicare HMO | Source: Ambulatory Visit

## 2017-11-18 NOTE — Progress Notes (Deleted)
[place]  [phone] Fax:(336) [phone]  ID: James Keller OB: 1950/12/12  MR#: [id number]  QIH#:[id number]  Patient Care Team: Herminio Commons, MD as PCP - General (Family Medicine) Marlyce Huge, MD as Attending Physician (Surgery) Lucilla Lame, MD as Consulting Physician (Gastroenterology)  CHIEF COMPLAINT: Stage IIIB adenocarcinoma of the desending colon, right kidney mass.  INTERVAL HISTORY: Patient returns to clinic today for repeat laboratory work and routine six-month evaluation.  He continues to feel well and remains asymptomatic.  He does not complain of a peripheral neuropathy today.  He has no neurologic complaints. He denies any fevers or illnesses. He has no chest pain or shortness of breath. He denies any weight loss. He has no nausea, vomiting, diarrhea, or constipation.  He denies any changes in his bowel movements.  He denies any melena or hematochezia. Patient offers no specific complaints today.   REVIEW OF SYSTEMS:   Review of Systems  Constitutional: Negative.  Negative for fever, malaise/fatigue and weight loss.  Respiratory: Negative.  Negative for shortness of breath.   Cardiovascular: Negative.  Negative for chest pain and leg swelling.  Gastrointestinal: Negative.  Negative for abdominal pain, blood in stool and melena.  Genitourinary: Negative.  Negative for frequency, hematuria and urgency.  Musculoskeletal: Negative.   Skin: Negative.   Neurological: Negative.  Negative for sensory change, focal weakness and weakness.  Psychiatric/Behavioral: Negative.  The patient is not nervous/anxious.     As per HPI. Otherwise, a complete review of systems is negative.  PAST MEDICAL HISTORY: Past Medical History:  Diagnosis Date  . Colon cancer ([place])    a. 11/2013 s/p colon resection/Hartmann's procedure;  b. Chemo: FOLFOX completed 07/2014.  Marland Kitchen DVT (deep venous thrombosis) ([place])    RIGHT LEG/ HX OF  . DVT (deep  venous thrombosis) ([place])   . Dysrhythmia   . Hypertension   . Lower extremity edema    Rt leg  . 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 PAF (paroxysmal atrial fibrillation) ([place])   . S/P chemotherapy, time since less than 4 weeks   . Tobacco abuse     PAST SURGICAL HISTORY: Past Surgical History:  Procedure Laterality Date  . COLON RESECTION  12/19/2013   colostomy  . COLON SURGERY    . COLONOSCOPY WITH PROPOFOL N/A 09/14/2014   Procedure: COLONOSCOPY WITH PROPOFOL THROUGH COLOSTOMY AND COLONOSCOPY THRU RECTUM;  Surgeon: Lucilla Lame, MD;  Location: [facility];  Service: Endoscopy;  Laterality: N/A;  TRANSVERSE COLON POLYP AND SIGMOID COLON POLYP  . COLONOSCOPY WITH PROPOFOL N/A 11/29/2015   Procedure: COLONOSCOPY WITH PROPOFOL;  Surgeon: [name], MD;  Location: [facility];  Service: Endoscopy;  Laterality: N/A;  . COLOSTOMY REVERSAL N/A 10/28/2014   Procedure: COLOSTOMY REVERSAL;  Surgeon: Marlyce Huge, MD;  Location: [facility];  Service: General;  Laterality: N/A;  . FEMUR FRACTURE SURGERY Right   . LEG SURGERY    . LYSIS OF ADHESION  10/28/2014   Procedure: LYSIS OF ADHESION;  Surgeon: Marlyce Huge, MD;  Location: [facility];  Service: General;;  . PORT A CATH INJECTION ([place] HX) Right   . TUNNELED VENOUS PORT PLACEMENT      FAMILY HISTORY: Grandmother with "liver cancer".     ADVANCED DIRECTIVES:    HEALTH MAINTENANCE: Social History   Tobacco Use  . Smoking status: Current Some Day Smoker    Packs/day: 0.50    Years: 54.00    Pack years:  27.00    Types: Cigarettes  . Smokeless tobacco: Never Used  Substance Use Topics  . Alcohol use: Yes    Alcohol/week: 6.0 standard drinks    Types: 6 Cans of beer per week    Comment: moderate beer  . Drug use: Yes    Types: Marijuana    Comment: occasional     Colonoscopy:  PAP:  Bone density:  Lipid panel:  Allergies  Allergen Reactions  . No Known Allergies       Current Outpatient Medications  Medication Sig Dispense Refill  . amiodarone (PACERONE) 200 MG tablet Take 200 mg by mouth daily.     Marland Kitchen amLODipine (NORVASC) 10 MG tablet Take 1 tablet by mouth daily.    Marland Kitchen gabapentin (NEURONTIN) 300 MG capsule Take 1 capsule (300 mg total) by mouth 3 (three) times daily. 90 capsule 1  . lisinopril (PRINIVIL,ZESTRIL) 20 MG tablet Take 1 tablet by mouth daily. Reported on 08/12/2015    . metoprolol tartrate (LOPRESSOR) 25 MG tablet Take 25 mg by mouth 2 (two) times daily. Am and pm    . rivaroxaban (XARELTO) 20 MG TABS tablet Take 20 mg by mouth daily with supper.     No current facility-administered medications for this visit.    Facility-Administered Medications Ordered in Other Visits  Medication Dose Route Frequency Provider Last Rate Last Dose  . heparin lock flush 100 unit/mL  500 Units Intracatheter PRN Lloyd Huger, MD      . sodium chloride 0.9 % injection 10 mL  10 mL Intracatheter PRN Lloyd Huger, MD   10 mL at 10/19/14 1353  . sodium chloride flush (NS) 0.9 % injection 10 mL  10 mL Intracatheter PRN Lloyd Huger, MD      . sodium chloride flush (NS) 0.9 % injection 10 mL  10 mL Intravenous PRN Lloyd Huger, MD   10 mL at 08/13/17 1442    OBJECTIVE: There were no vitals filed for this visit.   There is no height or weight on file to calculate BMI.    ECOG FS:0 - Asymptomatic  General: Well-developed, well-nourished, no acute distress. Eyes: Pink conjunctiva, anicteric sclera. Lungs: Clear to auscultation bilaterally. Heart: Regular rate and rhythm. No rubs, murmurs, or gallops. Abdomen: Soft, nontender, nondistended. No organomegaly noted, normoactive bowel sounds. Musculoskeletal: No edema, cyanosis, or clubbing. Neuro: Alert, answering all questions appropriately. Cranial nerves grossly intact. Skin: No rashes or petechiae noted. Psych: Normal affect.  LAB RESULTS:  Lab Results  Component Value Date   NA  136 08/13/2017   K 4.0 08/13/2017   CL 105 08/13/2017   CO2 24 08/13/2017   GLUCOSE 114 (H) 08/13/2017   BUN 20 08/13/2017   CREATININE 1.67 (H) 08/13/2017   CALCIUM 9.0 08/13/2017   PROT 6.6 08/13/2017   ALBUMIN 3.9 08/13/2017   AST 21 08/13/2017   ALT 21 08/13/2017   ALKPHOS 56 08/13/2017   BILITOT 1.1 08/13/2017   GFRNONAA 41 (L) 08/13/2017   GFRAA 48 (L) 08/13/2017    Lab Results  Component Value Date   WBC 4.0 08/13/2017   NEUTROABS 2.4 08/13/2017   HGB 14.1 08/13/2017   HCT 41.4 08/13/2017   MCV 87.9 08/13/2017   PLT 162 08/13/2017    STUDIES:    No results found.  ASSESSMENT: Stage IIIB adenocarcinoma of the desending colon, DVT, right kidney mass.  PLAN:    1. Stage IIIB adenocarcinoma of the desending colon: Patient completed 12 cycles of  FOLFOX on July 29, 2014.  His CEA is within normal limits today at 4.6.  MRI results from November 10, 2016 reviewed independently with no evidence of recurrent or metastatic disease. Patient had a colonoscopy on November 29, 2015 that was reported as normal. Return to clinic in 6 months for laboratory work and further evaluation.  2. H/o DVT: Patient has discontinued Coumadin and is now on Xarelto. 3. Elevated creatinine: Patient's creatinine has trended up and is now greater than 2.0.  A referral has been made to nephrology. 4. Neuropathy: Patient does not complain of this today.   5. Right kidney mass: MRI results from November 10, 2016 reviewed independently with no appreciable change.  Still remains highly suspicious for renal cell carcinoma, but no intervention is needed at this time.  We will repeat MRI of the abdomen in 6 months to assess for interval change. Will refer back to urology if there are any questions or concerns.  Patient expressed understanding and was in agreement with this plan. He also understands that He can call clinic at any time with any questions, concerns, or complaints.   Colon cancer   Staging form:  Colon/Rectum, AJCC 7th Edition     Pathologic stage from 05/04/2014: Stage IIIB (T4, N1, M0) - Signed by Lloyd Huger, MD on 05/04/2014   Lloyd Huger, MD   11/18/2017 11:27 PM

## 2017-11-19 ENCOUNTER — Inpatient Hospital Stay: Payer: Medicare HMO | Admitting: Oncology

## 2017-11-19 ENCOUNTER — Inpatient Hospital Stay: Payer: Medicare HMO

## 2018-11-14 NOTE — Progress Notes (Signed)
Called patient no answer unable to leave message mailbox is full 

## 2018-11-14 NOTE — Progress Notes (Signed)
[place]  Telephone:(336) [phone] Fax:(336) [phone]  ID: James Keller OB: January 29, 1950  MR#: [id number]  [id number]  Patient Care Team: Herminio Commons, MD as PCP - General (Family Medicine) Marlyce Huge, MD as Attending Physician (Surgery) Lucilla Lame, MD as Consulting Physician (Gastroenterology)  CHIEF COMPLAINT: Stage IIIB adenocarcinoma of the desending colon, right kidney mass.  INTERVAL HISTORY: Patient last evaluated in clinic in April 2019.  He has skipped multiple appointments in the interim.  He returns to clinic today with complaints of bright red blood per rectum 1 time several weeks ago as well as some vague abdominal pain.  He otherwise feels well.  He does not complain of a peripheral neuropathy today.  He has no neurologic complaints. He denies any fevers or illnesses.  He denies any chest pain, shortness of breath, cough, or hemoptysis.  He denies any weight loss. He has no nausea, vomiting, diarrhea, or constipation.  He denies any changes in his bowel movements.  Patient offers no further specific complaints today.  REVIEW OF SYSTEMS:   Review of Systems  Constitutional: Negative.  Negative for fever, malaise/fatigue and weight loss.  Respiratory: Negative.  Negative for cough, hemoptysis and shortness of breath.   Cardiovascular: Negative.  Negative for chest pain and leg swelling.  Gastrointestinal: Positive for abdominal pain and blood in stool. Negative for melena.  Genitourinary: Negative.  Negative for frequency, hematuria and urgency.  Musculoskeletal: Negative.  Negative for back pain.  Skin: Negative.  Negative for rash.  Neurological: Negative.  Negative for dizziness, sensory change, focal weakness, weakness and headaches.  Psychiatric/Behavioral: Negative.  The patient is not nervous/anxious.     As per HPI. Otherwise, a complete review of systems is negative.  PAST MEDICAL HISTORY: Past Medical History:   Diagnosis Date  . Colon cancer ([place])    a. 11/2013 s/p colon resection/Hartmann's procedure;  b. Chemo: FOLFOX completed 07/2014.  Marland Kitchen DVT (deep venous thrombosis) ([place])    RIGHT LEG/ HX OF  . DVT (deep venous thrombosis) ([place])   . Dysrhythmia   . Hypertension   . Lower extremity edema    Rt leg  . 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 PAF (paroxysmal atrial fibrillation) ([place])   . S/P chemotherapy, time since less than 4 weeks   . Tobacco abuse     PAST SURGICAL HISTORY: Past Surgical History:  Procedure Laterality Date  . COLON RESECTION  12/19/2013   colostomy  . COLON SURGERY    . COLONOSCOPY WITH PROPOFOL N/A 09/14/2014   Procedure: COLONOSCOPY WITH PROPOFOL THROUGH COLOSTOMY AND COLONOSCOPY THRU RECTUM;  Surgeon: Lucilla Lame, MD;  Location: [facility];  Service: Endoscopy;  Laterality: N/A;  TRANSVERSE COLON POLYP AND SIGMOID COLON POLYP  . COLONOSCOPY WITH PROPOFOL N/A 11/29/2015   Procedure: COLONOSCOPY WITH PROPOFOL;  Surgeon: [name], MD;  Location: [facility];  Service: Endoscopy;  Laterality: N/A;  . COLOSTOMY REVERSAL N/A 10/28/2014   Procedure: COLOSTOMY REVERSAL;  Surgeon: Marlyce Huge, MD;  Location: [facility];  Service: General;  Laterality: N/A;  . FEMUR FRACTURE SURGERY Right   . LEG SURGERY    . LYSIS OF ADHESION  10/28/2014   Procedure: LYSIS OF ADHESION;  Surgeon: Marlyce Huge, MD;  Location: [facility];  Service: General;;  . PORT A CATH INJECTION ([place] HX) Right   . TUNNELED VENOUS PORT PLACEMENT      FAMILY HISTORY: Grandmother with "liver cancer".  ADVANCED DIRECTIVES:    HEALTH MAINTENANCE: Social History   Tobacco Use  . Smoking status: Current Every Day Smoker    Packs/day: 0.50    Years: 54.00    Pack years: 27.00    Types: Cigarettes  . Smokeless tobacco: Never Used  Substance Use Topics  . Alcohol use: Yes    Alcohol/week: 6.0 standard drinks    Types: 6 Cans of beer per week     Comment: moderate beer  . Drug use: Yes    Types: Marijuana    Comment: occasional     Colonoscopy:  PAP:  Bone density:  Lipid panel:  Allergies  Allergen Reactions  . No Known Allergies     Current Outpatient Medications  Medication Sig Dispense Refill  . amiodarone (PACERONE) 200 MG tablet Take 200 mg by mouth daily.     Marland Kitchen amLODipine (NORVASC) 10 MG tablet Take 1 tablet by mouth daily.    Marland Kitchen gabapentin (NEURONTIN) 300 MG capsule Take 1 capsule (300 mg total) by mouth 3 (three) times daily. 90 capsule 1  . lisinopril (PRINIVIL,ZESTRIL) 20 MG tablet Take 1 tablet by mouth daily. Reported on 08/12/2015    . metoprolol tartrate (LOPRESSOR) 25 MG tablet Take 25 mg by mouth 2 (two) times daily. Am and pm    . rivaroxaban (XARELTO) 20 MG TABS tablet Take 20 mg by mouth daily with supper.     No current facility-administered medications for this visit.    Facility-Administered Medications Ordered in Other Visits  Medication Dose Route Frequency Provider Last Rate Last Dose  . heparin lock flush 100 unit/mL  500 Units Intracatheter PRN Lloyd Huger, MD      . sodium chloride 0.9 % injection 10 mL  10 mL Intracatheter PRN Lloyd Huger, MD   10 mL at 10/19/14 1353  . sodium chloride flush (NS) 0.9 % injection 10 mL  10 mL Intracatheter PRN Lloyd Huger, MD      . sodium chloride flush (NS) 0.9 % injection 10 mL  10 mL Intravenous PRN Lloyd Huger, MD   10 mL at 08/13/17 1442    OBJECTIVE: Vitals:   11/15/18 1314  BP: (!) 143/72  Pulse: 62  Temp: 97.6 F (36.4 C)     Body mass index is 31.28 kg/m.    ECOG FS:0 - Asymptomatic  General: Well-developed, well-nourished, no acute distress. Eyes: Pink conjunctiva, anicteric sclera. HEENT: Normocephalic, moist mucous membranes. Lungs: Clear to auscultation bilaterally. Heart: Regular rate and rhythm. No rubs, murmurs, or gallops. Abdomen: Soft, nontender, nondistended. No organomegaly noted, normoactive  bowel sounds. Musculoskeletal: No edema, cyanosis, or clubbing. Neuro: Alert, answering all questions appropriately. Cranial nerves grossly intact. Skin: No rashes or petechiae noted. Psych: Normal affect.  LAB RESULTS:  Lab Results  Component Value Date   NA 139 11/15/2018   K 4.5 11/15/2018   CL 108 11/15/2018   CO2 24 11/15/2018   GLUCOSE 103 (H) 11/15/2018   BUN 31 (H) 11/15/2018   CREATININE 1.63 (H) 11/15/2018   CALCIUM 9.2 11/15/2018   PROT 6.9 11/15/2018   ALBUMIN 4.2 11/15/2018   AST 17 11/15/2018   ALT 19 11/15/2018   ALKPHOS 54 11/15/2018   BILITOT 1.2 11/15/2018   GFRNONAA 43 (L) 11/15/2018   GFRAA 50 (L) 11/15/2018    Lab Results  Component Value Date   WBC 3.9 (L) 11/15/2018   NEUTROABS 2.4 11/15/2018   HGB 12.4 (L) 11/15/2018   HCT 37.5 (L) 11/15/2018  MCV 88.9 11/15/2018   PLT 158 11/15/2018    STUDIES:    No results found.  ASSESSMENT: Stage IIIB adenocarcinoma of the desending colon, DVT, right kidney mass.  PLAN:    1. Stage IIIB adenocarcinoma of the desending colon: Patient completed 12 cycles of FOLFOX on July 29, 2014.  His most recent imaging results from November 10, 2016 did not reveal any evidence of recurrent or metastatic disease.  His last colonoscopy on November 29, 2015 that was reported as normal.  Patient was noted to have an elevated CEA in July 2019, but then did not return for any additional follow-up.  Repeat CEA from today is pending.  Will get repeat imaging in the next week.  Patient was also given a referral back to GI given his bright red blood per rectum.  He likely will need a repeat colonoscopy since it has been nearly 3 years since his last luminal investigation.  Return to clinic 1 to 2 days after his imaging to discuss the results and any further diagnostic testing necessary.    2. H/o DVT: Patient has discontinued Coumadin and is now on Xarelto. 3. Elevated creatinine: Patient's creatinine is 1.63 which is chronic and  unchanged. 4. Neuropathy: Patient does not complain of this today.   5. Right kidney mass: MRI results from November 10, 2016 reviewed independently with no appreciable change.  Still remains highly suspicious for renal cell carcinoma, but no intervention is needed at this time.  Repeat imaging as above.  Consider referral back to urology if there is any appreciable change. 6.  Anemia: Although patient's hemoglobin is 12.4, this is trended down over the past year when it was 14.1.  Patient expressed understanding and was in agreement with this plan. He also understands that He can call clinic at any time with any questions, concerns, or complaints.   Colon cancer   Staging form: Colon/Rectum, AJCC 7th Edition     Pathologic stage from 05/04/2014: Stage IIIB (T4, N1, M0) - Signed by Lloyd Huger, MD on 05/04/2014   Lloyd Huger, MD   11/16/2018 7:42 AM

## 2018-11-15 ENCOUNTER — Other Ambulatory Visit: Payer: Self-pay

## 2018-11-15 ENCOUNTER — Encounter: Payer: Self-pay | Admitting: Oncology

## 2018-11-15 ENCOUNTER — Inpatient Hospital Stay: Payer: Medicare HMO | Attending: Oncology | Admitting: Oncology

## 2018-11-15 ENCOUNTER — Inpatient Hospital Stay: Payer: Medicare HMO | Admitting: *Deleted

## 2018-11-15 DIAGNOSIS — F17210 Nicotine dependence, cigarettes, uncomplicated: Secondary | ICD-10-CM | POA: Insufficient documentation

## 2018-11-15 DIAGNOSIS — Z95828 Presence of other vascular implants and grafts: Secondary | ICD-10-CM

## 2018-11-15 DIAGNOSIS — C186 Malignant neoplasm of descending colon: Secondary | ICD-10-CM

## 2018-11-15 DIAGNOSIS — C772 Secondary and unspecified malignant neoplasm of intra-abdominal lymph nodes: Secondary | ICD-10-CM

## 2018-11-15 DIAGNOSIS — N2889 Other specified disorders of kidney and ureter: Secondary | ICD-10-CM | POA: Insufficient documentation

## 2018-11-15 DIAGNOSIS — K625 Hemorrhage of anus and rectum: Secondary | ICD-10-CM | POA: Diagnosis not present

## 2018-11-15 DIAGNOSIS — D649 Anemia, unspecified: Secondary | ICD-10-CM | POA: Insufficient documentation

## 2018-11-15 DIAGNOSIS — Z86718 Personal history of other venous thrombosis and embolism: Secondary | ICD-10-CM | POA: Diagnosis not present

## 2018-11-15 LAB — COMPREHENSIVE METABOLIC PANEL
ALT: 19 U/L (ref 0–44)
AST: 17 U/L (ref 15–41)
Albumin: 4.2 g/dL (ref 3.5–5.0)
Alkaline Phosphatase: 54 U/L (ref 38–126)
Anion gap: 7 (ref 5–15)
BUN: 31 mg/dL — ABNORMAL HIGH (ref 8–23)
CO2: 24 mmol/L (ref 22–32)
Calcium: 9.2 mg/dL (ref 8.9–10.3)
Chloride: 108 mmol/L (ref 98–111)
Creatinine, Ser: 1.63 mg/dL — ABNORMAL HIGH (ref 0.61–1.24)
GFR calc Af Amer: 50 mL/min — ABNORMAL LOW (ref >60)
GFR calc non Af Amer: 43 mL/min — ABNORMAL LOW (ref >60)
Glucose, Bld: 103 mg/dL — ABNORMAL HIGH (ref 70–99)
Potassium: 4.5 mmol/L (ref 3.5–5.1)
Sodium: 139 mmol/L (ref 135–145)
Total Bilirubin: 1.2 mg/dL (ref 0.3–1.2)
Total Protein: 6.9 g/dL (ref 6.5–8.1)

## 2018-11-15 LAB — CBC WITH DIFFERENTIAL/PLATELET
Abs Immature Granulocytes: 0.02 10*3/uL (ref 0.00–0.07)
Basophils Absolute: 0 10*3/uL (ref 0.0–0.1)
Basophils Relative: 0 %
Eosinophils Absolute: 0.2 10*3/uL (ref 0.0–0.5)
Eosinophils Relative: 4 %
HCT: 37.5 % — ABNORMAL LOW (ref 39.0–52.0)
Hemoglobin: 12.4 g/dL — ABNORMAL LOW (ref 13.0–17.0)
Immature Granulocytes: 1 %
Lymphocytes Relative: 26 %
Lymphs Abs: 1 10*3/uL (ref 0.7–4.0)
MCH: 29.4 pg (ref 26.0–34.0)
MCHC: 33.1 g/dL (ref 30.0–36.0)
MCV: 88.9 fL (ref 80.0–100.0)
Monocytes Absolute: 0.4 10*3/uL (ref 0.1–1.0)
Monocytes Relative: 9 %
Neutro Abs: 2.4 10*3/uL (ref 1.7–7.7)
Neutrophils Relative %: 60 %
Platelets: 158 10*3/uL (ref 150–400)
RBC: 4.22 MIL/uL (ref 4.22–5.81)
RDW: 14.6 % (ref 11.5–15.5)
WBC: 3.9 10*3/uL — ABNORMAL LOW (ref 4.0–10.5)
nRBC: 0 % (ref 0.0–0.2)

## 2018-11-15 MED ORDER — SODIUM CHLORIDE 0.9% FLUSH
10.0000 mL | Freq: Once | INTRAVENOUS | Status: AC
  Administered 2018-11-15: 14:00:00 10 mL via INTRAVENOUS
  Filled 2018-11-15: qty 10

## 2018-11-15 MED ORDER — HEPARIN SOD (PORK) LOCK FLUSH 100 UNIT/ML IV SOLN
500.0000 [IU] | Freq: Once | INTRAVENOUS | Status: AC
  Administered 2018-11-15: 14:00:00 500 [IU] via INTRAVENOUS

## 2018-11-15 NOTE — Progress Notes (Signed)
Pt reports 2 weeks ago seeing blood on toilet paper after wiping after episode of loose stool, no bleeding since then. Some soreness around navel.

## 2018-11-18 ENCOUNTER — Encounter: Payer: Self-pay | Admitting: *Deleted

## 2018-11-18 LAB — CEA: CEA: 6.8 ng/mL — ABNORMAL HIGH (ref 0.0–4.7)

## 2018-11-24 NOTE — Progress Notes (Signed)
James Keller  [phone] [phone]  ID: James Keller OB: [birth year]  MR#: [id number]  [id number]  Patient Care Team: Herminio Commons, MD as PCP - General (Family Medicine) Marlyce Huge, MD as Attending Physician (Surgery) Lucilla Lame, MD as Consulting Physician (Gastroenterology)  CHIEF COMPLAINT: Stage IIIB adenocarcinoma of the desending colon, right kidney mass.  INTERVAL HISTORY: Patient returns to clinic today for further evaluation and discussion of his imaging results.  He is anxious, but otherwise feels well.  He has no further bright red blood per rectum. He has no neurologic complaints. He denies any fevers or illnesses.  He denies any chest pain, shortness of breath, cough, or hemoptysis.  He denies any weight loss.  He denies any abdominal pain.  He has no nausea, vomiting, diarrhea, or constipation.  He denies any changes in his bowel movements.  He has no urinary complaints.  Patient offers no further specific complaints today.  REVIEW OF SYSTEMS:   Review of Systems  Constitutional: Negative.  Negative for fever, malaise/fatigue and weight loss.  Respiratory: Negative.  Negative for cough, hemoptysis and shortness of breath.   Cardiovascular: Negative.  Negative for chest pain and leg swelling.  Gastrointestinal: Negative.  Negative for abdominal pain, blood in stool and melena.  Genitourinary: Negative.  Negative for frequency, hematuria and urgency.  Musculoskeletal: Negative.  Negative for back pain.  Skin: Negative.  Negative for rash.  Neurological: Negative.  Negative for dizziness, sensory change, focal weakness, weakness and headaches.  Psychiatric/Behavioral: The patient is nervous/anxious.     As per HPI. Otherwise, a complete review of systems is negative.  PAST MEDICAL HISTORY: Past Medical History:  Diagnosis Date   Colon cancer ([place])    a. 11/2013 s/p colon resection/Hartmann's procedure;   b. Chemo: FOLFOX completed 07/2014.   DVT (deep venous thrombosis) ([facility])    RIGHT LEG/ HX OF   DVT (deep venous thrombosis) ([facility])    Dysrhythmia    Hypertension    Lower extremity edema    Rt leg   Neuromuscular disorder ([facility])    TINGLING IN FINGERS and feet   PAF (paroxysmal atrial fibrillation) ([facility])    S/P chemotherapy, time since less than 4 weeks    Tobacco abuse     PAST SURGICAL HISTORY: Past Surgical History:  Procedure Laterality Date   COLON RESECTION  12/19/2013   colostomy   COLON SURGERY     COLONOSCOPY WITH PROPOFOL N/A 09/14/2014   Procedure: COLONOSCOPY WITH PROPOFOL THROUGH COLOSTOMY AND COLONOSCOPY THRU RECTUM;  Surgeon: Lucilla Lame, MD;  Location: [facility];  Service: Endoscopy;  Laterality: N/A;  TRANSVERSE COLON POLYP AND SIGMOID COLON POLYP   COLONOSCOPY WITH PROPOFOL N/A 11/29/2015   Procedure: COLONOSCOPY WITH PROPOFOL;  Surgeon: [name], MD;  Location: [facility];  Service: Endoscopy;  Laterality: N/A;   COLOSTOMY REVERSAL N/A 10/28/2014   Procedure: COLOSTOMY REVERSAL;  Surgeon: Marlyce Huge, MD;  Location: [facility];  Service: General;  Laterality: N/A;   FEMUR FRACTURE SURGERY Right    LEG SURGERY     LYSIS OF ADHESION  10/28/2014   Procedure: LYSIS OF ADHESION;  Surgeon: Marlyce Huge, MD;  Location: [facility];  Service: General;;   PORT A CATH INJECTION ([place] HX) Right    TUNNELED VENOUS PORT PLACEMENT      FAMILY HISTORY: Grandmother with "liver cancer".     ADVANCED DIRECTIVES:    HEALTH MAINTENANCE: Social History   Tobacco Use   Smoking status:  Current Every Day Smoker    Packs/day: 0.50    Years: 54.00    Pack years: 27.00    Types: Cigarettes   Smokeless tobacco: Never Used  Substance Use Topics   Alcohol use: Yes    Alcohol/week: 6.0 standard drinks    Types: 6 Cans of beer per week    Comment: moderate beer   Drug use: Yes    Types: Marijuana    Comment: occasional      Colonoscopy:  PAP:  Bone density:  Lipid panel:  Allergies  Allergen Reactions   No Known Allergies     Current Outpatient Medications  Medication Sig Dispense Refill   amiodarone (PACERONE) 200 MG tablet Take 200 mg by mouth daily.      amLODipine (NORVASC) 10 MG tablet Take 1 tablet by mouth daily.     gabapentin (NEURONTIN) 300 MG capsule Take 1 capsule (300 mg total) by mouth 3 (three) times daily. 90 capsule 1   lisinopril (PRINIVIL,ZESTRIL) 20 MG tablet Take 1 tablet by mouth daily. Reported on 08/12/2015     metoprolol tartrate (LOPRESSOR) 25 MG tablet Take 25 mg by mouth 2 (two) times daily. Am and pm     rivaroxaban (XARELTO) 20 MG TABS tablet Take 20 mg by mouth daily with supper.     No current facility-administered medications for this visit.    Facility-Administered Medications Ordered in Other Visits  Medication Dose Route Frequency Provider Last Rate Last Dose   heparin lock flush 100 unit/mL  500 Units Intracatheter PRN Grayland Ormond, Kathlene November, MD       sodium chloride 0.9 % injection 10 mL  10 mL Intracatheter PRN Lloyd Huger, MD   10 mL at 10/19/14 1353   sodium chloride flush (NS) 0.9 % injection 10 mL  10 mL Intracatheter PRN Lloyd Huger, MD       sodium chloride flush (NS) 0.9 % injection 10 mL  10 mL Intravenous PRN Lloyd Huger, MD   10 mL at 08/13/17 1442    OBJECTIVE: Vitals:   11/29/18 1326  BP: (!) 146/74  Pulse: 60  Resp: 16  Temp: 98.6 F (37 C)  SpO2: 100%     Body mass index is 31.14 kg/m.    ECOG FS:0 - Asymptomatic  General: Well-developed, well-nourished, no acute distress. Eyes: Pink conjunctiva, anicteric sclera. HEENT: Normocephalic, moist mucous membranes. Lungs: Clear to auscultation bilaterally. Heart: Regular rate and rhythm. No rubs, murmurs, or gallops. Abdomen: Soft, nontender, nondistended. No organomegaly noted, normoactive bowel sounds. Musculoskeletal: No edema, cyanosis, or  clubbing. Neuro: Alert, answering all questions appropriately. Cranial nerves grossly intact. Skin: No rashes or petechiae noted. Psych: Normal affect.  LAB RESULTS:  Lab Results  Component Value Date   NA 139 11/15/2018   K 4.5 11/15/2018   CL 108 11/15/2018   CO2 24 11/15/2018   GLUCOSE 103 (H) 11/15/2018   BUN 31 (H) 11/15/2018   CREATININE 1.63 (H) 11/15/2018   CALCIUM 9.2 11/15/2018   PROT 6.9 11/15/2018   ALBUMIN 4.2 11/15/2018   AST 17 11/15/2018   ALT 19 11/15/2018   ALKPHOS 54 11/15/2018   BILITOT 1.2 11/15/2018   GFRNONAA 43 (L) 11/15/2018   GFRAA 50 (L) 11/15/2018    Lab Results  Component Value Date   WBC 3.9 (L) 11/15/2018   NEUTROABS 2.4 11/15/2018   HGB 12.4 (L) 11/15/2018   HCT 37.5 (L) 11/15/2018   MCV 88.9 11/15/2018   PLT 158 11/15/2018  STUDIES:    Mr Abdomen W Wo Contrast  Result Date: 11/27/2018 CLINICAL DATA:  History of colon cancer, lesion in right kidney. Follow-up from previous MRI of 2018. EXAM: MRI ABDOMEN WITHOUT AND WITH CONTRAST TECHNIQUE: Multiplanar multisequence MR imaging of the abdomen was performed both before and after the administration of intravenous contrast. CONTRAST:  28mL GADAVIST GADOBUTROL 1 MMOL/ML IV SOLN COMPARISON:  MRI of 05/01/2016, CT abdomen and pelvis of 09/08/2015 FINDINGS: Lower chest: No signs of effusion or dense consolidation, imaging of the lower chest is limited secondary to MR technique. Hepatobiliary: No focal, suspicious hepatic lesion. Portal vein is patent. Pancreas: No mass, inflammatory changes, or other parenchymal abnormality identified. Spleen:  Within normal limits in size and appearance. Adrenals/Urinary Tract: Right adrenal adenoma similar to study of 2017. Bilateral renal cysts are present. Lesion of concern is endophytic, located in the lower pole of the right kidney and measures 2.7 x 1.8 cm. Previously this area measured approximately 2.6 x 2.0 cm. In 2016 this area measured 2.5 by 2.0 cm.  Lesion is T2 hyperintense, T1 hypointense with signs of internal septation a. Some subtle intrinsic T1 hyperintensity centrally adjacent to some of the areas of septation. Area of more profound dependent nodularity is smaller on the current study. It measures 4 mm today and measured approximately 8 mm on the prior study. Stomach/Bowel: Limited assessment of gastrointestinal tract is normal to the extent visualized. Vascular/Lymphatic: Infrarenal abdominal aortic aneurysm with eccentric soft plaque is stable at approximately 3.7 by 3.3 cm in greatest axial dimension just above the iliac bifurcation. No signs of retroperitoneal adenopathy. Renal veins are patent bilaterally. Other:  No signs of ascites. Musculoskeletal: No acute bone finding or destructive bone process. IMPRESSION: 1. Lesion in the lower pole the right kidney is within 2-3 of its measurement in 2016. Signs of focal septal thickening are suggested on subtraction images, approximately 3 mm in thickness, suggesting Bosniak III cystic lesion. Raising the question of cystic renal cell carcinoma but with very little change over time. Urologic consultation for follow-up and/or further management if not yet performed may be helpful. 2. No evidence of metastatic disease in the abdomen. 3. Stable right adrenal adenoma. 4. Infrarenal abdominal aortic aneurysm with eccentric soft plaque measuring up to 3.7 cm. Recommend followup by ultrasound in 2 years. This recommendation follows ACR consensus guidelines: White Paper of the ACR Incidental Findings Committee II on Vascular Findings. Electronically Signed   By: Zetta Bills M.D.   On: 11/27/2018 09:40    ASSESSMENT: Stage IIIB adenocarcinoma of the desending colon, DVT, right kidney mass.  PLAN:    1. Stage IIIB adenocarcinoma of the desending colon: Patient completed 12 cycles of FOLFOX on July 29, 2014.  MRI results from November 27, 2018 reviewed independently and reported as above with no obvious  evidence of recurrent or metastatic disease.  Patient CEA remains mildly elevated at 6.8, but this is improved from previous.  He has been instructed to keep his previously scheduled follow-up appointment with GI for further evaluation and consideration of repeat colonoscopy.  No further intervention is needed.  Return to clinic in 6 months with repeat laboratory work and further evaluation.   2. H/o DVT: Patient has discontinued Coumadin and is now on Xarelto. 3. Elevated creatinine: Patient's most recent creatinine is 1.63 which is chronic and unchanged. 4. Neuropathy: Patient does not complain of this today.   5. Right kidney mass: MRI results as above.  No changes.  Consider repeat imaging  in 1 year.  6.  Anemia: Although patient's hemoglobin is 12.4, this is trended down over the past year when it was 14.1.  Monitor.  Patient expressed understanding and was in agreement with this plan. He also understands that He can call clinic at any time with any questions, concerns, or complaints.   Colon cancer   Staging form: Colon/Rectum, AJCC 7th Edition     Pathologic stage from 05/04/2014: Stage IIIB (T4, N1, M0) - Signed by Lloyd Huger, MD on 05/04/2014   Lloyd Huger, MD   11/29/2018 2:05 PM

## 2018-11-27 ENCOUNTER — Ambulatory Visit
Admission: RE | Admit: 2018-11-27 | Discharge: 2018-11-27 | Disposition: A | Discharge status: Home / Self Care | Payer: Medicare HMO | Source: Ambulatory Visit | Attending: Oncology | Admitting: Oncology

## 2018-11-27 ENCOUNTER — Other Ambulatory Visit: Payer: Self-pay

## 2018-11-27 DIAGNOSIS — C186 Malignant neoplasm of descending colon: Secondary | ICD-10-CM | POA: Diagnosis not present

## 2018-11-27 DIAGNOSIS — C772 Secondary and unspecified malignant neoplasm of intra-abdominal lymph nodes: Secondary | ICD-10-CM | POA: Diagnosis present

## 2018-11-27 MED ORDER — GADOBUTROL 1 MMOL/ML IV SOLN
10.0000 mL | Freq: Once | INTRAVENOUS | Status: AC | PRN
  Administered 2018-11-27: 10 mL via INTRAVENOUS

## 2018-11-28 ENCOUNTER — Encounter: Payer: Self-pay | Admitting: Oncology

## 2018-11-28 ENCOUNTER — Other Ambulatory Visit: Payer: Self-pay

## 2018-11-28 NOTE — Progress Notes (Signed)
Patient pre screened for office appointment, no questions or concerns today. 

## 2018-11-29 ENCOUNTER — Inpatient Hospital Stay: Payer: Medicare HMO | Attending: Oncology | Admitting: Oncology

## 2018-11-29 ENCOUNTER — Other Ambulatory Visit: Payer: Self-pay

## 2018-11-29 VITALS — BP 146/74 | HR 60 | Temp 98.6°F | Resp 16 | Wt 236.0 lb

## 2018-11-29 DIAGNOSIS — Z7901 Long term (current) use of anticoagulants: Secondary | ICD-10-CM | POA: Insufficient documentation

## 2018-11-29 DIAGNOSIS — C186 Malignant neoplasm of descending colon: Secondary | ICD-10-CM | POA: Insufficient documentation

## 2018-11-29 DIAGNOSIS — D649 Anemia, unspecified: Secondary | ICD-10-CM | POA: Diagnosis not present

## 2018-11-29 DIAGNOSIS — R970 Elevated carcinoembryonic antigen [CEA]: Secondary | ICD-10-CM | POA: Insufficient documentation

## 2018-11-29 DIAGNOSIS — C772 Secondary and unspecified malignant neoplasm of intra-abdominal lymph nodes: Secondary | ICD-10-CM | POA: Diagnosis not present

## 2018-11-29 DIAGNOSIS — Z86718 Personal history of other venous thrombosis and embolism: Secondary | ICD-10-CM | POA: Insufficient documentation

## 2018-11-29 DIAGNOSIS — F17210 Nicotine dependence, cigarettes, uncomplicated: Secondary | ICD-10-CM | POA: Diagnosis not present

## 2018-11-29 DIAGNOSIS — R7989 Other specified abnormal findings of blood chemistry: Secondary | ICD-10-CM | POA: Diagnosis not present

## 2018-11-29 DIAGNOSIS — N2889 Other specified disorders of kidney and ureter: Secondary | ICD-10-CM | POA: Diagnosis not present

## 2018-12-26 ENCOUNTER — Ambulatory Visit (INDEPENDENT_AMBULATORY_CARE_PROVIDER_SITE_OTHER): Payer: Medicare HMO | Admitting: Gastroenterology

## 2018-12-26 ENCOUNTER — Other Ambulatory Visit: Payer: Self-pay

## 2018-12-26 ENCOUNTER — Encounter: Payer: Self-pay | Admitting: Gastroenterology

## 2018-12-26 ENCOUNTER — Ambulatory Visit: Payer: Medicaid Other | Admitting: Gastroenterology

## 2018-12-26 VITALS — BP 144/83 | HR 66 | Temp 97.7°F | Ht 73.0 in | Wt 239.0 lb

## 2018-12-26 DIAGNOSIS — Z85038 Personal history of other malignant neoplasm of large intestine: Secondary | ICD-10-CM

## 2018-12-26 DIAGNOSIS — Z20822 Contact with and (suspected) exposure to covid-19: Secondary | ICD-10-CM

## 2018-12-26 DIAGNOSIS — D500 Iron deficiency anemia secondary to blood loss (chronic): Secondary | ICD-10-CM | POA: Diagnosis not present

## 2018-12-26 NOTE — H&P (View-Only) (Signed)
Gastroenterology Consultation  Referring Provider:     Lloyd Huger, MD Primary Care Physician:  Herminio Commons, MD Primary Gastroenterologist:  Dr. Allen Norris     Reason for Consultation:     Personal history of colon cancer and anemia        HPI:   James Keller is a 68 y.o. y/o male referred for consultation & management of personal history of colon cancer and anemia by Dr. Gwynneth Aliment, Howell Rucks, MD.  This patient comes in today after having a colon mass found in 2015 with a staging of IIIB.  The patient went through 12 cycles of chemotherapy and had a repeat imaging on November 4 of this year that did not show any recurrence.  The patient had a repeat colonoscopy 3 years ago with adenomas found and was recommended to have a repeat colonoscopy in 3 years.  The patient also was found to have a drop in his hemoglobin from 14 last year to 12 this year.  The patient was recommended by oncology to come and follow-up with me today.  His last colonoscopy was by Dr. Vicente Males in the previous colonoscopy was by me.  His original colonoscopy with the finding of the mass was by Dr. Rayann Heman. A few months ago the patient had some rectal bleeding. He has had no further rectal bleeding. He reports that he has abdominal pain.  The patient reports that his abdominal pain is located in the epigastric area.  He states that the abdominal discomfort is worse when he moves around and not related to bowel movements eating or drinking.  Past Medical History:  Diagnosis Date  . Colon cancer ([place])    a. 11/2013 s/p colon resection/Hartmann's procedure;  b. Chemo: FOLFOX completed 07/2014.  Marland Kitchen DVT (deep venous thrombosis) ([place])    RIGHT LEG/ HX OF  . DVT (deep venous thrombosis) ([place])   . Dysrhythmia   . Hypertension   . Lower extremity edema    Rt leg  . 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 PAF (paroxysmal atrial fibrillation) ([place])   . S/P chemotherapy, time since less than 4 weeks    . Tobacco abuse     Past Surgical History:  Procedure Laterality Date  . COLON RESECTION  12/19/2013   colostomy  . COLON SURGERY    . COLONOSCOPY WITH PROPOFOL N/A 09/14/2014   Procedure: COLONOSCOPY WITH PROPOFOL THROUGH COLOSTOMY AND COLONOSCOPY THRU RECTUM;  Surgeon: Lucilla Lame, MD;  Location: [facility];  Service: Endoscopy;  Laterality: N/A;  TRANSVERSE COLON POLYP AND SIGMOID COLON POLYP  . COLONOSCOPY WITH PROPOFOL N/A 11/29/2015   Procedure: COLONOSCOPY WITH PROPOFOL;  Surgeon: [name], MD;  Location: [facility];  Service: Endoscopy;  Laterality: N/A;  . COLOSTOMY REVERSAL N/A 10/28/2014   Procedure: COLOSTOMY REVERSAL;  Surgeon: Marlyce Huge, MD;  Location: [facility];  Service: General;  Laterality: N/A;  . FEMUR FRACTURE SURGERY Right   . LEG SURGERY    . LYSIS OF ADHESION  10/28/2014   Procedure: LYSIS OF ADHESION;  Surgeon: Marlyce Huge, MD;  Location: [facility];  Service: General;;  . PORT A CATH INJECTION ([place] HX) Right   . TUNNELED VENOUS PORT PLACEMENT      Prior to Admission medications   Medication Sig Start Date End Date Taking? Authorizing Provider  amiodarone (PACERONE) 200 MG tablet Take 200 mg by mouth daily.  11/11/14   [provider]  amLODipine ([place])  10 MG tablet Take 1 tablet by mouth daily. 06/29/15 11/28/18  [provider]  gabapentin (NEURONTIN) 300 MG capsule Take 1 capsule (300 mg total) by mouth 3 (three) times daily. 09/07/14   Lloyd Huger, MD  lisinopril (PRINIVIL,ZESTRIL) 20 MG tablet Take 1 tablet by mouth daily. Reported on 08/12/2015    [provider]  metoprolol tartrate (LOPRESSOR) 25 MG tablet Take 25 mg by mouth 2 (two) times daily. Am and pm 12/27/13   [provider]  rivaroxaban (XARELTO) 20 MG TABS tablet Take 20 mg by mouth daily with supper.    [provider]    Family History  Problem Relation Age of Onset  . Hypertension Mother      Social  History   Tobacco Use  . Smoking status: Current Every Day Smoker    Packs/day: 0.50    Years: 54.00    Pack years: 27.00    Types: Cigarettes  . Smokeless tobacco: Never Used  Substance Use Topics  . Alcohol use: Yes    Alcohol/week: 6.0 standard drinks    Types: 6 Cans of beer per week    Comment: moderate beer  . Drug use: Yes    Types: Marijuana    Comment: occasional    Allergies as of 12/26/2018 - Review Complete 11/28/2018  Allergen Reaction Noted  . No known allergies  04/25/2014    Review of Systems:    All systems reviewed and negative except where noted in HPI.   Physical Exam:  There were no vitals taken for this visit. No LMP for male patient. General:   Alert,  Well-developed, well-nourished, pleasant and cooperative in NAD Head:  Normocephalic and atraumatic. Eyes:  Sclera clear, no icterus.   Conjunctiva pink. Ears:  Normal auditory acuity. Neck:  Supple; no masses or thyromegaly. Lungs:  Respirations even and unlabored.  Clear throughout to auscultation.   No wheezes, crackles, or rhonchi. No acute distress. Heart:  Regular rate and rhythm; no murmurs, clicks, rubs, or gallops. Abdomen:  Normal bowel sounds.  No bruits.  Soft, non-tender and non-distended without masses, hepatosplenomegaly or hernias noted.  No guarding or rebound tenderness.  Negative Carnett sign.   Rectal:  Deferred.  Msk:  Symmetrical without gross deformities.  Good, equal movement & strength bilaterally. Pulses:  Normal pulses noted. Extremities:  No clubbing or edema.  No cyanosis. Neurologic:  Alert and oriented x3;  grossly normal neurologically. Skin:  Intact without significant lesions or rashes.  No jaundice. Lymph Nodes:  No significant cervical adenopathy. Psych:  Alert and cooperative. Normal mood and affect.  Imaging Studies: Mr Abdomen W Wo Contrast  Result Date: 11/27/2018 CLINICAL DATA:  History of colon cancer, lesion in right kidney. Follow-up from previous MRI  of 2018. EXAM: MRI ABDOMEN WITHOUT AND WITH CONTRAST TECHNIQUE: Multiplanar multisequence MR imaging of the abdomen was performed both before and after the administration of intravenous contrast. CONTRAST:  12mL GADAVIST GADOBUTROL 1 MMOL/ML IV SOLN COMPARISON:  MRI of 05/01/2016, CT abdomen and pelvis of 09/08/2015 FINDINGS: Lower chest: No signs of effusion or dense consolidation, imaging of the lower chest is limited secondary to MR technique. Hepatobiliary: No focal, suspicious hepatic lesion. Portal vein is patent. Pancreas: No mass, inflammatory changes, or other parenchymal abnormality identified. Spleen:  Within normal limits in size and appearance. Adrenals/Urinary Tract: Right adrenal adenoma similar to study of 2017. Bilateral renal cysts are present. Lesion of concern is endophytic, located in the lower pole of the right  kidney and measures 2.7 x 1.8 cm. Previously this area measured approximately 2.6 x 2.0 cm. In 2016 this area measured 2.5 by 2.0 cm. Lesion is T2 hyperintense, T1 hypointense with signs of internal septation a. Some subtle intrinsic T1 hyperintensity centrally adjacent to some of the areas of septation. Area of more profound dependent nodularity is smaller on the current study. It measures 4 mm today and measured approximately 8 mm on the prior study. Stomach/Bowel: Limited assessment of gastrointestinal tract is normal to the extent visualized. Vascular/Lymphatic: Infrarenal abdominal aortic aneurysm with eccentric soft plaque is stable at approximately 3.7 by 3.3 cm in greatest axial dimension just above the iliac bifurcation. No signs of retroperitoneal adenopathy. Renal veins are patent bilaterally. Other:  No signs of ascites. Musculoskeletal: No acute bone finding or destructive bone process. IMPRESSION: 1. Lesion in the lower pole the right kidney is within 2-3 of its measurement in 2016. Signs of focal septal thickening are suggested on subtraction images, approximately 3 mm in  thickness, suggesting Bosniak III cystic lesion. Raising the question of cystic renal cell carcinoma but with very little change over time. Urologic consultation for follow-up and/or further management if not yet performed may be helpful. 2. No evidence of metastatic disease in the abdomen. 3. Stable right adrenal adenoma. 4. Infrarenal abdominal aortic aneurysm with eccentric soft plaque measuring up to 3.7 cm. Recommend followup by ultrasound in 2 years. This recommendation follows ACR consensus guidelines: White Paper of the ACR Incidental Findings Committee II on Vascular Findings. Electronically Signed   By: Zetta Bills M.D.   On: 11/27/2018 09:40    Assessment and Plan:   James Keller is a 68 y.o. y/o male who comes in today with a history of colon cancer with subsequent colonoscopy showing polyps.  The patient has also had a 2 g drop in his hemoglobin over the last year.  The patient had a recent MRI that did not show any sign of metastatic disease of the abdomen.  The patient will be set up for a repeat colonoscopy due to his last colonoscopy showing adenomas and requesting a repeat colonoscopy in 3 years which is around now.  He will also be set up for an EGD at the same time to look for any source of his 2 g hemoglobin drop. I have discussed risks & benefits which include, but are not limited to, bleeding, infection, perforation & drug reaction.  The patient agrees with this plan & written consent will be obtained.       Lucilla Lame, MD. Marval Regal    Note: This dictation was prepared with Dragon dictation along with smaller phrase technology. Any transcriptional errors that result from this process are unintentional.

## 2018-12-26 NOTE — Progress Notes (Signed)
Gastroenterology Consultation  Referring Provider:     Lloyd Huger, MD Primary Care Physician:  Herminio Commons, MD Primary Gastroenterologist:  Dr. Allen Norris     Reason for Consultation:     Personal history of colon cancer and anemia        HPI:   James Keller is a 68 y.o. y/o male referred for consultation & management of personal history of colon cancer and anemia by Dr. Gwynneth Aliment, Howell Rucks, MD.  This patient comes in today after having a colon mass found in 2015 with a staging of IIIB.  The patient went through 12 cycles of chemotherapy and had a repeat imaging on November 4 of this year that did not show any recurrence.  The patient had a repeat colonoscopy 3 years ago with adenomas found and was recommended to have a repeat colonoscopy in 3 years.  The patient also was found to have a drop in his hemoglobin from 14 last year to 12 this year.  The patient was recommended by oncology to come and follow-up with me today.  His last colonoscopy was by Dr. Vicente Males in the previous colonoscopy was by me.  His original colonoscopy with the finding of the mass was by Dr. Rayann Heman. A few months ago the patient had some rectal bleeding. He has had no further rectal bleeding. He reports that he has abdominal pain.  The patient reports that his abdominal pain is located in the epigastric area.  He states that the abdominal discomfort is worse when he moves around and not related to bowel movements eating or drinking.  Past Medical History:  Diagnosis Date  . Colon cancer ([place])    a. 11/2013 s/p colon resection/Hartmann's procedure;  b. Chemo: FOLFOX completed 07/2014.  Marland Kitchen DVT (deep venous thrombosis) ([place])    RIGHT LEG/ HX OF  . DVT (deep venous thrombosis) ([place])   . Dysrhythmia   . Hypertension   . Lower extremity edema    Rt leg  . 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 PAF (paroxysmal atrial fibrillation) ([place])   . S/P chemotherapy, time since less than 4 weeks    . Tobacco abuse     Past Surgical History:  Procedure Laterality Date  . COLON RESECTION  12/19/2013   colostomy  . COLON SURGERY    . COLONOSCOPY WITH PROPOFOL N/A 09/14/2014   Procedure: COLONOSCOPY WITH PROPOFOL THROUGH COLOSTOMY AND COLONOSCOPY THRU RECTUM;  Surgeon: Lucilla Lame, MD;  Location: [facility];  Service: Endoscopy;  Laterality: N/A;  TRANSVERSE COLON POLYP AND SIGMOID COLON POLYP  . COLONOSCOPY WITH PROPOFOL N/A 11/29/2015   Procedure: COLONOSCOPY WITH PROPOFOL;  Surgeon: [name], MD;  Location: [facility] ENDOSCOPY;  Service: Endoscopy;  Laterality: N/A;  . COLOSTOMY REVERSAL N/A 10/28/2014   Procedure: COLOSTOMY REVERSAL;  Surgeon: Marlyce Huge, MD;  Location: [facility] ORS;  Service: General;  Laterality: N/A;  . FEMUR FRACTURE SURGERY Right   . LEG SURGERY    . LYSIS OF ADHESION  10/28/2014   Procedure: LYSIS OF ADHESION;  Surgeon: Marlyce Huge, MD;  Location: [facility] ORS;  Service: General;;  . PORT A CATH INJECTION ([place] HX) Right   . TUNNELED VENOUS PORT PLACEMENT      Prior to Admission medications   Medication Sig Start Date End Date Taking? Authorizing Provider  amiodarone (PACERONE) 200 MG tablet Take 200 mg by mouth daily.  11/11/14   [provider]  amLODipine ([place])  10 MG tablet Take 1 tablet by mouth daily. 06/29/15 11/28/18  [provider]  gabapentin (NEURONTIN) 300 MG capsule Take 1 capsule (300 mg total) by mouth 3 (three) times daily. 09/07/14   Lloyd Huger, MD  lisinopril (PRINIVIL,ZESTRIL) 20 MG tablet Take 1 tablet by mouth daily. Reported on 08/12/2015    [provider]  metoprolol tartrate (LOPRESSOR) 25 MG tablet Take 25 mg by mouth 2 (two) times daily. Am and pm 12/27/13   [provider]  rivaroxaban (XARELTO) 20 MG TABS tablet Take 20 mg by mouth daily with supper.    [provider]    Family History  Problem Relation Age of Onset  . Hypertension Mother      Social  History   Tobacco Use  . Smoking status: Current Every Day Smoker    Packs/day: 0.50    Years: 54.00    Pack years: 27.00    Types: Cigarettes  . Smokeless tobacco: Never Used  Substance Use Topics  . Alcohol use: Yes    Alcohol/week: 6.0 standard drinks    Types: 6 Cans of beer per week    Comment: moderate beer  . Drug use: Yes    Types: Marijuana    Comment: occasional    Allergies as of 12/26/2018 - Review Complete 11/28/2018  Allergen Reaction Noted  . No known allergies  04/25/2014    Review of Systems:    All systems reviewed and negative except where noted in HPI.   Physical Exam:  There were no vitals taken for this visit. No LMP for male patient. General:   Alert,  Well-developed, well-nourished, pleasant and cooperative in NAD Head:  Normocephalic and atraumatic. Eyes:  Sclera clear, no icterus.   Conjunctiva pink. Ears:  Normal auditory acuity. Neck:  Supple; no masses or thyromegaly. Lungs:  Respirations even and unlabored.  Clear throughout to auscultation.   No wheezes, crackles, or rhonchi. No acute distress. Heart:  Regular rate and rhythm; no murmurs, clicks, rubs, or gallops. Abdomen:  Normal bowel sounds.  No bruits.  Soft, non-tender and non-distended without masses, hepatosplenomegaly or hernias noted.  No guarding or rebound tenderness.  Negative Carnett sign.   Rectal:  Deferred.  Msk:  Symmetrical without gross deformities.  Good, equal movement & strength bilaterally. Pulses:  Normal pulses noted. Extremities:  No clubbing or edema.  No cyanosis. Neurologic:  Alert and oriented x3;  grossly normal neurologically. Skin:  Intact without significant lesions or rashes.  No jaundice. Lymph Nodes:  No significant cervical adenopathy. Psych:  Alert and cooperative. Normal mood and affect.  Imaging Studies: Mr Abdomen W Wo Contrast  Result Date: 11/27/2018 CLINICAL DATA:  History of colon cancer, lesion in right kidney. Follow-up from previous MRI  of 2018. EXAM: MRI ABDOMEN WITHOUT AND WITH CONTRAST TECHNIQUE: Multiplanar multisequence MR imaging of the abdomen was performed both before and after the administration of intravenous contrast. CONTRAST:  14mL GADAVIST GADOBUTROL 1 MMOL/ML IV SOLN COMPARISON:  MRI of 05/01/2016, CT abdomen and pelvis of 09/08/2015 FINDINGS: Lower chest: No signs of effusion or dense consolidation, imaging of the lower chest is limited secondary to MR technique. Hepatobiliary: No focal, suspicious hepatic lesion. Portal vein is patent. Pancreas: No mass, inflammatory changes, or other parenchymal abnormality identified. Spleen:  Within normal limits in size and appearance. Adrenals/Urinary Tract: Right adrenal adenoma similar to study of 2017. Bilateral renal cysts are present. Lesion of concern is endophytic, located in the lower pole of the right  kidney and measures 2.7 x 1.8 cm. Previously this area measured approximately 2.6 x 2.0 cm. In 2016 this area measured 2.5 by 2.0 cm. Lesion is T2 hyperintense, T1 hypointense with signs of internal septation a. Some subtle intrinsic T1 hyperintensity centrally adjacent to some of the areas of septation. Area of more profound dependent nodularity is smaller on the current study. It measures 4 mm today and measured approximately 8 mm on the prior study. Stomach/Bowel: Limited assessment of gastrointestinal tract is normal to the extent visualized. Vascular/Lymphatic: Infrarenal abdominal aortic aneurysm with eccentric soft plaque is stable at approximately 3.7 by 3.3 cm in greatest axial dimension just above the iliac bifurcation. No signs of retroperitoneal adenopathy. Renal veins are patent bilaterally. Other:  No signs of ascites. Musculoskeletal: No acute bone finding or destructive bone process. IMPRESSION: 1. Lesion in the lower pole the right kidney is within 2-3 of its measurement in 2016. Signs of focal septal thickening are suggested on subtraction images, approximately 3 mm in  thickness, suggesting Bosniak III cystic lesion. Raising the question of cystic renal cell carcinoma but with very little change over time. Urologic consultation for follow-up and/or further management if not yet performed may be helpful. 2. No evidence of metastatic disease in the abdomen. 3. Stable right adrenal adenoma. 4. Infrarenal abdominal aortic aneurysm with eccentric soft plaque measuring up to 3.7 cm. Recommend followup by ultrasound in 2 years. This recommendation follows ACR consensus guidelines: White Paper of the ACR Incidental Findings Committee II on Vascular Findings. Electronically Signed   By: Zetta Bills M.D.   On: 11/27/2018 09:40    Assessment and Plan:   James Keller is a 68 y.o. y/o male who comes in today with a history of colon cancer with subsequent colonoscopy showing polyps.  The patient has also had a 2 g drop in his hemoglobin over the last year.  The patient had a recent MRI that did not show any sign of metastatic disease of the abdomen.  The patient will be set up for a repeat colonoscopy due to his last colonoscopy showing adenomas and requesting a repeat colonoscopy in 3 years which is around now.  He will also be set up for an EGD at the same time to look for any source of his 2 g hemoglobin drop. I have discussed risks & benefits which include, but are not limited to, bleeding, infection, perforation & drug reaction.  The patient agrees with this plan & written consent will be obtained.       Lucilla Lame, MD. Marval Regal    Note: This dictation was prepared with Dragon dictation along with smaller phrase technology. Any transcriptional errors that result from this process are unintentional.

## 2018-12-27 ENCOUNTER — Other Ambulatory Visit: Payer: Self-pay

## 2018-12-30 LAB — NOVEL CORONAVIRUS, NAA: SARS-CoV-2, NAA: NOT DETECTED

## 2019-01-01 ENCOUNTER — Telehealth: Payer: Self-pay | Admitting: *Deleted

## 2019-01-01 NOTE — Telephone Encounter (Signed)
Patient called ,given negative covid results.

## 2019-01-02 ENCOUNTER — Other Ambulatory Visit: Payer: Self-pay

## 2019-01-15 ENCOUNTER — Other Ambulatory Visit
Admission: RE | Admit: 2019-01-15 | Discharge: 2019-01-15 | Disposition: A | Discharge status: Home / Self Care | Payer: Medicare HMO | Source: Ambulatory Visit | Attending: Gastroenterology | Admitting: Gastroenterology

## 2019-01-15 ENCOUNTER — Other Ambulatory Visit: Payer: Self-pay

## 2019-01-15 DIAGNOSIS — Z20828 Contact with and (suspected) exposure to other viral communicable diseases: Secondary | ICD-10-CM | POA: Insufficient documentation

## 2019-01-15 DIAGNOSIS — Z01812 Encounter for preprocedural laboratory examination: Secondary | ICD-10-CM | POA: Insufficient documentation

## 2019-01-15 LAB — SARS CORONAVIRUS 2 (TAT 6-24 HRS): SARS Coronavirus 2: NEGATIVE

## 2019-01-16 ENCOUNTER — Other Ambulatory Visit: Payer: Medicare HMO

## 2019-01-20 ENCOUNTER — Encounter: Payer: Self-pay | Admitting: Gastroenterology

## 2019-01-21 ENCOUNTER — Ambulatory Visit: Payer: Medicare HMO | Admitting: Anesthesiology

## 2019-01-21 ENCOUNTER — Other Ambulatory Visit: Payer: Self-pay

## 2019-01-21 ENCOUNTER — Encounter: Payer: Self-pay | Admitting: Gastroenterology

## 2019-01-21 ENCOUNTER — Encounter
Admission: RE | Disposition: A | Discharge status: Home / Self Care | Payer: Self-pay | Source: Home / Self Care | Attending: Gastroenterology

## 2019-01-21 ENCOUNTER — Ambulatory Visit
Admission: RE | Admit: 2019-01-21 | Discharge: 2019-01-21 | Disposition: A | Discharge status: Home / Self Care | Payer: Medicare HMO | Attending: Gastroenterology | Admitting: Gastroenterology

## 2019-01-21 DIAGNOSIS — Z85038 Personal history of other malignant neoplasm of large intestine: Secondary | ICD-10-CM | POA: Diagnosis not present

## 2019-01-21 DIAGNOSIS — K640 First degree hemorrhoids: Secondary | ICD-10-CM | POA: Insufficient documentation

## 2019-01-21 DIAGNOSIS — Z9221 Personal history of antineoplastic chemotherapy: Secondary | ICD-10-CM | POA: Insufficient documentation

## 2019-01-21 DIAGNOSIS — Z79899 Other long term (current) drug therapy: Secondary | ICD-10-CM | POA: Diagnosis not present

## 2019-01-21 DIAGNOSIS — D122 Benign neoplasm of ascending colon: Secondary | ICD-10-CM | POA: Insufficient documentation

## 2019-01-21 DIAGNOSIS — F17210 Nicotine dependence, cigarettes, uncomplicated: Secondary | ICD-10-CM | POA: Insufficient documentation

## 2019-01-21 DIAGNOSIS — B9681 Helicobacter pylori [H. pylori] as the cause of diseases classified elsewhere: Secondary | ICD-10-CM | POA: Diagnosis not present

## 2019-01-21 DIAGNOSIS — D500 Iron deficiency anemia secondary to blood loss (chronic): Secondary | ICD-10-CM | POA: Diagnosis not present

## 2019-01-21 DIAGNOSIS — Z86718 Personal history of other venous thrombosis and embolism: Secondary | ICD-10-CM | POA: Insufficient documentation

## 2019-01-21 DIAGNOSIS — I10 Essential (primary) hypertension: Secondary | ICD-10-CM | POA: Diagnosis not present

## 2019-01-21 DIAGNOSIS — K2980 Duodenitis without bleeding: Secondary | ICD-10-CM | POA: Diagnosis not present

## 2019-01-21 DIAGNOSIS — I480 Paroxysmal atrial fibrillation: Secondary | ICD-10-CM | POA: Insufficient documentation

## 2019-01-21 DIAGNOSIS — D509 Iron deficiency anemia, unspecified: Secondary | ICD-10-CM | POA: Insufficient documentation

## 2019-01-21 DIAGNOSIS — D123 Benign neoplasm of transverse colon: Secondary | ICD-10-CM | POA: Diagnosis not present

## 2019-01-21 DIAGNOSIS — Z7901 Long term (current) use of anticoagulants: Secondary | ICD-10-CM | POA: Insufficient documentation

## 2019-01-21 DIAGNOSIS — K449 Diaphragmatic hernia without obstruction or gangrene: Secondary | ICD-10-CM | POA: Insufficient documentation

## 2019-01-21 DIAGNOSIS — K2900 Acute gastritis without bleeding: Secondary | ICD-10-CM | POA: Diagnosis not present

## 2019-01-21 DIAGNOSIS — K635 Polyp of colon: Secondary | ICD-10-CM | POA: Diagnosis not present

## 2019-01-21 DIAGNOSIS — K2970 Gastritis, unspecified, without bleeding: Secondary | ICD-10-CM | POA: Insufficient documentation

## 2019-01-21 HISTORY — PX: COLONOSCOPY WITH PROPOFOL: SHX5780

## 2019-01-21 HISTORY — PX: ESOPHAGOGASTRODUODENOSCOPY (EGD) WITH PROPOFOL: SHX5813

## 2019-01-21 SURGERY — ESOPHAGOGASTRODUODENOSCOPY (EGD) WITH PROPOFOL
Anesthesia: General

## 2019-01-21 MED ORDER — SODIUM CHLORIDE 0.9 % IV SOLN: INTRAVENOUS | Status: DC

## 2019-01-21 MED ORDER — LIDOCAINE HCL (CARDIAC) PF 100 MG/5ML IV SOSY
PREFILLED_SYRINGE | INTRAVENOUS | Status: DC | PRN
  Administered 2019-01-21: 100 mg via INTRAVENOUS

## 2019-01-21 MED ORDER — PHENYLEPHRINE HCL (PRESSORS) 10 MG/ML IV SOLN
INTRAVENOUS | Status: DC | PRN
  Administered 2019-01-21 (×2): 100 ug via INTRAVENOUS

## 2019-01-21 MED ORDER — GLYCOPYRROLATE 0.2 MG/ML IJ SOLN
INTRAMUSCULAR | Status: DC | PRN
  Administered 2019-01-21: .2 mg via INTRAVENOUS

## 2019-01-21 MED ORDER — PROPOFOL 500 MG/50ML IV EMUL
INTRAVENOUS | Status: DC | PRN
  Administered 2019-01-21: 140 ug/kg/min via INTRAVENOUS

## 2019-01-21 MED ORDER — PROPOFOL 10 MG/ML IV BOLUS
INTRAVENOUS | Status: DC | PRN
  Administered 2019-01-21: 70 mg via INTRAVENOUS

## 2019-01-21 MED ORDER — HEPARIN SOD (PORK) LOCK FLUSH 100 UNIT/ML IV SOLN
INTRAVENOUS | Status: AC
  Filled 2019-01-21: qty 5

## 2019-01-21 NOTE — Op Note (Signed)
[facility] Gastroenterology Patient Name: James Keller Procedure Date: 01/21/2019 8:44 AM MRN: [id number] Account #: [id number] Date of Birth: 1950-10-10 Admit Type: Outpatient Age: 68 Room: [facility] ENDO ROOM 4 Gender: Male Note Status: Finalized Procedure:             Upper GI endoscopy Indications:           Iron deficiency anemia Providers:             Lucilla Lame MD, MD Referring MD:          No Local Md, MD (Referring MD) Medicines:             Propofol per Anesthesia Complications:         No immediate complications. Procedure:             Pre-Anesthesia Assessment:                        - Prior to the procedure, a History and Physical was                         performed, and patient medications and allergies were                         reviewed. The patient's tolerance of previous                         anesthesia was also reviewed. The risks and benefits                         of the procedure and the sedation options and risks                         were discussed with the patient. All questions were                         answered, and informed consent was obtained. Prior                         Anticoagulants: The patient has taken Eliquis                         (apixaban), last dose was 5 days prior to procedure.                         ASA Grade Assessment: II - A patient with mild                         systemic disease. After reviewing the risks and                         benefits, the patient was deemed in satisfactory                         condition to undergo the procedure.                        After obtaining informed consent, the endoscope was  passed under direct vision. Throughout the procedure,                         the patient's blood pressure, pulse, and oxygen                         saturations were monitored continuously. The Endoscope                         was introduced through the mouth,  and advanced to the                         second part of duodenum. The upper GI endoscopy was                         accomplished without difficulty. The patient tolerated                         the procedure well. Findings:      A small hiatal hernia was present.      Diffuse mild inflammation characterized by erythema was found in the       gastric antrum. Biopsies were taken with a cold forceps for histology.      Moderate inflammation characterized by erythema was found in the       duodenal bulb. Biopsies were taken with a cold forceps for histology. Impression:            - Small hiatal hernia.                        - Gastritis. Biopsied.                        - Duodenitis. Biopsied. Recommendation:        - Discharge patient to home.                        - Resume previous diet.                        - Continue present medications.                        - Await pathology results.                        - Perform a colonoscopy today. Procedure Code(s):     --- Professional ---                        [phone], Esophagogastroduodenoscopy, flexible,                         transoral; with biopsy, single or multiple Diagnosis Code(s):     --- Professional ---                        D50.9, Iron deficiency anemia, unspecified                        K29.80, Duodenitis without bleeding  K29.70, Gastritis, unspecified, without bleeding CPT copyright 2019 American Medical Association. All rights reserved. The codes documented in this report are preliminary and upon coder review may  be revised to meet current compliance requirements. Lucilla Lame MD, MD 01/21/2019 8:56:57 AM This report has been signed electronically. Number of Addenda: 0 Note Initiated On: 01/21/2019 8:44 AM Estimated Blood Loss:  Estimated blood loss: none.      [facility]

## 2019-01-21 NOTE — Op Note (Signed)
[facility] Gastroenterology Patient Name: James Keller Procedure Date: 01/21/2019 8:43 AM MRN: [id number] Account #: [id number] Date of Birth: 1950/05/24 Admit Type: Outpatient Age: 68 Room: [facility] ENDO ROOM 4 Gender: Male Note Status: Finalized Procedure:             Colonoscopy Indications:           Iron deficiency anemia Providers:             Lucilla Lame MD, MD Referring MD:          No Local Md, MD (Referring MD) Medicines:             Propofol per Anesthesia Complications:         No immediate complications. Procedure:             Pre-Anesthesia Assessment:                        - Prior to the procedure, a History and Physical was                         performed, and patient medications and allergies were                         reviewed. The patient's tolerance of previous                         anesthesia was also reviewed. The risks and benefits                         of the procedure and the sedation options and risks                         were discussed with the patient. All questions were                         answered, and informed consent was obtained. Prior                         Anticoagulants: The patient has taken no previous                         anticoagulant or antiplatelet agents. ASA Grade                         Assessment: II - A patient with mild systemic disease.                         After reviewing the risks and benefits, the patient                         was deemed in satisfactory condition to undergo the                         procedure.                        After obtaining informed consent, the colonoscope was  passed under direct vision. Throughout the procedure,                         the patient's blood pressure, pulse, and oxygen                         saturations were monitored continuously. The                         Colonoscope was introduced through the anus and             advanced to the the cecum, identified by appendiceal                         orifice and ileocecal valve. The colonoscopy was                         performed without difficulty. The patient tolerated                         the procedure well. The quality of the bowel                         preparation was excellent. Findings:      The perianal and digital rectal examinations were normal.      A 4 mm polyp was found in the ascending colon. The polyp was sessile.       The polyp was removed with a cold biopsy forceps. Resection and       retrieval were complete.      Two sessile polyps were found in the transverse colon. The polyps were 3       to 4 mm in size. These polyps were removed with a cold biopsy forceps.       Resection and retrieval were complete.      There was evidence of a prior end-to-side colo-colonic anastomosis in       the sigmoid colon. This was patent.      Non-bleeding internal hemorrhoids were found during retroflexion. The       hemorrhoids were Grade I (internal hemorrhoids that do not prolapse). Impression:            - One 4 mm polyp in the ascending colon, removed with                         a cold biopsy forceps. Resected and retrieved.                        - Two 3 to 4 mm polyps in the transverse colon,                         removed with a cold biopsy forceps. Resected and                         retrieved.                        - Patent end-to-side colo-colonic anastomosis.                        - Non-bleeding  internal hemorrhoids. Recommendation:        - Discharge patient to home.                        - Resume previous diet.                        - Continue present medications.                        - Await pathology results.                        - Repeat colonoscopy in 5 years for surveillance.                        - Return to GI clinic in 1 month. Procedure Code(s):     --- Professional ---                        [phone],  Colonoscopy, flexible; with biopsy, single or                         multiple Diagnosis Code(s):     --- Professional ---                        D50.9, Iron deficiency anemia, unspecified                        K63.5, Polyp of colon CPT copyright 2019 American Medical Association. All rights reserved. The codes documented in this report are preliminary and upon coder review may  be revised to meet current compliance requirements. Lucilla Lame MD, MD 01/21/2019 9:11:17 AM This report has been signed electronically. Number of Addenda: 0 Note Initiated On: 01/21/2019 8:43 AM Scope Withdrawal Time: 0 hours 6 minutes 51 seconds  Total Procedure Duration: 0 hours 9 minutes 21 seconds  Estimated Blood Loss:  Estimated blood loss: none.      [facility]

## 2019-01-21 NOTE — OR Nursing (Signed)
[place]. POSITIVE BLOOD RETURN.PT TOLERATED PROCEDURE WELL

## 2019-01-21 NOTE — Anesthesia Postprocedure Evaluation (Signed)
Anesthesia Post Note  Patient: James Keller  Procedure(s) Performed: ESOPHAGOGASTRODUODENOSCOPY (EGD) WITH PROPOFOL (N/A ) COLONOSCOPY WITH PROPOFOL (N/A )  Patient location during evaluation: Endoscopy Anesthesia Type: General Level of consciousness: awake and alert Pain management: pain level controlled Vital Signs Assessment: post-procedure vital signs reviewed and stable Respiratory status: spontaneous breathing, nonlabored ventilation, respiratory function stable and patient connected to nasal cannula oxygen Cardiovascular status: blood pressure returned to baseline and stable Postop Assessment: no apparent nausea or vomiting Anesthetic complications: no     Last Vitals:  Vitals:   01/21/19 0911 01/21/19 0921  BP: 113/78 94/68  Pulse: 96   Resp: 17   Temp: (!) 35.7 C   SpO2: (!) 86%     Last Pain:  Vitals:   01/21/19 0941  TempSrc:   PainSc: 0-No pain                 Martha Clan

## 2019-01-21 NOTE — Interval H&P Note (Signed)
History and Physical Interval Note:  01/21/2019 7:43 AM  James Keller  has presented today for surgery, with the diagnosis of ANEMIA D64.9 HX OF COLON CANCER z85.038.  The various methods of treatment have been discussed with the patient and family. After consideration of risks, benefits and other options for treatment, the patient has consented to  Procedure(s): ESOPHAGOGASTRODUODENOSCOPY (EGD) WITH PROPOFOL (N/A) COLONOSCOPY WITH PROPOFOL (N/A) as a surgical intervention.  The patient's history has been reviewed, patient examined, no change in status, stable for surgery.  I have reviewed the patient's chart and labs.  Questions were answered to the patient's satisfaction.     James Keller [employer]

## 2019-01-21 NOTE — Anesthesia Post-op Follow-up Note (Signed)
Anesthesia QCDR form completed.        

## 2019-01-21 NOTE — Transfer of Care (Signed)
Immediate Anesthesia Transfer of Care Note  Patient: James Keller  Procedure(s) Performed: ESOPHAGOGASTRODUODENOSCOPY (EGD) WITH PROPOFOL (N/A ) COLONOSCOPY WITH PROPOFOL (N/A )  Patient Location: PACU  Anesthesia Type:General  Level of Consciousness: sedated  Airway & Oxygen Therapy: Patient Spontanous Breathing and Patient connected to face mask oxygen  Post-op Assessment: Report given to RN and Post -op Vital signs reviewed and stable  Post vital signs: Reviewed and stable  Last Vitals:  Vitals Value Taken Time  BP    Temp    Pulse    Resp    SpO2      Last Pain:  Vitals:   01/21/19 0757  TempSrc: Temporal         Complications: No apparent anesthesia complications

## 2019-01-21 NOTE — Anesthesia Preprocedure Evaluation (Signed)
Anesthesia Evaluation  Patient identified by MRN, date of birth, ID band Patient awake    Reviewed: Allergy & Precautions, NPO status , Patient's Chart, lab work & pertinent test results, reviewed documented beta blocker date and time   History of Anesthesia Complications Negative for: history of anesthetic complications  Airway Mallampati: II  TM Distance: >3 FB Neck ROM: Full    Dental  (+) Dental Advidsory Given, Edentulous Upper, Edentulous Lower   Pulmonary neg shortness of breath, neg sleep apnea, neg COPD, neg recent URI, Current Smoker and Patient abstained from smoking.,    breath sounds clear to auscultation- rhonchi (-) wheezing      Cardiovascular hypertension, Pt. on medications and Pt. on home beta blockers (-) angina(-) CAD, (-) Past MI and (-) Cardiac Stents + dysrhythmias Atrial Fibrillation (-) Valvular Problems/Murmurs Rhythm:Regular Rate:Normal - Systolic murmurs and - Diastolic murmurs Echo [id number]: NORMAL LEFT VENTRICULAR SYSTOLIC FUNCTION WITH AN ESTIMATED EF = 55 % NORMAL RIGHT VENTRICULAR SYSTOLIC FUNCTION MILD-TO-MODERATE TRICUSPID VALVE INSUFFICIENCY MILD MITRAL VALVE INSUFFICIENCY NO VALVULAR STENOSIS MILD BIATRIAL ENLARGEMENT MILD CONCENTRIC LEFT VENTRICULAR HYPERTROPHY MILDLY DILATED AORTIC ROOT   Neuro/Psych negative psych ROS   GI/Hepatic negative GI ROS, Neg liver ROS,   Endo/Other  negative endocrine ROSneg diabetes  Renal/GU negative Renal ROS     Musculoskeletal negative musculoskeletal ROS (+)   Abdominal (+) + obese,   Peds  Hematology negative hematology ROS (+)   Anesthesia Other Findings Past Medical History: No date: Colon cancer ([place])     Comment: a. 11/2013 s/p colon resection/Hartmann's               procedure;  b. Chemo: FOLFOX completed 07/2014. No date: DVT (deep venous thrombosis) ([facility])     Comment: RIGHT LEG/ HX OF No date: DVT (deep venous thrombosis) ([facility]) No  date: Dysrhythmia No date: Hypertension No date: Lower extremity edema     Comment: Rt leg No date: Neuromuscular disorder ([facility])     Comment: TINGLING IN FINGERS and feet No date: PAF (paroxysmal atrial fibrillation) ([facility]) No date: S/P chemotherapy, time since less than 4 weeks No date: Tobacco abuse   Reproductive/Obstetrics                             Anesthesia Physical  Anesthesia Plan  ASA: III  Anesthesia Plan: General   Post-op Pain Management:    Induction: Intravenous  PONV Risk Score and Plan: 1 and Propofol infusion and TIVA  Airway Management Planned: Natural Airway and Nasal Cannula  Additional Equipment:   Intra-op Plan:   Post-operative Plan:   Informed Consent: I have reviewed the patients History and Physical, chart, labs and discussed the procedure including the risks, benefits and alternatives for the proposed anesthesia with the patient or authorized representative who has indicated his/her understanding and acceptance.     Dental advisory given  Plan Discussed with: CRNA and Anesthesiologist  Anesthesia Plan Comments:         Anesthesia Quick Evaluation

## 2019-01-22 ENCOUNTER — Encounter: Payer: Self-pay | Admitting: *Deleted

## 2019-01-22 LAB — SURGICAL PATHOLOGY

## 2019-01-23 ENCOUNTER — Encounter: Payer: Self-pay | Admitting: Gastroenterology

## 2019-05-24 NOTE — Progress Notes (Signed)
[place]  [phone]) [phone] [phone]) [phone]  ID: James Keller OB: Dec 18, 1950  MR#: [id number]  [id number]  Patient Care Team: Center, [facility] as PCP - General Lucilla Lame, MD as Consulting Physician (Gastroenterology) Lloyd Huger, MD as Consulting Physician (Oncology)  CHIEF COMPLAINT: Stage IIIB adenocarcinoma of the desending colon, right kidney mass.  INTERVAL HISTORY: Patient returns to clinic today for routine 62-month evaluation.  He has occasional constipation, but otherwise feels well.  He has no neurologic complaints. He denies any fevers or illnesses.  He has a good appetite and denies weight loss.  He denies any chest pain, shortness of breath, cough, or hemoptysis.  He denies any nausea, vomiting, or diarrhea.  He has no changes in his bowel movements.  He denies any melena or hematochezia.  He has no urinary complaints.  Patient offers no specific complaints today.  REVIEW OF SYSTEMS:   Review of Systems  Constitutional: Negative.  Negative for fever, malaise/fatigue and weight loss.  Respiratory: Negative.  Negative for cough, hemoptysis and shortness of breath.   Cardiovascular: Negative.  Negative for chest pain and leg swelling.  Gastrointestinal: Positive for constipation. Negative for abdominal pain, blood in stool and melena.  Genitourinary: Negative.  Negative for frequency, hematuria and urgency.  Musculoskeletal: Negative.  Negative for back pain.  Skin: Negative.  Negative for rash.  Neurological: Negative.  Negative for dizziness, sensory change, focal weakness, weakness and headaches.  Psychiatric/Behavioral: Negative.  The patient is not nervous/anxious.     As per HPI. Otherwise, a complete review of systems is negative.  PAST MEDICAL HISTORY: Past Medical History:  Diagnosis Date  . Colon cancer ([place])    a. 11/2013 s/p colon resection/Hartmann's procedure;  b. Chemo: FOLFOX completed  07/2014.  Marland Kitchen DVT (deep venous thrombosis) ([place])    RIGHT LEG/ HX OF  . DVT (deep venous thrombosis) ([place])   . Dysrhythmia   . Hypertension   . Lower extremity edema    Rt leg  . 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 PAF (paroxysmal atrial fibrillation) ([place])   . S/P chemotherapy, time since less than 4 weeks   . Tobacco abuse     PAST SURGICAL HISTORY: Past Surgical History:  Procedure Laterality Date  . COLON RESECTION  12/19/2013   colostomy  . COLON SURGERY    . COLONOSCOPY WITH PROPOFOL N/A 09/14/2014   Procedure: COLONOSCOPY WITH PROPOFOL THROUGH COLOSTOMY AND COLONOSCOPY THRU RECTUM;  Surgeon: Lucilla Lame, MD;  Location: [place];  Service: Endoscopy;  Laterality: N/A;  TRANSVERSE COLON POLYP AND SIGMOID COLON POLYP  . COLONOSCOPY WITH PROPOFOL N/A 11/29/2015   Procedure: COLONOSCOPY WITH PROPOFOL;  Surgeon: [name], MD;  Location: [facility] ENDOSCOPY;  Service: Endoscopy;  Laterality: N/A;  . COLONOSCOPY WITH PROPOFOL N/A 01/21/2019   Procedure: COLONOSCOPY WITH PROPOFOL;  Surgeon: Lucilla Lame, MD;  Location: [facility] ENDOSCOPY;  Service: Endoscopy;  Laterality: N/A;  . COLOSTOMY REVERSAL N/A 10/28/2014   Procedure: COLOSTOMY REVERSAL;  Surgeon: Marlyce Huge, MD;  Location: [facility] ORS;  Service: General;  Laterality: N/A;  . ESOPHAGOGASTRODUODENOSCOPY (EGD) WITH PROPOFOL N/A 01/21/2019   Procedure: ESOPHAGOGASTRODUODENOSCOPY (EGD) WITH PROPOFOL;  Surgeon: Lucilla Lame, MD;  Location: [facility] ENDOSCOPY;  Service: Endoscopy;  Laterality: N/A;  . FEMUR FRACTURE SURGERY Right   . FRACTURE SURGERY    . LEG SURGERY    . LYSIS OF ADHESION  10/28/2014   Procedure: LYSIS OF ADHESION;  Surgeon: Harrell Gave  Lundquist, MD;  Location: [facility] ORS;  Service: General;;  . PORT A CATH INJECTION ([place] HX) Right   . TUNNELED VENOUS PORT PLACEMENT      FAMILY HISTORY: Grandmother with "liver cancer".     ADVANCED DIRECTIVES:    HEALTH MAINTENANCE: Social  History   Tobacco Use  . Smoking status: Current Every Day Smoker    Packs/day: 0.50    Years: 54.00    Pack years: 27.00    Types: Cigarettes  . Smokeless tobacco: Never Used  Substance Use Topics  . Alcohol use: Yes    Alcohol/week: 6.0 standard drinks    Types: 6 Cans of beer per week    Comment: moderate beer  . Drug use: Yes    Types: Marijuana    Comment: occasional     Colonoscopy:  PAP:  Bone density:  Lipid panel:  Allergies  Allergen Reactions  . No Known Allergies     Current Outpatient Medications  Medication Sig Dispense Refill  . amiodarone (PACERONE) 200 MG tablet Take 200 mg by mouth daily.     Marland Kitchen amLODipine (NORVASC) 10 MG tablet Take 1 tablet by mouth daily.    Marland Kitchen gabapentin (NEURONTIN) 300 MG capsule Take 1 capsule (300 mg total) by mouth 3 (three) times daily. 90 capsule 1  . lisinopril (PRINIVIL,ZESTRIL) 20 MG tablet Take 1 tablet by mouth daily. Reported on 08/12/2015    . metoprolol tartrate (LOPRESSOR) 25 MG tablet Take 25 mg by mouth 2 (two) times daily. Am and pm    . rivaroxaban (XARELTO) 20 MG TABS tablet Take 20 mg by mouth daily with supper.     No current facility-administered medications for this visit.   Facility-Administered Medications Ordered in Other Visits  Medication Dose Route Frequency Provider Last Rate Last Admin  . heparin lock flush 100 unit/mL  500 Units Intracatheter PRN Lloyd Huger, MD      . sodium chloride 0.9 % injection 10 mL  10 mL Intracatheter PRN Lloyd Huger, MD   10 mL at 10/19/14 1353  . sodium chloride flush (NS) 0.9 % injection 10 mL  10 mL Intracatheter PRN Lloyd Huger, MD      . sodium chloride flush (NS) 0.9 % injection 10 mL  10 mL Intravenous PRN Lloyd Huger, MD   10 mL at 08/13/17 1442    OBJECTIVE: Vitals:   05/29/19 1422  BP: 139/76  Pulse: (!) 57  Resp: 20  Temp: (!) 96 F (35.6 C)  SpO2: 95%     Body mass index is 31.07 kg/m.    ECOG FS:0 -  Asymptomatic  General: Well-developed, well-nourished, no acute distress. Eyes: Pink conjunctiva, anicteric sclera. HEENT: Normocephalic, moist mucous membranes. Lungs: No audible wheezing or coughing. Heart: Regular rate and rhythm. Abdomen: Soft, nontender, no obvious distention. Musculoskeletal: No edema, cyanosis, or clubbing. Neuro: Alert, answering all questions appropriately. Cranial nerves grossly intact. Skin: No rashes or petechiae noted. Psych: Normal affect.   LAB RESULTS:  Lab Results  Component Value Date   NA 139 05/29/2019   K 4.4 05/29/2019   CL 106 05/29/2019   CO2 26 05/29/2019   GLUCOSE 92 05/29/2019   BUN 30 (H) 05/29/2019   CREATININE 1.78 (H) 05/29/2019   CALCIUM 9.0 05/29/2019   PROT 6.9 05/29/2019   ALBUMIN 4.0 05/29/2019   AST 20 05/29/2019   ALT 26 05/29/2019   ALKPHOS 58 05/29/2019   BILITOT 1.3 (H) 05/29/2019   GFRNONAA 39 (L)  05/29/2019   GFRAA 45 (L) 05/29/2019    Lab Results  Component Value Date   WBC 4.8 05/29/2019   NEUTROABS 2.6 05/29/2019   HGB 13.7 05/29/2019   HCT 41.4 05/29/2019   MCV 88.8 05/29/2019   PLT 165 05/29/2019    STUDIES:    No results found.  ASSESSMENT: Stage IIIB adenocarcinoma of the desending colon, DVT, right kidney mass.  PLAN:    1. Stage IIIB adenocarcinoma of the desending colon: Patient completed 12 cycles of FOLFOX on July 29, 2014.  MRI results from November 27, 2018 reviewed independently with no obvious evidence of recurrent or metastatic disease.  Patient CEA remains mildly elevated at 6.8, but this is improved from previous.  Today's result is pending.  He had a colonoscopy and upper endoscopy on January 21, 2019 that did not reveal any significant pathology or evidence of recurrent disease.  No further intervention is needed.  Return to clinic in 1 year with repeat laboratory work and further evaluation.  If patient continues to have no evidence of disease, he likely can be discharged from  clinic.   2. H/o DVT: Patient has discontinued Coumadin and is now on Xarelto. 3. Elevated creatinine: Patient's creatinine has trended up slightly from his baseline of 1.77.  Monitor.   4. Neuropathy: Patient does not complain of this today.   5. Right kidney mass: MRI results as above.  No changes.  Consider repeat imaging in 1 year.  6.  Anemia: Resolved. 7.  Port: Will send referral for port removal in the next 1 to 2 weeks.  Patient expressed understanding and was in agreement with this plan. He also understands that He can call clinic at any time with any questions, concerns, or complaints.   Colon cancer   Staging form: Colon/Rectum, AJCC 7th Edition     Pathologic stage from 05/04/2014: Stage IIIB (T4, N1, M0) - Signed by Lloyd Huger, MD on 05/04/2014   Lloyd Huger, MD   05/29/2019 6:58 PM

## 2019-05-28 ENCOUNTER — Other Ambulatory Visit: Payer: Self-pay | Admitting: Emergency Medicine

## 2019-05-28 DIAGNOSIS — C772 Secondary and unspecified malignant neoplasm of intra-abdominal lymph nodes: Secondary | ICD-10-CM

## 2019-05-28 DIAGNOSIS — C186 Malignant neoplasm of descending colon: Secondary | ICD-10-CM

## 2019-05-29 ENCOUNTER — Inpatient Hospital Stay (HOSPITAL_BASED_OUTPATIENT_CLINIC_OR_DEPARTMENT_OTHER): Payer: Medicare HMO | Admitting: Oncology

## 2019-05-29 ENCOUNTER — Encounter: Payer: Self-pay | Admitting: Oncology

## 2019-05-29 ENCOUNTER — Other Ambulatory Visit: Payer: Self-pay

## 2019-05-29 ENCOUNTER — Inpatient Hospital Stay: Payer: Medicare HMO | Attending: Oncology

## 2019-05-29 VITALS — BP 139/76 | HR 57 | Temp 96.0°F | Resp 20 | Wt 235.5 lb

## 2019-05-29 DIAGNOSIS — C186 Malignant neoplasm of descending colon: Secondary | ICD-10-CM | POA: Insufficient documentation

## 2019-05-29 DIAGNOSIS — R7989 Other specified abnormal findings of blood chemistry: Secondary | ICD-10-CM | POA: Insufficient documentation

## 2019-05-29 DIAGNOSIS — Z7901 Long term (current) use of anticoagulants: Secondary | ICD-10-CM | POA: Diagnosis not present

## 2019-05-29 DIAGNOSIS — N2889 Other specified disorders of kidney and ureter: Secondary | ICD-10-CM | POA: Diagnosis not present

## 2019-05-29 DIAGNOSIS — F17210 Nicotine dependence, cigarettes, uncomplicated: Secondary | ICD-10-CM | POA: Insufficient documentation

## 2019-05-29 DIAGNOSIS — Z95828 Presence of other vascular implants and grafts: Secondary | ICD-10-CM

## 2019-05-29 DIAGNOSIS — Z86718 Personal history of other venous thrombosis and embolism: Secondary | ICD-10-CM | POA: Insufficient documentation

## 2019-05-29 DIAGNOSIS — R970 Elevated carcinoembryonic antigen [CEA]: Secondary | ICD-10-CM | POA: Insufficient documentation

## 2019-05-29 DIAGNOSIS — C772 Secondary and unspecified malignant neoplasm of intra-abdominal lymph nodes: Secondary | ICD-10-CM | POA: Diagnosis not present

## 2019-05-29 DIAGNOSIS — K5900 Constipation, unspecified: Secondary | ICD-10-CM | POA: Diagnosis not present

## 2019-05-29 LAB — COMPREHENSIVE METABOLIC PANEL
ALT: 26 U/L (ref 0–44)
AST: 20 U/L (ref 15–41)
Albumin: 4 g/dL (ref 3.5–5.0)
Alkaline Phosphatase: 58 U/L (ref 38–126)
Anion gap: 7 (ref 5–15)
BUN: 30 mg/dL — ABNORMAL HIGH (ref 8–23)
CO2: 26 mmol/L (ref 22–32)
Calcium: 9 mg/dL (ref 8.9–10.3)
Chloride: 106 mmol/L (ref 98–111)
Creatinine, Ser: 1.78 mg/dL — ABNORMAL HIGH (ref 0.61–1.24)
GFR calc Af Amer: 45 mL/min — ABNORMAL LOW (ref >60)
GFR calc non Af Amer: 39 mL/min — ABNORMAL LOW (ref >60)
Glucose, Bld: 92 mg/dL (ref 70–99)
Potassium: 4.4 mmol/L (ref 3.5–5.1)
Sodium: 139 mmol/L (ref 135–145)
Total Bilirubin: 1.3 mg/dL — ABNORMAL HIGH (ref 0.3–1.2)
Total Protein: 6.9 g/dL (ref 6.5–8.1)

## 2019-05-29 LAB — CBC WITH DIFFERENTIAL/PLATELET
Abs Immature Granulocytes: 0.01 10*3/uL (ref 0.00–0.07)
Basophils Absolute: 0 10*3/uL (ref 0.0–0.1)
Basophils Relative: 1 %
Eosinophils Absolute: 0.2 10*3/uL (ref 0.0–0.5)
Eosinophils Relative: 4 %
HCT: 41.4 % (ref 39.0–52.0)
Hemoglobin: 13.7 g/dL (ref 13.0–17.0)
Immature Granulocytes: 0 %
Lymphocytes Relative: 30 %
Lymphs Abs: 1.5 10*3/uL (ref 0.7–4.0)
MCH: 29.4 pg (ref 26.0–34.0)
MCHC: 33.1 g/dL (ref 30.0–36.0)
MCV: 88.8 fL (ref 80.0–100.0)
Monocytes Absolute: 0.5 10*3/uL (ref 0.1–1.0)
Monocytes Relative: 11 %
Neutro Abs: 2.6 10*3/uL (ref 1.7–7.7)
Neutrophils Relative %: 54 %
Platelets: 165 10*3/uL (ref 150–400)
RBC: 4.66 MIL/uL (ref 4.22–5.81)
RDW: 14.7 % (ref 11.5–15.5)
WBC: 4.8 10*3/uL (ref 4.0–10.5)
nRBC: 0 % (ref 0.0–0.2)

## 2019-05-29 NOTE — Progress Notes (Signed)
Patient complains of constipation. States started about a week ago and doesn't feel medication he is using is helping.

## 2019-05-30 LAB — CEA: CEA: 6.4 ng/mL — ABNORMAL HIGH (ref 0.0–4.7)

## 2019-06-10 ENCOUNTER — Other Ambulatory Visit: Payer: Self-pay

## 2019-06-10 ENCOUNTER — Ambulatory Visit (INDEPENDENT_AMBULATORY_CARE_PROVIDER_SITE_OTHER): Payer: Medicare HMO | Admitting: Surgery

## 2019-06-10 ENCOUNTER — Encounter: Payer: Self-pay | Admitting: Surgery

## 2019-06-10 VITALS — BP 159/90 | HR 60 | Temp 96.1°F | Ht 73.0 in | Wt 232.4 lb

## 2019-06-10 DIAGNOSIS — Z95828 Presence of other vascular implants and grafts: Secondary | ICD-10-CM | POA: Diagnosis not present

## 2019-06-10 NOTE — Patient Instructions (Addendum)
Dr.Rodenberg removed patient Port A Cath at today's visit. Patient will follow up in two weeks 06/24/19 at 2:45pm with Dr.Rodenberg. Patient may resume with showering tomorrow, but do NOT submerge the area in a pool(s) of water. Patient may take Tylenol or Ibuprofen to help with the discomfort or pain.  Patient may also use cold compress (ice pack) wrapped in a cloth or gauze, but not directly on the wound.   Wound Care, Adult Taking care of your wound properly can help to prevent pain, infection, and scarring. It can also help your wound to heal more quickly. How to care for your wound Wound care      Follow instructions from your health care provider about how to take care of your wound. Make sure you: ? Wash your hands with soap and water before you change the bandage (dressing). If soap and water are not available, use hand sanitizer. ? Change your dressing as told by your health care provider. ? Leave stitches (sutures), skin glue, or adhesive strips in place. These skin closures may need to stay in place for 2 weeks or longer. If adhesive strip edges start to loosen and curl up, you may trim the loose edges. Do not remove adhesive strips completely unless your health care provider tells you to do that.  Check your wound area every day for signs of infection. Check for: ? Redness, swelling, or pain. ? Fluid or blood. ? Warmth. ? Pus or a bad smell.  Ask your health care provider if you should clean the wound with mild soap and water. Doing this may include: ? Using a clean towel to pat the wound dry after cleaning it. Do not rub or scrub the wound. ? Applying a cream or ointment. Do this only as told by your health care provider. ? Covering the incision with a clean dressing.  Ask your health care provider when you can leave the wound uncovered.  Keep the dressing dry until your health care provider says it can be removed. Do not take baths, swim, use a hot tub, or do anything  that would put the wound underwater until your health care provider approves. Ask your health care provider if you can take showers. You may only be allowed to take sponge baths. Medicines   If you were prescribed an antibiotic medicine, cream, or ointment, take or use the antibiotic as told by your health care provider. Do not stop taking or using the antibiotic even if your condition improves.  Take over-the-counter and prescription medicines only as told by your health care provider. If you were prescribed pain medicine, take it 30 or more minutes before you do any wound care or as told by your health care provider. General instructions  Return to your normal activities as told by your health care provider. Ask your health care provider what activities are safe.  Do not scratch or pick at the wound.  Do not use any products that contain nicotine or tobacco, such as cigarettes and e-cigarettes. These may delay wound healing. If you need help quitting, ask your health care provider.  Keep all follow-up visits as told by your health care provider. This is important.  Eat a diet that includes protein, vitamin A, vitamin C, and other nutrient-rich foods to help the wound heal. ? Foods rich in protein include meat, dairy, beans, nuts, and other sources. ? Foods rich in vitamin A include carrots and dark green, leafy vegetables. ? Foods rich in vitamin C  include citrus, tomatoes, and other fruits and vegetables. ? Nutrient-rich foods have protein, carbohydrates, fat, vitamins, or minerals. Eat a variety of healthy foods including vegetables, fruits, and whole grains. Contact a health care provider if:  You received a tetanus shot and you have swelling, severe pain, redness, or bleeding at the injection site.  Your pain is not controlled with medicine.  You have redness, swelling, or pain around the wound.  You have fluid or blood coming from the wound.  Your wound feels warm to the  touch.  You have pus or a bad smell coming from the wound.  You have a fever or chills.  You are nauseous or you vomit.  You are dizzy. Get help right away if:  You have a red streak going away from your wound.  The edges of the wound open up and separate.  Your wound is bleeding, and the bleeding does not stop with gentle pressure.  You have a rash.  You faint.  You have trouble breathing. Summary  Always wash your hands with soap and water before changing your bandage (dressing).  To help with healing, eat foods that are rich in protein, vitamin A, vitamin C, and other nutrients.  Check your wound every day for signs of infection. Contact your health care provider if you suspect that your wound is infected. This information is not intended to replace advice given to you by your health care provider. Make sure you discuss any questions you have with your health care provider. Document Revised: 04/29/2018 Document Reviewed: 07/27/2015 Elsevier Patient Education  [place].

## 2019-06-10 NOTE — Progress Notes (Signed)
SURGICAL OPERATIVE REPORT  DATE OF PROCEDURE: 06/10/2019  ATTENDING  ANESTHESIA: Local  PRE-OPERATIVE DIAGNOSIS: Tunneled Right subclavian central venous catheter with subcutaneous port, no longer needed (icd-10's: Z95.828  POST-OPERATIVE DIAGNOSIS:  Same  PROCEDURE(S):  1.) Removal of indwelling right subclavian central venous catheter with subcutaneous port (cpt: 36590)  INTRAOPERATIVE FINDINGS: Well-incorporated tunneled Right subclavian central venous catheter with subcutaneous port without erythema or purulence  INTRAVENOUS FLUIDS: 0 mL crystalloid   ESTIMATED BLOOD LOSS: Minimal (< 20 mL)  URINE OUTPUT: No foley   SPECIMENS: No Specimen  IMPLANTS: None  DRAINS: none  COMPLICATIONS: None apparent  CONDITION AT END OF PROCEDURE: Hemodynamically stable and awake  DISPOSITION OF PATIENT: Home.  INDICATIONS FOR PROCEDURE:  Patient is a 69 y.o. male s/p complete utilization of a right subclavian central venous catheter with indwelling subcutaneous port presents for evaluation and removal of his no longer needed port. Patient reports the port was never infected and always functioned properly, but since it's reportedly no longer needed, he wishes to have it removed. All risks, benefits, and alternatives to above procedure were discussed with the patient, all of patient's questions were answered to his expressed satisfaction, and informed consent was obtained and documented.  DETAILS OF PROCEDURE: Patient was brought to the procedure suite and appropriately identified. In supine position, operative site was prepped and draped in the usual sterile fashion, and following a brief time out, lidocaine was injected in the subcutaneous tissue overlying the port, a 2 cm incision was made through the previous scar to accommodate the port. A combination of blunt dissection, sharp dissection  were used to free the port and attached catheter from its surrounding capsule and tissues. The port  was then able to be removed from its subcutaneous pocket, the catheter was confirmed to slide freely, and the catheter was removed with immediate focal pressure applied to the venous insertion site for hemostasis. Hemostasis was then confirmed, in the subcutaneous pocket two fine Prolene sutures were removed, and the wound was then closed in layers, using 3-0 Vicryl buried interrupted suture to re-approximate dermis.A running subcuticular 4-0 Monocryl suture was used to re-approximate epidermis. Skin was then cleaned, dried, and sterile Dermabond was applied and allowed to dry.  Ronny Bacon, M.D., [facility]  06/10/2019 ; 5:10 PM

## 2019-06-24 ENCOUNTER — Ambulatory Visit (INDEPENDENT_AMBULATORY_CARE_PROVIDER_SITE_OTHER): Payer: Self-pay | Admitting: Surgery

## 2019-06-24 ENCOUNTER — Other Ambulatory Visit: Payer: Self-pay

## 2019-06-24 ENCOUNTER — Encounter: Payer: Self-pay | Admitting: Surgery

## 2019-06-24 VITALS — BP 153/88 | HR 72 | Temp 97.9°F | Ht 73.0 in | Wt 234.8 lb

## 2019-06-24 DIAGNOSIS — Z95828 Presence of other vascular implants and grafts: Secondary | ICD-10-CM

## 2019-06-24 NOTE — Progress Notes (Signed)
[facility] POST-OP OFFICE VISIT  06/24/2019  HPI: James Keller is a 69 y.o. male 14 days s/p s/p PAC removal.  C/o only itching at site.   Vital signs: BP (!) 153/88   Pulse 72   Temp 97.9 F (36.6 C) (Temporal)   Ht 6\' 1"  (1.854 m)   Wt 234 lb 12.8 oz (106.5 kg)   SpO2 98%   BMI 30.98 kg/m    Physical Exam: Constitutional: looks well.  Skin: incision is C,D, and intact, with dermabond a bit gummy, and coming off.   Assessment/Plan: This is a 69 y.o. male 14 days s/p PAC removal.   Patient Active Problem List   Diagnosis Date Noted  . Iron deficiency anemia due to chronic blood loss   . Acute gastritis without hemorrhage   . Polyp of transverse colon   . Right kidney mass 05/08/2016  . Incisional hernia, without obstruction or gangrene 12/27/2015  . Benign neoplasm of cecum   . Benign neoplasm of ascending colon   . Adenomatous polyp of sigmoid colon   . Cancer of descending colon metastatic to intra-abdominal lymph node ([place]) 07/28/2015  . Atrial fibrillation with rapid ventricular response ([place]) 11/03/2014  . Hypertensive left ventricular hypertrophy 10/21/2014  . Paroxysmal atrial fibrillation ([place]) 10/21/2014  . Breathlessness on exertion 10/21/2014  . Personal history of colon cancer   . Benign neoplasm of sigmoid colon   . Benign neoplasm of transverse colon   . DVT (deep venous thrombosis) ([place])   . Bradycardia 02/02/2014  . A-fib ([place]) 01/30/2014  . Essential (primary) hypertension 01/30/2014    - f/u PRN.    Ronny Bacon M.D., FACS 06/24/2019, 12:00 PM

## 2019-06-24 NOTE — Patient Instructions (Addendum)
Dr.Rodenberg advised patient he may apply pressure to area to clean it.  Follow-up with our office as needed. Please call and ask to speak with a nurse if you develop questions or concerns.   GENERAL POST-OPERATIVE PATIENT INSTRUCTIONS   FOLLOW-UP:  Please make an appointment with your physician in.  Call your physician immediately if you have any fevers greater than 102.5, drainage from you wound that is not clear or looks infected, persistent bleeding, increasing abdominal pain, problems urinating, or persistent nausea/vomiting.    WOUND CARE INSTRUCTIONS:  Keep a dry clean dressing on the wound if there is drainage. The initial bandage may be removed after 24 hours.  Once the wound has quit draining you may leave it open to air.  If clothing rubs against the wound or causes irritation and the wound is not draining you may cover it with a dry dressing during the daytime.  Try to keep the wound dry and avoid ointments on the wound unless directed to do so.  If the wound becomes bright red and painful or starts to drain infected material that is not clear, please contact your physician immediately.  If the wound is mildly pink and has a thick firm ridge underneath it, this is normal, and is referred to as a healing ridge.  This will resolve over the next 4-6 weeks.  DIET:  You may eat any foods that you can tolerate.  It is a good idea to eat a high fiber diet and take in plenty of fluids to prevent constipation.  If you do become constipated you may want to take a mild laxative or take ducolax tablets on a daily basis until your bowel habits are regular.  Constipation can be very uncomfortable, along with straining, after recent surgery.  ACTIVITY:  You are encouraged to cough and deep breath or use your incentive spirometer if you were given one, every 15-30 minutes when awake.  This will help prevent respiratory complications and low grade fevers post-operatively if you had a general anesthetic.   You may want to hug a pillow when coughing and sneezing to add additional support to the surgical area, if you had abdominal or chest surgery, which will decrease pain during these times.  You are encouraged to walk and engage in light activity for the next two weeks.  You should not lift more than 20 pounds during this time frame as it could put you at increased risk for complications.  Twenty pounds is roughly equivalent to a plastic bag of groceries.    MEDICATIONS:  Try to take narcotic medications and anti-inflammatory medications, such as tylenol, ibuprofen, naprosyn, etc., with food.  This will minimize stomach upset from the medication.  Should you develop nausea and vomiting from the pain medication, or develop a rash, please discontinue the medication and contact your physician.  You should not drive, make important decisions, or operate machinery when taking narcotic pain medication.  QUESTIONS:  Please feel free to call your physician or the hospital operator if you have any questions, and they will be glad to assist you.

## 2020-01-06 ENCOUNTER — Emergency Department: Payer: Medicare HMO

## 2020-01-06 ENCOUNTER — Other Ambulatory Visit: Payer: Self-pay

## 2020-01-06 ENCOUNTER — Emergency Department
Admission: EM | Admit: 2020-01-06 | Discharge: 2020-01-06 | Disposition: A | Discharge status: Home / Self Care | Payer: Medicare HMO | Attending: Emergency Medicine | Admitting: Emergency Medicine

## 2020-01-06 DIAGNOSIS — R2990 Unspecified symptoms and signs involving the nervous system: Secondary | ICD-10-CM | POA: Diagnosis not present

## 2020-01-06 DIAGNOSIS — R519 Headache, unspecified: Secondary | ICD-10-CM | POA: Diagnosis not present

## 2020-01-06 DIAGNOSIS — Z7901 Long term (current) use of anticoagulants: Secondary | ICD-10-CM | POA: Insufficient documentation

## 2020-01-06 DIAGNOSIS — E860 Dehydration: Secondary | ICD-10-CM | POA: Diagnosis not present

## 2020-01-06 DIAGNOSIS — I480 Paroxysmal atrial fibrillation: Secondary | ICD-10-CM | POA: Diagnosis not present

## 2020-01-06 DIAGNOSIS — I639 Cerebral infarction, unspecified: Secondary | ICD-10-CM | POA: Diagnosis not present

## 2020-01-06 DIAGNOSIS — Z20822 Contact with and (suspected) exposure to covid-19: Secondary | ICD-10-CM | POA: Diagnosis not present

## 2020-01-06 DIAGNOSIS — F1590 Other stimulant use, unspecified, uncomplicated: Secondary | ICD-10-CM | POA: Insufficient documentation

## 2020-01-06 DIAGNOSIS — Z85038 Personal history of other malignant neoplasm of large intestine: Secondary | ICD-10-CM | POA: Diagnosis not present

## 2020-01-06 DIAGNOSIS — R42 Dizziness and giddiness: Secondary | ICD-10-CM | POA: Diagnosis present

## 2020-01-06 DIAGNOSIS — F17210 Nicotine dependence, cigarettes, uncomplicated: Secondary | ICD-10-CM | POA: Insufficient documentation

## 2020-01-06 DIAGNOSIS — I10 Essential (primary) hypertension: Secondary | ICD-10-CM | POA: Insufficient documentation

## 2020-01-06 DIAGNOSIS — Z79899 Other long term (current) drug therapy: Secondary | ICD-10-CM | POA: Insufficient documentation

## 2020-01-06 LAB — COMPREHENSIVE METABOLIC PANEL
ALT: 22 U/L (ref 0–44)
AST: 19 U/L (ref 15–41)
Albumin: 4.4 g/dL (ref 3.5–5.0)
Alkaline Phosphatase: 64 U/L (ref 38–126)
Anion gap: 10 (ref 5–15)
BUN: 31 mg/dL — ABNORMAL HIGH (ref 8–23)
CO2: 26 mmol/L (ref 22–32)
Calcium: 9.9 mg/dL (ref 8.9–10.3)
Chloride: 102 mmol/L (ref 98–111)
Creatinine, Ser: 2.16 mg/dL — ABNORMAL HIGH (ref 0.61–1.24)
GFR, Estimated: 33 mL/min — ABNORMAL LOW (ref >60)
Glucose, Bld: 92 mg/dL (ref 70–99)
Potassium: 4.9 mmol/L (ref 3.5–5.1)
Sodium: 138 mmol/L (ref 135–145)
Total Bilirubin: 1.4 mg/dL — ABNORMAL HIGH (ref 0.3–1.2)
Total Protein: 7.5 g/dL (ref 6.5–8.1)

## 2020-01-06 LAB — CBC
HCT: 46.1 % (ref 39.0–52.0)
Hemoglobin: 15.4 g/dL (ref 13.0–17.0)
MCH: 29.2 pg (ref 26.0–34.0)
MCHC: 33.4 g/dL (ref 30.0–36.0)
MCV: 87.3 fL (ref 80.0–100.0)
Platelets: 198 10*3/uL (ref 150–400)
RBC: 5.28 MIL/uL (ref 4.22–5.81)
RDW: 14.5 % (ref 11.5–15.5)
WBC: 5.8 10*3/uL (ref 4.0–10.5)
nRBC: 0 % (ref 0.0–0.2)

## 2020-01-06 LAB — DIFFERENTIAL
Abs Immature Granulocytes: 0.03 10*3/uL (ref 0.00–0.07)
Basophils Absolute: 0 10*3/uL (ref 0.0–0.1)
Basophils Relative: 1 %
Eosinophils Absolute: 0.2 10*3/uL (ref 0.0–0.5)
Eosinophils Relative: 4 %
Immature Granulocytes: 1 %
Lymphocytes Relative: 20 %
Lymphs Abs: 1.2 10*3/uL (ref 0.7–4.0)
Monocytes Absolute: 0.5 10*3/uL (ref 0.1–1.0)
Monocytes Relative: 9 %
Neutro Abs: 3.8 10*3/uL (ref 1.7–7.7)
Neutrophils Relative %: 65 %

## 2020-01-06 LAB — RESP PANEL BY RT-PCR (FLU A&B, COVID) ARPGX2
Influenza A by PCR: NEGATIVE
Influenza B by PCR: NEGATIVE
SARS Coronavirus 2 by RT PCR: NEGATIVE

## 2020-01-06 LAB — PROTIME-INR
INR: 2.5 — ABNORMAL HIGH (ref 0.8–1.2)
Prothrombin Time: 26.4 seconds — ABNORMAL HIGH (ref 11.4–15.2)

## 2020-01-06 LAB — CBG MONITORING, ED: Glucose-Capillary: 98 mg/dL (ref 70–99)

## 2020-01-06 LAB — APTT: aPTT: 40 seconds — ABNORMAL HIGH (ref 24–36)

## 2020-01-06 MED ORDER — SODIUM CHLORIDE 0.9% FLUSH: 3.0000 mL | Freq: Once | INTRAVENOUS | Status: DC

## 2020-01-06 MED ORDER — SODIUM CHLORIDE 0.9 % IV BOLUS
500.0000 mL | Freq: Once | INTRAVENOUS | Status: AC
  Administered 2020-01-06: 500 mL via INTRAVENOUS

## 2020-01-06 NOTE — Discharge Instructions (Addendum)
I would recommend calling Neurology (Dr. Melrose Nakayama or Dr. Manuella Ghazi) for follow-up, ideally in the next week  I would also call to discuss with your Cardiologist, to see if you might need an echocardiogram or further monitoring, such as a Holter monitor, for possible heart rhythm issues.

## 2020-01-06 NOTE — Progress Notes (Signed)
CODE STROKE- PHARMACY COMMUNICATION  Time CODE STROKE page received:11:26a  Time response to CODE STROKE was made in person: 11:30a  Time Stroke Kit retrieved from [facility] (only if needed): n/a; non tPA candidate d/t Xarelto use PTA.  Name of Provider contacted: Dr. Rory Percy  Past Medical History:  Diagnosis Date  . Colon cancer ([place])    a. 11/2013 s/p colon resection/Hartmann's procedure;  b. Chemo: FOLFOX completed 07/2014.  Marland Kitchen DVT (deep venous thrombosis) ([place])    RIGHT LEG/ HX OF  . DVT (deep venous thrombosis) ([place])   . Dysrhythmia   . Hypertension   . Lower extremity edema    Rt leg  . 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 PAF (paroxysmal atrial fibrillation) ([place])   . S/P chemotherapy, time since less than 4 weeks   . Tobacco abuse    Prior to Admission medications   Medication Sig Start Date End Date Taking? Authorizing Provider  amiodarone (PACERONE) 200 MG tablet Take 200 mg by mouth daily.  11/11/14   [provider]  amLODipine (NORVASC) 10 MG tablet Take 1 tablet by mouth daily. 06/29/15 05/29/19  [provider]  gabapentin (NEURONTIN) 300 MG capsule Take 1 capsule (300 mg total) by mouth 3 (three) times daily. 09/07/14   Lloyd Huger, MD  lisinopril (PRINIVIL,ZESTRIL) 20 MG tablet Take 1 tablet by mouth daily. Reported on 08/12/2015    [provider]  lisinopril (ZESTRIL) 40 MG tablet Take 40 mg by mouth daily. 06/11/19   [provider]  metoprolol tartrate (LOPRESSOR) 25 MG tablet Take 25 mg by mouth 2 (two) times daily. Am and pm 12/27/13   [provider]  rivaroxaban (XARELTO) 20 MG TABS tablet Take 20 mg by mouth daily with supper.    [provider]    Lorna Dibble ,PharmD Clinical Pharmacist  01/06/2020  3:00 PM

## 2020-01-06 NOTE — ED Notes (Signed)
Code  stroke  called  to 333

## 2020-01-06 NOTE — ED Triage Notes (Signed)
Pt reports driving down the road and experienced sudden onset of blurred vision and vision loss, pt stopped the vehicle, reports severe headache, dizziness, and left arm tingling Pt states that his vision returned, denies tingling, dizziness, or headache at this time States it started at 0900

## 2020-01-06 NOTE — Consult Note (Addendum)
Neurology Consultation  Reason for Consult: Code stroke-vision loss and dizziness Referring Physician: Dr. Duffy Bruce  CC: Vision loss, dizziness  History is obtained from: Patient, chart  HPI: James Keller is a 69 y.o. male past medical history of colon cancer status post resection, DVT and atrial fibrillation on Xarelto, neuropathy, tobacco abuse, sent to the emergency room for evaluation of sudden onset of dizziness and vision loss. He was driving and suddenly noticed that he could not see out of both eyes.  This lasted for a very short duration of time before returning back to normal.  He came to the emergency room irrespective to be evaluated and at the triage reported some numbness on his left side, but that also has nearly resolved.  He does have some known neuropathy and has tingling and numbness symptoms persistent from that but this was somewhat different than his usual symptoms. No unilateral weakness.  No headaches.  No facial droop that he reported. Evaluated at the ED by the ED provider.  Being on Xarelto precluded him from getting TPA-last dose of Xarelto was this morning but a code stroke was activated since he had sudden onset of focal neurological deficits within the last few hours.   LKW: 9 AM 01/06/2020 tpa given?: no, NIH 0, on Xarelto Premorbid modified Rankin scale (mRS):0   ROS: Performed and negative except as noted in HPI  Past Medical History:  Diagnosis Date  . Colon cancer ([place])    a. 11/2013 s/p colon resection/Hartmann's procedure;  b. Chemo: FOLFOX completed 07/2014.  Marland Kitchen DVT (deep venous thrombosis) ([place])    RIGHT LEG/ HX OF  . DVT (deep venous thrombosis) ([place])   . Dysrhythmia   . Hypertension   . Lower extremity edema    Rt leg  . 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 PAF (paroxysmal atrial fibrillation) ([place])   . S/P chemotherapy, time since less than 4 weeks   . Tobacco abuse     Family History  Problem  Relation Age of Onset  . Hypertension Mother    Social History:   reports that he has been smoking cigarettes. He has a 27.00 pack-year smoking history. He has never used smokeless tobacco. He reports current alcohol use of about 6.0 standard drinks of alcohol per week. He reports current drug use. Drug: Marijuana. Medications  Current Facility-Administered Medications:  .  sodium chloride flush (NS) 0.9 % injection 3 mL, 3 mL, Intravenous, Once, Duffy Bruce, MD  Current Outpatient Medications:  .  amiodarone (PACERONE) 200 MG tablet, Take 200 mg by mouth daily. , Disp: , Rfl:  .  amLODipine (NORVASC) 10 MG tablet, Take 1 tablet by mouth daily., Disp: , Rfl:  .  gabapentin (NEURONTIN) 300 MG capsule, Take 1 capsule (300 mg total) by mouth 3 (three) times daily., Disp: 90 capsule, Rfl: 1 .  lisinopril (PRINIVIL,ZESTRIL) 20 MG tablet, Take 1 tablet by mouth daily. Reported on 08/12/2015, Disp: , Rfl:  .  lisinopril (ZESTRIL) 40 MG tablet, Take 40 mg by mouth daily., Disp: , Rfl:  .  metoprolol tartrate (LOPRESSOR) 25 MG tablet, Take 25 mg by mouth 2 (two) times daily. Am and pm, Disp: , Rfl:  .  rivaroxaban (XARELTO) 20 MG TABS tablet, Take 20 mg by mouth daily with supper., Disp: , Rfl:   Facility-Administered Medications Ordered in Other Encounters:  .  heparin lock flush 100 unit/mL, 500 Units, Intracatheter, PRN, Grayland Ormond, Kathlene November, MD .  sodium chloride 0.9 % injection 10 mL, 10 mL, Intracatheter, PRN, Lloyd Huger, MD, 10 mL at 10/19/14 1353 .  sodium chloride flush (NS) 0.9 % injection 10 mL, 10 mL, Intracatheter, PRN, Grayland Ormond, Kathlene November, MD .  sodium chloride flush (NS) 0.9 % injection 10 mL, 10 mL, Intravenous, PRN, Lloyd Huger, MD, 10 mL at 08/13/17 1442   Exam: Current vital signs: BP 120/75 (BP Location: Left Arm)   Pulse 64   Temp 97.9 F (36.6 C) (Oral)   Resp 18   Ht 6\' 1"  (1.854 m)   Wt 107 kg   SpO2 98%   BMI 31.14 kg/m  Vital signs in last 24  hours: Temp:  [97.9 F (36.6 C)] 97.9 F (36.6 C) (12/14 1041) Pulse Rate:  [64] 64 (12/14 1041) Resp:  [18] 18 (12/14 1041) BP: (120)/(75) 120/75 (12/14 1041) SpO2:  [98 %] 98 % (12/14 1041) Weight:  [107 kg] 107 kg (12/14 1041) GENERAL: Awake, alert in NAD HEENT: - Normocephalic and atraumatic, dry mm, no LN++, no Thyromegally LUNGS - Clear to auscultation bilaterally with no wheezes CV - S1S2 RRR, no m/r/g, equal pulses bilaterally. ABDOMEN - Soft, nontender, nondistended with normoactive BS  NEURO:  Mental Status: AA&Ox3 Language: speech is mildly dysarthric but that is likely due to him being edentulous.  Naming, repetition, fluency, and comprehension intact. Cranial Nerves: PERRL. EOMI, visual fields full, no facial asymmetry facial sensation intact, hearing intact, tongue/uvula/soft palate midline, normal sternocleidomastoid and trapezius muscle strength. No evidence of tongue atrophy or fibrillations Motor: 5/5 with no vertical drift. Tone: is normal and bulk is normal Sensation- Intact to light touch bilaterall Coordination: FTN intact bilaterally Gait- deferre  NIHSS-0   Labs I have reviewed labs in epic and the results pertinent to this consultation are:   CBC    Component Value Date/Time   WBC 5.8 01/06/2020 1124   RBC 5.28 01/06/2020 1124   HGB 15.4 01/06/2020 1124   HGB 13.4 05/12/2014 0856   HCT 46.1 01/06/2020 1124   HCT 41.0 05/12/2014 0856   PLT 198 01/06/2020 1124   PLT 148 (L) 05/12/2014 0856   MCV 87.3 01/06/2020 1124   MCV 86 05/12/2014 0856   MCH 29.2 01/06/2020 1124   MCHC 33.4 01/06/2020 1124   RDW 14.5 01/06/2020 1124   RDW 19.8 (H) 05/12/2014 0856   LYMPHSABS 1.2 01/06/2020 1124   LYMPHSABS 1.8 05/12/2014 0856   MONOABS 0.5 01/06/2020 1124   MONOABS 0.5 05/12/2014 0856   EOSABS 0.2 01/06/2020 1124   EOSABS 0.1 05/12/2014 0856   BASOSABS 0.0 01/06/2020 1124   BASOSABS 0.1 05/12/2014 0856    CMP     Component Value Date/Time   NA  138 01/06/2020 1124   NA 139 05/12/2014 0856   K 4.9 01/06/2020 1124   K 3.3 (L) 05/12/2014 0856   CL 102 01/06/2020 1124   CL 105 05/12/2014 0856   CO2 26 01/06/2020 1124   CO2 26 05/12/2014 0856   GLUCOSE 92 01/06/2020 1124   GLUCOSE 143 (H) 05/12/2014 0856   BUN 31 (H) 01/06/2020 1124   BUN 18 05/12/2014 0856   CREATININE 2.16 (H) 01/06/2020 1124   CREATININE 1.58 (H) 05/12/2014 0856   CALCIUM 9.9 01/06/2020 1124   CALCIUM 9.2 05/12/2014 0856   PROT 7.5 01/06/2020 1124   PROT 7.2 05/12/2014 0856   ALBUMIN 4.4 01/06/2020 1124   ALBUMIN 4.1 05/12/2014 0856   AST 19 01/06/2020 1124   AST 29  05/12/2014 0856   ALT 22 01/06/2020 1124   ALT 24 05/12/2014 0856   ALKPHOS 64 01/06/2020 1124   ALKPHOS 94 05/12/2014 0856   BILITOT 1.4 (H) 01/06/2020 1124   BILITOT 0.6 05/12/2014 0856   GFRNONAA 33 (L) 01/06/2020 1124   GFRNONAA 46 (L) 05/12/2014 0856   GFRAA 45 (L) 05/29/2019 1352   GFRAA 54 (L) 05/12/2014 0856    Lipid Panel     Component Value Date/Time   TRIG 125 11/05/2014 0415     Imaging I have reviewed the images obtained:  CT-scan of the brain-no acute changes.  No bleed.  Assessment:  69 year old man with past medical history of paroxysmal atrial fibrillation, DVT on anticoagulation with Xarelto, history of colon cancer presenting with transient headache dizziness along with left-sided tingling and bilateral visual loss that lasted transiently for a few seconds. Current exam NIH stroke scale 0-also on Xarelto making TPA administration not possible. Exam not consistent with LVO hence not a candidate for EVT. Current presentation could be TIA. It would be prudent to rule out an MRI.  He is otherwise anticoagulated and unless there is a new stroke, no further work-up might be warranted at this time  Impression: Transient headache, dizziness, vision loss in both eyes along with left arm tingling-symptoms resolved Evaluate for TIA versus stroke Paroxysmal atrial  fibrillation, DVT-on Xarelto  Recommendations: Stat MRI-admit for stroke work-up if positive for stroke otherwise do not see need for pursuing any further work-up from a neurological standpoint as an inpatient. Can follow-up with outpatient neurology and cardiology if the MRI is negative. Discussed my plan with Dr. Ellender Hose. I will follow up on the imaging.    -- Amie Portland, MD [facility] Pager: [phone] If 7pm to 7am, please call on call as listed on AMION.   Addendum MRI brain without contrast negative for acute stroke. Scattered dilated endovascular spaces and chronic microvascular ischemic disease. No bleed. Creatinine abnormal, slightly worse than baseline but not too far off. Discussed with EDP, who will recommend adequate hydration, repeat labs and follow-up outpatient with his primary care doctor in a week.  Continue anticoagulation.  No further inpatient work-up recommendations.  Follow-up with outpatient neurology and cardiology as well as primary care as recommended by ED provider.  Discussed with Dr. Ellender Hose.  -- Amie Portland, MD [facility] Pager: [phone] If 7pm to 7am, please call on call as listed on AMION.

## 2020-01-06 NOTE — ED Provider Notes (Signed)
[facility] Emergency Department Provider Note  ____________________________________________   Event Date/Time   First MD Initiated Contact with Patient 01/06/20 1100     (approximate)  I have reviewed the triage vital signs and the nursing notes.   HISTORY  Chief Complaint Loss of Vision, Dizziness, Headache, and Code Stroke    HPI James Keller is a 69 y.o. male  With PMHx pAFib, DVT, on anticoagulation, here with transient headache, dizziness, left arm tingling. Pt was in usual state of health until around 9 AM this morning. He was driving when he experienced acute onset of blurred vision. He then felt like he was dizzy/off balance, and had some tingling in his left arm. Reports he stopped his car, then drove to the side once his sx improved. Reports that since then, he feels back to his baseline. No ongoing blurred vision or headache. No LLE or RUE or RLE sx. He has not tried to ambulate. No other complaints. No recent med changes and he has been taking meds as prescribed.        Past Medical History:  Diagnosis Date  . Colon cancer ([place])    a. 11/2013 s/p colon resection/Hartmann's procedure;  b. Chemo: FOLFOX completed 07/2014.  Marland Kitchen DVT (deep venous thrombosis) ([place])    RIGHT LEG/ HX OF  . DVT (deep venous thrombosis) ([place])   . Dysrhythmia   . Hypertension   . Lower extremity edema    Rt leg  . 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 PAF (paroxysmal atrial fibrillation) ([place])   . S/P chemotherapy, time since less than 4 weeks   . Tobacco abuse     Patient Active Problem List   Diagnosis Date Noted  . Iron deficiency anemia due to chronic blood loss   . Acute gastritis without hemorrhage   . Polyp of transverse colon   . Right kidney mass 05/08/2016  . Incisional hernia, without obstruction or gangrene 12/27/2015  . Benign neoplasm of cecum   . Benign neoplasm of ascending colon   . Adenomatous polyp of sigmoid  colon   . Cancer of descending colon metastatic to intra-abdominal lymph node ([place]) 07/28/2015  . Atrial fibrillation with rapid ventricular response ([place]) 11/03/2014  . Hypertensive left ventricular hypertrophy 10/21/2014  . Paroxysmal atrial fibrillation ([place]) 10/21/2014  . Breathlessness on exertion 10/21/2014  . Personal history of colon cancer   . Benign neoplasm of sigmoid colon   . Benign neoplasm of transverse colon   . DVT (deep venous thrombosis) ([place])   . Bradycardia 02/02/2014  . A-fib ([place]) 01/30/2014  . Essential (primary) hypertension 01/30/2014    Past Surgical History:  Procedure Laterality Date  . COLON RESECTION  12/19/2013   colostomy  . COLON SURGERY    . COLONOSCOPY WITH PROPOFOL N/A 09/14/2014   Procedure: COLONOSCOPY WITH PROPOFOL THROUGH COLOSTOMY AND COLONOSCOPY THRU RECTUM;  Surgeon: Lucilla Lame, MD;  Location: [facility];  Service: Endoscopy;  Laterality: N/A;  TRANSVERSE COLON POLYP AND SIGMOID COLON POLYP  . COLONOSCOPY WITH PROPOFOL N/A 11/29/2015   Procedure: COLONOSCOPY WITH PROPOFOL;  Surgeon: [name], MD;  Location: [facility] ENDOSCOPY;  Service: Endoscopy;  Laterality: N/A;  . COLONOSCOPY WITH PROPOFOL N/A 01/21/2019   Procedure: COLONOSCOPY WITH PROPOFOL;  Surgeon: Lucilla Lame, MD;  Location: [facility] ENDOSCOPY;  Service: Endoscopy;  Laterality: N/A;  . COLOSTOMY REVERSAL N/A 10/28/2014   Procedure: COLOSTOMY REVERSAL;  Surgeon: Marlyce Huge, MD;  Location: [facility]  ORS;  Service: General;  Laterality: N/A;  . ESOPHAGOGASTRODUODENOSCOPY (EGD) WITH PROPOFOL N/A 01/21/2019   Procedure: ESOPHAGOGASTRODUODENOSCOPY (EGD) WITH PROPOFOL;  Surgeon: Lucilla Lame, MD;  Location: [facility] ENDOSCOPY;  Service: Endoscopy;  Laterality: N/A;  . FEMUR FRACTURE SURGERY Right   . FRACTURE SURGERY    . LEG SURGERY    . LYSIS OF ADHESION  10/28/2014   Procedure: LYSIS OF ADHESION;  Surgeon: Marlyce Huge, MD;  Location: [facility] ORS;  Service: General;;  .  PORT A CATH INJECTION ([place] HX) Right   . TUNNELED VENOUS PORT PLACEMENT      Prior to Admission medications   Medication Sig Start Date End Date Taking? Authorizing Provider  amiodarone (PACERONE) 200 MG tablet Take 200 mg by mouth daily.  11/11/14   [provider]  amLODipine (NORVASC) 10 MG tablet Take 1 tablet by mouth daily. 06/29/15 05/29/19  [provider]  gabapentin (NEURONTIN) 300 MG capsule Take 1 capsule (300 mg total) by mouth 3 (three) times daily. 09/07/14   Lloyd Huger, MD  lisinopril (PRINIVIL,ZESTRIL) 20 MG tablet Take 1 tablet by mouth daily. Reported on 08/12/2015    [provider]  lisinopril (ZESTRIL) 40 MG tablet Take 40 mg by mouth daily. 06/11/19   [provider]  metoprolol tartrate (LOPRESSOR) 25 MG tablet Take 25 mg by mouth 2 (two) times daily. Am and pm 12/27/13   [provider]  rivaroxaban (XARELTO) 20 MG TABS tablet Take 20 mg by mouth daily with supper.    [provider]    Allergies Patient has no known allergies.  Family History  Problem Relation Age of Onset  . Hypertension Mother     Social History Social History   Tobacco Use  . Smoking status: Current Every Day Smoker    Packs/day: 0.50    Years: 54.00    Pack years: 27.00    Types: Cigarettes  . Smokeless tobacco: Never Used  Vaping Use  . Vaping Use: Never used  Substance Use Topics  . Alcohol use: Yes    Alcohol/week: 6.0 standard drinks    Types: 6 Cans of beer per week    Comment: moderate beer  . Drug use: Yes    Types: Marijuana    Comment: occasional    Review of Systems  Review of Systems  Constitutional: Negative for chills and fever.  HENT: Negative for sore throat.   Respiratory: Negative for shortness of breath.   Cardiovascular: Negative for chest pain.  Gastrointestinal: Negative for abdominal pain.  Genitourinary: Negative for flank pain.  Musculoskeletal: Negative for neck pain.  Skin: Negative for  rash and wound.  Allergic/Immunologic: Negative for immunocompromised state.  Neurological: Positive for weakness, numbness and headaches.  Hematological: Does not bruise/bleed easily.  All other systems reviewed and are negative.    ____________________________________________  PHYSICAL EXAM:      VITAL SIGNS: ED Triage Vitals [01/06/20 1041]  Enc Vitals Group     BP 120/75     Pulse Rate 64     Resp 18     Temp 97.9 F (36.6 C)     Temp Source Oral     SpO2 98 %     Weight 236 lb (107 kg)     Height 6\' 1"  (1.854 m)     Head Circumference      Peak Flow      Pain Score 0     Pain Loc      Pain Edu?  Excl. in [place]?      Physical Exam Vitals and nursing note reviewed.  Constitutional:      General: He is not in acute distress.    Appearance: He is well-developed and well-nourished.  HENT:     Head: Normocephalic and atraumatic.  Eyes:     Conjunctiva/sclera: Conjunctivae normal.  Cardiovascular:     Rate and Rhythm: Normal rate and regular rhythm.     Heart sounds: Normal heart sounds.  Pulmonary:     Effort: Pulmonary effort is normal. No respiratory distress.     Breath sounds: No wheezing.  Abdominal:     General: There is no distension.  Musculoskeletal:        General: No edema.     Cervical back: Neck supple.  Skin:    General: Skin is warm.     Capillary Refill: Capillary refill takes less than 2 seconds.     Findings: No rash.  Neurological:     Mental Status: He is alert and oriented to person, place, and time.     Motor: No abnormal muscle tone.     Comments: Neurological Exam:  Mental Status: Alert and oriented to person, place, and time. Attention and concentration normal. Speech clear. Recent memory is intact. Cranial Nerves: Visual fields grossly intact. EOMI and PERRLA. No nystagmus noted. Facial sensation intact at forehead, maxillary cheek, and chin/mandible bilaterally. No facial asymmetry or weakness. Hearing grossly normal. Uvula is  midline, and palate elevates symmetrically. Normal SCM and trapezius strength. Tongue midline without fasciculations. Motor: Muscle strength 5/5 in proximal and distal UE and LE bilaterally. No pronator drift. Muscle tone normal.  Sensation: Intact to light touch in upper and lower extremities distally bilaterally.  Coordination: Slight dysmetria on LUE FTN testing         ____________________________________________   LABS (all labs ordered are listed, but only abnormal results are displayed)  Labs Reviewed  PROTIME-INR - Abnormal; Notable for the following components:      Result Value   Prothrombin Time 26.4 (*)    INR 2.5 (*)    All other components within normal limits  APTT - Abnormal; Notable for the following components:   aPTT 40 (*)    All other components within normal limits  COMPREHENSIVE METABOLIC PANEL - Abnormal; Notable for the following components:   BUN 31 (*)    Creatinine, Ser 2.16 (*)    Total Bilirubin 1.4 (*)    GFR, Estimated 33 (*)    All other components within normal limits  RESP PANEL BY RT-PCR (FLU A&B, COVID) ARPGX2  CBC  DIFFERENTIAL  CBG MONITORING, ED  I-STAT CREATININE, ED    ____________________________________________  EKG: Sinus bradycardia, VR 51. PR 200, QRS 72, QTc 400. No acute ST elevations. Non specific TW changes, previously seen. ________________________________________  RADIOLOGY All imaging, including plain films, CT scans, and ultrasounds, independently reviewed by me, and interpretations confirmed via formal radiology reads.  ED MD interpretation:   CT Head: Age advanced disease, no bleed MR Brain: No acute stroke or abnormality  Official radiology report(s): MR BRAIN WO CONTRAST  Result Date: 01/06/2020 CLINICAL DATA:  Neuro deficit, acute stroke suspected. Sudden onset of blurred vision. Headache, dizziness. EXAM: MRI HEAD WITHOUT CONTRAST TECHNIQUE: Multiplanar, multiecho pulse sequences of the brain and  surrounding structures were obtained without intravenous contrast. COMPARISON:  None. FINDINGS: Brain: No acute infarction, hemorrhage, hydrocephalus, extra-axial collection or mass lesion. Prominent dilated perivascular spaces in the supratentorial brain. There are additional  T2/FLAIR hyperintensities in the white matter, likely related to chronic microvascular ischemic disease. Vascular: Major arterial flow voids are maintained at the skull base. Skull and upper cervical spine: Normal marrow signal. Sinuses/Orbits: Scattered paranasal sinus mucosal thickening without air-fluid levels. Other: No mastoid effusions. IMPRESSION: 1. No evidence of acute intracranial abnormality. Specifically, no acute infarct. 2. Scattered dilated perivascular spaces and chronic microvascular ischemic disease. Electronically Signed   By: Margaretha Sheffield MD   On: 01/06/2020 13:21   CT HEAD CODE STROKE WO CONTRAST  Result Date: 01/06/2020 CLINICAL DATA:  Code stroke.  Transient ischemic attack. EXAM: CT HEAD WITHOUT CONTRAST TECHNIQUE: Contiguous axial images were obtained from the base of the skull through the vertex without intravenous contrast. COMPARISON:  CT head October 30, 2014. FINDINGS: Brain: No evidence of acute large vascular territory infarction, hemorrhage, hydrocephalus, extra-axial collection or mass lesion/mass effect. Similar patchy areas of subcortical and periventricular hypoattenuation, which are nonspecific but most likely relate to chronic microvascular ischemic disease. Vascular: Calcific atherosclerosis. Skull: No acute fracture. Sinuses/Orbits: Mild scattered mucosal thickening. Unremarkable orbits. Other: No mastoid effusions. ASPECTS [facility] Stroke Program Early CT Score) Total score (0-10 with 10 being normal): 10 IMPRESSION: 1. No evidence of acute intracranial abnormality. ASPECTS is 10. 2. Similar appearance of age-advanced patchy subcortical and periventricular white matter hypoattenuation, most  likely related to chronic microvascular ischemic disease. MRI could further characterize if clinically indicated. Code stroke imaging results were communicated on 01/06/2020 at 11:02 am to provider Dr. Ellender Hose via telephone, who verbally acknowledged these results. Electronically Signed   By: Margaretha Sheffield MD   On: 01/06/2020 11:07    ____________________________________________  PROCEDURES   Procedure(s) performed (including Critical Care):  Procedures  ____________________________________________  INITIAL IMPRESSION / MDM / [facility] / ED COURSE  As part of my medical decision making, I reviewed the following data within the [facility] notes reviewed and incorporated, Old chart reviewed, Notes from prior ED visits, and [place] Controlled Substance Database       *James Keller was evaluated in Emergency Department on 01/06/2020 for the symptoms described in the history of present illness. He was evaluated in the context of the global COVID-19 pandemic, which necessitated consideration that the patient might be at risk for infection with the SARS-CoV-2 virus that causes COVID-19. Institutional protocols and algorithms that pertain to the evaluation of patients at risk for COVID-19 are in a state of rapid change based on information released by regulatory bodies including the CDC and federal and state organizations. These policies and algorithms were followed during the patient's care in the ED.  Some ED evaluations and interventions may be delayed as a result of limited staffing during the pandemic.*     Medical Decision Making: 69 yo M here with transient left arm numbness, dizziness, blurred vision. Sx now resolved. Suspect TIA clinically. Pt initially activated as a CODE STROKE, with negative CT and MR Brain. Dr. Rory Percy, with Neurology, evaluated pt and recommends outpt follow-up given that pt is already on anticoagulation and maximal medical therapy,  with no signs of acute CVA. Feel this is reasonable and pt is in agreement with this plan. Otherwise, lab work shows slight increased in baseline Cr - given cautious fluids here and can f/u as outpt. EKG nonischemic and trop negative, no arrhythmia noted on telemetry, do not suspect ACS or cardiac etiology.   Discussed case with pt. Will have him f/u with Cardiology, Neurology, and return as needed. ____________________________________________  FINAL CLINICAL IMPRESSION(S) / ED DIAGNOSES  Final diagnoses:  Stroke-like symptoms  Dehydration     MEDICATIONS GIVEN DURING THIS VISIT:  Medications  sodium chloride flush (NS) 0.9 % injection 3 mL (3 mLs Intravenous Not Given 01/06/20 1114)  sodium chloride 0.9 % bolus 500 mL (0 mLs Intravenous Stopped 01/06/20 1522)     ED Discharge Orders    None       Note:  This document was prepared using Dragon voice recognition software and may include unintentional dictation errors.   Duffy Bruce, MD 01/06/20 1659

## 2020-01-06 NOTE — ED Notes (Signed)
Assumed care of pt at this time.

## 2020-03-11 ENCOUNTER — Other Ambulatory Visit: Payer: Self-pay | Admitting: Neurology

## 2020-03-11 DIAGNOSIS — I6521 Occlusion and stenosis of right carotid artery: Secondary | ICD-10-CM

## 2020-03-11 DIAGNOSIS — I6522 Occlusion and stenosis of left carotid artery: Secondary | ICD-10-CM

## 2020-03-16 ENCOUNTER — Other Ambulatory Visit: Payer: Self-pay

## 2020-03-16 ENCOUNTER — Ambulatory Visit
Admission: RE | Admit: 2020-03-16 | Discharge: 2020-03-16 | Disposition: A | Discharge status: Home / Self Care | Payer: Medicare HMO | Source: Ambulatory Visit | Attending: Neurology | Admitting: Neurology

## 2020-03-16 DIAGNOSIS — I6522 Occlusion and stenosis of left carotid artery: Secondary | ICD-10-CM | POA: Diagnosis present

## 2020-03-16 DIAGNOSIS — I6521 Occlusion and stenosis of right carotid artery: Secondary | ICD-10-CM | POA: Diagnosis not present

## 2020-05-28 ENCOUNTER — Inpatient Hospital Stay: Payer: Medicare HMO | Attending: Oncology

## 2020-05-28 ENCOUNTER — Other Ambulatory Visit: Payer: Self-pay

## 2020-05-28 ENCOUNTER — Other Ambulatory Visit: Payer: Self-pay | Admitting: Oncology

## 2020-05-28 DIAGNOSIS — F17210 Nicotine dependence, cigarettes, uncomplicated: Secondary | ICD-10-CM | POA: Insufficient documentation

## 2020-05-28 DIAGNOSIS — Z85038 Personal history of other malignant neoplasm of large intestine: Secondary | ICD-10-CM | POA: Diagnosis not present

## 2020-05-28 DIAGNOSIS — C186 Malignant neoplasm of descending colon: Secondary | ICD-10-CM

## 2020-05-28 DIAGNOSIS — N2889 Other specified disorders of kidney and ureter: Secondary | ICD-10-CM | POA: Insufficient documentation

## 2020-05-28 DIAGNOSIS — C772 Secondary and unspecified malignant neoplasm of intra-abdominal lymph nodes: Secondary | ICD-10-CM

## 2020-05-28 LAB — COMPREHENSIVE METABOLIC PANEL
ALT: 22 U/L (ref 0–44)
AST: 20 U/L (ref 15–41)
Albumin: 3.8 g/dL (ref 3.5–5.0)
Alkaline Phosphatase: 48 U/L (ref 38–126)
Anion gap: 9 (ref 5–15)
BUN: 25 mg/dL — ABNORMAL HIGH (ref 8–23)
CO2: 24 mmol/L (ref 22–32)
Calcium: 8.9 mg/dL (ref 8.9–10.3)
Chloride: 106 mmol/L (ref 98–111)
Creatinine, Ser: 1.88 mg/dL — ABNORMAL HIGH (ref 0.61–1.24)
GFR, Estimated: 38 mL/min — ABNORMAL LOW (ref >60)
Glucose, Bld: 121 mg/dL — ABNORMAL HIGH (ref 70–99)
Potassium: 4.4 mmol/L (ref 3.5–5.1)
Sodium: 139 mmol/L (ref 135–145)
Total Bilirubin: 0.9 mg/dL (ref 0.3–1.2)
Total Protein: 6.5 g/dL (ref 6.5–8.1)

## 2020-05-28 LAB — CBC WITH DIFFERENTIAL/PLATELET
Abs Immature Granulocytes: 0.01 10*3/uL (ref 0.00–0.07)
Basophils Absolute: 0 10*3/uL (ref 0.0–0.1)
Basophils Relative: 1 %
Eosinophils Absolute: 0.1 10*3/uL (ref 0.0–0.5)
Eosinophils Relative: 3 %
HCT: 42.1 % (ref 39.0–52.0)
Hemoglobin: 13.6 g/dL (ref 13.0–17.0)
Immature Granulocytes: 0 %
Lymphocytes Relative: 27 %
Lymphs Abs: 1.1 10*3/uL (ref 0.7–4.0)
MCH: 28.8 pg (ref 26.0–34.0)
MCHC: 32.3 g/dL (ref 30.0–36.0)
MCV: 89.2 fL (ref 80.0–100.0)
Monocytes Absolute: 0.3 10*3/uL (ref 0.1–1.0)
Monocytes Relative: 8 %
Neutro Abs: 2.5 10*3/uL (ref 1.7–7.7)
Neutrophils Relative %: 61 %
Platelets: 197 10*3/uL (ref 150–400)
RBC: 4.72 MIL/uL (ref 4.22–5.81)
RDW: 14.3 % (ref 11.5–15.5)
WBC: 4.1 10*3/uL (ref 4.0–10.5)
nRBC: 0 % (ref 0.0–0.2)

## 2020-05-29 LAB — CEA: CEA: 5.3 ng/mL — ABNORMAL HIGH (ref 0.0–4.7)

## 2020-05-31 ENCOUNTER — Inpatient Hospital Stay: Payer: Medicare HMO | Admitting: Oncology

## 2020-12-24 ENCOUNTER — Other Ambulatory Visit: Payer: Self-pay | Admitting: Student

## 2020-12-24 DIAGNOSIS — I480 Paroxysmal atrial fibrillation: Secondary | ICD-10-CM

## 2020-12-24 DIAGNOSIS — Z9189 Other specified personal risk factors, not elsewhere classified: Secondary | ICD-10-CM

## 2020-12-24 DIAGNOSIS — Z79899 Other long term (current) drug therapy: Secondary | ICD-10-CM

## 2021-05-07 ENCOUNTER — Emergency Department: Payer: Medicare HMO

## 2021-05-07 ENCOUNTER — Emergency Department
Admission: EM | Admit: 2021-05-07 | Discharge: 2021-05-07 | Disposition: A | Discharge status: Home / Self Care | Payer: Medicare HMO | Attending: Emergency Medicine | Admitting: Emergency Medicine

## 2021-05-07 DIAGNOSIS — Z7901 Long term (current) use of anticoagulants: Secondary | ICD-10-CM | POA: Diagnosis not present

## 2021-05-07 DIAGNOSIS — M7989 Other specified soft tissue disorders: Secondary | ICD-10-CM | POA: Diagnosis present

## 2021-05-07 MED ORDER — FUROSEMIDE 20 MG PO TABS
10.0000 mg | ORAL_TABLET | Freq: Once | ORAL | Status: AC
Start: 2021-05-07 — End: 2021-05-07
  Administered 2021-05-07: 10 mg via ORAL
  Filled 2021-05-07: qty 0.5

## 2021-05-07 MED ORDER — FUROSEMIDE 20 MG PO TABS: 10.0000 mg | ORAL_TABLET | Freq: Every day | ORAL | 0 refills | Status: DC

## 2021-05-07 MED ORDER — ACETAMINOPHEN 325 MG PO TABS
650.0000 mg | ORAL_TABLET | Freq: Once | ORAL | Status: AC
  Administered 2021-05-07: 650 mg via ORAL
  Filled 2021-05-07: qty 2

## 2021-05-07 NOTE — ED Provider Notes (Signed)
? ?  [facility] ?Provider Note ? ? ? Event Date/Time  ? First MD Initiated Contact with Patient 05/07/21 1340   ?  (approximate) ? ? ?History  ? ?Leg Swelling ? ? ?HPI ?James Keller is a 71 y.o. male patient complain of right lower leg/calf pain and swelling which started last night.  Patient reported history of blood clots in the affected extremity and is taking Xarelto as prescribed.  Denies dyspnea or chest pain. ? ? ?Physical Exam  ? ?Triage Vital Signs: ?ED Triage Vitals [05/07/21 1248]  ?Enc Vitals Group  ?   BP (!) 129/93  ?   Pulse Rate (!) 50  ?   Resp 18  ?   Temp 98 ?F (36.7 ?C)  ?   Temp Source Oral  ?   SpO2 97 %  ?   Weight   ?   Height '6\' 1"'$  (1.854 m)  ?   Head Circumference   ?   Peak Flow   ?   Pain Score 6  ?   Pain Loc   ?   Pain Edu?   ?   Excl. in [place]?   ? ? ?Most recent vital signs: ?Vitals:  ? 05/07/21 1248  ?BP: (!) 129/93  ?Pulse: (!) 50  ?Resp: 18  ?Temp: 98 ?F (36.7 ?C)  ?SpO2: 97%  ? ? ? ?General: Awake, no distress.  ?CV:  Good peripheral perfusion.  ?Resp:  Normal effort.  ?Abd:  No distention.  ?Other:  Mild pitting edema l right lower extremity. ? ?ED Results / Procedures / Treatments  ? ? ? ? ? ? ? ? ? ?RADIOLOGY ? ?Ultrasound was negative for DVT of the right lower extremity. ? ?PROCEDURES: ? ?Critical Care performed: No ? ?Procedures ? ? ?MEDICATIONS ORDERED IN ED: ?Medications  ?furosemide (LASIX) tablet 10 mg (has no administration in time range)  ?acetaminophen (TYLENOL) tablet 650 mg (has no administration in time range)  ? ? ? ?IMPRESSION / MDM / ASSESSMENT AND PLAN / ED COURSE  ?I reviewed the triage vital signs and the nursing notes. ?             ?               ? ?Differential diagnosis includes, but is not limited to, DVT, muscle strain, peripheral edema. ? ?Discussed no DVT found on ultrasound the right lower extremity.  Patient complaining for exam consistent with peripheral edema.  Patient given discharge care instruction advised continue  previous medication.  Take extra strength Tylenol as needed for pain and take 10 mg of Lasix until evaluation by PCP.  Return to ED if condition worsens. ? ?FINAL CLINICAL IMPRESSION(S) / ED DIAGNOSES  ? ?Final diagnoses:  ?Right leg swelling  ? ? ? ?Rx / DC Orders  ? ?ED Discharge Orders   ? ?      Ordered  ?  furosemide (LASIX) 20 MG tablet  Daily,   Status:  Discontinued       ? 05/07/21 1430  ?  furosemide (LASIX) 20 MG tablet  Daily       ? 05/07/21 1431  ? ?  ?  ? ?  ? ? ? ?Note:  This document was prepared using Dragon voice recognition software and may include unintentional dictation errors. ? ?  ?Sable Feil, PA-C ?05/07/21 1435 ? ?  Delman Kitten, MD ?05/07/21 1952 ? ?

## 2021-05-07 NOTE — Discharge Instructions (Addendum)
No DVT found on ultrasound today.  Read and follow discharge care instruction.  Advised extra strength Tylenol as needed for pain.  Take low-dose Lasix as directed.  Follow-up with PCP in 2 to 3 days.  Return to ED if condition worsens. ?

## 2021-05-07 NOTE — ED Notes (Signed)
Sent med message to pharmacy for missing lasix. ?

## 2021-05-07 NOTE — ED Triage Notes (Signed)
Patient to ER via POV with complaints of right lower leg/ calf swelling and pain that started on Friday night. Reports hx of blood clot in affected extremity, has been taking xarelto as prescribed.  ?

## 2021-06-13 ENCOUNTER — Emergency Department
Admission: EM | Admit: 2021-06-13 | Discharge: 2021-06-13 | Disposition: A | Discharge status: Home / Self Care | Payer: Medicare HMO | Attending: Emergency Medicine | Admitting: Emergency Medicine

## 2021-06-13 ENCOUNTER — Other Ambulatory Visit: Payer: Self-pay

## 2021-06-13 ENCOUNTER — Encounter: Payer: Self-pay | Admitting: Emergency Medicine

## 2021-06-13 DIAGNOSIS — R319 Hematuria, unspecified: Secondary | ICD-10-CM | POA: Diagnosis present

## 2021-06-13 DIAGNOSIS — I10 Essential (primary) hypertension: Secondary | ICD-10-CM | POA: Diagnosis not present

## 2021-06-13 DIAGNOSIS — N3001 Acute cystitis with hematuria: Secondary | ICD-10-CM | POA: Diagnosis not present

## 2021-06-13 DIAGNOSIS — N390 Urinary tract infection, site not specified: Secondary | ICD-10-CM

## 2021-06-13 LAB — URINALYSIS, ROUTINE W REFLEX MICROSCOPIC
Bilirubin Urine: NEGATIVE
Glucose, UA: NEGATIVE mg/dL
Ketones, ur: NEGATIVE mg/dL
Nitrite: NEGATIVE
Protein, ur: 100 mg/dL — AB
RBC / HPF: >50 RBC/hpf — ABNORMAL HIGH (ref 0–5)
Specific Gravity, Urine: 1.025 (ref 1.005–1.030)
Squamous Epithelial / HPF: NONE SEEN (ref 0–5)
WBC, UA: >50 WBC/hpf — ABNORMAL HIGH (ref 0–5)
pH: 5 (ref 5.0–8.0)

## 2021-06-13 MED ORDER — FLUCONAZOLE 100 MG PO TABS: 100.0000 mg | ORAL_TABLET | Freq: Every day | ORAL | 0 refills | Status: AC

## 2021-06-13 MED ORDER — PHENAZOPYRIDINE HCL 95 MG PO TABS: 95.0000 mg | ORAL_TABLET | Freq: Three times a day (TID) | ORAL | 0 refills | Status: DC | PRN

## 2021-06-13 MED ORDER — CEFDINIR 300 MG PO CAPS: 300.0000 mg | ORAL_CAPSULE | Freq: Two times a day (BID) | ORAL | 0 refills | Status: AC

## 2021-06-13 NOTE — ED Triage Notes (Signed)
Pt here with hematuria and urinary incontinence that started today. Pt states it burns when he urinates but denies and abd pain. Pt denies N/V but has had diarrhea.

## 2021-06-13 NOTE — ED Provider Notes (Signed)
[facility] Provider Note   Event Date/Time   First MD Initiated Contact with Patient 06/13/21 1503     (approximate) History  Hematuria  HPI CALVYN KURTZMAN is a 71 y.o. male with a past medical history of atrial fibrillation, hypertension, and iron deficiency who presents for hematuria, dysuria over the last 24 hours.  Patient denies ever having symptoms similar to this in the past.  Patient does endorse associated diarrhea.  Patient also endorses some suprapubic abdominal pain.  Patient currently denies any vision changes, tinnitus, difficulty speaking, facial droop, sore throat, chest pain, shortness of breath, nausea/vomiting/diarrhea, or weakness/numbness/paresthesias in any extremity Physical Exam  Triage Vital Signs: ED Triage Vitals  Enc Vitals Group     BP 06/13/21 1341 (!) 147/76     Pulse Rate 06/13/21 1341 69     Resp 06/13/21 1341 18     Temp 06/13/21 1341 97.6 F (36.4 C)     Temp Source 06/13/21 1341 Oral     SpO2 06/13/21 1341 97 %     Weight 06/13/21 1342 238 lb (108 kg)     Height 06/13/21 1342 '6\' 1"'$  (1.854 m)     Head Circumference --      Peak Flow --      Pain Score 06/13/21 1342 8     Pain Loc --      Pain Edu? --      Excl. in [place]? --    Most recent vital signs: Vitals:   06/13/21 1341  BP: (!) 147/76  Pulse: 69  Resp: 18  Temp: 97.6 F (36.4 C)  SpO2: 97%   General: Awake, oriented x4. CV:  Good peripheral perfusion.  Resp:  Normal effort.  Abd:  No distention.  Other:  Patient is a elderly overweight African-American male laying in bed in no distress.  External genital exam shows normal external circumcised male genitalia without rashes or lesions ED Results / Procedures / Treatments  Labs (all labs ordered are listed, but only abnormal results are displayed) Labs Reviewed  URINALYSIS, ROUTINE W REFLEX MICROSCOPIC - Abnormal; Notable for the following components:      Result Value   Color, Urine AMBER (*)     APPearance CLOUDY (*)    Hgb urine dipstick LARGE (*)    Protein, ur 100 (*)    Leukocytes,Ua MODERATE (*)    RBC / HPF >50 (*)    WBC, UA >50 (*)    Bacteria, UA RARE (*)    All other components within normal limits   PROCEDURES: Critical Care performed: No .1-3 Lead EKG Interpretation Performed by: Naaman Plummer, MD Authorized by: Naaman Plummer, MD   MEDICATIONS ORDERED IN ED: Medications - No data to display IMPRESSION / MDM / [place] / ED COURSE  I reviewed the triage vital signs and the nursing notes.                             Patient is a 71 year old male with the above-stated past medical history who presents for 1 day of hematuria and dysuria with associated abdominal pain and diarrhea No e/o epididymo-orchitis on exam and low suspicion for rectal abscess, prostatitis, other GU deep space infection, gonorrhea/chlamydia. Unlikely Infected Urolithiasis, AAA, cholecystitis, pancreatitis, SBO, appendicitis, or other acute abdomen. Workup: UA: None Rx: Cefdinir 300 mg twice daily x5 days  Disposition: Discharge home. SRP discussed. Advise follow up with  primary care provider within 24-72 hours.    FINAL CLINICAL IMPRESSION(S) / ED DIAGNOSES   Final diagnoses:  Lower urinary tract infectious disease  Hematuria, unspecified type  Acute cystitis with hematuria   Rx / DC Orders   ED Discharge Orders          Ordered    cefdinir (OMNICEF) 300 MG capsule  2 times daily        06/13/21 1618    fluconazole (DIFLUCAN) 100 MG tablet  Daily        06/13/21 1618    phenazopyridine (PYRIDIUM) 95 MG tablet  3 times daily PRN        06/13/21 1618           Note:  This document was prepared using Dragon voice recognition software and may include unintentional dictation errors.   Naaman Plummer, MD 06/13/21 [phone]

## 2021-08-02 ENCOUNTER — Telehealth: Payer: Self-pay | Admitting: Oncology

## 2021-08-02 NOTE — Telephone Encounter (Signed)
FYI: Patient significant other stopped by to make a follow up appointment for him. Per Jerene Pitch I added him to [place] schedule for next week. I scheduled him for labs and then to see her.  Thank you

## 2021-08-08 ENCOUNTER — Inpatient Hospital Stay: Payer: Medicare HMO | Attending: Oncology | Admitting: Oncology

## 2021-08-08 ENCOUNTER — Other Ambulatory Visit: Payer: Self-pay | Admitting: *Deleted

## 2021-08-08 ENCOUNTER — Inpatient Hospital Stay: Payer: Medicare HMO | Admitting: Oncology

## 2021-08-08 ENCOUNTER — Encounter: Payer: Self-pay | Admitting: Oncology

## 2021-08-08 VITALS — BP 134/75 | HR 46 | Temp 96.7°F | Resp 18 | Wt 227.3 lb

## 2021-08-08 DIAGNOSIS — C772 Secondary and unspecified malignant neoplasm of intra-abdominal lymph nodes: Secondary | ICD-10-CM

## 2021-08-08 DIAGNOSIS — R970 Elevated carcinoembryonic antigen [CEA]: Secondary | ICD-10-CM | POA: Diagnosis not present

## 2021-08-08 DIAGNOSIS — N189 Chronic kidney disease, unspecified: Secondary | ICD-10-CM | POA: Diagnosis not present

## 2021-08-08 DIAGNOSIS — R7989 Other specified abnormal findings of blood chemistry: Secondary | ICD-10-CM | POA: Insufficient documentation

## 2021-08-08 DIAGNOSIS — N390 Urinary tract infection, site not specified: Secondary | ICD-10-CM | POA: Insufficient documentation

## 2021-08-08 DIAGNOSIS — Z85038 Personal history of other malignant neoplasm of large intestine: Secondary | ICD-10-CM | POA: Diagnosis not present

## 2021-08-08 DIAGNOSIS — C186 Malignant neoplasm of descending colon: Secondary | ICD-10-CM

## 2021-08-08 DIAGNOSIS — N2889 Other specified disorders of kidney and ureter: Secondary | ICD-10-CM | POA: Insufficient documentation

## 2021-08-08 DIAGNOSIS — F17210 Nicotine dependence, cigarettes, uncomplicated: Secondary | ICD-10-CM | POA: Insufficient documentation

## 2021-08-08 DIAGNOSIS — Z79899 Other long term (current) drug therapy: Secondary | ICD-10-CM | POA: Insufficient documentation

## 2021-08-08 DIAGNOSIS — D649 Anemia, unspecified: Secondary | ICD-10-CM | POA: Diagnosis not present

## 2021-08-08 LAB — COMPREHENSIVE METABOLIC PANEL
ALT: 18 U/L (ref 0–44)
AST: 14 U/L — ABNORMAL LOW (ref 15–41)
Albumin: 3.9 g/dL (ref 3.5–5.0)
Alkaline Phosphatase: 52 U/L (ref 38–126)
Anion gap: 3 — ABNORMAL LOW (ref 5–15)
BUN: 31 mg/dL — ABNORMAL HIGH (ref 8–23)
CO2: 28 mmol/L (ref 22–32)
Calcium: 9.3 mg/dL (ref 8.9–10.3)
Chloride: 111 mmol/L (ref 98–111)
Creatinine, Ser: 2.02 mg/dL — ABNORMAL HIGH (ref 0.61–1.24)
GFR, Estimated: 35 mL/min — ABNORMAL LOW (ref >60)
Glucose, Bld: 96 mg/dL (ref 70–99)
Potassium: 4.4 mmol/L (ref 3.5–5.1)
Sodium: 142 mmol/L (ref 135–145)
Total Bilirubin: 0.8 mg/dL (ref 0.3–1.2)
Total Protein: 6.6 g/dL (ref 6.5–8.1)

## 2021-08-08 LAB — URINALYSIS, COMPLETE (UACMP) WITH MICROSCOPIC
Bacteria, UA: NONE SEEN
Bilirubin Urine: NEGATIVE
Glucose, UA: NEGATIVE mg/dL
Hgb urine dipstick: NEGATIVE
Ketones, ur: NEGATIVE mg/dL
Nitrite: NEGATIVE
Specific Gravity, Urine: >1.03 — ABNORMAL HIGH (ref 1.005–1.030)
pH: 5.5 (ref 5.0–8.0)

## 2021-08-08 LAB — CBC WITH DIFFERENTIAL/PLATELET
Abs Immature Granulocytes: 0.02 10*3/uL (ref 0.00–0.07)
Basophils Absolute: 0 10*3/uL (ref 0.0–0.1)
Basophils Relative: 1 %
Eosinophils Absolute: 0.2 10*3/uL (ref 0.0–0.5)
Eosinophils Relative: 3 %
HCT: 40.2 % (ref 39.0–52.0)
Hemoglobin: 12.8 g/dL — ABNORMAL LOW (ref 13.0–17.0)
Immature Granulocytes: 0 %
Lymphocytes Relative: 23 %
Lymphs Abs: 1.2 10*3/uL (ref 0.7–4.0)
MCH: 29 pg (ref 26.0–34.0)
MCHC: 31.8 g/dL (ref 30.0–36.0)
MCV: 91 fL (ref 80.0–100.0)
Monocytes Absolute: 0.5 10*3/uL (ref 0.1–1.0)
Monocytes Relative: 9 %
Neutro Abs: 3.2 10*3/uL (ref 1.7–7.7)
Neutrophils Relative %: 64 %
Platelets: 167 10*3/uL (ref 150–400)
RBC: 4.42 MIL/uL (ref 4.22–5.81)
RDW: 15 % (ref 11.5–15.5)
WBC: 5 10*3/uL (ref 4.0–10.5)
nRBC: 0 % (ref 0.0–0.2)

## 2021-08-08 NOTE — Progress Notes (Signed)
Patient here for follow up. Pt reports he was recently on antibiotics for kidney infection.

## 2021-08-08 NOTE — Progress Notes (Addendum)
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  INTERVAL HISTORY: James Keller is a 71 year old male who returns today for follow-up.  He has not been seen in over 2 years.  Last colonoscopy/EGD was in December 2020 which was negative. He recently presented to ED with complaints of a UTI and started on antibiotics.  Antibiotics were switched after culture but he never picked up his prescription of Augmentin.  States urinary symptoms resolved with initial prescription.  Denies any new or additional urinary symptoms.  Denies abdominal pain.  Eating and drinking well.  REVIEW OF SYSTEMS:   Review of Systems  All other systems reviewed and are negative.   As per HPI. Otherwise, a complete review of systems is negative.  PAST MEDICAL HISTORY: Past Medical History:  Diagnosis Date   Colon cancer ([place])    a. 11/2013 s/p colon resection/Hartmann's procedure;  b. Chemo: FOLFOX completed 07/2014.   DVT (deep venous thrombosis) (HCC)    RIGHT LEG/ HX OF   DVT (deep venous thrombosis) (HCC)    Dysrhythmia    Hypertension    Lower extremity edema    Rt leg   Neuromuscular disorder (HCC)    TINGLING IN FINGERS and feet   PAF (paroxysmal atrial fibrillation) (HCC)    S/P chemotherapy, time since less than 4 weeks    Tobacco abuse     PAST SURGICAL HISTORY: Past Surgical History:  Procedure Laterality Date   COLON RESECTION  12/19/2013   colostomy   COLON SURGERY     COLONOSCOPY WITH PROPOFOL N/A 09/14/2014   Procedure: COLONOSCOPY WITH PROPOFOL THROUGH COLOSTOMY AND COLONOSCOPY THRU RECTUM;  Surgeon: Lucilla Lame,  MD;  Location: [facility];  Service: Endoscopy;  Laterality: N/A;  TRANSVERSE COLON POLYP AND SIGMOID COLON POLYP   COLONOSCOPY WITH PROPOFOL N/A 11/29/2015   Procedure: COLONOSCOPY WITH PROPOFOL;  Surgeon: [name], MD;  Location: [facility] ENDOSCOPY;  Service: Endoscopy;  Laterality: N/A;   COLONOSCOPY WITH PROPOFOL N/A 01/21/2019   Procedure: COLONOSCOPY WITH PROPOFOL;  Surgeon: Lucilla Lame, MD;  Location: [facility] ENDOSCOPY;  Service: Endoscopy;  Laterality: N/A;   COLOSTOMY REVERSAL N/A 10/28/2014   Procedure: COLOSTOMY REVERSAL;  Surgeon: Marlyce Huge, MD;  Location: [facility] ORS;  Service: General;  Laterality: N/A;   ESOPHAGOGASTRODUODENOSCOPY (EGD) WITH PROPOFOL N/A 01/21/2019   Procedure: ESOPHAGOGASTRODUODENOSCOPY (EGD) WITH PROPOFOL;  Surgeon: Lucilla Lame, MD;  Location: [facility] ENDOSCOPY;  Service: Endoscopy;  Laterality: N/A;   FEMUR FRACTURE SURGERY Right    FRACTURE SURGERY     LEG SURGERY     LYSIS OF ADHESION  10/28/2014   Procedure: LYSIS OF ADHESION;  Surgeon: Marlyce Huge, MD;  Location: [facility] ORS;  Service: General;;   PORT A CATH INJECTION ([place] HX) Right    TUNNELED VENOUS PORT PLACEMENT      FAMILY HISTORY: Grandmother with "liver cancer".     ADVANCED DIRECTIVES:    HEALTH MAINTENANCE: Social History   Tobacco Use   Smoking status: Every Day    Packs/day: 0.50    Years: 54.00    Total pack years: 27.00    Types: Cigarettes   Smokeless tobacco: Never  Vaping Use   Vaping Use: Never used  Substance Use Topics   Alcohol use: Yes    Alcohol/week: 6.0 standard drinks of alcohol    Types: 6 Cans of beer per week    Comment: moderate beer   Drug use: Yes    Types: Marijuana    Comment: occasional     Colonoscopy:  PAP:  Bone density:  Lipid panel:  No Known Allergies   Current Outpatient Medications  Medication Sig Dispense Refill   amiodarone (PACERONE) 200 MG tablet Take 200 mg by mouth daily.      amlodipine-atorvastatin  (CADUET) 10-20 MG tablet Take 1 tablet by mouth daily.     furosemide (LASIX) 20 MG tablet Take 0.5 tablets (10 mg total) by mouth daily. 10 tablet 0   gabapentin (NEURONTIN) 300 MG capsule Take 1 capsule (300 mg total) by mouth 3 (three) times daily. 90 capsule 1   lisinopril (PRINIVIL,ZESTRIL) 20 MG tablet Take 1 tablet by mouth daily. Reported on 08/12/2015     metoprolol tartrate (LOPRESSOR) 25 MG tablet Take 25 mg by mouth 2 (two) times daily. Am and pm     phenazopyridine (PYRIDIUM) 95 MG tablet Take 1 tablet (95 mg total) by mouth 3 (three) times daily as needed for pain. 10 tablet 0   rivaroxaban (XARELTO) 20 MG TABS tablet Take 20 mg by mouth daily with supper.     amLODipine (NORVASC) 10 MG tablet Take 1 tablet by mouth daily. (Patient not taking: Reported on 08/08/2021)     amoxicillin-clavulanate (AUGMENTIN) 875-125 MG tablet Take 1 tablet by mouth 2 (two) times daily. (Patient not taking: Reported on 08/08/2021)     lisinopril (ZESTRIL) 40 MG tablet Take 40 mg by mouth daily.     No current facility-administered medications for this visit.   Facility-Administered Medications Ordered in Other Visits  Medication Dose Route Frequency Provider Last Rate Last Admin   heparin lock flush 100 unit/mL  500 Units Intracatheter PRN Grayland Ormond, Kathlene November, MD       sodium chloride 0.9 % injection 10 mL  10 mL Intracatheter PRN Lloyd Huger, MD   10 mL at 10/19/14 1353   sodium chloride flush (NS) 0.9 % injection 10 mL  10 mL Intracatheter PRN Lloyd Huger, MD       sodium chloride flush (NS) 0.9 % injection 10 mL  10 mL Intravenous PRN Lloyd Huger, MD   10 mL at 08/13/17 1442    OBJECTIVE: Vitals:   08/08/21 0936  BP: 134/75  Pulse: (!) 46  Resp: 18  Temp: (!) 96.7 F (35.9 C)     Body mass index is 29.99 kg/m.    ECOG FS:0 - Asymptomatic  Physical Exam Constitutional:      Appearance: Normal appearance.  HENT:     Head: Normocephalic and atraumatic.  Eyes:      Pupils: Pupils are equal, round, and reactive to light.  Cardiovascular:     Rate and Rhythm: Normal rate and regular rhythm.     Heart sounds: Normal heart sounds. No murmur heard. Pulmonary:     Effort: Pulmonary effort is normal.     Breath sounds: Normal breath sounds. No wheezing.  Abdominal:     General: Bowel sounds are normal. There is no distension.     Palpations: Abdomen is soft.     Tenderness: There is no abdominal tenderness.  Musculoskeletal:        General: Normal range of motion.     Cervical back: Normal range of motion.  Skin:    General: Skin is warm and dry.     Findings: No rash.  Neurological:     Mental Status: He is alert and oriented to person, place, and time.  Psychiatric:        Judgment: Judgment normal.      LAB RESULTS:  Lab Results  Component Value Date   NA 142 08/08/2021   K 4.4 08/08/2021   CL 111 08/08/2021   CO2 28 08/08/2021   GLUCOSE 96 08/08/2021   BUN 31 (H) 08/08/2021   CREATININE 2.02 (H) 08/08/2021   CALCIUM 9.3 08/08/2021   PROT 6.6 08/08/2021   ALBUMIN 3.9 08/08/2021   AST 14 (L) 08/08/2021   ALT 18 08/08/2021   ALKPHOS 52 08/08/2021   BILITOT 0.8 08/08/2021   GFRNONAA 35 (L) 08/08/2021   GFRAA 45 (L) 05/29/2019    Lab Results  Component Value Date   WBC 5.0 08/08/2021   NEUTROABS 3.2 08/08/2021   HGB 12.8 (L) 08/08/2021   HCT 40.2 08/08/2021   MCV 91.0 08/08/2021   PLT 167 08/08/2021    STUDIES:    No results found.  ASSESSMENT: Stage IIIB adenocarcinoma of the desending colon, DVT, right kidney mass.  PLAN:    1. Stage IIIB adenocarcinoma of the desending colon- Treated with 12 cycles of FOLFOX completing in July 2016.  Was lost to follow-up.  Last MRI was from 11/27/2018 which was negative for recurrence or metastatic disease.  Last colonoscopy/EGD was on 01/21/2019 which was also negative.  Plan to repeat MRI to follow-up on right kidney mass.  Needs repeat colonoscopy in 2025.  2.  Elevated  creatinine- Baseline creatinine is between 1.5 and 1.88.  Today creatinine is 2.02.  Encouraged hydration.  Encouraged to pick up antibiotics for UTI.  Recommend referral to nephrology given chronic nature.  Patient agreeable.  3.  Right kidney mass- MRI per above.  4.  Recent urinary tract infection- Encouraged to pick up Augmentin and start today.  We will recollect UA and culture given he does not have any symptoms.  5.  Anemia- Continue to monitor for now.  We will trend and redraw at next visit.  Disposition- MRI of abdomen with and without contrast in 1 to 2 weeks.  Follow-up with Dr. Grayland Ormond after.  We will call with results from urinalysis.  Addendum- CEA elevated but appears to be baseline. Will monitor. MRI as above. Will let Dr. Grayland Ormond know as well.   Urine culture revealed over 100,000 colonies of Citrobacter Koseri and does not seem to be sensitive to Augmentin.  Discussed with patient switching antibiotic 1 additional time to nitrofurantoin 100 mg tablets twice a day x7 days.  New prescription sent to pharmacy and patient agreeable.  He will stop taking Augmentin and start nitrofurantoin today.  I spent 25 minutes dedicated to the care of this patient (face-to-face and non-face-to-face) on the date of the encounter to include what is described in the assessment and plan.  Patient expressed understanding and was in agreement with this plan. He also understands that He can call clinic at any time with any questions, concerns, or complaints.   Colon cancer   Staging form: Colon/Rectum, AJCC 7th Edition     Pathologic stage from 05/04/2014: Stage IIIB (T4, N1, M0) - Signed by Lloyd Huger, MD on 05/04/2014   Jacquelin Hawking, NP   08/08/2021 10:00 AM

## 2021-08-09 LAB — CEA: CEA: 7 ng/mL — ABNORMAL HIGH (ref 0.0–4.7)

## 2021-08-09 NOTE — Progress Notes (Signed)
James Keller,   This patient has not been seen in over 2 years and is wanting to reestablish care.  Looks like he had a colonoscopy back in 2020 which looked good and recommends repeat in 2025 he also has a right kidney mass that we were following annually.  Looks like his baseline CEA has always been slightly elevated and is up as of yesterday.  No symptoms other than this ongoing UTI which is being treated by his PCP.  I have ordered a repeat MRI of his abdomen to follow-up on kidney mass but wanted to make sure that was all he would like to do at this time.  He has a scheduled follow-up visit with you following his MRI.  Faythe Casa, NP 08/09/2021 8:52 AM

## 2021-08-10 LAB — URINE CULTURE: Culture: >=100000 — AB

## 2021-08-12 MED ORDER — NITROFURANTOIN MONOHYD MACRO 100 MG PO CAPS: 100.0000 mg | ORAL_CAPSULE | Freq: Two times a day (BID) | ORAL | 0 refills | Status: DC

## 2021-08-12 NOTE — Addendum Note (Signed)
Addended by: Faythe Casa E on: 08/12/2021 02:41 PM   Modules accepted: Orders

## 2021-08-17 ENCOUNTER — Ambulatory Visit
Admission: RE | Admit: 2021-08-17 | Discharge: 2021-08-17 | Disposition: A | Discharge status: Home / Self Care | Payer: Medicare HMO | Source: Ambulatory Visit | Attending: Oncology | Admitting: Oncology

## 2021-08-17 DIAGNOSIS — N2889 Other specified disorders of kidney and ureter: Secondary | ICD-10-CM | POA: Insufficient documentation

## 2021-08-17 MED ORDER — GADOBUTROL 1 MMOL/ML IV SOLN
10.0000 mL | Freq: Once | INTRAVENOUS | Status: AC | PRN
  Administered 2021-08-17: 10 mL via INTRAVENOUS

## 2021-08-19 NOTE — Progress Notes (Unsigned)
[place]  [phone] [phone]  ID: James Keller OB: Feb 03, 1950  MR#: [id number]  ERX#:[id number]  Patient Care Team: Center, [facility] as PCP - General Lucilla Lame, MD as Consulting Physician (Gastroenterology) Lloyd Huger, MD as Consulting Physician (Oncology)  CHIEF COMPLAINT: Stage IIIB adenocarcinoma of the desending colon, right kidney mass.  INTERVAL HISTORY: Patient returns to clinic today for routine 68-monthevaluation.  He has occasional constipation, but otherwise feels well.  He has no neurologic complaints. He denies any fevers or illnesses.  He has a good appetite and denies weight loss.  He denies any chest pain, shortness of breath, cough, or hemoptysis.  He denies any nausea, vomiting, or diarrhea.  He has no changes in his bowel movements.  He denies any melena or hematochezia.  He has no urinary complaints.  Patient offers no specific complaints today.  REVIEW OF SYSTEMS:   Review of Systems  Constitutional: Negative.  Negative for fever, malaise/fatigue and weight loss.  Respiratory: Negative.  Negative for cough, hemoptysis and shortness of breath.   Cardiovascular: Negative.  Negative for chest pain and leg swelling.  Gastrointestinal:  Positive for constipation. Negative for abdominal pain, blood in stool and melena.  Genitourinary: Negative.  Negative for frequency, hematuria and urgency.  Musculoskeletal: Negative.  Negative for back pain.  Skin: Negative.  Negative for rash.  Neurological: Negative.  Negative for dizziness, sensory change, focal weakness, weakness and headaches.  Psychiatric/Behavioral: Negative.  The patient is not nervous/anxious.     As per HPI. Otherwise, a complete review of systems is negative.  PAST MEDICAL HISTORY: Past Medical History:  Diagnosis Date   [place]    a. 11/2013 s/p colon resection/Hartmann's procedure;  b. Chemo: FOLFOX completed  07/2014.   DVT (deep venous thrombosis) (HCC)    RIGHT LEG/ HX OF   DVT (deep venous thrombosis) (HCC)    Dysrhythmia    Hypertension    Lower extremity edema    Rt leg   Neuromuscular disorder (HCC)    TINGLING IN FINGERS and feet   PAF (paroxysmal atrial fibrillation) (HCC)    S/P chemotherapy, time since less than 4 weeks    Tobacco abuse     PAST SURGICAL HISTORY: Past Surgical History:  Procedure Laterality Date   COLON RESECTION  12/19/2013   colostomy   COLON SURGERY     COLONOSCOPY WITH PROPOFOL N/A 09/14/2014   Procedure: COLONOSCOPY WITH PROPOFOL THROUGH COLOSTOMY AND COLONOSCOPY THRU RECTUM;  Surgeon: DLucilla Lame MD;  [place]  Service: Endoscopy;  Laterality: N/A;  TRANSVERSE COLON POLYP AND SIGMOID COLON POLYP   COLONOSCOPY WITH PROPOFOL N/A 11/29/2015   Procedure: COLONOSCOPY WITH PROPOFOL;  Surgeon: KJonathon Bellows MD;  Location: [facility] ENDOSCOPY;  Service: Endoscopy;  Laterality: N/A;   COLONOSCOPY WITH PROPOFOL N/A 01/21/2019   Procedure: COLONOSCOPY WITH PROPOFOL;  Surgeon: WLucilla Lame MD;  [facility]  Service: Endoscopy;  Laterality: N/A;   COLOSTOMY REVERSAL N/A 10/28/2014   Procedure: COLOSTOMY REVERSAL;  Surgeon: CMarlyce Huge MD;  Location: [facility] ORS;  Service: General;  Laterality: N/A;   ESOPHAGOGASTRODUODENOSCOPY (EGD) WITH PROPOFOL N/A 01/21/2019   Procedure: ESOPHAGOGASTRODUODENOSCOPY (EGD) WITH PROPOFOL;  Surgeon: WLucilla Lame MD;  Location: [facility] ENDOSCOPY;  Service: Endoscopy;  Laterality: N/A;   FEMUR FRACTURE SURGERY Right    FRACTURE SURGERY     LEG SURGERY     LYSIS OF ADHESION  10/28/2014   Procedure: LYSIS OF ADHESION;  Surgeon:  Marlyce Huge, MD;  Location: [facility] ORS;  Service: General;;   PORT A CATH INJECTION ([facility] HX) Right    TUNNELED VENOUS PORT PLACEMENT      FAMILY HISTORY: Grandmother with "liver cancer".     ADVANCED DIRECTIVES:    HEALTH MAINTENANCE: Social History   Tobacco  Use   Smoking status: Every Day    Packs/day: 0.50    Years: 54.00    Total pack years: 27.00    Types: Cigarettes   Smokeless tobacco: Never  Vaping Use   Vaping Use: Never used  Substance Use Topics   Alcohol use: Yes    Alcohol/week: 6.0 standard drinks of alcohol    Types: 6 Cans of beer per week    Comment: moderate beer   Drug use: Yes    Types: Marijuana    Comment: occasional     Colonoscopy:  PAP:  Bone density:  Lipid panel:  No Known Allergies   Current Outpatient Medications  Medication Sig Dispense Refill   amiodarone (PACERONE) 200 MG tablet Take 200 mg by mouth daily.      amLODipine (NORVASC) 10 MG tablet Take 1 tablet by mouth daily. (Patient not taking: Reported on 08/08/2021)     amlodipine-atorvastatin (CADUET) 10-20 MG tablet Take 1 tablet by mouth daily.     amoxicillin-clavulanate (AUGMENTIN) 875-125 MG tablet Take 1 tablet by mouth 2 (two) times daily. (Patient not taking: Reported on 08/08/2021)     furosemide (LASIX) 20 MG tablet Take 0.5 tablets (10 mg total) by mouth daily. 10 tablet 0   gabapentin (NEURONTIN) 300 MG capsule Take 1 capsule (300 mg total) by mouth 3 (three) times daily. 90 capsule 1   lisinopril (PRINIVIL,ZESTRIL) 20 MG tablet Take 1 tablet by mouth daily. Reported on 08/12/2015     lisinopril (ZESTRIL) 40 MG tablet Take 40 mg by mouth daily.     metoprolol tartrate (LOPRESSOR) 25 MG tablet Take 25 mg by mouth 2 (two) times daily. Am and pm     nitrofurantoin, macrocrystal-monohydrate, (MACROBID) 100 MG capsule Take 1 capsule (100 mg total) by mouth 2 (two) times daily. 14 capsule 0   phenazopyridine (PYRIDIUM) 95 MG tablet Take 1 tablet (95 mg total) by mouth 3 (three) times daily as needed for pain. 10 tablet 0   rivaroxaban (XARELTO) 20 MG TABS tablet Take 20 mg by mouth daily with supper.     No current facility-administered medications for this visit.   Facility-Administered Medications Ordered in Other Visits  Medication Dose  Route Frequency Provider Last Rate Last Admin   heparin lock flush 100 unit/mL  500 Units Intracatheter PRN Grayland Ormond, Kathlene November, MD       sodium chloride 0.9 % injection 10 mL  10 mL Intracatheter PRN Lloyd Huger, MD   10 mL at 10/19/14 1353   sodium chloride flush (NS) 0.9 % injection 10 mL  10 mL Intracatheter PRN Lloyd Huger, MD       sodium chloride flush (NS) 0.9 % injection 10 mL  10 mL Intravenous PRN Lloyd Huger, MD   10 mL at 08/13/17 1442    OBJECTIVE: There were no vitals filed for this visit.    There is no height or weight on file to calculate BMI.    ECOG FS:0 - Asymptomatic  General: Well-developed, well-nourished, no acute distress. Eyes: Pink conjunctiva, anicteric sclera. HEENT: Normocephalic, moist mucous membranes. Lungs: No audible wheezing or coughing. Heart: Regular rate and rhythm. Abdomen: Soft, nontender,  no obvious distention. Musculoskeletal: No edema, cyanosis, or clubbing. Neuro: Alert, answering all questions appropriately. Cranial nerves grossly intact. Skin: No rashes or petechiae noted. Psych: Normal affect.   LAB RESULTS:  Lab Results  Component Value Date   NA 142 08/08/2021   K 4.4 08/08/2021   CL 111 08/08/2021   CO2 28 08/08/2021   GLUCOSE 96 08/08/2021   BUN 31 (H) 08/08/2021   CREATININE 2.02 (H) 08/08/2021   CALCIUM 9.3 08/08/2021   PROT 6.6 08/08/2021   ALBUMIN 3.9 08/08/2021   AST 14 (L) 08/08/2021   ALT 18 08/08/2021   ALKPHOS 52 08/08/2021   BILITOT 0.8 08/08/2021   GFRNONAA 35 (L) 08/08/2021   GFRAA 45 (L) 05/29/2019    Lab Results  Component Value Date   WBC 5.0 08/08/2021   NEUTROABS 3.2 08/08/2021   HGB 12.8 (L) 08/08/2021   HCT 40.2 08/08/2021   MCV 91.0 08/08/2021   PLT 167 08/08/2021    STUDIES:    MR Abdomen W Wo Contrast  Result Date: 08/17/2021 CLINICAL DATA:  Follow-up indeterminate right renal cystic lesion. EXAM: MRI ABDOMEN WITHOUT AND WITH CONTRAST TECHNIQUE: Multiplanar  multisequence MR imaging of the abdomen was performed both before and after the administration of intravenous contrast. CONTRAST:  34m GADAVIST GADOBUTROL 1 MMOL/ML IV SOLN COMPARISON:  11/27/2018 FINDINGS: Lower chest: No acute findings. Hepatobiliary: No hepatic masses identified. Gallbladder is unremarkable. No evidence of biliary ductal dilatation. Pancreas:  No mass or inflammatory changes. Spleen:  Within normal limits in size and appearance. Adrenals/Urinary Tract: Stable tiny T2 hypointense right adrenal nodule, consistent with benign adenoma. Multiple tiny sub-cm benign-appearing cysts are again seen in both kidneys. No evidence of hydronephrosis. A complex cystic lesion is seen in the posterior lower pole of the right kidney which shows numerous thin enhancing internal septations measuring less than 2 mm. A few slightly thickened and irregular septations are seen measuring up to 3 mm. These characteristics remain consistent with a Bosniak category 3 classification. This lesion measures 3.2 x 3.1 cm on today's study (see image 11/3), minimally increased from 3.0 x 2.9 cm on prior exam. Stomach/Bowel: Unremarkable. Vascular/Lymphatic: No pathologically enlarged lymph nodes identified. 4.1 cm infrarenal abdominal aortic aneurysm (see image 69/22) shows slight increase from 3.9 cm when measured in same plane on previous study. Other:  None. Musculoskeletal:  No suspicious bone lesions identified. IMPRESSION: Bosniak category 3 cystic lesion of right kidney shows no change in morphology, and minimal increase in size compared to prior exam in 2020. Slight increase in size of infrarenal abdominal aortic aneurysm measuring 4.1 cm. Recommend follow-up every 12 months and vascular consultation. This recommendation follows ACR consensus guidelines: White Paper of the ACR Incidental Findings Committee II on Vascular Findings. J Am Coll Radiol 2013; 10:789-794. Electronically Signed   By: JMarlaine HindM.D.   On:  08/17/2021 15:16    ASSESSMENT: Stage IIIB adenocarcinoma of the desending colon, DVT, right kidney mass.  PLAN:    1. Stage IIIB adenocarcinoma of the desending colon: Patient completed 12 cycles of FOLFOX on July 29, 2014.  MRI results from November 27, 2018 reviewed independently with no obvious evidence of recurrent or metastatic disease.  Patient CEA remains mildly elevated at 6.8, but this is improved from previous.  Today's result is pending.  He had a colonoscopy and upper endoscopy on January 21, 2019 that did not reveal any significant pathology or evidence of recurrent disease.  No further intervention is needed.  Return to  clinic in 1 year with repeat laboratory work and further evaluation.  If patient continues to have no evidence of disease, he likely can be discharged from clinic.   2. H/o DVT: Patient has discontinued Coumadin and is now on Xarelto. 3. Elevated creatinine: Patient's creatinine has trended up slightly from his baseline of 1.77.  Monitor.   4. Neuropathy: Patient does not complain of this today.   5. Right kidney mass: MRI results as above.  No changes.  Consider repeat imaging in 1 year.  6.  Anemia: Resolved. 7.  Port: Will send referral for port removal in the next 1 to 2 weeks.  Patient expressed understanding and was in agreement with this plan. He also understands that He can call clinic at any time with any questions, concerns, or complaints.   Colon cancer   Staging form: Colon/Rectum, AJCC 7th Edition     Pathologic stage from 05/04/2014: Stage IIIB (T4, N1, M0) - Signed by Lloyd Huger, MD on 05/04/2014   Lloyd Huger, MD   08/19/2021 5:04 PM

## 2021-08-23 ENCOUNTER — Inpatient Hospital Stay: Payer: Medicare HMO | Attending: Oncology | Admitting: Oncology

## 2021-08-23 ENCOUNTER — Encounter: Payer: Self-pay | Admitting: Oncology

## 2021-08-23 VITALS — BP 131/91 | HR 76 | Temp 97.8°F | Resp 18 | Ht 73.0 in | Wt 227.0 lb

## 2021-08-23 DIAGNOSIS — Z9221 Personal history of antineoplastic chemotherapy: Secondary | ICD-10-CM | POA: Diagnosis not present

## 2021-08-23 DIAGNOSIS — C186 Malignant neoplasm of descending colon: Secondary | ICD-10-CM | POA: Diagnosis not present

## 2021-08-23 DIAGNOSIS — Z85038 Personal history of other malignant neoplasm of large intestine: Secondary | ICD-10-CM | POA: Diagnosis not present

## 2021-08-23 DIAGNOSIS — R944 Abnormal results of kidney function studies: Secondary | ICD-10-CM | POA: Diagnosis not present

## 2021-08-23 DIAGNOSIS — F17210 Nicotine dependence, cigarettes, uncomplicated: Secondary | ICD-10-CM | POA: Insufficient documentation

## 2021-08-23 DIAGNOSIS — N2889 Other specified disorders of kidney and ureter: Secondary | ICD-10-CM | POA: Insufficient documentation

## 2021-08-23 DIAGNOSIS — Z7901 Long term (current) use of anticoagulants: Secondary | ICD-10-CM | POA: Diagnosis not present

## 2021-08-23 DIAGNOSIS — C772 Secondary and unspecified malignant neoplasm of intra-abdominal lymph nodes: Secondary | ICD-10-CM

## 2021-08-23 DIAGNOSIS — Z86718 Personal history of other venous thrombosis and embolism: Secondary | ICD-10-CM | POA: Diagnosis not present

## 2021-08-23 DIAGNOSIS — Z79899 Other long term (current) drug therapy: Secondary | ICD-10-CM | POA: Diagnosis not present

## 2023-08-02 ENCOUNTER — Emergency Department

## 2023-08-02 ENCOUNTER — Other Ambulatory Visit: Payer: Self-pay

## 2023-08-02 ENCOUNTER — Telehealth: Payer: Self-pay | Admitting: Oncology

## 2023-08-02 ENCOUNTER — Emergency Department: Admission: EM | Admit: 2023-08-02 | Discharge: 2023-08-02 | Disposition: A | Discharge status: Home / Self Care

## 2023-08-02 DIAGNOSIS — Z7901 Long term (current) use of anticoagulants: Secondary | ICD-10-CM | POA: Diagnosis not present

## 2023-08-02 DIAGNOSIS — M25511 Pain in right shoulder: Secondary | ICD-10-CM | POA: Diagnosis not present

## 2023-08-02 DIAGNOSIS — M25512 Pain in left shoulder: Secondary | ICD-10-CM | POA: Diagnosis not present

## 2023-08-02 DIAGNOSIS — C186 Malignant neoplasm of descending colon: Secondary | ICD-10-CM | POA: Diagnosis not present

## 2023-08-02 DIAGNOSIS — M546 Pain in thoracic spine: Secondary | ICD-10-CM | POA: Diagnosis present

## 2023-08-02 DIAGNOSIS — C801 Malignant (primary) neoplasm, unspecified: Secondary | ICD-10-CM

## 2023-08-02 DIAGNOSIS — R001 Bradycardia, unspecified: Secondary | ICD-10-CM | POA: Insufficient documentation

## 2023-08-02 DIAGNOSIS — I7143 Infrarenal abdominal aortic aneurysm, without rupture: Secondary | ICD-10-CM | POA: Diagnosis not present

## 2023-08-02 DIAGNOSIS — M79605 Pain in left leg: Secondary | ICD-10-CM | POA: Diagnosis not present

## 2023-08-02 LAB — CBC WITH DIFFERENTIAL/PLATELET
Abs Immature Granulocytes: 0.02 K/uL (ref 0.00–0.07)
Basophils Absolute: 0 K/uL (ref 0.0–0.1)
Basophils Relative: 1 %
Eosinophils Absolute: 0 K/uL (ref 0.0–0.5)
Eosinophils Relative: 0 %
HCT: 39.3 % (ref 39.0–52.0)
Hemoglobin: 12.2 g/dL — ABNORMAL LOW (ref 13.0–17.0)
Immature Granulocytes: 0 %
Lymphocytes Relative: 14 %
Lymphs Abs: 0.8 K/uL (ref 0.7–4.0)
MCH: 27.1 pg (ref 26.0–34.0)
MCHC: 31 g/dL (ref 30.0–36.0)
MCV: 87.3 fL (ref 80.0–100.0)
Monocytes Absolute: 0.4 K/uL (ref 0.1–1.0)
Monocytes Relative: 6 %
Neutro Abs: 4.8 K/uL (ref 1.7–7.7)
Neutrophils Relative %: 79 %
Platelets: 256 K/uL (ref 150–400)
RBC: 4.5 MIL/uL (ref 4.22–5.81)
RDW: 14.5 % (ref 11.5–15.5)
WBC: 6.1 K/uL (ref 4.0–10.5)
nRBC: 0 % (ref 0.0–0.2)

## 2023-08-02 LAB — COMPREHENSIVE METABOLIC PANEL WITH GFR
ALT: 16 U/L (ref 0–44)
AST: 20 U/L (ref 15–41)
Albumin: 3.2 g/dL — ABNORMAL LOW (ref 3.5–5.0)
Alkaline Phosphatase: 97 U/L (ref 38–126)
Anion gap: 10 (ref 5–15)
BUN: 27 mg/dL — ABNORMAL HIGH (ref 8–23)
CO2: 26 mmol/L (ref 22–32)
Calcium: 9.2 mg/dL (ref 8.9–10.3)
Chloride: 103 mmol/L (ref 98–111)
Creatinine, Ser: 1.74 mg/dL — ABNORMAL HIGH (ref 0.61–1.24)
GFR, Estimated: 41 mL/min — ABNORMAL LOW (ref >60)
Glucose, Bld: 114 mg/dL — ABNORMAL HIGH (ref 70–99)
Potassium: 4 mmol/L (ref 3.5–5.1)
Sodium: 139 mmol/L (ref 135–145)
Total Bilirubin: 0.9 mg/dL (ref 0.0–1.2)
Total Protein: 6.7 g/dL (ref 6.5–8.1)

## 2023-08-02 LAB — TROPONIN I (HIGH SENSITIVITY)
Troponin I (High Sensitivity): 8 ng/L (ref <18)
Troponin I (High Sensitivity): 8 ng/L (ref <18)

## 2023-08-02 LAB — LIPASE, BLOOD: Lipase: 39 U/L (ref 11–51)

## 2023-08-02 MED ORDER — IOHEXOL 350 MG/ML SOLN
80.0000 mL | Freq: Once | INTRAVENOUS | Status: AC | PRN
  Administered 2023-08-02: 80 mL via INTRAVENOUS

## 2023-08-02 MED ORDER — SODIUM CHLORIDE 0.9 % IV BOLUS
500.0000 mL | Freq: Once | INTRAVENOUS | Status: AC
  Administered 2023-08-02: 500 mL via INTRAVENOUS

## 2023-08-02 NOTE — Telephone Encounter (Signed)
 Called pt to schedule an appt for wed or thurs of next week and his vm is full not accepting messages.

## 2023-08-02 NOTE — ED Provider Notes (Signed)
 [facility] Provider Note    Event Date/Time   First MD Initiated Contact with Patient 08/02/23 1113     (approximate)   History   Leg Pain and Back Pain  Pt to ED for c/o pain in back, shoulders, legs. Pt states feeling a lot of gas in chest. Pt alert and oriented, ambulatory to triage.   HPI James Keller is a 73 y.o. male PMH stage IIIb adenocarcinoma of descending colon status post chemotherapy, DVT on Xarelto, right kidney mass presents for evaluation of vague back discomfort - Patient states for about 2-3 days he is having vague upper lower back discomfort that is like a sensation of gas.  Also notes this radiates to his bilateral shoulders.  Separately notes vague discomfort going down his left leg.  Denies any weakness.  No frank chest pain.  Has had constipation over the past several days but subsequently had a bowel movement yesterday. - No preceding trauma - No saddle anesthesia, lower extremity weakness, urinary or fecal incontinence or retention - Has had cough for the past 1-2 days, no fever       Physical Exam   Triage Vital Signs: ED Triage Vitals  Encounter Vitals Group     BP 08/02/23 0925 133/73     Girls Systolic BP Percentile --      Girls Diastolic BP Percentile --      Boys Systolic BP Percentile --      Boys Diastolic BP Percentile --      Pulse Rate 08/02/23 0925 62     Resp 08/02/23 0925 18     Temp 08/02/23 0925 98.1 F (36.7 C)     Temp Source 08/02/23 0925 Oral     SpO2 08/02/23 0925 100 %     Weight 08/02/23 0934 232 lb (105.2 kg)     Height 08/02/23 0934 6' 1 (1.854 m)     Head Circumference --      Peak Flow --      Pain Score 08/02/23 0932 10     Pain Loc --      Pain Education --      Exclude from Growth Chart --     Most recent vital signs: Vitals:   08/02/23 0925 08/02/23 1326  BP: 133/73 (!) 141/73  Pulse: 62 60  Resp: 18 18  Temp: 98.1 F (36.7 C) 98 F (36.7 C)  SpO2: 100% 99%    General: Awake, no distress.  CV:  Good peripheral perfusion. RRR, RP 2+ BUE Resp:  Normal effort. CTAB though mildly coarse breath sounds left midlung field Abd:  No distention. Nontender to deep palpation throughout, no pulsatile masses Neuro:  Face symmetric, normal strength all extremities, no apparent motor deficit appreciated Other:  No lower extremity edema appreciated   ED Results / Procedures / Treatments   Labs (all labs ordered are listed, but only abnormal results are displayed) Labs Reviewed  CBC WITH DIFFERENTIAL/PLATELET - Abnormal; Notable for the following components:      Result Value   Hemoglobin 12.2 (*)    All other components within normal limits  COMPREHENSIVE METABOLIC PANEL WITH GFR - Abnormal; Notable for the following components:   Glucose, Bld 114 (*)    BUN 27 (*)    Creatinine, Ser 1.74 (*)    Albumin 3.2 (*)    GFR, Estimated 41 (*)    All other components within normal limits  LIPASE, BLOOD  TROPONIN I (HIGH SENSITIVITY)  TROPONIN I (HIGH SENSITIVITY)     EKG  See ED course below.   RADIOLOGY See ED course below.  PROCEDURES:  Critical Care performed: No  Procedures   MEDICATIONS ORDERED IN ED: Medications  sodium chloride  0.9 % bolus 500 mL (0 mLs Intravenous Stopped 08/02/23 1327)  iohexol  (OMNIPAQUE ) 350 MG/ML injection 80 mL (80 mLs Intravenous Contrast Given 08/02/23 1217)     IMPRESSION / MDM / ASSESSMENT AND PLAN / ED COURSE  I reviewed the triage vital signs and the nursing notes.                              DDX/MDM/AP: Differential diagnosis includes, but is not limited to, ACS, pneumonia, doubt pneumothorax, consider MSK strain.  Given vague symptoms, consider aortic aneurysm, recurrence of malignancy.  Doubt bowel obstruction.  Do not clinically suspect acute spinal cord pathology at this time.  Plan: - EKG - Chest x-ray - Labs - Reassess  Patient's presentation is most consistent with acute presentation  with potential threat to life or bodily function.  The patient is on the cardiac monitor to evaluate for evidence of arrhythmia and/or significant heart rate changes.  ED course below.  Chest x-ray with widened mediastinum as well as possible left lower lobe pneumonia, escalated to CTA chest abdomen pelvis dissection protocol --notable for multiple areas of likely metastases including multiple bony lesions, multiple enlarged retroperitoneal and pelvic lymph nodes, multiple pulmonary nodules.  Did also visualize 4.3 cm infrarenal aortic aneurysm.  Patient appears asymptomatic from the aneurysm but I do suspect his multiple apparent metastases are likely contributing to his upper back discomfort.  Discussed findings in detail with patient--he is already established with an oncologist, and he will call the clinic today to schedule an appointment for further evaluation of his likely recurrent malignancy with metastases.  Plan for Tylenol  for pain as needed, denies need for any stronger pain medications.  Referral placed for vascular surgery for outpatient monitoring of his abdominal aortic aneurysm.  Also plan for PMD follow-up.  ED return precautions in place.  Patient agrees with plan.  Clinical Course as of 08/02/23 1533  Thu Aug 02, 2023  1117 CBC, CMP at baseline [MM]  1119 Ecg = sinus bradycardia, rate 57, no gross ST elevation or depression, biphasic T waves in leads V3-V5 (nonspecific), normal axis, normal intervals.  No clear evidence of arrhythmia nor ischemia on my interpretation. [MM]  1126 Chest x-ray interpreted by myself and radiology report reviewed.  Somewhat widened mediastinum.  Possible left lower lobe opacity.  Radiology report below.  IMPRESSION: 1. Hazy opacity at the left lung base is nonspecific but could reflect atelectasis or early bronchopneumonia. 2. Prominent right lateral contour of the ascending thoracic aorta could be due to tortuosity or ascending aortic aneurysm. This  could be further characterized with chest CT, if clinically warranted. 3. Mild lower thoracic spondylosis.   [MM]  1332 CTA chest: IMPRESSION: 1. There is fusiform aneurysmal dilation of infrarenal aorta measuring up to 4.2-4.3 cm in maximum diameter, as described in detail above. No evidence of dissection or acute aortic syndrome. 2. There is small to moderate amount of air in the right atrium, right ventricle and vessels in the lower neck, most likely secondary to introduction of air while obtaining an intravenous line. Correlate clinically/check connections to exclude continuous leak. 3. There are multiple sclerotic lesions throughout the imaged bones, compatible with metastases. No pathological fracture. 4.  There are also multiple enlarged retroperitoneal and pelvic lymph nodes, compatible with metastases. 5. There are multiple pulmonary nodules, as described above. These are indeterminate and may represent metastases. 6. Multiple other nonacute observations, as described above.   [MM]  1408 Rpt trop stable [MM]    Clinical Course User Index [MM] Clarine Ozell LABOR, MD     FINAL CLINICAL IMPRESSION(S) / ED DIAGNOSES   Final diagnoses:  Thoracic back pain, unspecified back pain laterality, unspecified chronicity  Malignancy (HCC)  Infrarenal abdominal aortic aneurysm (AAA) without rupture (HCC)     Rx / DC Orders   ED Discharge Orders     None        Note:  This document was prepared using Dragon voice recognition software and may include unintentional dictation errors.   Clarine Ozell LABOR, MD 08/02/23 [phone]

## 2023-08-02 NOTE — Discharge Instructions (Signed)
 Your evaluation in the emergency department today was concerning for a likely recurrence of cancer, and we are seeing a likely spread of this to multiple parts of your body open-it is very important that you follow-up closely with your oncologist for reevaluation.  As discussed, you also have a mildly enlarged abdominal aorta (blood vessel), I have placed a referral for you to follow-up in the vascular surgery clinic for this to be monitored moving forward--it does not require any intervention at this time.  Please do follow-up with your primary care doctor in addition, and return to the emergency department with any new or worsening symptoms.  You can continue to use Tylenol  as needed for any ongoing discomfort.

## 2023-08-02 NOTE — ED Triage Notes (Signed)
 Pt to ED for c/o pain in back, shoulders, legs. Pt states feeling a lot of gas in chest. Pt alert and oriented, ambulatory to triage.

## 2023-08-09 ENCOUNTER — Encounter: Payer: Self-pay | Admitting: Oncology

## 2023-08-09 ENCOUNTER — Inpatient Hospital Stay

## 2023-08-09 ENCOUNTER — Inpatient Hospital Stay: Attending: Oncology | Admitting: Oncology

## 2023-08-09 VITALS — BP 141/80 | HR 53 | Temp 97.8°F | Resp 18 | Ht 73.0 in | Wt 212.0 lb

## 2023-08-09 DIAGNOSIS — C186 Malignant neoplasm of descending colon: Secondary | ICD-10-CM

## 2023-08-09 DIAGNOSIS — Z85038 Personal history of other malignant neoplasm of large intestine: Secondary | ICD-10-CM | POA: Diagnosis not present

## 2023-08-09 DIAGNOSIS — F17210 Nicotine dependence, cigarettes, uncomplicated: Secondary | ICD-10-CM | POA: Diagnosis not present

## 2023-08-09 DIAGNOSIS — N189 Chronic kidney disease, unspecified: Secondary | ICD-10-CM | POA: Insufficient documentation

## 2023-08-09 DIAGNOSIS — C772 Secondary and unspecified malignant neoplasm of intra-abdominal lymph nodes: Secondary | ICD-10-CM

## 2023-08-09 DIAGNOSIS — D649 Anemia, unspecified: Secondary | ICD-10-CM | POA: Insufficient documentation

## 2023-08-09 DIAGNOSIS — Z86718 Personal history of other venous thrombosis and embolism: Secondary | ICD-10-CM | POA: Diagnosis not present

## 2023-08-09 DIAGNOSIS — Z9221 Personal history of antineoplastic chemotherapy: Secondary | ICD-10-CM | POA: Diagnosis not present

## 2023-08-09 DIAGNOSIS — R599 Enlarged lymph nodes, unspecified: Secondary | ICD-10-CM | POA: Insufficient documentation

## 2023-08-09 DIAGNOSIS — M899 Disorder of bone, unspecified: Secondary | ICD-10-CM | POA: Diagnosis present

## 2023-08-09 DIAGNOSIS — R918 Other nonspecific abnormal finding of lung field: Secondary | ICD-10-CM | POA: Insufficient documentation

## 2023-08-09 DIAGNOSIS — Z7901 Long term (current) use of anticoagulants: Secondary | ICD-10-CM | POA: Diagnosis not present

## 2023-08-09 DIAGNOSIS — Z79899 Other long term (current) drug therapy: Secondary | ICD-10-CM | POA: Insufficient documentation

## 2023-08-09 LAB — PSA: Prostatic Specific Antigen: 930.54 ng/mL — ABNORMAL HIGH (ref 0.00–4.00)

## 2023-08-09 NOTE — Progress Notes (Signed)
 James Keller  [phone]) [phone] Fax:(336) [phone]  ID: PRIMO INNIS OB: 1951/01/15  MR#: [id number]  RDW#:[id number]  Patient Care Team: [facility], [facility] as PCP - General Jinny Carmine, MD as Consulting Physician (Gastroenterology) Jacobo Evalene JINNY, MD as Consulting Physician (Oncology)  CHIEF COMPLAINT: Pelvic lymphadenopathy with lung and bone lesions concerning for malignancy.    INTERVAL HISTORY: Patient last seen in clinic nearly 2 years ago.  Recent CT scan on August 02, 2023 revealed multiple sclerotic lesions in bones compatible with metastasis.  He was also noted to have multiple enlarged retroperitoneal, pelvic lymph nodes, and multiple pulmonary nodules all suspicious for metastatic disease.  He is anxious, but otherwise feels well.  He has no neurologic complaints.  He does not complain of any pain.  He denies any weakness or fatigue.  He denies any recent fevers or illnesses.  He has a good appetite and denies weight loss.  He denies any chest pain, shortness of breath, cough, or hemoptysis.  He denies any nausea, vomiting, or diarrhea.  He has no changes in his bowel movements.  He denies any melena or hematochezia.  He has no urinary complaints.  Patient offers no specific complaints today.  REVIEW OF SYSTEMS:   Review of Systems  Constitutional: Negative.  Negative for fever, malaise/fatigue and weight loss.  Respiratory: Negative.  Negative for cough, hemoptysis and shortness of breath.   Cardiovascular: Negative.  Negative for chest pain and leg swelling.  Gastrointestinal: Negative.  Negative for abdominal pain, blood in stool, constipation and melena.  Genitourinary: Negative.  Negative for frequency, hematuria and urgency.  Musculoskeletal: Negative.  Negative for back pain.  Skin: Negative.  Negative for rash.  Neurological: Negative.  Negative for dizziness, sensory change, focal weakness, weakness and headaches.   Psychiatric/Behavioral:  The patient is nervous/anxious.     As per HPI. Otherwise, a complete review of systems is negative.  PAST MEDICAL HISTORY: Past Medical History:  Diagnosis Date   Colon cancer (HCC)    a. 11/2013 s/p colon resection/Hartmann's procedure;  b. Chemo: FOLFOX completed 07/2014.   DVT (deep venous thrombosis) (HCC)    RIGHT LEG/ HX OF   DVT (deep venous thrombosis) (HCC)    Dysrhythmia    Hypertension    Lower extremity edema    Rt leg   Neuromuscular disorder (HCC)    TINGLING IN FINGERS and feet   PAF (paroxysmal atrial fibrillation) (HCC)    S/P chemotherapy, time since less than 4 weeks    Tobacco abuse     PAST SURGICAL HISTORY: Past Surgical History:  Procedure Laterality Date   COLON RESECTION  12/19/2013   colostomy   COLON SURGERY     COLONOSCOPY WITH PROPOFOL  N/A 09/14/2014   Procedure: COLONOSCOPY WITH PROPOFOL  THROUGH COLOSTOMY AND COLONOSCOPY THRU RECTUM;  Surgeon: Carmine Jinny, MD;  Location: [facility];  Service: Endoscopy;  Laterality: N/A;  TRANSVERSE COLON POLYP AND SIGMOID COLON POLYP   COLONOSCOPY WITH PROPOFOL  N/A 11/29/2015   Procedure: COLONOSCOPY WITH PROPOFOL ;  Surgeon: Ruel Kung, MD;  Location: [facility];  Service: Endoscopy;  Laterality: N/A;   COLONOSCOPY WITH PROPOFOL  N/A 01/21/2019   Procedure: COLONOSCOPY WITH PROPOFOL ;  Surgeon: Jinny Carmine, MD;  Location: [facility];  Service: Endoscopy;  Laterality: N/A;   COLOSTOMY REVERSAL N/A 10/28/2014   Procedure: COLOSTOMY REVERSAL;  Surgeon: Lonni Brands, MD;  Location: [facility];  Service: General;  Laterality: N/A;   ESOPHAGOGASTRODUODENOSCOPY (EGD) WITH PROPOFOL  N/A 01/21/2019  Procedure: ESOPHAGOGASTRODUODENOSCOPY (EGD) WITH PROPOFOL ;  Surgeon: Jinny Carmine, MD;  Location: [facility];  Service: Endoscopy;  Laterality: N/A;   FEMUR FRACTURE SURGERY Right    FRACTURE SURGERY     LEG SURGERY     LYSIS OF ADHESION  10/28/2014   Procedure: LYSIS OF  ADHESION;  Surgeon: Lonni Brands, MD;  Location: [facility];  Service: General;;   PORT A CATH INJECTION ([facility] HX) Right    TUNNELED VENOUS PORT PLACEMENT      FAMILY HISTORY: Grandmother with liver cancer.     ADVANCED DIRECTIVES:    HEALTH MAINTENANCE: Social History   Tobacco Use   Smoking status: Every Day    Current packs/day: 0.50    Average packs/day: 0.5 packs/day for 54.0 years (27.0 ttl pk-yrs)    Types: Cigarettes   Smokeless tobacco: Never  Vaping Use   Vaping status: Never Used  Substance Use Topics   Alcohol use: Yes    Alcohol/week: 6.0 standard drinks of alcohol    Types: 6 Cans of beer per week    Comment: moderate beer   Drug use: Yes    Types: Marijuana    Comment: occasional     Colonoscopy:  PAP:  Bone density:  Lipid panel:  No Known Allergies   Current Outpatient Medications  Medication Sig Dispense Refill   amiodarone  (PACERONE ) 200 MG tablet Take 200 mg by mouth daily.      amLODipine (NORVASC) 10 MG tablet Take 1 tablet by mouth daily.     lisinopril (ZESTRIL) 40 MG tablet Take 40 mg by mouth daily.     metoprolol  tartrate (LOPRESSOR ) 25 MG tablet Take 25 mg by mouth 2 (two) times daily. Am and pm     phenazopyridine  (PYRIDIUM ) 95 MG tablet Take 1 tablet (95 mg total) by mouth 3 (three) times daily as needed for pain. 10 tablet 0   rivaroxaban (XARELTO) 20 MG TABS tablet Take 20 mg by mouth daily with supper.     amlodipine-atorvastatin (CADUET) 10-20 MG tablet Take 1 tablet by mouth daily.     amoxicillin-clavulanate (AUGMENTIN) 875-125 MG tablet Take 1 tablet by mouth 2 (two) times daily. (Patient not taking: Reported on 08/09/2023)     furosemide  (LASIX ) 20 MG tablet Take 0.5 tablets (10 mg total) by mouth daily. (Patient not taking: Reported on 08/09/2023) 10 tablet 0   gabapentin  (NEURONTIN ) 300 MG capsule Take 1 capsule (300 mg total) by mouth 3 (three) times daily. (Patient not taking: Reported on 08/09/2023) 90 capsule 1    lisinopril (PRINIVIL,ZESTRIL) 20 MG tablet Take 1 tablet by mouth daily. Reported on 08/12/2015 (Patient not taking: Reported on 08/09/2023)     nitrofurantoin , macrocrystal-monohydrate, (MACROBID ) 100 MG capsule Take 1 capsule (100 mg total) by mouth 2 (two) times daily. (Patient not taking: Reported on 08/09/2023) 14 capsule 0   No current facility-administered medications for this visit.   Facility-Administered Medications Ordered in Other Visits  Medication Dose Route Frequency Provider Last Rate Last Admin   heparin  lock flush 100 unit/mL  500 Units Intracatheter PRN Jacobo Evalene PARAS, MD       sodium chloride  0.9 % injection 10 mL  10 mL Intracatheter PRN Ronnett Pullin J, MD   10 mL at 10/19/14 1353   sodium chloride  flush (NS) 0.9 % injection 10 mL  10 mL Intracatheter PRN Jacobo Evalene PARAS, MD       sodium chloride  flush (NS) 0.9 % injection 10 mL  10 mL Intravenous PRN Jacobo,  Evalene PARAS, MD   10 mL at 08/13/17 1442    OBJECTIVE: Vitals:   08/09/23 1338  BP: (!) 141/80  Pulse: (!) 53  Resp: 18  Temp: 97.8 F (36.6 C)  SpO2: 100%      Body mass index is 27.97 kg/m.    ECOG FS:0 - Asymptomatic  General: Well-developed, well-nourished, no acute distress. Eyes: Pink conjunctiva, anicteric sclera. HEENT: Normocephalic, moist mucous membranes. Lungs: No audible wheezing or coughing. Heart: Regular rate and rhythm. Abdomen: Soft, nontender, no obvious distention. Musculoskeletal: No edema, cyanosis, or clubbing. Neuro: Alert, answering all questions appropriately. Cranial nerves grossly intact. Skin: No rashes or petechiae noted. Psych: Normal affect.  LAB RESULTS:  Lab Results  Component Value Date   NA 139 08/02/2023   K 4.0 08/02/2023   CL 103 08/02/2023   CO2 26 08/02/2023   GLUCOSE 114 (H) 08/02/2023   BUN 27 (H) 08/02/2023   CREATININE 1.74 (H) 08/02/2023   CALCIUM  9.2 08/02/2023   PROT 6.7 08/02/2023   ALBUMIN 3.2 (L) 08/02/2023   AST 20 08/02/2023    ALT 16 08/02/2023   ALKPHOS 97 08/02/2023   BILITOT 0.9 08/02/2023   GFRNONAA 41 (L) 08/02/2023   GFRAA 45 (L) 05/29/2019    Lab Results  Component Value Date   WBC 6.1 08/02/2023   NEUTROABS 4.8 08/02/2023   HGB 12.2 (L) 08/02/2023   HCT 39.3 08/02/2023   MCV 87.3 08/02/2023   PLT 256 08/02/2023    STUDIES:    CT Angio Chest/Abd/Pel for Dissection W and/or Wo Contrast Result Date: 08/02/2023 CLINICAL DATA:  Back discomfort (upper and lower), abnormal chest x-ray --eval for aortic aneurysm/dissection, back pathology. Also notes recent constipation, eval for obstruction. History of colon cancer and renal mass, eval for recurrence. * Tracking Code: BO * EXAM: CT ANGIOGRAPHY CHEST, ABDOMEN AND PELVIS TECHNIQUE: Non-contrast CT of the chest was initially obtained. Multidetector CT imaging through the chest, abdomen and pelvis was performed using the standard protocol during bolus administration of intravenous contrast. Multiplanar reconstructed images and MIPs were obtained and reviewed to evaluate the vascular anatomy. RADIATION DOSE REDUCTION: This exam was performed according to the departmental dose-optimization program which includes automated exposure control, adjustment of the mA and/or kV according to patient size and/or use of iterative reconstruction technique. CONTRAST:  80mL OMNIPAQUE  IOHEXOL  350 MG/ML SOLN COMPARISON:  MRI abdomen from 08/17/2021 and CT scan abdomen and pelvis from 09/08/2015. FINDINGS: CTA CHEST FINDINGS Cardiovascular: No intramural hematoma noted in the thoracic aorta on the unenhanced images. Thoracic aorta is normal in caliber without aneurysm, dissection, vasculitis or significant stenosis. Normal cardiac size. No pericardial effusion. There is small-to-moderate amount of air within the right ventricle and in the main pulmonary trunk. There also small scattered foci of air in the soft tissue around the lower neck (marked with electronic arrow sign on series 6) and  in the right atrium. Findings are likely secondary to introduction of air while obtaining an intravenous line. Correlate clinically for continuous leak. No aortic aneurysm. There are coronary artery calcifications, in keeping with coronary artery disease. There are also mild-to-moderate peripheral atherosclerotic vascular calcifications of thoracic aorta and its major branches. Mediastinum/Nodes: Visualized thyroid gland appears grossly unremarkable. No solid / cystic mediastinal masses. The esophagus is nondistended precluding optimal assessment. No axillary, mediastinal or hilar lymphadenopathy by size criteria. Lungs/Pleura: The central tracheo-bronchial tree is patent. There is mild, smooth, circumferential thickening of the segmental and subsegmental bronchial walls, throughout bilateral lungs, which is  nonspecific. Findings are most commonly seen with bronchitis or reactive airway disease, such as asthma. There are patchy areas of linear, plate-like atelectasis and/or scarring throughout bilateral lungs. No mass or consolidation. No pleural effusion or pneumothorax. There are multiple (more than 20), nodules throughout bilateral lungs (marked with electronic arrow sign on series 2006) with largest pleural-based nodule in the right lung lower lobe, posterior inferiorly measuring up to 5 x 7 mm. The largest nodule is new since the prior study from 2017. Please see follow-up recommendations below. Musculoskeletal: The visualized soft tissues of the chest wall are grossly unremarkable. There are multiple sclerotic lesion (ranging in size from few mm up to 1.5 cm) throughout the imaged bones, compatible with sclerotic metastases. No pathological fracture. There are mild to moderate multilevel degenerative changes in the visualized spine. Review of the MIP images confirms the above findings. CTA ABDOMEN AND PELVIS FINDINGS VASCULAR Aorta: Mild-to-moderate peripheral atherosclerotic vascular calcifications noted.  There is fusiform aneurysmal dilation of infrarenal aorta measuring up to 6.4 cm in craniocaudal length. The maximum diameter of the lumen is up to 4.2-4.3 cm. There is peripheral/eccentric intraluminal thrombus/hematoma extending from 1:00 to 7:00 occupying approximately 25% of the cross-sectional surface area. Celiac: Patent without evidence of aneurysm, dissection, vasculitis or significant stenosis. SMA: Patent without evidence of aneurysm, dissection, vasculitis or significant stenosis. Renals: There are 2 right and 1 left renal arteries. The right-sided accessory renal artery enters the kidney through the hilum. Bilateral renal arteries are patent without evidence of aneurysm, dissection, vasculitis, fibromuscular dysplasia or significant stenosis. IMA: Patent without evidence of aneurysm, dissection, vasculitis or significant stenosis. Inflow: Patent without evidence of aneurysm, dissection, vasculitis or significant stenosis. Veins: No obvious venous abnormality within the limitations of this arterial phase study. Review of the MIP images confirms the above findings. NON-VASCULAR Hepatobiliary: The liver is normal in size. Non-cirrhotic configuration. No suspicious mass. No intrahepatic or extrahepatic bile duct dilation. No calcified gallstones. Normal gallbladder wall thickness. No pericholecystic inflammatory changes. Pancreas: Unremarkable. No pancreatic ductal dilatation or surrounding inflammatory changes. Spleen: Within normal limits. No focal lesion. Adrenals/Urinary Tract: Redemonstration of right adrenal nodule measuring 1.2 x 2.0 cm, which was characterized as an adenoma on the prior exam. However, there is new slightly irregular soft tissue nodule adjacent to/inseparable from the inferior aspect of the left adrenal gland, which is favored to represent periadrenal lymph node. No suspicious renal mass. There is a 2.0 x 2.9 cm cyst arising from the right kidney lower pole, posterolaterally and a 1.6  x 1.7 cm simple cyst arising from the left kidney upper pole, posteromedially. There is a 1-1.5 cm calcified focus in the left kidney upper pole cortex. No nephroureterolithiasis or obstructive uropathy on either side. Urinary bladder is under distended, precluding optimal assessment. However, no large mass or stones identified. No perivesical fat stranding. Stomach/Bowel: Focal colonic resection with colocolonic anastomosis noted in the left upper quadrant. No disproportionate dilation of the small or large bowel loops. No evidence of abnormal bowel wall thickening or inflammatory changes. The appendix is unremarkable. Vascular/Lymphatic: No ascites or pneumoperitoneum. There multiple enlarged retroperitoneal and pelvic lymph nodes with largest right common iliac group of lymph node measuring up to 2.2 x 2.4 x 3.9 cm. Largest pelvic lymph node is a right pelvic sidewall lymph node measuring 2.0 x 2.2 cm. These are compatible with metastases. No aneurysmal dilation of the major abdominal arteries. There are moderate peripheral atherosclerotic vascular calcifications of the aorta and its major branches. Reproductive: Normal  size prostate. Symmetric seminal vesicles. Other: There is a small periumbilical hernia containing portion of unobstructed bowel loop. There also tiny bilateral fat containing inguinal hernias. The soft tissues and abdominal wall are otherwise unremarkable. Musculoskeletal: There are extensive sclerotic lesion throughout the bones, compatible with metastases. No pathological fracture. Intramedullary nail noted in the proximal right femur. There are mild - moderate multilevel degenerative changes in the visualized spine. Review of the MIP images confirms the above findings. IMPRESSION: 1. There is fusiform aneurysmal dilation of infrarenal aorta measuring up to 4.2-4.3 cm in maximum diameter, as described in detail above. No evidence of dissection or acute aortic syndrome. 2. There is small to  moderate amount of air in the right atrium, right ventricle and vessels in the lower neck, most likely secondary to introduction of air while obtaining an intravenous line. Correlate clinically/check connections to exclude continuous leak. 3. There are multiple sclerotic lesions throughout the imaged bones, compatible with metastases. No pathological fracture. 4. There are also multiple enlarged retroperitoneal and pelvic lymph nodes, compatible with metastases. 5. There are multiple pulmonary nodules, as described above. These are indeterminate and may represent metastases. 6. Multiple other nonacute observations, as described above. Aortic aneurysm NOS (ICD10-I71.9). Aortic Atherosclerosis (ICD10-I70.0). Electronically Signed   By: Ree Molt M.D.   On: 08/02/2023 12:56   DG Chest 2 View Result Date: 08/02/2023 CLINICAL DATA:  Chest pressure, with back, shoulder, and leg pain. EXAM: CHEST - 2 VIEW COMPARISON:  11/04/2014 FINDINGS: Hazy opacity at the left lung base is nonspecific but could reflect atelectasis or early bronchopneumonia. The lungs appear otherwise clear. Cardiac and mediastinal margins appear normal. No blunting of the costophrenic angles. Prominent right lateral contour of the ascending thoracic aorta could be due to tortuosity or ascending aortic aneurysm. Mild lower thoracic spondylosis. IMPRESSION: 1. Hazy opacity at the left lung base is nonspecific but could reflect atelectasis or early bronchopneumonia. 2. Prominent right lateral contour of the ascending thoracic aorta could be due to tortuosity or ascending aortic aneurysm. This could be further characterized with chest CT, if clinically warranted. 3. Mild lower thoracic spondylosis. Electronically Signed   By: Ryan Salvage M.D.   On: 08/02/2023 10:24    ASSESSMENT: Pelvic lymphadenopathy with lung and bone lesions concerning for malignancy.    PLAN:    Pelvic lymphadenopathy with lung and bone lesions concerning for  malignancy: CT scan results from August 02, 2023 reviewed independently and reported as above.  Pattern of spread does not suggest a recurrence of his history of colon cancer, but this is unclear.  Will get a CEA as well as a PSA for completeness today.  Will get a PET scan to further evaluate.  If PET positive, patient will likely require biopsy to confirm diagnosis.  Return to clinic 1 week after his PET scan to discuss the results. 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  Previously, patient CEA was chronically elevated and his most recent result from 2 years ago was 7.0. CEA ranged from 4.6-8.1 since at least October 2018.  He had a colonoscopy and upper endoscopy on January 21, 2019 that did not reveal any significant pathology or evidence of recurrent disease.  No further interventions are needed.   H/o DVT: Patient has discontinued Coumadin  and is now on Xarelto.  Managed by patient's primary care. Chronic renal insufficiency: Patient's most recent GFR is 41 which appears better than his  baseline.  Monitor.   Anemia: Mild.  Patient's most recent hemoglobin is 12.2. Neuropathy: Patient does not complain of this today.    Patient expressed understanding and was in agreement with this plan. He also understands that He can call clinic at any time with any questions, concerns, or complaints.   Colon cancer   Staging form: Colon/Rectum, AJCC 7th Edition     Pathologic stage from 05/04/2014: Stage IIIB (T4, N1, M0) - Signed by Evalene JINNY Reusing, MD on 05/04/2014   Evalene JINNY Reusing, MD   08/09/2023 2:55 PM

## 2023-08-09 NOTE — Progress Notes (Signed)
 Patient was recently in the hospital, they did some scans, CT and DG and told him that he had cancer that was spreading. He isn't having any symptoms as of right now.

## 2023-08-10 ENCOUNTER — Other Ambulatory Visit: Payer: Self-pay | Admitting: *Deleted

## 2023-08-10 DIAGNOSIS — R972 Elevated prostate specific antigen [PSA]: Secondary | ICD-10-CM

## 2023-08-10 LAB — CEA: CEA: 6.3 ng/mL — ABNORMAL HIGH (ref 0.0–4.7)

## 2023-08-15 ENCOUNTER — Ambulatory Visit
Admission: RE | Admit: 2023-08-15 | Discharge: 2023-08-15 | Disposition: A | Discharge status: Home / Self Care | Source: Ambulatory Visit | Attending: Oncology | Admitting: Oncology

## 2023-08-15 DIAGNOSIS — C7951 Secondary malignant neoplasm of bone: Secondary | ICD-10-CM | POA: Insufficient documentation

## 2023-08-15 DIAGNOSIS — R972 Elevated prostate specific antigen [PSA]: Secondary | ICD-10-CM

## 2023-08-15 DIAGNOSIS — R9721 Rising PSA following treatment for malignant neoplasm of prostate: Secondary | ICD-10-CM | POA: Diagnosis not present

## 2023-08-15 DIAGNOSIS — C61 Malignant neoplasm of prostate: Secondary | ICD-10-CM | POA: Diagnosis present

## 2023-08-15 DIAGNOSIS — R918 Other nonspecific abnormal finding of lung field: Secondary | ICD-10-CM | POA: Diagnosis not present

## 2023-08-15 DIAGNOSIS — C775 Secondary and unspecified malignant neoplasm of intrapelvic lymph nodes: Secondary | ICD-10-CM | POA: Insufficient documentation

## 2023-08-15 MED ORDER — FLOTUFOLASTAT F 18 GALLIUM 296-5846 MBQ/ML IV SOLN
8.1200 | Freq: Once | INTRAVENOUS | Status: AC
  Administered 2023-08-15: 8.12 via INTRAVENOUS
  Filled 2023-08-15: qty 9

## 2023-08-17 ENCOUNTER — Ambulatory Visit

## 2023-08-27 ENCOUNTER — Telehealth: Payer: Self-pay | Admitting: Pharmacist

## 2023-08-27 ENCOUNTER — Encounter: Payer: Self-pay | Admitting: Oncology

## 2023-08-27 ENCOUNTER — Inpatient Hospital Stay: Attending: Oncology | Admitting: Oncology

## 2023-08-27 ENCOUNTER — Other Ambulatory Visit: Payer: Self-pay | Admitting: Pharmacist

## 2023-08-27 ENCOUNTER — Inpatient Hospital Stay: Admitting: Pharmacist

## 2023-08-27 ENCOUNTER — Other Ambulatory Visit (HOSPITAL_COMMUNITY): Payer: Self-pay

## 2023-08-27 VITALS — BP 128/78 | HR 60 | Temp 97.9°F | Wt 206.2 lb

## 2023-08-27 DIAGNOSIS — Z808 Family history of malignant neoplasm of other organs or systems: Secondary | ICD-10-CM | POA: Diagnosis not present

## 2023-08-27 DIAGNOSIS — Z5111 Encounter for antineoplastic chemotherapy: Secondary | ICD-10-CM | POA: Diagnosis present

## 2023-08-27 DIAGNOSIS — D649 Anemia, unspecified: Secondary | ICD-10-CM | POA: Diagnosis not present

## 2023-08-27 DIAGNOSIS — C61 Malignant neoplasm of prostate: Secondary | ICD-10-CM

## 2023-08-27 DIAGNOSIS — Z7901 Long term (current) use of anticoagulants: Secondary | ICD-10-CM | POA: Diagnosis not present

## 2023-08-27 DIAGNOSIS — N189 Chronic kidney disease, unspecified: Secondary | ICD-10-CM | POA: Insufficient documentation

## 2023-08-27 DIAGNOSIS — Z79899 Other long term (current) drug therapy: Secondary | ICD-10-CM | POA: Diagnosis not present

## 2023-08-27 DIAGNOSIS — C186 Malignant neoplasm of descending colon: Secondary | ICD-10-CM

## 2023-08-27 DIAGNOSIS — C772 Secondary and unspecified malignant neoplasm of intra-abdominal lymph nodes: Secondary | ICD-10-CM

## 2023-08-27 DIAGNOSIS — Z86718 Personal history of other venous thrombosis and embolism: Secondary | ICD-10-CM | POA: Diagnosis not present

## 2023-08-27 DIAGNOSIS — R972 Elevated prostate specific antigen [PSA]: Secondary | ICD-10-CM

## 2023-08-27 DIAGNOSIS — Z85038 Personal history of other malignant neoplasm of large intestine: Secondary | ICD-10-CM | POA: Insufficient documentation

## 2023-08-27 DIAGNOSIS — R590 Localized enlarged lymph nodes: Secondary | ICD-10-CM | POA: Diagnosis not present

## 2023-08-27 DIAGNOSIS — C7951 Secondary malignant neoplasm of bone: Secondary | ICD-10-CM | POA: Insufficient documentation

## 2023-08-27 DIAGNOSIS — F17210 Nicotine dependence, cigarettes, uncomplicated: Secondary | ICD-10-CM | POA: Diagnosis not present

## 2023-08-27 DIAGNOSIS — R599 Enlarged lymph nodes, unspecified: Secondary | ICD-10-CM | POA: Diagnosis not present

## 2023-08-27 NOTE — Progress Notes (Unsigned)
 [facility]  [phone] [phone]  ID: James Keller OB: 1950/03/18  MR#: [id number]  RDW#:[id number]  Patient Care Team: Center, [facility] as PCP - General Jinny Carmine, MD as Consulting Physician (Gastroenterology) Jacobo Evalene JINNY, MD as Consulting Physician (Oncology)  CHIEF COMPLAINT: Stage IVb prostate cancer.  INTERVAL HISTORY: Patient returns to clinic today for further evaluation, discussion of his laboratory and imaging results, and treatment planning. He continues to feel well and remains asymptomatic.  He has no neurologic complaints.  He does not complain of any pain.  He denies any weakness or fatigue.  He denies any recent fevers or illnesses.  He has a good appetite and denies weight loss.  He denies any chest pain, shortness of breath, cough, or hemoptysis.  He denies any nausea, vomiting, or diarrhea.  He has no changes in his bowel movements.  He denies any melena or hematochezia.  He has no urinary complaints.  Patient offers no specific complaints today.  REVIEW OF SYSTEMS:   Review of Systems  Constitutional: Negative.  Negative for fever, malaise/fatigue and weight loss.  Respiratory: Negative.  Negative for cough, hemoptysis and shortness of breath.   Cardiovascular: Negative.  Negative for chest pain and leg swelling.  Gastrointestinal: Negative.  Negative for abdominal pain, blood in stool, constipation and melena.  Genitourinary: Negative.  Negative for frequency, hematuria and urgency.  Musculoskeletal: Negative.  Negative for back pain.  Skin: Negative.  Negative for rash.  Neurological: Negative.  Negative for dizziness, sensory change, focal weakness, weakness and headaches.  Psychiatric/Behavioral: Negative.  The patient is not nervous/anxious.     As per HPI. Otherwise, a complete review of systems is negative.  PAST MEDICAL HISTORY: Past Medical History:  Diagnosis Date   Colon cancer  (HCC)    a. 11/2013 s/p colon resection/Hartmann's procedure;  b. Chemo: FOLFOX completed 07/2014.   DVT (deep venous thrombosis) (HCC)    RIGHT LEG/ HX OF   DVT (deep venous thrombosis) (HCC)    Dysrhythmia    Hypertension    Lower extremity edema    Rt leg   Neuromuscular disorder (HCC)    TINGLING IN FINGERS and feet   PAF (paroxysmal atrial fibrillation) (HCC)    S/P chemotherapy, time since less than 4 weeks    Tobacco abuse     PAST SURGICAL HISTORY: Past Surgical History:  Procedure Laterality Date   COLON RESECTION  12/19/2013   colostomy   COLON SURGERY     COLONOSCOPY WITH PROPOFOL  N/A 09/14/2014   Procedure: COLONOSCOPY WITH PROPOFOL  THROUGH COLOSTOMY AND COLONOSCOPY THRU RECTUM;  Surgeon: Carmine Jinny, MD;  Location: [facility];  Service: Endoscopy;  Laterality: N/A;  TRANSVERSE COLON POLYP AND SIGMOID COLON POLYP   COLONOSCOPY WITH PROPOFOL  N/A 11/29/2015   Procedure: COLONOSCOPY WITH PROPOFOL ;  Surgeon: Ruel Kung, MD;  Location: [facility] ENDOSCOPY;  Service: Endoscopy;  Laterality: N/A;   COLONOSCOPY WITH PROPOFOL  N/A 01/21/2019   Procedure: COLONOSCOPY WITH PROPOFOL ;  Surgeon: Jinny Carmine, MD;  Location: [facility] ENDOSCOPY;  Service: Endoscopy;  Laterality: N/A;   COLOSTOMY REVERSAL N/A 10/28/2014   Procedure: COLOSTOMY REVERSAL;  Surgeon: Lonni Brands, MD;  Location: [facility] ORS;  Service: General;  Laterality: N/A;   ESOPHAGOGASTRODUODENOSCOPY (EGD) WITH PROPOFOL  N/A 01/21/2019   Procedure: ESOPHAGOGASTRODUODENOSCOPY (EGD) WITH PROPOFOL ;  Surgeon: Jinny Carmine, MD;  Location: [facility] ENDOSCOPY;  Service: Endoscopy;  Laterality: N/A;   FEMUR FRACTURE SURGERY Right    FRACTURE SURGERY     LEG  SURGERY     LYSIS OF ADHESION  10/28/2014   Procedure: LYSIS OF ADHESION;  Surgeon: Lonni Brands, MD;  Location: [facility] ORS;  Service: General;;   PORT A CATH INJECTION ([facility] HX) Right    TUNNELED VENOUS PORT PLACEMENT      FAMILY HISTORY: Grandmother with liver  cancer.     ADVANCED DIRECTIVES:    HEALTH MAINTENANCE: Social History   Tobacco Use   Smoking status: Every Day    Current packs/day: 0.50    Average packs/day: 0.5 packs/day for 54.0 years (27.0 ttl pk-yrs)    Types: Cigarettes   Smokeless tobacco: Never  Vaping Use   Vaping status: Never Used  Substance Use Topics   Alcohol use: Yes    Alcohol/week: 6.0 standard drinks of alcohol    Types: 6 Cans of beer per week    Comment: moderate beer   Drug use: Yes    Types: Marijuana    Comment: occasional     Colonoscopy:  PAP:  Bone density:  Lipid panel:  No Known Allergies   Current Outpatient Medications  Medication Sig Dispense Refill   amiodarone  (PACERONE ) 200 MG tablet Take 200 mg by mouth daily.      amLODipine (NORVASC) 10 MG tablet Take 1 tablet by mouth daily.     amlodipine-atorvastatin (CADUET) 10-20 MG tablet Take 1 tablet by mouth daily.     gabapentin  (NEURONTIN ) 300 MG capsule Take 1 capsule (300 mg total) by mouth 3 (three) times daily. 90 capsule 1   lisinopril (PRINIVIL,ZESTRIL) 20 MG tablet Take 1 tablet by mouth daily. Reported on 08/12/2015     lisinopril (ZESTRIL) 40 MG tablet Take 40 mg by mouth daily.     metoprolol  tartrate (LOPRESSOR ) 25 MG tablet Take 25 mg by mouth 2 (two) times daily. Am and pm     rivaroxaban (XARELTO) 20 MG TABS tablet Take 20 mg by mouth daily with supper.     phenazopyridine  (PYRIDIUM ) 95 MG tablet Take 1 tablet (95 mg total) by mouth 3 (three) times daily as needed for pain. 10 tablet 0   No current facility-administered medications for this visit.   Facility-Administered Medications Ordered in Other Visits  Medication Dose Route Frequency Provider Last Rate Last Admin   heparin  lock flush 100 unit/mL  500 Units Intracatheter PRN Jacobo Evalene PARAS, MD       sodium chloride  0.9 % injection 10 mL  10 mL Intracatheter PRN Wilber Fini J, MD   10 mL at 10/19/14 1353   sodium chloride  flush (NS) 0.9 % injection 10  mL  10 mL Intracatheter PRN Jacobo Evalene PARAS, MD       sodium chloride  flush (NS) 0.9 % injection 10 mL  10 mL Intravenous PRN Livvy Spilman J, MD   10 mL at 08/13/17 1442    OBJECTIVE: Vitals:   08/27/23 1330  BP: 128/78  Pulse: 60  Temp: 97.9 F (36.6 C)  SpO2: 100%       Body mass index is 27.2 kg/m.    ECOG FS:0 - Asymptomatic  General: Well-developed, well-nourished, no acute distress. Eyes: Pink conjunctiva, anicteric sclera. HEENT: Normocephalic, moist mucous membranes. Lungs: No audible wheezing or coughing. Heart: Regular rate and rhythm. Abdomen: Soft, nontender, no obvious distention. Musculoskeletal: No edema, cyanosis, or clubbing. Neuro: Alert, answering all questions appropriately. Cranial nerves grossly intact. Skin: No rashes or petechiae noted. Psych: Normal affect.   LAB RESULTS:  Lab Results  Component Value Date   NA  139 08/02/2023   K 4.0 08/02/2023   CL 103 08/02/2023   CO2 26 08/02/2023   GLUCOSE 114 (H) 08/02/2023   BUN 27 (H) 08/02/2023   CREATININE 1.74 (H) 08/02/2023   CALCIUM  9.2 08/02/2023   PROT 6.7 08/02/2023   ALBUMIN 3.2 (L) 08/02/2023   AST 20 08/02/2023   ALT 16 08/02/2023   ALKPHOS 97 08/02/2023   BILITOT 0.9 08/02/2023   GFRNONAA 41 (L) 08/02/2023   GFRAA 45 (L) 05/29/2019    Lab Results  Component Value Date   WBC 6.1 08/02/2023   NEUTROABS 4.8 08/02/2023   HGB 12.2 (L) 08/02/2023   HCT 39.3 08/02/2023   MCV 87.3 08/02/2023   PLT 256 08/02/2023    STUDIES:    NM PET (PSMA) SKULL TO MID THIGH Result Date: 08/16/2023 CLINICAL DATA:  Prostate carcinoma with biochemical recurrence. EXAM: NUCLEAR MEDICINE PET SKULL BASE TO THIGH TECHNIQUE: 8.1 mCi Flotufolastat (Posluma ) was injected intravenously. Full-ring PET imaging was performed from the skull base to thigh after the radiotracer. CT data was obtained and used for attenuation correction and anatomic localization. COMPARISON:  None Available. FINDINGS: NECK No  radiotracer activity in neck lymph nodes. Incidental CT finding: None. CHEST No supraclavicular radiotracer avid lymph nodes. One prevascular lymph node is intensely radiotracer avid measuring 9 mm on image 55 There scattered small pulmonary nodules without clear radiotracer activity. For example nodule in superior segment of the LEFT lower lobe measuring 6 mm on image 52. Lingular nodule on image 64 without radiotracer active. Incidental CT finding: None. ABDOMEN/PELVIS Prostate: Intense activity within the prostate gland with SUV max equal 31.7 along the posterior gland. Concern for carcinoma extension into the base of the LEFT and RIGHT Seminal vesicles on 30/8 Lymph nodes: Enlarged radiotracer avid iliac lymph nodes are present. Example RIGHT iliac lymph node adjacent to the operator space measuring 20 mm with SUV max equal 34.5 Node at the confluence of the common iliac veins with SUV max equal 43 on image 119. Intense radiotracer avid lymph node just ventral to the IVC on image 113 Liver: No evidence of liver metastasis. Incidental CT finding: None. SKELETON There is multifocal skeletal metastasis. Lesions are too numerous to count. Lesions involve the axillary and appendicular skeleton. Example lesion in the LEFT acetabulum SUV max equal 13.6. Example lesion in the midshaft LEFT femur with SUV max equal 11.2. Essentially every vertebral body is involved with radiotracer avid metastasis. Example lesion at L1 with SUV max equal 19 lesions involve multiple ribs initial girdles. There multiple sclerotic lesions within the skeleton associated with the radiotracer avidity. IMPRESSION: 1. Intense radiotracer activity within the prostate gland consistent with primary prostate adenocarcinoma. Concern for extension into the base of the seminal vesicles. 2. Intense radiotracer avid metastatic adenopathy in the pelvis. 3. Intense radiotracer avid metastatic adenopathy in the retroperitoneum. 4. Intense radiotracer avid  prevascular lymph node in the chest is concerning for nodal metastasis. 5. Multifocal skeletal metastasis involving the axial and appendicular skeleton. Lesions are too numerous to count. 6. Small pulmonary nodules without radiotracer activity are indeterminate. Electronically Signed   By: Jackquline Boxer M.D.   On: 08/16/2023 12:58   CT Angio Chest/Abd/Pel for Dissection W and/or Wo Contrast Result Date: 08/02/2023 CLINICAL DATA:  Back discomfort (upper and lower), abnormal chest x-ray --eval for aortic aneurysm/dissection, back pathology. Also notes recent constipation, eval for obstruction. History of colon cancer and renal mass, eval for recurrence. * Tracking Code: BO * EXAM: CT ANGIOGRAPHY CHEST,  ABDOMEN AND PELVIS TECHNIQUE: Non-contrast CT of the chest was initially obtained. Multidetector CT imaging through the chest, abdomen and pelvis was performed using the standard protocol during bolus administration of intravenous contrast. Multiplanar reconstructed images and MIPs were obtained and reviewed to evaluate the vascular anatomy. RADIATION DOSE REDUCTION: This exam was performed according to the departmental dose-optimization program which includes automated exposure control, adjustment of the mA and/or kV according to patient size and/or use of iterative reconstruction technique. CONTRAST:  80mL OMNIPAQUE  IOHEXOL  350 MG/ML SOLN COMPARISON:  MRI abdomen from 08/17/2021 and CT scan abdomen and pelvis from 09/08/2015. FINDINGS: CTA CHEST FINDINGS Cardiovascular: No intramural hematoma noted in the thoracic aorta on the unenhanced images. Thoracic aorta is normal in caliber without aneurysm, dissection, vasculitis or significant stenosis. Normal cardiac size. No pericardial effusion. There is small-to-moderate amount of air within the right ventricle and in the main pulmonary trunk. There also small scattered foci of air in the soft tissue around the lower neck (marked with electronic arrow sign on series  6) and in the right atrium. Findings are likely secondary to introduction of air while obtaining an intravenous line. Correlate clinically for continuous leak. No aortic aneurysm. There are coronary artery calcifications, in keeping with coronary artery disease. There are also mild-to-moderate peripheral atherosclerotic vascular calcifications of thoracic aorta and its major branches. Mediastinum/Nodes: Visualized thyroid gland appears grossly unremarkable. No solid / cystic mediastinal masses. The esophagus is nondistended precluding optimal assessment. No axillary, mediastinal or hilar lymphadenopathy by size criteria. Lungs/Pleura: The central tracheo-bronchial tree is patent. There is mild, smooth, circumferential thickening of the segmental and subsegmental bronchial walls, throughout bilateral lungs, which is nonspecific. Findings are most commonly seen with bronchitis or reactive airway disease, such as asthma. There are patchy areas of linear, plate-like atelectasis and/or scarring throughout bilateral lungs. No mass or consolidation. No pleural effusion or pneumothorax. There are multiple (more than 20), nodules throughout bilateral lungs (marked with electronic arrow sign on series 2006) with largest pleural-based nodule in the right lung lower lobe, posterior inferiorly measuring up to 5 x 7 mm. The largest nodule is new since the prior study from 2017. Please see follow-up recommendations below. Musculoskeletal: The visualized soft tissues of the chest wall are grossly unremarkable. There are multiple sclerotic lesion (ranging in size from few mm up to 1.5 cm) throughout the imaged bones, compatible with sclerotic metastases. No pathological fracture. There are mild to moderate multilevel degenerative changes in the visualized spine. Review of the MIP images confirms the above findings. CTA ABDOMEN AND PELVIS FINDINGS VASCULAR Aorta: Mild-to-moderate peripheral atherosclerotic vascular calcifications  noted. There is fusiform aneurysmal dilation of infrarenal aorta measuring up to 6.4 cm in craniocaudal length. The maximum diameter of the lumen is up to 4.2-4.3 cm. There is peripheral/eccentric intraluminal thrombus/hematoma extending from 1:00 to 7:00 occupying approximately 25% of the cross-sectional surface area. Celiac: Patent without evidence of aneurysm, dissection, vasculitis or significant stenosis. SMA: Patent without evidence of aneurysm, dissection, vasculitis or significant stenosis. Renals: There are 2 right and 1 left renal arteries. The right-sided accessory renal artery enters the kidney through the hilum. Bilateral renal arteries are patent without evidence of aneurysm, dissection, vasculitis, fibromuscular dysplasia or significant stenosis. IMA: Patent without evidence of aneurysm, dissection, vasculitis or significant stenosis. Inflow: Patent without evidence of aneurysm, dissection, vasculitis or significant stenosis. Veins: No obvious venous abnormality within the limitations of this arterial phase study. Review of the MIP images confirms the above findings. NON-VASCULAR Hepatobiliary: The liver  is normal in size. Non-cirrhotic configuration. No suspicious mass. No intrahepatic or extrahepatic bile duct dilation. No calcified gallstones. Normal gallbladder wall thickness. No pericholecystic inflammatory changes. Pancreas: Unremarkable. No pancreatic ductal dilatation or surrounding inflammatory changes. Spleen: Within normal limits. No focal lesion. Adrenals/Urinary Tract: Redemonstration of right adrenal nodule measuring 1.2 x 2.0 cm, which was characterized as an adenoma on the prior exam. However, there is new slightly irregular soft tissue nodule adjacent to/inseparable from the inferior aspect of the left adrenal gland, which is favored to represent periadrenal lymph node. No suspicious renal mass. There is a 2.0 x 2.9 cm cyst arising from the right kidney lower pole, posterolaterally  and a 1.6 x 1.7 cm simple cyst arising from the left kidney upper pole, posteromedially. There is a 1-1.5 cm calcified focus in the left kidney upper pole cortex. No nephroureterolithiasis or obstructive uropathy on either side. Urinary bladder is under distended, precluding optimal assessment. However, no large mass or stones identified. No perivesical fat stranding. Stomach/Bowel: Focal colonic resection with colocolonic anastomosis noted in the left upper quadrant. No disproportionate dilation of the small or large bowel loops. No evidence of abnormal bowel wall thickening or inflammatory changes. The appendix is unremarkable. Vascular/Lymphatic: No ascites or pneumoperitoneum. There multiple enlarged retroperitoneal and pelvic lymph nodes with largest right common iliac group of lymph node measuring up to 2.2 x 2.4 x 3.9 cm. Largest pelvic lymph node is a right pelvic sidewall lymph node measuring 2.0 x 2.2 cm. These are compatible with metastases. No aneurysmal dilation of the major abdominal arteries. There are moderate peripheral atherosclerotic vascular calcifications of the aorta and its major branches. Reproductive: Normal size prostate. Symmetric seminal vesicles. Other: There is a small periumbilical hernia containing portion of unobstructed bowel loop. There also tiny bilateral fat containing inguinal hernias. The soft tissues and abdominal wall are otherwise unremarkable. Musculoskeletal: There are extensive sclerotic lesion throughout the bones, compatible with metastases. No pathological fracture. Intramedullary nail noted in the proximal right femur. There are mild - moderate multilevel degenerative changes in the visualized spine. Review of the MIP images confirms the above findings. IMPRESSION: 1. There is fusiform aneurysmal dilation of infrarenal aorta measuring up to 4.2-4.3 cm in maximum diameter, as described in detail above. No evidence of dissection or acute aortic syndrome. 2. There is  small to moderate amount of air in the right atrium, right ventricle and vessels in the lower neck, most likely secondary to introduction of air while obtaining an intravenous line. Correlate clinically/check connections to exclude continuous leak. 3. There are multiple sclerotic lesions throughout the imaged bones, compatible with metastases. No pathological fracture. 4. There are also multiple enlarged retroperitoneal and pelvic lymph nodes, compatible with metastases. 5. There are multiple pulmonary nodules, as described above. These are indeterminate and may represent metastases. 6. Multiple other nonacute observations, as described above. Aortic aneurysm NOS (ICD10-I71.9). Aortic Atherosclerosis (ICD10-I70.0). Electronically Signed   By: Ree Molt M.D.   On: 08/02/2023 12:56   DG Chest 2 View Result Date: 08/02/2023 CLINICAL DATA:  Chest pressure, with back, shoulder, and leg pain. EXAM: CHEST - 2 VIEW COMPARISON:  11/04/2014 FINDINGS: Hazy opacity at the left lung base is nonspecific but could reflect atelectasis or early bronchopneumonia. The lungs appear otherwise clear. Cardiac and mediastinal margins appear normal. No blunting of the costophrenic angles. Prominent right lateral contour of the ascending thoracic aorta could be due to tortuosity or ascending aortic aneurysm. Mild lower thoracic spondylosis. IMPRESSION: 1. Hazy opacity at  the left lung base is nonspecific but could reflect atelectasis or early bronchopneumonia. 2. Prominent right lateral contour of the ascending thoracic aorta could be due to tortuosity or ascending aortic aneurysm. This could be further characterized with chest CT, if clinically warranted. 3. Mild lower thoracic spondylosis. Electronically Signed   By: Ryan Salvage M.D.   On: 08/02/2023 10:24    ASSESSMENT: Stage IVb prostate cancer.  PLAN:    Stage IVb prostate cancer: Patient's PSA is 930. PSMA PET confirmed metastatic disease with positive the  prostate gland, pelvic lymphadenopathy, and widespread bony metastasis.  Will get biopsy of one of his pelvic lymph nodes to confirm the diagnosis and obtain a Gleason score.  Patient will return to clinic after his biopsy to initiate Firmagon, Xgeva, and Zytiga .   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  Previously, patient CEA was chronically elevated ranging from 4.6-8.1 since at least October 2018.  His most recent CEA is 6.3.  He had a colonoscopy and upper endoscopy on January 21, 2019 that did not reveal any significant pathology or evidence of recurrent disease.  No further interventions are needed.   H/o DVT: Patient has discontinued Coumadin  and is now on Xarelto.  Managed by patient's primary care. Chronic renal insufficiency: Patient's most recent GFR is 41 which appears better than his baseline.  Monitor.   Anemia: Mild.  Patient's most recent hemoglobin is 12.2. Neuropathy: Patient does not complain of this today.    I spent a total of 30 minutes reviewing chart data, face-to-face evaluation with the patient, counseling and coordination of care as detailed above.  Patient expressed understanding and was in agreement with this plan. He also understands that He can call clinic at any time with any questions, concerns, or complaints.   Colon cancer   Staging form: Colon/Rectum, AJCC 7th Edition     Pathologic stage from 05/04/2014: Stage IIIB (T4, N1, M0) - Signed by Evalene JINNY Reusing, MD on 05/04/2014   Evalene JINNY Reusing, MD   08/30/2023 4:29 PM

## 2023-08-27 NOTE — Progress Notes (Unsigned)
Patient here today for follow up, results.  

## 2023-08-27 NOTE — Progress Notes (Signed)
Patient not seen by CPP today

## 2023-08-27 NOTE — Telephone Encounter (Signed)
 Clinical Pharmacist Practitioner Encounter   Received new prescription for Zytiga  (abiraterone ) for the treatment of newly diagnosed metastatic castartion sensitive prostate cancer in conjunction with prednisone  and ADT, planned duration until disease progression or unacceptable drug toxicity.  CMP from 08/02/23 assessed, no relevant lab abnormalities. Prescription dose and frequency assessed.   Current medication list in Epic reviewed, a few DDIs with abiraterone  identified: Metoprolol : Abiraterone  may increase serum concentrations of Metoprolol . Monitor closely for evidence of excessive response to metoprolol . No baseline dose adjustment needed. Atorvastatin: Abiraterone  Acetate may increase myopathic (rhabdomyolysis) effects of HMG-CoA Reductase Inhibitors. Monitor for evidence of muscle toxicities (eg, myopathy, rhabdomyolysis). No baseline dose adjustment needed.  Evaluated chart and no patient barriers to medication adherence identified.   Prescription has been e-scribed to the [facility] for benefits analysis and approval.  Oral Oncology Clinic will continue to follow for insurance authorization, copayment issues, initial counseling and start date.   James Keller, PharmD, BCOP, CPP Hematology/Oncology Clinical Pharmacist [facility] Oral Chemotherapy Navigation Clinic [phone]  08/27/2023 3:49 PM

## 2023-08-29 ENCOUNTER — Encounter: Payer: Self-pay | Admitting: *Deleted

## 2023-08-29 NOTE — Patient Instructions (Signed)
 Anticoagulation clearance faxed to [facility] at request of IR. Patient will need to hold Xarelto for 1 day prior to CT guided biopsy of lymph node per IR. Fax confirmation received.

## 2023-08-29 NOTE — Progress Notes (Signed)
 [name], MD sent to James Keller S PROCEDURE / BIOPSY REVIEW Date: 08/27/23  Requested Biopsy site: R pelvic LAN Reason for request: r/o prostate met Imaging review: Best seen on PET 08/15/23 6:134  Decision: Approved Imaging modality to perform: CT Schedule with: Moderate Sedation Schedule for: Any VIR  Additional comments: @VIR : transgluteal  Please contact me with questions, concerns, or if issue pertaining to this request arise.  [name] Johann, MD [facility]

## 2023-08-30 ENCOUNTER — Encounter: Payer: Self-pay | Admitting: Oncology

## 2023-08-30 DIAGNOSIS — C61 Malignant neoplasm of prostate: Secondary | ICD-10-CM | POA: Insufficient documentation

## 2023-08-31 ENCOUNTER — Telehealth: Payer: Self-pay | Admitting: Pharmacy Technician

## 2023-08-31 ENCOUNTER — Other Ambulatory Visit (HOSPITAL_COMMUNITY): Payer: Self-pay

## 2023-08-31 MED ORDER — PREDNISONE 5 MG PO TABS
5.0000 mg | ORAL_TABLET | Freq: Every day | ORAL | 2 refills | Status: DC
  Filled 2023-08-31: qty 30, 30d supply, fill #0
  Filled 2023-10-11: qty 30, 30d supply, fill #1
  Filled 2023-11-02 – 2023-11-05 (×2): qty 30, 30d supply, fill #2

## 2023-08-31 MED ORDER — ABIRATERONE ACETATE 250 MG PO TABS
1000.0000 mg | ORAL_TABLET | Freq: Every day | ORAL | 2 refills | Status: DC
  Filled 2023-09-03: qty 120, 30d supply, fill #0
  Filled 2023-09-27 – 2023-10-11 (×2): qty 120, 30d supply, fill #1
  Filled 2023-11-02 – 2023-11-05 (×2): qty 120, 30d supply, fill #2

## 2023-08-31 NOTE — Telephone Encounter (Signed)
 Oral Oncology Patient Advocate Encounter  Prior Authorization for Abiraterone  has been approved.    PA#  [id number] Effective dates: 08/31/2023 through 01/23/2024  Patients co-pay is $0.    [place] ([name]) [name], CPhT  [facility], [name], [place] Oral Chemotherapy Patient Advocate Specialist III Phone: [phone]  Fax: [phone]

## 2023-08-31 NOTE — Telephone Encounter (Signed)
 Oral Oncology Patient Advocate Encounter   Received notification that prior authorization for Abiraterone  is required.   PA submitted on CMM on 08/31/2023 Key [id number] Status is pending     Arlinda Barcelona ([name]) [name], CPhT  [facility], [name], [place] Oral Chemotherapy Patient Advocate Specialist III Phone: [phone]  Fax: [phone]

## 2023-09-03 ENCOUNTER — Other Ambulatory Visit: Payer: Self-pay | Admitting: Pharmacy Technician

## 2023-09-03 ENCOUNTER — Other Ambulatory Visit: Payer: Self-pay

## 2023-09-03 ENCOUNTER — Telehealth: Payer: Self-pay | Admitting: Pharmacy Technician

## 2023-09-03 NOTE — Telephone Encounter (Signed)
 Patient successfully OnBoarded and drug education provided by pharmacist. Medication scheduled to be shipped on 09/04/2023 for delivery on 09/05/2023 from [facility] to patient's address. Patient also knows to call me at [phone] with any questions or concerns regarding receiving medication or if there is any unexpected change in co-pay.   James Keller ([name]) [name], CPhT  [facility], [name], [place] Oral Chemotherapy Patient Advocate Specialist III Phone: [phone]  Fax: [phone]

## 2023-09-03 NOTE — Telephone Encounter (Signed)
 Clinical Pharmacist Practitioner Encounter   Patient will start his abiraterone  following his post-biopsy office   Patient Education I spoke with patient for overview of new oral chemotherapy medication: Zytiga  (abiraterone ) for the treatment of newly diagnosed metastatic castartion sensitive prostate cancer in conjunction with prednisone  and ADT, planned duration until disease progression or unacceptable drug toxicity.   Counseled patient on administration, dosing, side effects, monitoring, drug-food interactions, safe handling, storage, and disposal. Patient will take: Abiraterone : Take 4 tablets (1,000 mg total) by mouth daily. Take on an empty stomach 1 hour before or 2 hours after a meal  Prednisone : Take 1 tablet (5 mg total) by mouth daily with breakfast.   Side effects include but not limited to: edema, fatigue, hypertension, decreased wbc.    Reviewed with patient importance of keeping a medication schedule and plan for any missed doses.  After discussion with patient no patient barriers to medication adherence identified.   Distress evaluation: Distress thermometer completed during telephone call and reviewed with patient. Due to score, social work referral has been sent.   Mr. Doell voiced understanding and appreciation. All questions answered. Medication handout provided.  Provided patient with Oral Chemotherapy Navigation Clinic phone number. Patient knows to call the office with questions or concerns. Oral Chemotherapy Navigation Clinic will continue to follow.  Doree Kuehne N. Rexann Lueras, PharmD, BCOP, CPP Hematology/Oncology Clinical Pharmacist [facility] Oral Chemotherapy Navigation Clinic [phone]  09/03/2023 1:44 PM

## 2023-09-03 NOTE — Progress Notes (Signed)
 Patient education documented in EPIC note on 09/03/23.

## 2023-09-03 NOTE — Progress Notes (Signed)
 Specialty Pharmacy Initial Fill Coordination Note  James Keller is a 73 y.o. male contacted today regarding refills of specialty medication(s) Abiraterone  Acetate (ZYTIGA ) .  Patient requested Delivery  on 09/05/23  to verified address [address] [place] [address]   Medication will be filled on 09/04/2023.   Patient is aware of $0 copayment.   James Keller ([name]) [name], CPhT  [facility], [name], [facility] Oral Chemotherapy Patient Advocate Specialist III Phone: [phone]  Fax: [phone]

## 2023-09-04 ENCOUNTER — Inpatient Hospital Stay

## 2023-09-04 NOTE — Progress Notes (Signed)
 [facility] Psychosocial Distress Screening Clinical Social Work  YOUSUF AGER is a 73 y.o. year old male. Clinical Social Work was referred by pharmacy for positive distress screening. The patient scored a 8 on the Psychosocial Distress Thermometer which indicates severe distress. Clinical Social Worker contacted patient by phone to assess for distress and other psychosocial needs. Patient denied feeling anxious or worried.    Distress Screen:    09/03/2023    4:00 PM  ONCBCN DISTRESS SCREENING  Screening Type Initial Screening  How much distress have you been experiencing in the past week? (0-10) 8  Emotional concerns type Worry or anxiety     Interventions: Patient interviewed and appropriate assessments performed: brief mental health assessment Mailed patient CSW contact information and [place].  CSW and patient discussed common feeling and emotions when being diagnosed with cancer, and the importance of support during treatment.  CSW informed patient of the support team and support services at [facility].  CSW provided contact information and encouraged patient to call with any questions or concerns.   Follow Up Plan: CSW will follow-up with patient by phone  Patient verbalizes understanding of plan: Yes    Macario CHRISTELLA Au, LCSW     Patient is participating in a Managed Medicaid Plan:  Yes

## 2023-09-06 ENCOUNTER — Other Ambulatory Visit: Payer: Self-pay | Admitting: Oncology

## 2023-09-06 NOTE — Progress Notes (Signed)
 New Patient Visit    Chief Complaint: Preop evaluation for lymph node biopsy procedure Date of Service: 09/06/2023 Date of Birth: 1950/10/07 PCP: [facility]  History of Present Illness: James Keller is a 73 y.o.male patient who presented for preop evaluation for lymph node biopsy procedure.  Past medical history significant for paroxysmal atrial fibrillation, hypertension, hyperlipidemia, history of prior adenocarcinoma of colon status post chemotherapy, history of DVT, CKD.  Echocardiogram 02/2020 with preserved biventricular systolic function, LVEF 50% with no significant valvular abnormality.  He is recently noted to have pelvic lymphadenopathy and is pending lymph node biopsy.  Came for preop evaluation.  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  No complaints of chest pain/pressure or shortness of breath.  No palpitations.  EKG today showed sinus rhythm with PAC, LVH with repolarization changes noted similar to prior EKG.  Past Medical and Surgical History  Past Medical History Past Medical History:  Diagnosis Date  . Atrial fibrillation (CMS/HHS-HCC)   . Hypertension     Past Surgical History He has a past surgical history that includes flexible sigmoidoscopy (12/17/2013).   Medications and Allergies  Current Medications Current Outpatient Medications  Medication Sig Dispense Refill  . AMIOdarone  (PACERONE ) 200 MG tablet Take 0.5 tablets (100 mg total) by mouth once daily. 90 tablet 4  . amLODIPine (NORVASC) 10 MG tablet Take 1 tablet by mouth once daily    . atorvastatin (LIPITOR) 40 MG tablet Take 40 mg by mouth once daily    . EAR DROPS, CARBAMIDE PEROXIDE, 6.5 % otic solution Place 5 drops into both ears 2 (two) times daily    . lisinopril (PRINIVIL,ZESTRIL) 20 MG tablet Take 20 mg by mouth once daily.    SABRA lisinopriL (ZESTRIL) 40 MG tablet Take 40 mg by mouth once daily    . metoprolol  tartrate  (LOPRESSOR ) 25 MG tablet Take 0.5 tablets (12.5 mg total) by mouth 2 (two) times daily. 1804 tablet 4  . rivaroxaban (XARELTO) 10 mg tablet Take 10 mg by mouth daily with breakfast    . abiraterone  (ZYTIGA ) 250 mg tablet Take 1,000 mg by mouth once daily    . amLODIPine-atorvastatin (CADUET) 10-20 mg tablet Take 1 tablet by mouth once daily (Patient not taking: Reported on 09/06/2023)    . predniSONE  (DELTASONE ) 5 MG tablet Take 5 mg by mouth once daily (Patient not taking: Reported on 09/06/2023)     No current facility-administered medications for this visit.    Allergies Patient has no known allergies.  Social and Family History  Social History  reports that he has been smoking cigarettes. He has a 35.3 pack-year smoking history. He has never used smokeless tobacco. He reports current alcohol use of about 5.0 standard drinks of alcohol per week. He reports that he does not use drugs.  Family History family history includes Stroke in his brother.   Review of Systems   Review of Systems: The patient denies chest pain, shortness of breath, orthopnea, paroxysmal nocturnal dyspnea, pedal edema, palpitations, heart racing, presyncope, syncope.    Physical Examination   Vitals:BP 130/78 (BP Location: Left upper arm, Patient Position: Sitting)   Pulse 66   SpO2 99%  Ht:  Wt:  James Keller is no height or weight on file to calculate BSA. There is no height or weight on file to calculate BMI.  HEENT: Pupils equally reactive to light and accomodation  Neck: Supple, no significant JVD Lungs: clear to auscultation bilaterally; no  wheezes, rales, rhonchi Heart: Regular rate and rhythm. No murmur Extremities: no pedal edema  Assessment and Plan   73 y.o. male with  Preop risk stratification, for lymph node biopsy Paroxysmal atrial fibrillation LVH Hypertension Hyperlipidemia  Stable from cardiac standpoint without any anginal symptoms.  Euvolemic on exam.  EKG today showed sinus rhythm  with PACs, LVH with repolarization changes, change from before. Blood pressure well-controlled.  Continue amlodipine, lisinopril. Continue metoprolol , statin Continue Xarelto 20 mg daily with creatinine clearance of 55. Echocardiogram before next visit for assessment of LVEF/LVH  Regarding preop risk stratification, he will be at low risk for lymph node biopsy procedure.  No further cardiac evaluation is indicated at this time.  Optimized from cardiac standpoint Can hold Xarelto for 2-3 days prior to procedure.  Orders Placed This Encounter  Procedures  . ECG 12-lead  . Echo complete    Return in about 6 months (around 03/08/2024).  KRISHNA CHAITANYA ALLURI, MD  This dictation was prepared with dragon dictation. Any transcription errors that result from this process are unintentional.

## 2023-09-11 ENCOUNTER — Telehealth: Payer: Self-pay | Admitting: *Deleted

## 2023-09-11 NOTE — Telephone Encounter (Signed)
 RN placed call to patient regarding scheduling of biopsy. Patient is scheduled for lymph node biopsy on Monday 8/25 at 9:30, patient to arrive at 8:30. Patient instructed he will have to hold Xarelto for 24 hours, his last dose of Xarelto should be on Saturday 8/23. Patient verbalized understanding and is in agreement with plan. Patient advised to check in at the heart and vascular entrance, NPO after midnight and that he will need a driver to and from procedure.

## 2023-09-13 ENCOUNTER — Inpatient Hospital Stay

## 2023-09-13 ENCOUNTER — Other Ambulatory Visit: Payer: Self-pay | Admitting: Licensed Clinical Social Worker

## 2023-09-13 ENCOUNTER — Inpatient Hospital Stay (HOSPITAL_BASED_OUTPATIENT_CLINIC_OR_DEPARTMENT_OTHER): Admitting: Licensed Clinical Social Worker

## 2023-09-13 ENCOUNTER — Other Ambulatory Visit: Payer: Self-pay | Admitting: Interventional Radiology

## 2023-09-13 ENCOUNTER — Ambulatory Visit

## 2023-09-13 ENCOUNTER — Encounter: Payer: Self-pay | Admitting: Licensed Clinical Social Worker

## 2023-09-13 ENCOUNTER — Other Ambulatory Visit: Payer: Self-pay | Admitting: Nurse Practitioner

## 2023-09-13 DIAGNOSIS — C7951 Secondary malignant neoplasm of bone: Secondary | ICD-10-CM

## 2023-09-13 DIAGNOSIS — Z85038 Personal history of other malignant neoplasm of large intestine: Secondary | ICD-10-CM

## 2023-09-13 DIAGNOSIS — C61 Malignant neoplasm of prostate: Secondary | ICD-10-CM

## 2023-09-13 DIAGNOSIS — G893 Neoplasm related pain (acute) (chronic): Secondary | ICD-10-CM

## 2023-09-13 DIAGNOSIS — Z5111 Encounter for antineoplastic chemotherapy: Secondary | ICD-10-CM | POA: Diagnosis not present

## 2023-09-13 DIAGNOSIS — Z01818 Encounter for other preprocedural examination: Secondary | ICD-10-CM

## 2023-09-13 LAB — GENETIC SCREENING ORDER

## 2023-09-13 MED ORDER — OXYCODONE HCL 5 MG PO TABS: 2.5000 mg | ORAL_TABLET | Freq: Three times a day (TID) | ORAL | 0 refills | Status: DC | PRN

## 2023-09-13 NOTE — Progress Notes (Signed)
 REFERRING PROVIDER: Jacobo Evalene PARAS, MD [address] [place],  [place] [address]  PRIMARY PROVIDER:  [facility]  PRIMARY REASON FOR VISIT:  1. Prostate cancer metastatic to bone (HCC)   2. Personal history of colon cancer      HISTORY OF PRESENT ILLNESS:   James Keller, a 73 y.o. male, was seen for a [place] cancer genetics consultation at the request of Dr. Jacobo due to a personal history of colon and metastatic prostate cancer.  James Keller presents to clinic today to discuss the possibility of a hereditary predisposition to cancer, genetic testing, and to further clarify his future cancer risks, as well as potential cancer risks for family members.   CANCER HISTORY:  In 2025, at the age of 30, James Keller was diagnosed with metastatic prostate cancer.   In 2015, James Keller was diagnosed with adenocarcinoma of the descending colon. This was treated with resection and chemotherapy.  Oncology History  Prostate cancer metastatic to bone [facility])  08/30/2023 Initial Diagnosis   Prostate cancer metastatic to bone (HCC)   08/30/2023 Cancer Staging   Staging form: Prostate, AJCC 8th Edition - Clinical stage from 08/30/2023: Stage IVB (cTX, cN1, cM1b, PSA: 930) - Signed by Jacobo Evalene PARAS, MD on 08/30/2023 Stage prefix: Initial diagnosis Prostate specific antigen (PSA) range: 20 or greater     Past Medical History:  Diagnosis Date   Colon cancer (HCC)    a. 11/2013 s/p colon resection/Hartmann's procedure;  b. Chemo: FOLFOX completed 07/2014.   DVT (deep venous thrombosis) (HCC)    RIGHT LEG/ HX OF   DVT (deep venous thrombosis) (HCC)    Dysrhythmia    Hypertension    Lower extremity edema    Rt leg   Neuromuscular disorder (HCC)    TINGLING IN FINGERS and feet   PAF (paroxysmal atrial fibrillation) (HCC)    S/P chemotherapy, time since less than 4 weeks    Tobacco abuse     Past Surgical History:  Procedure Laterality Date   COLON  RESECTION  12/19/2013   colostomy   COLON SURGERY     COLONOSCOPY WITH PROPOFOL  N/A 09/14/2014   Procedure: COLONOSCOPY WITH PROPOFOL  THROUGH COLOSTOMY AND COLONOSCOPY THRU RECTUM;  Surgeon: Rogelia Copping, MD;  [facility]: [facility];  Service: Endoscopy;  Laterality: N/A;  TRANSVERSE COLON POLYP AND SIGMOID COLON POLYP   COLONOSCOPY WITH PROPOFOL  N/A 11/29/2015   Procedure: COLONOSCOPY WITH PROPOFOL ;  Surgeon: Ruel Kung, MD;  Location: [facility] END[facility];  Service: Endoscopy;  Laterality: N/A;   COLONOSCOPY WITH PROPOFOL  N/A 01/21/2019   Procedure: COLONOSCOPY WITH PROPOFOL ;  Surgeon: Copping Rogelia, MD;  Location: [facility] ENDOSCOPY;  Service: Endoscopy;  Laterality: N/A;   COLOSTOMY REVERSAL N/A 10/28/2014   Procedure: COLOSTOMY REVERSAL;  Surgeon: Lonni Brands, MD;  Location: [facility] ORS;  Service: General;  Laterality: N/A;   ESOPHAGOGASTRODUODENOSCOPY (EGD) WITH PROPOFOL  N/A 01/21/2019   Procedure: ESOPHAGOGASTRODUODENOSCOPY (EGD) WITH PROPOFOL ;  Surgeon: Copping Rogelia, MD;  Location: [facility] END[facility];  Service: Endoscopy;  Laterality: N/A;   FEMUR FRACTURE SURGERY Right    FRACTURE SURGERY     LEG SURGERY     LYSIS OF ADHESION  10/28/2014   Procedure: LYSIS OF ADHESION;  Surgeon: Lonni Brands, MD;  Location: [facility] ORS;  Service: General;;   PORT A CATH INJECTION ([facility] HX) Right    TUNNELED VENOUS PORT PLACEMENT      FAMILY HISTORY:  We obtained a detailed, 4-generation family history.  Significant diagnoses are listed below:  Family History  Problem Relation Age of Onset   Hypertension Mother    Liver cancer Paternal Grandfather     James Keller has 1 son, 37, with history of skin cancer. He has 2 full sisters, 3 full brothers, 3 paternal half sisters, no cancers.  James Keller mother passed at 11. No known cancers on this side of the family.  James Keller father passed over age 22. Patient's paternal grandmother had liver cancer and passed over 19.   James Keller  is unaware of previous family history of genetic testing for hereditary cancer risks. There is no reported Ashkenazi Jewish ancestry. There is no known consanguinity.    GENETIC COUNSELING ASSESSMENT: James Keller is a 73 y.o. male with a personal history of metastatic prostate cancer and colon cancer which is somewhat suggestive of a hereditary cancer syndrome and predisposition to cancer. We, therefore, discussed and recommended the following at today's visit.   DISCUSSION: We discussed that approximately 10% of prostate cancer is hereditary. Most cases of hereditary prostate cancer are associated with BRCA1/BRCA2 genes, although there are other genes associated with hereditary cancer as well. Cancers and risks are gene specific. We discussed that testing is beneficial for several reasons including knowing about cancer risks, identifying potential screening and risk-reduction options that may be appropriate, and to understand if other family members could be at risk for cancer and allow them to undergo genetic testing.   We reviewed the characteristics, features and inheritance patterns of hereditary cancer syndromes. We also discussed genetic testing, including the appropriate family members to test, the process of testing, insurance coverage and turn-around-time for results. We discussed the implications of a negative, positive and/or variant of uncertain significant result. We recommended James Keller pursue genetic testing for the [facility] CancerNext+RNA gene panel.   The [facility] CancerNext+RNAinsight Panel includes sequencing, rearrangement analysis, and RNA analysis for the following 40 genes: APC, ATM, BAP1, BARD1, BMPR1A, BRCA1, BRCA2, BRIP1, CDH1, CDKN2A, CHEK2, FH, FLCN, MET, MLH1, MSH2, MSH6, MUTYH, NF1, NTHL1, PALB2, PMS2, PTEN, RAD51C, RAD51D, RPS20, SMAD4, STK11, TP53, TSC1, TSC2, and VHL (sequencing and deletion/duplication); AXIN2, HOXB13, MBD4, MSH3, POLD1 and POLE (sequencing only); EPCAM  and GREM1 (deletion/duplication only).  Based on James Keller personal history of cancer, he meets medical criteria for genetic testing. Despite that he meets criteria, he may still have an out of pocket cost.   PLAN: After considering the risks, benefits, and limitations, James Keller provided informed consent to pursue genetic testing and the blood sample was sent to [facility] for analysis of the CancerNext+RNA Panel. Results should be available within approximately 2-3 weeks' time, at which point they will be disclosed by telephone to James Keller, as will any additional recommendations warranted by these results. James Keller will receive a summary of his genetic counseling visit and a copy of his results once available. This information will also be available in Epic.   James Keller questions were answered to his satisfaction today. Our contact information was provided should additional questions or concerns arise. Thank you for the referral and allowing us  to share in the care of your patient.   Dena Cary, MS, [facility] Genetic Counselor [email or web address] Phone: [phone]  45 minutes were spent on the date of the encounter in service to the patient including preparation, face-to-face consultation, documentation and care coordination. Dr. Delinda was available for discussion regarding this case.   _______________________________________________________________________ For Office Staff:  Number of people involved in session: 2 Was an Intern/ student involved with case:  no

## 2023-09-13 NOTE — Progress Notes (Signed)
 Pain unrelieved by tylenol . Start oxycodone  2.5-5 mg (renal dosing) q8h prn.

## 2023-09-13 NOTE — Progress Notes (Signed)
genetic

## 2023-09-14 NOTE — Progress Notes (Addendum)
 Patient for CT guided RT pelvic LAN biopsy on Monday 09/17/23, I called and spoke with the patient on the phone and gave pre-procedure instructions. Pt was made aware to be here at 8:30a, last dose of Xarelto was Sat 09/15/23, NPO after MN prior to procedure as well as driver post procedure/recovery/discharge. Pt stated understanding.  Called 09/14/23

## 2023-09-16 NOTE — H&P (Signed)
 Chief Complaint: Patient was seen in consultation today for pelvic adenopathy in the setting of stage IVb prostate cancer with metastasis, and with consideration for biopsy.  Referring Provider(s): Dr. Evalene Reusing, MD   Supervising Physician: Jenna Hacker  Patient Status: [facility] - Out-pt  Patient is Full Code  History of Present Illness: James Keller is a 73 y.o. male  with PMHx notable for stage Ivb prostate cancer with metastasis to axial and appendicular skeleton, and to retroperitoneum, with associated lymphadenopathy, and others as delineated below.  Per Dr. Jerone progress note on 8/4: Stage IVb prostate cancer: Patient's PSA is 930. PSMA PET confirmed metastatic disease with positive the prostate gland, pelvic lymphadenopathy, and widespread bony metastasis.  Will get biopsy of one of his pelvic lymph nodes to confirm the diagnosis and obtain a Gleason score.  Patient will return to clinic after his biopsy to initiate Firmagon , Xgeva, and Zytiga .   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  Previously, patient CEA was chronically elevated ranging from 4.6-8.1 since at least October 2018.  His most recent CEA is 6.3.  He had a colonoscopy and upper endoscopy on January 21, 2019 that did not reveal any significant pathology or evidence of recurrent disease.  No further interventions are needed.   H/o DVT: Patient has discontinued Coumadin  and is now on Xarelto.  Managed by patient's primary care.  Interventional Radiology was requested for pelvic lymph node biopsy. Request was reviewed and approved by Dr. Johann. Patient is scheduled for same in IR today.   Patient is alert and laying in bed, calm. Significant other at bedside. Patient is currently without any significant complaints. He does endorse a headache, lumbar back pain, and bilateral thigh/leg pain, all of which are  improved from this morning after taking tylenol . Patient denies any fevers, headache, chest pain, SOB, cough, abdominal pain, nausea, vomiting or bleeding.    Past Medical History:  Diagnosis Date   Colon cancer (HCC)    a. 11/2013 s/p colon resection/Hartmann's procedure;  b. Chemo: FOLFOX completed 07/2014.   DVT (deep venous thrombosis) (HCC)    RIGHT LEG/ HX OF   DVT (deep venous thrombosis) (HCC)    Dysrhythmia    Hypertension    Lower extremity edema    Rt leg   Neuromuscular disorder (HCC)    TINGLING IN FINGERS and feet   PAF (paroxysmal atrial fibrillation) (HCC)    S/P chemotherapy, time since less than 4 weeks    Tobacco abuse     Past Surgical History:  Procedure Laterality Date   COLON RESECTION  12/19/2013   colostomy   COLON SURGERY     COLONOSCOPY WITH PROPOFOL  N/A 09/14/2014   Procedure: COLONOSCOPY WITH PROPOFOL  THROUGH COLOSTOMY AND COLONOSCOPY THRU RECTUM;  Surgeon: Rogelia Copping, MD;  Location: [facility];  Service: Endoscopy;  Laterality: N/A;  TRANSVERSE COLON POLYP AND SIGMOID COLON POLYP   COLONOSCOPY WITH PROPOFOL  N/A 11/29/2015   Procedure: COLONOSCOPY WITH PROPOFOL ;  Surgeon: Ruel Kung, MD;  Location: [facility];  Service: Endoscopy;  Laterality: N/A;   COLONOSCOPY WITH PROPOFOL  N/A 01/21/2019   Procedure: COLONOSCOPY WITH PROPOFOL ;  Surgeon: Copping Rogelia, MD;  Location: [facility];  Service: Endoscopy;  Laterality: N/A;   COLOSTOMY REVERSAL N/A 10/28/2014   Procedure: COLOSTOMY REVERSAL;  Surgeon: Lonni Brands, MD;  Location: [facility];  Service: General;  Laterality: N/A;   ESOPHAGOGASTRODUODENOSCOPY (EGD)  WITH PROPOFOL  N/A 01/21/2019   Procedure: ESOPHAGOGASTRODUODENOSCOPY (EGD) WITH PROPOFOL ;  Surgeon: Jinny Carmine, MD;  Location: [facility];  Service: Endoscopy;  Laterality: N/A;   FEMUR FRACTURE SURGERY Right    FRACTURE SURGERY     LEG SURGERY     LYSIS OF ADHESION  10/28/2014   Procedure: LYSIS OF ADHESION;  Surgeon:  Lonni Brands, MD;  Location: [facility];  Service: General;;   PORT A CATH INJECTION ([facility] HX) Right    TUNNELED VENOUS PORT PLACEMENT      Allergies: Patient has no known allergies.  Medications: Prior to Admission medications   Medication Sig Start Date End Date Taking? Authorizing Provider  abiraterone  acetate (ZYTIGA ) 250 MG tablet Take 4 tablets (1,000 mg total) by mouth daily. Take on an empty stomach 1 hour before or 2 hours after a meal 08/31/23   Jacobo Evalene PARAS, MD  amiodarone  (PACERONE ) 200 MG tablet Take 200 mg by mouth daily.  11/11/14   [provider]  amLODipine (NORVASC) 10 MG tablet Take 1 tablet by mouth daily. 06/29/15 08/27/23  [provider]  amlodipine-atorvastatin (CADUET) 10-20 MG tablet Take 1 tablet by mouth daily.    [provider]  gabapentin  (NEURONTIN ) 300 MG capsule Take 1 capsule (300 mg total) by mouth 3 (three) times daily. 09/07/14   Jacobo Evalene PARAS, MD  lisinopril (PRINIVIL,ZESTRIL) 20 MG tablet Take 1 tablet by mouth daily. Reported on 08/12/2015    [provider]  lisinopril (ZESTRIL) 40 MG tablet Take 40 mg by mouth daily. 06/11/19   [provider]  metoprolol  tartrate (LOPRESSOR ) 25 MG tablet Take 25 mg by mouth 2 (two) times daily. Am and pm 12/27/13   [provider]  oxyCODONE  (OXY IR/ROXICODONE ) 5 MG immediate release tablet Take 0.5-1 tablets (2.5-5 mg total) by mouth every 8 (eight) hours as needed for severe pain (pain score 7-10). 09/13/23   Dasie Tinnie MATSU, NP  phenazopyridine  (PYRIDIUM ) 95 MG tablet Take 1 tablet (95 mg total) by mouth 3 (three) times daily as needed for pain. 06/13/21   Bradler, Evan K, MD  predniSONE  (DELTASONE ) 5 MG tablet Take 1 tablet (5 mg total) by mouth daily with breakfast. 08/31/23   Jacobo Evalene PARAS, MD  rivaroxaban (XARELTO) 20 MG TABS tablet Take 20 mg by mouth daily with supper.    [provider]     Family History  Problem Relation Age of  Onset   Hypertension Mother    Liver cancer Paternal Grandfather     Social History   Socioeconomic History   Marital status: Single    Spouse name: Not on file   Number of children: Not on file   Years of education: Not on file   Highest education level: Not on file  Occupational History   Not on file  Tobacco Use   Smoking status: Every Day    Current packs/day: 0.50    Average packs/day: 0.5 packs/day for 54.0 years (27.0 ttl pk-yrs)    Types: Cigarettes   Smokeless tobacco: Never  Vaping Use   Vaping status: Never Used  Substance and Sexual Activity   Alcohol use: Yes    Alcohol/week: 6.0 standard drinks of alcohol    Types: 6 Cans of beer per week    Comment: moderate beer   Drug use: Yes    Types: Marijuana    Comment: occasional   Sexual activity: Not on file  Other Topics Concern   Not on file  Social  History Narrative   Not on file   Social Drivers of Health   Financial Resource Strain: Not on file  Food Insecurity: No Food Insecurity (08/09/2023)   Hunger Vital Sign    Worried About Running Out of Food in the Last Year: Never true    Ran Out of Food in the Last Year: Never true  Transportation Needs: No Transportation Needs (08/09/2023)   PRAPARE - Administrator, Civil Service (Medical): No    Lack of Transportation (Non-Medical): No  Physical Activity: Not on file  Stress: Not on file  Social Connections: Not on file     Review of Systems: A 12 point ROS discussed and pertinent positives are indicated in the HPI above.  All other systems are negative.  Vital Signs: BP 123/67   Pulse 87   Temp 98.6 F (37 C) (Oral)   Resp 20   Ht 6' 1 (1.854 m)   Wt 200 lb 11.2 oz (91 kg)   SpO2 99%   BMI 26.48 kg/m   Advance Care Plan: The advanced care place/surrogate decision maker was discussed at the time of visit and the patient did not wish to discuss or was not able to name a surrogate decision maker or provide an advance care  plan.  Physical Exam Vitals reviewed.  Constitutional:      General: He is not in acute distress.    Appearance: Normal appearance.  HENT:     Mouth/Throat:     Mouth: Mucous membranes are dry.  Cardiovascular:     Rate and Rhythm: Normal rate and regular rhythm.     Pulses: Normal pulses.     Heart sounds: Normal heart sounds.  Pulmonary:     Effort: Pulmonary effort is normal.     Breath sounds: Normal breath sounds.  Abdominal:     General: Abdomen is flat.     Tenderness: There is abdominal tenderness.     Comments: Moderate and midline tenderness to palpation, worst at suprapubic region.  Musculoskeletal:        General: Normal range of motion.     Cervical back: Normal range of motion.  Skin:    General: Skin is warm and dry.  Neurological:     Mental Status: He is alert and oriented to person, place, and time.  Psychiatric:        Mood and Affect: Mood normal.        Behavior: Behavior normal.        Thought Content: Thought content normal.        Judgment: Judgment normal.     Imaging: No results found.  Labs:  CBC: Recent Labs    08/02/23 0935 09/17/23 0853  WBC 6.1 6.4  HGB 12.2* 11.2*  HCT 39.3 35.8*  PLT 256 248    COAGS: No results for input(s): INR, APTT in the last 8760 hours.  BMP: Recent Labs    08/02/23 0935  NA 139  K 4.0  CL 103  CO2 26  GLUCOSE 114*  BUN 27*  CALCIUM  9.2  CREATININE 1.74*  GFRNONAA 41*    LIVER FUNCTION TESTS: Recent Labs    08/02/23 0935  BILITOT 0.9  AST 20  ALT 16  ALKPHOS 97  PROT 6.7  ALBUMIN 3.2*    TUMOR MARKERS: No results for input(s): AFPTM, CEA, CA199, CHROMGRNA in the last 8760 hours.  Assessment and Plan: Per Dr. Jerone progress note on 8/4: Stage IVb prostate cancer: Patient's PSA  is 930. PSMA PET confirmed metastatic disease with positive the prostate gland, pelvic lymphadenopathy, and widespread bony metastasis.  Will get biopsy of one of his pelvic lymph nodes  to confirm the diagnosis and obtain a Gleason score.  Patient will return to clinic after his biopsy to initiate Firmagon , Xgeva, and Zytiga .  Patient presents for scheduled pelvic lymph node biopsy in IR today.  Patient has been NPO since midnight.  All labs and medications are within acceptable parameters.  No pertinent allergies.   Risks and benefits of pelvic lymph node biopsy was discussed with the patient and/or patient's family including, but not limited to bleeding, infection, damage to adjacent structures or low yield requiring additional tests.  All of the questions were answered and there is agreement to proceed.  Consent signed and in chart.    Thank you for allowing our service to participate in James Keller 's care.  Electronically Signed: Carlin DELENA Griffon, PA-C   09/17/2023, 9:35 AM      I spent a total of 30 Minutes  in face to face in clinical consultation, greater than 50% of which was counseling/coordinating care for pelvic adenopathy in the setting of stage IVb prostate cancer with metastasis, and with consideration for biopsy. =

## 2023-09-17 ENCOUNTER — Other Ambulatory Visit: Payer: Self-pay | Admitting: Oncology

## 2023-09-17 ENCOUNTER — Other Ambulatory Visit: Payer: Self-pay

## 2023-09-17 ENCOUNTER — Ambulatory Visit
Admission: RE | Admit: 2023-09-17 | Discharge: 2023-09-17 | Disposition: A | Discharge status: Home / Self Care | Source: Ambulatory Visit | Attending: Oncology | Admitting: Oncology

## 2023-09-17 DIAGNOSIS — C61 Malignant neoplasm of prostate: Secondary | ICD-10-CM | POA: Diagnosis not present

## 2023-09-17 DIAGNOSIS — C7951 Secondary malignant neoplasm of bone: Secondary | ICD-10-CM | POA: Insufficient documentation

## 2023-09-17 DIAGNOSIS — Z7901 Long term (current) use of anticoagulants: Secondary | ICD-10-CM | POA: Insufficient documentation

## 2023-09-17 DIAGNOSIS — Z86718 Personal history of other venous thrombosis and embolism: Secondary | ICD-10-CM | POA: Insufficient documentation

## 2023-09-17 DIAGNOSIS — R972 Elevated prostate specific antigen [PSA]: Secondary | ICD-10-CM

## 2023-09-17 DIAGNOSIS — R590 Localized enlarged lymph nodes: Secondary | ICD-10-CM | POA: Insufficient documentation

## 2023-09-17 DIAGNOSIS — C772 Secondary and unspecified malignant neoplasm of intra-abdominal lymph nodes: Secondary | ICD-10-CM

## 2023-09-17 DIAGNOSIS — R599 Enlarged lymph nodes, unspecified: Secondary | ICD-10-CM

## 2023-09-17 DIAGNOSIS — Z01818 Encounter for other preprocedural examination: Secondary | ICD-10-CM

## 2023-09-17 DIAGNOSIS — F17210 Nicotine dependence, cigarettes, uncomplicated: Secondary | ICD-10-CM | POA: Diagnosis not present

## 2023-09-17 LAB — CBC
HCT: 35.8 % — ABNORMAL LOW (ref 39.0–52.0)
Hemoglobin: 11.2 g/dL — ABNORMAL LOW (ref 13.0–17.0)
MCH: 26.5 pg (ref 26.0–34.0)
MCHC: 31.3 g/dL (ref 30.0–36.0)
MCV: 84.8 fL (ref 80.0–100.0)
Platelets: 248 K/uL (ref 150–400)
RBC: 4.22 MIL/uL (ref 4.22–5.81)
RDW: 15 % (ref 11.5–15.5)
WBC: 6.4 K/uL (ref 4.0–10.5)
nRBC: 0 % (ref 0.0–0.2)

## 2023-09-17 MED ORDER — MIDAZOLAM HCL 2 MG/2ML IJ SOLN
INTRAMUSCULAR | Status: AC | PRN
Start: 2023-09-17 — End: 2023-09-17
  Administered 2023-09-17: 1 mg via INTRAVENOUS

## 2023-09-17 MED ORDER — FENTANYL CITRATE (PF) 100 MCG/2ML IJ SOLN
INTRAMUSCULAR | Status: AC
  Filled 2023-09-17: qty 2

## 2023-09-17 MED ORDER — FENTANYL CITRATE (PF) 100 MCG/2ML IJ SOLN
INTRAMUSCULAR | Status: AC | PRN
  Administered 2023-09-17: 50 ug via INTRAVENOUS

## 2023-09-17 MED ORDER — MIDAZOLAM HCL 2 MG/2ML IJ SOLN
INTRAMUSCULAR | Status: AC
  Filled 2023-09-17: qty 2

## 2023-09-17 MED ORDER — SODIUM CHLORIDE 0.9 % IV SOLN: INTRAVENOUS | Status: DC

## 2023-09-17 NOTE — Procedures (Signed)
 Pre procedural Dx: Mult sclerotic bone lesions  Post procedural Dx: Same  Technically successful CT guided biopsy of right iliac bone lesion.  Initial plan for right hemipelvic lymph node biopsy changed to bone biopsy whe target lesion was determined to not be accessible.  This case was discussed with Dr Jacobo prior to changing    EBL: None.   Complications: None immediate.   James Banner, MD Pager #: [phone]

## 2023-09-17 NOTE — Progress Notes (Signed)
 Patient took home meds that he missed this morning.

## 2023-09-17 NOTE — Progress Notes (Signed)
 Pt complained of 10/10 back and BLE pain, he said he didn't think he could take his morning pain meds, but he brought them with him. He arrived in a wheelchair D/T the pain, so I told him to take his meds. He took a 5mg  oxycodone  and a 500mg  Tylenol .

## 2023-09-17 NOTE — Progress Notes (Signed)
 Patient clinically stable post Ct Bone lesion/pelvic biopsy per Dr Jenna, tolerated well. Vitals stable pre and post procedure. Received Versed  1 mg along with Fentanyl  50 mcg IV for procedure. Report given to Jequetta Thomas Rn post procedure/specials/15

## 2023-09-19 ENCOUNTER — Other Ambulatory Visit: Payer: Self-pay | Admitting: *Deleted

## 2023-09-19 DIAGNOSIS — C61 Malignant neoplasm of prostate: Secondary | ICD-10-CM

## 2023-09-19 LAB — SURGICAL PATHOLOGY

## 2023-09-20 ENCOUNTER — Inpatient Hospital Stay

## 2023-09-20 ENCOUNTER — Inpatient Hospital Stay (HOSPITAL_BASED_OUTPATIENT_CLINIC_OR_DEPARTMENT_OTHER): Admitting: Oncology

## 2023-09-20 ENCOUNTER — Encounter: Payer: Self-pay | Admitting: Oncology

## 2023-09-20 ENCOUNTER — Inpatient Hospital Stay: Admitting: Pharmacist

## 2023-09-20 ENCOUNTER — Other Ambulatory Visit: Payer: Self-pay | Admitting: *Deleted

## 2023-09-20 VITALS — BP 115/83 | HR 95 | Temp 98.4°F | Resp 20 | Ht 73.0 in | Wt 198.9 lb

## 2023-09-20 VITALS — BP 109/82 | HR 68

## 2023-09-20 DIAGNOSIS — C61 Malignant neoplasm of prostate: Secondary | ICD-10-CM

## 2023-09-20 DIAGNOSIS — C7951 Secondary malignant neoplasm of bone: Secondary | ICD-10-CM | POA: Diagnosis not present

## 2023-09-20 DIAGNOSIS — Z5111 Encounter for antineoplastic chemotherapy: Secondary | ICD-10-CM | POA: Diagnosis not present

## 2023-09-20 LAB — CBC WITH DIFFERENTIAL/PLATELET
Abs Immature Granulocytes: 0.08 K/uL — ABNORMAL HIGH (ref 0.00–0.07)
Basophils Absolute: 0 K/uL (ref 0.0–0.1)
Basophils Relative: 0 %
Eosinophils Absolute: 0 K/uL (ref 0.0–0.5)
Eosinophils Relative: 0 %
HCT: 34.4 % — ABNORMAL LOW (ref 39.0–52.0)
Hemoglobin: 11.2 g/dL — ABNORMAL LOW (ref 13.0–17.0)
Immature Granulocytes: 1 %
Lymphocytes Relative: 11 %
Lymphs Abs: 0.7 K/uL (ref 0.7–4.0)
MCH: 27.1 pg (ref 26.0–34.0)
MCHC: 32.6 g/dL (ref 30.0–36.0)
MCV: 83.1 fL (ref 80.0–100.0)
Monocytes Absolute: 0.5 K/uL (ref 0.1–1.0)
Monocytes Relative: 7 %
Neutro Abs: 5.6 K/uL (ref 1.7–7.7)
Neutrophils Relative %: 81 %
Platelets: 270 K/uL (ref 150–400)
RBC: 4.14 MIL/uL — ABNORMAL LOW (ref 4.22–5.81)
RDW: 15.1 % (ref 11.5–15.5)
WBC: 6.9 K/uL (ref 4.0–10.5)
nRBC: 0 % (ref 0.0–0.2)

## 2023-09-20 LAB — CMP (CANCER CENTER ONLY)
ALT: 18 U/L (ref 0–44)
AST: 43 U/L — ABNORMAL HIGH (ref 15–41)
Albumin: 3.1 g/dL — ABNORMAL LOW (ref 3.5–5.0)
Alkaline Phosphatase: 303 U/L — ABNORMAL HIGH (ref 38–126)
Anion gap: 13 (ref 5–15)
BUN: 30 mg/dL — ABNORMAL HIGH (ref 8–23)
CO2: 19 mmol/L — ABNORMAL LOW (ref 22–32)
Calcium: 9.2 mg/dL (ref 8.9–10.3)
Chloride: 100 mmol/L (ref 98–111)
Creatinine: 1.72 mg/dL — ABNORMAL HIGH (ref 0.61–1.24)
GFR, Estimated: 42 mL/min — ABNORMAL LOW (ref >60)
Glucose, Bld: 124 mg/dL — ABNORMAL HIGH (ref 70–99)
Potassium: 3.9 mmol/L (ref 3.5–5.1)
Sodium: 132 mmol/L — ABNORMAL LOW (ref 135–145)
Total Bilirubin: 0.9 mg/dL (ref 0.0–1.2)
Total Protein: 7.4 g/dL (ref 6.5–8.1)

## 2023-09-20 LAB — PSA: Prostatic Specific Antigen: >1500 ng/mL — ABNORMAL HIGH (ref 0.00–4.00)

## 2023-09-20 MED ORDER — ZOLEDRONIC ACID 4 MG/100ML IV SOLN: 4.0000 mg | Freq: Once | INTRAVENOUS | Status: DC

## 2023-09-20 MED ORDER — OXYCODONE HCL 10 MG PO TABS: 10.0000 mg | ORAL_TABLET | ORAL | 0 refills | Status: DC | PRN

## 2023-09-20 MED ORDER — SODIUM CHLORIDE 0.9 % IV SOLN
INTRAVENOUS | Status: DC
  Filled 2023-09-20: qty 250

## 2023-09-20 MED ORDER — DEGARELIX ACETATE(240 MG DOSE) 120 MG/VIAL ~~LOC~~ SOLR
240.0000 mg | Freq: Once | SUBCUTANEOUS | Status: AC
  Administered 2023-09-20: 240 mg via SUBCUTANEOUS
  Filled 2023-09-20: qty 6

## 2023-09-20 MED ORDER — ZOLEDRONIC ACID 4 MG/5ML IV CONC
3.5000 mg | Freq: Once | INTRAVENOUS | Status: AC
  Administered 2023-09-20: 3.5 mg via INTRAVENOUS
  Filled 2023-09-20: qty 4.38

## 2023-09-20 NOTE — Progress Notes (Unsigned)
 [facility]  [phone] [phone]  ID: James Keller OB: 09-04-1950  MR#: [id number]  RDW#:[id number]  Patient Care Team: Center, [facility] as PCP - General Jinny Carmine, MD as Consulting Physician (Gastroenterology) Jacobo Evalene JINNY, MD as Consulting Physician (Oncology)  CHIEF COMPLAINT: Stage IVb prostate cancer.  INTERVAL HISTORY: Patient returns to clinic today for further evaluation, discussion of his laboratory and imaging results, and treatment planning. He continues to feel well and remains asymptomatic.  He has no neurologic complaints.  He does not complain of any pain.  He denies any weakness or fatigue.  He denies any recent fevers or illnesses.  He has a good appetite and denies weight loss.  He denies any chest pain, shortness of breath, cough, or hemoptysis.  He denies any nausea, vomiting, or diarrhea.  He has no changes in his bowel movements.  He denies any melena or hematochezia.  He has no urinary complaints.  Patient offers no specific complaints today.  REVIEW OF SYSTEMS:   Review of Systems  Constitutional: Negative.  Negative for fever, malaise/fatigue and weight loss.  Respiratory: Negative.  Negative for cough, hemoptysis and shortness of breath.   Cardiovascular: Negative.  Negative for chest pain and leg swelling.  Gastrointestinal: Negative.  Negative for abdominal pain, blood in stool, constipation and melena.  Genitourinary: Negative.  Negative for frequency, hematuria and urgency.  Musculoskeletal: Negative.  Negative for back pain.  Skin: Negative.  Negative for rash.  Neurological: Negative.  Negative for dizziness, sensory change, focal weakness, weakness and headaches.  Psychiatric/Behavioral: Negative.  The patient is not nervous/anxious.     As per HPI. Otherwise, a complete review of systems is negative.  PAST MEDICAL HISTORY: Past Medical History:  Diagnosis Date   Colon cancer  (HCC)    a. 11/2013 s/p colon resection/Hartmann's procedure;  b. Chemo: FOLFOX completed 07/2014.   DVT (deep venous thrombosis) (HCC)    RIGHT LEG/ HX OF   DVT (deep venous thrombosis) (HCC)    Dysrhythmia    Hypertension    Lower extremity edema    Rt leg   Neuromuscular disorder (HCC)    TINGLING IN FINGERS and feet   PAF (paroxysmal atrial fibrillation) (HCC)    S/P chemotherapy, time since less than 4 weeks    Tobacco abuse     PAST SURGICAL HISTORY: Past Surgical History:  Procedure Laterality Date   COLON RESECTION  12/19/2013   colostomy   COLON SURGERY     COLONOSCOPY WITH PROPOFOL  N/A 09/14/2014   Procedure: COLONOSCOPY WITH PROPOFOL  THROUGH COLOSTOMY AND COLONOSCOPY THRU RECTUM;  Surgeon: Carmine Jinny, MD;  Location[facility];  Service: Endoscopy;  Laterality: N/A;  TRANSVERSE COLON POLYP AND SIGMOID COLON POLYP   COLONOSCOPY WITH PROPOFOL  N/A 11/29/2015   Procedure: COLONOSCOPY WITH PROPOFOL ;  Surgeon: Ruel Kung, MD;  Location: [facility];  Service: Endoscopy;  Laterality: N/A;   COLONOSCOPY WITH PROPOFOL  N/A 01/21/2019   Procedure: COLONOSCOPY WITH PROPOFOL ;  Surgeon: Jinny Carmine, MD;  Location: [facility] END[facility];  Service: Endoscopy;  Laterality: N/A;   COLOSTOMY REVERSAL N/A 10/28/2014   Procedure: COLOSTOMY REVERSAL;  Surgeon: Lonni Brands, MD;  Location: [facility] ORS;  Service: General;  Laterality: N/A;   ESOPHAGOGASTRODUODENOSCOPY (EGD) WITH PROPOFOL  N/A 01/21/2019   Procedure: ESOPHAGOGASTRODUODENOSCOPY (EGD) WITH PROPOFOL ;  Surgeon: Jinny Carmine, MD;  Location: [facility];  Service: Endoscopy;  Laterality: N/A;   FEMUR FRACTURE SURGERY Right    FRACTURE SURGERY     LEG  SURGERY     LYSIS OF ADHESION  10/28/2014   Procedure: LYSIS OF ADHESION;  Surgeon: Lonni Brands, MD;  Location: [facility] ORS;  Service: General;;   PORT A CATH INJECTION ([facility] HX) Right    TUNNELED VENOUS PORT PLACEMENT      FAMILY HISTORY: Grandmother with liver  cancer.     ADVANCED DIRECTIVES:    HEALTH MAINTENANCE: Social History   Tobacco Use   Smoking status: Every Day    Current packs/day: 0.50    Average packs/day: 0.5 packs/day for 54.0 years (27.0 ttl pk-yrs)    Types: Cigarettes   Smokeless tobacco: Never  Vaping Use   Vaping status: Never Used  Substance Use Topics   Alcohol use: Yes    Alcohol/week: 6.0 standard drinks of alcohol    Types: 6 Cans of beer per week    Comment: moderate beer   Drug use: Yes    Types: Marijuana    Comment: occasional     Colonoscopy:  PAP:  Bone density:  Lipid panel:  No Known Allergies   Current Outpatient Medications  Medication Sig Dispense Refill   acetaminophen  (TYLENOL ) 500 MG tablet Take 500 mg by mouth every 6 (six) hours as needed.     amLODipine (NORVASC) 10 MG tablet Take 1 tablet by mouth daily.     atorvastatin (LIPITOR) 40 MG tablet Take 40 mg by mouth daily.     gabapentin  (NEURONTIN ) 300 MG capsule Take 1 capsule (300 mg total) by mouth 3 (three) times daily. (Patient taking differently: Take 300 mg by mouth 3 (three) times daily. AS NEEDED) 90 capsule 1   lisinopril (ZESTRIL) 40 MG tablet Take 40 mg by mouth daily.     metoprolol  tartrate (LOPRESSOR ) 25 MG tablet Take 25 mg by mouth 2 (two) times daily. Am and pm     oxyCODONE  (OXY IR/ROXICODONE ) 5 MG immediate release tablet Take 0.5-1 tablets (2.5-5 mg total) by mouth every 8 (eight) hours as needed for severe pain (pain score 7-10). 30 tablet 0   phenazopyridine  (PYRIDIUM ) 95 MG tablet Take 1 tablet (95 mg total) by mouth 3 (three) times daily as needed for pain. 10 tablet 0   rivaroxaban (XARELTO) 20 MG TABS tablet Take 20 mg by mouth daily with supper.     abiraterone  acetate (ZYTIGA ) 250 MG tablet Take 4 tablets (1,000 mg total) by mouth daily. Take on an empty stomach 1 hour before or 2 hours after a meal (Patient not taking: Reported on 09/20/2023) 120 tablet 2   amiodarone  (PACERONE ) 200 MG tablet Take 200 mg  by mouth daily.      amlodipine-atorvastatin (CADUET) 10-20 MG tablet Take 1 tablet by mouth daily.     lisinopril (PRINIVIL,ZESTRIL) 20 MG tablet Take 1 tablet by mouth daily. Reported on 08/12/2015 (Patient not taking: Reported on 09/17/2023)     predniSONE  (DELTASONE ) 5 MG tablet Take 1 tablet (5 mg total) by mouth daily with breakfast. (Patient not taking: Reported on 09/20/2023) 30 tablet 2   No current facility-administered medications for this visit.   Facility-Administered Medications Ordered in Other Visits  Medication Dose Route Frequency Provider Last Rate Last Admin   heparin  lock flush 100 unit/mL  500 Units Intracatheter PRN Jacobo Evalene PARAS, MD       sodium chloride  0.9 % injection 10 mL  10 mL Intracatheter PRN Makita Blow J, MD   10 mL at 10/19/14 1353   sodium chloride  flush (NS) 0.9 % injection 10 mL  10 mL Intracatheter PRN Avin Upperman J, MD       sodium chloride  flush (NS) 0.9 % injection 10 mL  10 mL Intravenous PRN Gordy Goar J, MD   10 mL at 08/13/17 1442    OBJECTIVE: Vitals:   09/20/23 1110  BP: 115/83  Pulse: 95  Resp: 20  Temp: 98.4 F (36.9 C)  SpO2: 98%       Body mass index is 26.24 kg/m.    ECOG FS:0 - Asymptomatic  General: Well-developed, well-nourished, no acute distress. Eyes: Pink conjunctiva, anicteric sclera. HEENT: Normocephalic, moist mucous membranes. Lungs: No audible wheezing or coughing. Heart: Regular rate and rhythm. Abdomen: Soft, nontender, no obvious distention. Musculoskeletal: No edema, cyanosis, or clubbing. Neuro: Alert, answering all questions appropriately. Cranial nerves grossly intact. Skin: No rashes or petechiae noted. Psych: Normal affect.   LAB RESULTS:  Lab Results  Component Value Date   NA 132 (L) 09/20/2023   K 3.9 09/20/2023   CL 100 09/20/2023   CO2 19 (L) 09/20/2023   GLUCOSE 124 (H) 09/20/2023   BUN 30 (H) 09/20/2023   CREATININE 1.72 (H) 09/20/2023   CALCIUM  9.2 09/20/2023    PROT 7.4 09/20/2023   ALBUMIN 3.1 (L) 09/20/2023   AST 43 (H) 09/20/2023   ALT 18 09/20/2023   ALKPHOS 303 (H) 09/20/2023   BILITOT 0.9 09/20/2023   GFRNONAA 42 (L) 09/20/2023   GFRAA 45 (L) 05/29/2019    Lab Results  Component Value Date   WBC 6.9 09/20/2023   NEUTROABS 5.6 09/20/2023   HGB 11.2 (L) 09/20/2023   HCT 34.4 (L) 09/20/2023   MCV 83.1 09/20/2023   PLT 270 09/20/2023    STUDIES:    CT BONE TROCAR/NEEDLE BIOPSY DEEP Result Date: 09/17/2023 INDICATION: The patient has multiple sclerotic bone lesions throughout the skeleton and an FDG avid lymph node in the pelvis. The initial plan was for lymph node biopsy, however in the prone position, the lymph node is obscured by large vessels inhibiting direct access. I spoke directly with Dr. Jacobo about targeting a sclerotic bone lesion instead. The patient was then consented for this change in targeting. EXAM: CT-guided biopsy TECHNIQUE: Multidetector CT imaging of the pelvis was performed following the standard protocol without IV contrast. RADIATION DOSE REDUCTION: This exam was performed according to the departmental dose-optimization program which includes automated exposure control, adjustment of the mA and/or kV according to patient size and/or use of iterative reconstruction technique. MEDICATIONS: None. ANESTHESIA/SEDATION: Moderate (conscious) sedation was employed during this procedure. A total of Versed  1 mg and Fentanyl  50 mcg was administered intravenously by the radiology nurse. Total intra-service moderate Sedation Time: 24 minutes. The patient's level of consciousness and vital signs were monitored continuously by radiology nursing throughout the procedure under my direct supervision. COMPLICATIONS: None immediate. PROCEDURE: Informed written consent was obtained from the patient after a thorough discussion of the procedural risks, benefits and alternatives. All questions were addressed. Maximal Sterile Barrier Technique  was utilized including caps, mask, sterile gowns, sterile gloves, sterile drape, hand hygiene and skin antiseptic. A timeout was performed prior to the initiation of the procedure. The patient was placed in a prone position on the CT gantry initial axial images of the pelvis were obtained. The in tended FDG avid lymph node in the right hemipelvis is obscured by a large vascular pedicle. I called the patient's ordering physician and we discussed further potential for biopsying 1 of the sclerotic bone lesions and we were in agreement that  was the best second option. Consent was then performed for the change in biopsy target. Radiopaque markers placed on the patient's skin and repeat axial images were obtained. Measurements were obtained. The patient's skin was then marked, prepped, and draped in usual sterile fashion. Local anesthesia was achieved with 1% lidocaine . Small incision was made the patient's right gluteal region. A 13 gauge biopsy needle was then advanced towards the cortex of the right ilium. Once the cortex was engaged, the biopsy needle was then drilled into and through the sclerotic lesion. The biopsy needle was then retrieved and the sample was obtained. Sterile dressing applied. The pathology sample was sent to laboratory for further evaluation. IMPRESSION: Successful sclerotic bone lesion biopsy of the right ilium. Electronically Signed   By: Cordella Banner   On: 09/17/2023 13:14    ASSESSMENT: Stage IVb prostate cancer.  PLAN:    Stage IVb prostate cancer: Patient's PSA is 930. PSMA PET confirmed metastatic disease with positive the prostate gland, pelvic lymphadenopathy, and widespread bony metastasis.  Will get biopsy of one of his pelvic lymph nodes to confirm the diagnosis and obtain a Gleason score.  Patient will return to clinic after his biopsy to initiate Firmagon , Xgeva, and Zytiga .   History of stage IIIB adenocarcinoma of the desending colon:  Patient completed 12 cycles of  adjuvant FOLFOX on July 29, 2014.  Previously, patient CEA was chronically elevated ranging from 4.6-8.1 since at least October 2018.  His most recent CEA is 6.3.  He had a colonoscopy and upper endoscopy on January 21, 2019 that did not reveal any significant pathology or evidence of recurrent disease.  No further interventions are needed.   H/o DVT: Patient has discontinued Coumadin  and is now on Xarelto.  Managed by patient's primary care. Chronic renal insufficiency: Patient's most recent GFR is 41 which appears better than his baseline.  Monitor.   Anemia: Mild.  Patient's most recent hemoglobin is 12.2. Neuropathy: Patient does not complain of this today.    I spent a total of 30 minutes reviewing chart data, face-to-face evaluation with the patient, counseling and coordination of care as detailed above.  Patient expressed understanding and was in agreement with this plan. He also understands that He can call clinic at any time with any questions, concerns, or complaints.   Colon cancer   Staging form: Colon/Rectum, AJCC 7th Edition     Pathologic stage from 05/04/2014: Stage IIIB (T4, N1, M0) - Signed by Evalene JINNY Reusing, MD on 05/04/2014   Evalene JINNY Reusing, MD   09/20/2023 11:35 AM

## 2023-09-20 NOTE — Progress Notes (Signed)
 Clinical Pharmacist Practitioner Clinic [facility]  Telephone:([phone] [phone]  Patient Care Team: Center, [facility] as PCP - General Jinny Carmine, MD as Consulting Physician (Gastroenterology) Jacobo Evalene PARAS, MD as Consulting Physician (Oncology)   Name of the patient: James Keller  [id number]  1950-06-16   Date of visit: 09/20/23   HPI: Patient is a 73 y.o. male with newly diagnosed metastatic castartion sensitive prostate cancer. Planned treatment with ADT and abiraterone /prednisone   Reason for Consult: Abiraterone  oral chemotherapy education.   PAST MEDICAL HISTORY: Past Medical History:  Diagnosis Date   Colon cancer (HCC)    a. 11/2013 s/p colon resection/Hartmann's procedure;  b. Chemo: FOLFOX completed 07/2014.   DVT (deep venous thrombosis) (HCC)    RIGHT LEG/ HX OF   DVT (deep venous thrombosis) (HCC)    Dysrhythmia    Hypertension    Lower extremity edema    Rt leg   Neuromuscular disorder (HCC)    TINGLING IN FINGERS and feet   PAF (paroxysmal atrial fibrillation) (HCC)    S/P chemotherapy, time since less than 4 weeks    Tobacco abuse     HEMATOLOGY/ONCOLOGY HISTORY:  Oncology History  Prostate cancer metastatic to bone (HCC)  08/30/2023 Initial Diagnosis   Prostate cancer metastatic to bone (HCC)   08/30/2023 Cancer Staging   Staging form: Prostate, AJCC 8th Edition - Clinical stage from 08/30/2023: Stage IVB (cTX, cN1, cM1b, PSA: 930) - Signed by Jacobo Evalene PARAS, MD on 08/30/2023 Stage prefix: Initial diagnosis Prostate specific antigen (PSA) range: 20 or greater     ALLERGIES:  has no known allergies.  MEDICATIONS:  Current Outpatient Medications  Medication Sig Dispense Refill   abiraterone  acetate (ZYTIGA ) 250 MG tablet Take 4 tablets (1,000 mg total) by mouth daily. Take on an empty stomach 1 hour before or 2 hours after a meal (Patient not taking: Reported on 09/20/2023) 120 tablet 2    acetaminophen  (TYLENOL ) 500 MG tablet Take 500 mg by mouth every 6 (six) hours as needed.     amiodarone  (PACERONE ) 200 MG tablet Take 200 mg by mouth daily.      amLODipine (NORVASC) 10 MG tablet Take 1 tablet by mouth daily.     amlodipine-atorvastatin (CADUET) 10-20 MG tablet Take 1 tablet by mouth daily.     atorvastatin (LIPITOR) 40 MG tablet Take 40 mg by mouth daily.     gabapentin  (NEURONTIN ) 300 MG capsule Take 1 capsule (300 mg total) by mouth 3 (three) times daily. (Patient taking differently: Take 300 mg by mouth 3 (three) times daily. AS NEEDED) 90 capsule 1   lisinopril (PRINIVIL,ZESTRIL) 20 MG tablet Take 1 tablet by mouth daily. Reported on 08/12/2015 (Patient not taking: Reported on 09/17/2023)     lisinopril (ZESTRIL) 40 MG tablet Take 40 mg by mouth daily.     metoprolol  tartrate (LOPRESSOR ) 25 MG tablet Take 25 mg by mouth 2 (two) times daily. Am and pm     Oxycodone  HCl 10 MG TABS Take 1 tablet (10 mg total) by mouth every 4 (four) hours as needed. 60 tablet 0   phenazopyridine  (PYRIDIUM ) 95 MG tablet Take 1 tablet (95 mg total) by mouth 3 (three) times daily as needed for pain. 10 tablet 0   predniSONE  (DELTASONE ) 5 MG tablet Take 1 tablet (5 mg total) by mouth daily with breakfast. (Patient not taking: Reported on 09/20/2023) 30 tablet 2   rivaroxaban (XARELTO) 20 MG TABS tablet Take 20 mg by mouth  daily with supper.     No current facility-administered medications for this visit.   Facility-Administered Medications Ordered in Other Visits  Medication Dose Route Frequency Provider Last Rate Last Admin   0.9 %  sodium chloride  infusion   Intravenous Continuous Finnegan, Timothy J, MD   Stopped at 09/20/23 1249   heparin  lock flush 100 unit/mL  500 Units Intracatheter PRN Jacobo Evalene PARAS, MD       sodium chloride  0.9 % injection 10 mL  10 mL Intracatheter PRN Finnegan, Timothy J, MD   10 mL at 10/19/14 1353   sodium chloride  flush (NS) 0.9 % injection 10 mL  10 mL  Intracatheter PRN Jacobo Evalene PARAS, MD       sodium chloride  flush (NS) 0.9 % injection 10 mL  10 mL Intravenous PRN Finnegan, Timothy J, MD   10 mL at 08/13/17 1442    VITAL SIGNS: There were no vitals taken for this visit. There were no vitals filed for this visit.  Estimated body mass index is 26.24 kg/m as calculated from the following:   Height as of an earlier encounter on 09/20/23: 6' 1 (1.854 m).   Weight as of an earlier encounter on 09/20/23: 90.2 kg (198 lb 14.4 oz).  LABS: CBC:    Component Value Date/Time   WBC 6.9 09/20/2023 1048   HGB 11.2 (L) 09/20/2023 1048   HGB 13.4 05/12/2014 0856   HCT 34.4 (L) 09/20/2023 1048   HCT 41.0 05/12/2014 0856   PLT 270 09/20/2023 1048   PLT 148 (L) 05/12/2014 0856   MCV 83.1 09/20/2023 1048   MCV 86 05/12/2014 0856   NEUTROABS 5.6 09/20/2023 1048   NEUTROABS 4.2 05/12/2014 0856   LYMPHSABS 0.7 09/20/2023 1048   LYMPHSABS 1.8 05/12/2014 0856   MONOABS 0.5 09/20/2023 1048   MONOABS 0.5 05/12/2014 0856   EOSABS 0.0 09/20/2023 1048   EOSABS 0.1 05/12/2014 0856   BASOSABS 0.0 09/20/2023 1048   BASOSABS 0.1 05/12/2014 0856   Comprehensive Metabolic Panel:    Component Value Date/Time   NA 132 (L) 09/20/2023 1048   NA 139 05/12/2014 0856   K 3.9 09/20/2023 1048   K 3.3 (L) 05/12/2014 0856   CL 100 09/20/2023 1048   CL 105 05/12/2014 0856   CO2 19 (L) 09/20/2023 1048   CO2 26 05/12/2014 0856   BUN 30 (H) 09/20/2023 1048   BUN 18 05/12/2014 0856   CREATININE 1.72 (H) 09/20/2023 1048   CREATININE 1.58 (H) 05/12/2014 0856   GLUCOSE 124 (H) 09/20/2023 1048   GLUCOSE 143 (H) 05/12/2014 0856   CALCIUM  9.2 09/20/2023 1048   CALCIUM  9.2 05/12/2014 0856   AST 43 (H) 09/20/2023 1048   ALT 18 09/20/2023 1048   ALT 24 05/12/2014 0856   ALKPHOS 303 (H) 09/20/2023 1048   ALKPHOS 94 05/12/2014 0856   BILITOT 0.9 09/20/2023 1048   PROT 7.4 09/20/2023 1048   PROT 7.2 05/12/2014 0856   ALBUMIN 3.1 (L) 09/20/2023 1048   ALBUMIN  4.1 05/12/2014 0856     Present during today's visit: patient and his girlfriend  Start plan: Patient will start abiraterone  today   Patient Education I spoke with patient for overview of new oral chemotherapy medication: abiraterone    Treatment goal: Palliative  Administration: Counseled patient on administration, dosing, side effects, monitoring, drug-food interactions, safe handling, storage, and disposal. Patient will take:  Abiraterone : Take 4 tablets (1,000 mg total) by mouth daily. Take on an empty stomach 1 hour before  or 2 hours after a meal  Prednisone : Take 1 tablet (5 mg total) by mouth daily with breakfast.   Side Effects: Side effects include but not limited to: edema, fatigue, hypertension, decreased wbc .    Adherence: After discussion with patient no patient barriers to medication adherence identified.  Reviewed with patient importance of keeping a medication schedule and plan for any missed doses.  Distress evaluation: already completed by CPP at previous phone call  Communication and Learning Assessment Primary learner: patient Barriers to learning: No barriers Preferred language: English Learning preferences: Listening Reading  Mr. Oesterle voiced understanding and appreciation. All questions answered. Medication handout provided.  Provided patient with Oral Chemotherapy Navigation Clinic phone number. Patient knows to call the office with questions or concerns. Oral Chemotherapy Navigation Clinic will continue to follow.  Patient expressed understanding and was in agreement with this plan. He also understands that He can call clinic at any time with any questions, concerns, or complaints.   Medication Access Issues: No issue, patient fills at [facility]  Follow-up plan: RTC as scheduled  Thank you for allowing me to participate in the care of this patient.   Time Total: 20 mins  Visit consisted of counseling and education  on dealing with issues of symptom management in the setting of serious and potentially life-threatening illness.Greater than 50%  of this time was spent counseling and coordinating care related to the above assessment and plan.  Signed by: Dayyan Krist N. Kaedence Connelly, PharmD, NEILA, CPP Hematology/Oncology Clinical Pharmacist Practitioner [facility] [phone]  09/20/2023 4:12 PM

## 2023-09-20 NOTE — Progress Notes (Unsigned)
 Patient had a CT bone biopsy on 09/17/2023.

## 2023-09-20 NOTE — Progress Notes (Unsigned)
 Patient states he is out of pain medication.

## 2023-09-21 ENCOUNTER — Encounter: Payer: Self-pay | Admitting: Oncology

## 2023-09-25 ENCOUNTER — Telehealth: Payer: Self-pay | Admitting: Licensed Clinical Social Worker

## 2023-09-25 ENCOUNTER — Ambulatory Visit: Payer: Self-pay | Admitting: Licensed Clinical Social Worker

## 2023-09-25 ENCOUNTER — Encounter: Payer: Self-pay | Admitting: Licensed Clinical Social Worker

## 2023-09-25 DIAGNOSIS — Z1379 Encounter for other screening for genetic and chromosomal anomalies: Secondary | ICD-10-CM

## 2023-09-25 NOTE — Progress Notes (Signed)
 HPI:   [name] was previously seen in the [facility] due to a personal and family history of cancer and concerns regarding a hereditary predisposition to cancer. Please refer to our prior cancer genetics clinic note for more information regarding our discussion, assessment and recommendations, at the time. [name] recent genetic test results were disclosed to him, as were recommendations warranted by these results. These results and recommendations are discussed in more detail below.  CANCER HISTORY:  Oncology History  Prostate cancer metastatic to bone (HCC)  08/30/2023 Initial Diagnosis   Prostate cancer metastatic to bone (HCC)   08/30/2023 Cancer Staging   Staging form: Prostate, AJCC 8th Edition - Clinical stage from 08/30/2023: Stage IVB (cTX, cN1, cM1b, PSA: 930) - Signed by James Evalene PARAS, MD on 08/30/2023 Stage prefix: Initial diagnosis Prostate specific antigen (PSA) range: 20 or greater    Genetic Testing   Negative genetic testing. No pathogenic variants identified on the [facility] CancerNext+RNA panel. The report date is 09/22/2023.  The [facility] CancerNext+RNAinsight Panel includes sequencing, rearrangement analysis, and RNA analysis for the following 40 genes: APC, ATM, BAP1, BARD1, BMPR1A, BRCA1, BRCA2, BRIP1, CDH1, CDKN2A, CHEK2, FH, FLCN, MET, MLH1, MSH2, MSH6, MUTYH, NF1, NTHL1, PALB2, PMS2, PTEN, RAD51C, RAD51D, RPS20, SMAD4, STK11, TP53, TSC1, TSC2, and VHL (sequencing and deletion/duplication); AXIN2, HOXB13, MBD4, MSH3, POLD1 and POLE (sequencing only); EPCAM and GREM1 (deletion/duplication only).     FAMILY HISTORY:  We obtained a detailed, 4-generation family history.  Significant diagnoses are listed below: Family History  Problem Relation Age of Onset   Hypertension Mother    Liver cancer Paternal Grandfather    [name] has 1 son, 77, with history of skin cancer. He has 2 full sisters, 3 full brothers, 3 paternal half sisters,  no cancers.   [name] mother passed at 37. No known cancers on this side of the family.   [name] father passed over age 51. Patient's paternal grandmother had liver cancer and passed over 20.    [name] is unaware of previous family history of genetic testing for hereditary cancer risks. There is no reported Ashkenazi Jewish ancestry. There is no known consanguinity.      GENETIC TEST RESULTS:  The [facility] CancerNext+RNA Panel found no pathogenic mutations.   The [facility] CancerNext+RNAinsight Panel includes sequencing, rearrangement analysis, and RNA analysis for the following 40 genes: APC, ATM, BAP1, BARD1, BMPR1A, BRCA1, BRCA2, BRIP1, CDH1, CDKN2A, CHEK2, FH, FLCN, MET, MLH1, MSH2, MSH6, MUTYH, NF1, NTHL1, PALB2, PMS2, PTEN, RAD51C, RAD51D, RPS20, SMAD4, STK11, TP53, TSC1, TSC2, and VHL (sequencing and deletion/duplication); AXIN2, HOXB13, MBD4, MSH3, POLD1 and POLE (sequencing only); EPCAM and GREM1 (deletion/duplication only).   The test report has been scanned into EPIC and is located under the Molecular Pathology section of the Results Review tab.  A portion of the result report is included below for reference. Genetic testing reported out on 09/22/2023.     ADDITIONAL GENETIC TESTING:  We discussed with [name] that his genetic testing was fairly extensive.  If there are additional relevant genes identified to increase cancer risk that can be analyzed in the future, we would be happy to discuss and coordinate this testing at that time.    CANCER SCREENING RECOMMENDATIONS:  [name] test result is considered negative (normal).  This means that we have not identified a hereditary cause for his personal and family history of cancer at this time.   An individual's cancer risk and medical management  are not determined by genetic test results alone. Overall cancer risk assessment incorporates additional factors, including personal medical history, family  history, and any available genetic information that may result in a personalized plan for cancer prevention and surveillance. Therefore, it is recommended he continue to follow the cancer management and screening guidelines provided by his oncology and primary healthcare provider.   RECOMMENDATIONS FOR FAMILY MEMBERS:   Since he did not inherit a identifiable mutation in a cancer predisposition gene included on this panel, his children could not have inherited a known mutation from him in one of these genes. Individuals in this family might be at some increased risk of developing cancer, over the general population risk, due to the family history of cancer.  Individuals in the family should notify their providers of the family history of cancer. We recommend women in this family have a yearly mammogram beginning at age 46, or 68 years younger than the earliest onset of cancer, an annual clinical breast exam, and perform monthly breast self-exams.  Family members should have colonoscopies by at age 69, or earlier, as recommended by their providers.   FOLLOW-UP:  Lastly, we discussed with [name] that cancer genetics is a rapidly advancing field and it is possible that new genetic tests will be appropriate for him and/or his family members in the future. We encouraged him to remain in contact with cancer genetics on an annual basis so we can update his personal and family histories and let him know of advances in cancer genetics that may benefit this family.   Our contact number was provided. Mr. Challis questions were answered to his satisfaction, and he knows he is welcome to call us  at anytime with additional questions or concerns.    Dena Cary, MS, [facility] Genetic Counselor [email or web address] Phone: [phone]

## 2023-09-25 NOTE — Telephone Encounter (Signed)
 I contacted Mr. Provence to discuss his genetic testing results. No pathogenic variants were identified in the 40 genes analyzed. Detailed clinic note to follow.   The test report has been scanned into EPIC and is located under the Molecular Pathology section of the Results Review tab.  A portion of the result report is included below for reference.      Dena Cary, MS, [facility] Genetic Counselor [email or web address] Phone: [phone]

## 2023-09-27 ENCOUNTER — Other Ambulatory Visit: Payer: Self-pay

## 2023-10-01 ENCOUNTER — Other Ambulatory Visit: Payer: Self-pay

## 2023-10-03 ENCOUNTER — Other Ambulatory Visit: Payer: Self-pay

## 2023-10-09 ENCOUNTER — Other Ambulatory Visit: Payer: Self-pay

## 2023-10-11 ENCOUNTER — Other Ambulatory Visit: Payer: Self-pay

## 2023-10-11 NOTE — Progress Notes (Signed)
 Specialty Pharmacy Ongoing Clinical Assessment Note  James Keller is a 73 y.o. male who is being followed by the specialty pharmacy service for RxSp Oncology   Patient's specialty medication(s) reviewed today: Abiraterone  Acetate (ZYTIGA )   Missed doses in the last 4 weeks: 0   Patient/Caregiver did not have any additional questions or concerns.   Therapeutic benefit summary: Unable to assess   Adverse events/side effects summary: No adverse events/side effects   Patient's therapy is appropriate to: Continue    Goals Addressed             This Visit's Progress    Slow Disease Progression       Patient is unable to be assessed as therapy was recently initiated. Patient will maintain adherence         Follow up: 3 months  [facility] Specialty Pharmacist

## 2023-10-11 NOTE — Progress Notes (Signed)
 Specialty Pharmacy Refill Coordination Note  James Keller is a 73 y.o. male contacted today regarding refills of specialty medication(s) Abiraterone  Acetate (ZYTIGA )   Patient requested Delivery   Delivery date: 10/12/23   Verified address: [address] [place] [address]   Medication will be filled on 10/11/23.

## 2023-10-15 ENCOUNTER — Other Ambulatory Visit: Payer: Self-pay

## 2023-10-15 ENCOUNTER — Telehealth: Payer: Self-pay | Admitting: *Deleted

## 2023-10-15 NOTE — Telephone Encounter (Signed)
 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  He wants to know what to do about this rash if need to for him not to work out in the hot sun. I spoke to alyson and it has 4 % so per Alyson she feels probably not. I spoke to [place] and he said get a [facility].

## 2023-10-15 NOTE — Progress Notes (Signed)
 Clinical Intervention Note  Clinical Intervention Notes: The patient's wife called with concerns about an itchy, blister-like rash on his chest. Upon further questioning, it was noted that the patient has been working outdoors in the heat, making it difficult to determine whether the rash is a side effect of Zytiga  or a heat-related reaction. I advised her to contact Dr. Jerone office for further evaluation and provided the office phone number. The patient is scheduled for an appointment on Thursday. In the meantime, they are using over-the-counter Benadryl  and hydrocortisone cream at home, which has helped relieve the itching.   No data recorded  Silvano LOISE Dolly Specialty Pharmacist

## 2023-10-15 NOTE — Telephone Encounter (Signed)
 I called the wife and she says that he can come in tomorrow at 9:30. He says he can get there then... I will see what Jacobo if he wants to stop zytiga  and prednisone . And I will call with meds

## 2023-10-16 ENCOUNTER — Inpatient Hospital Stay: Attending: Oncology | Admitting: Hospice and Palliative Medicine

## 2023-10-16 ENCOUNTER — Encounter: Payer: Self-pay | Admitting: Hospice and Palliative Medicine

## 2023-10-16 VITALS — BP 120/81 | HR 98 | Temp 97.6°F | Resp 16 | Wt 196.5 lb

## 2023-10-16 DIAGNOSIS — Z85038 Personal history of other malignant neoplasm of large intestine: Secondary | ICD-10-CM | POA: Diagnosis not present

## 2023-10-16 DIAGNOSIS — Z5111 Encounter for antineoplastic chemotherapy: Secondary | ICD-10-CM | POA: Diagnosis present

## 2023-10-16 DIAGNOSIS — C61 Malignant neoplasm of prostate: Secondary | ICD-10-CM | POA: Diagnosis present

## 2023-10-16 DIAGNOSIS — D649 Anemia, unspecified: Secondary | ICD-10-CM | POA: Insufficient documentation

## 2023-10-16 DIAGNOSIS — C7951 Secondary malignant neoplasm of bone: Secondary | ICD-10-CM | POA: Diagnosis not present

## 2023-10-16 DIAGNOSIS — D696 Thrombocytopenia, unspecified: Secondary | ICD-10-CM | POA: Insufficient documentation

## 2023-10-16 DIAGNOSIS — Z79899 Other long term (current) drug therapy: Secondary | ICD-10-CM | POA: Insufficient documentation

## 2023-10-16 DIAGNOSIS — R21 Rash and other nonspecific skin eruption: Secondary | ICD-10-CM | POA: Insufficient documentation

## 2023-10-16 MED ORDER — PREDNISONE 10 MG (21) PO TBPK: ORAL_TABLET | ORAL | 0 refills | Status: DC

## 2023-10-16 NOTE — Progress Notes (Signed)
 Symptom Management Clinic [facility] at [facility] [phone] [phone]  Patient Care Team: Center, [facility] as PCP - General Jinny Carmine, MD as Consulting Physician (Gastroenterology) Jacobo Evalene PARAS, MD as Consulting Physician (Oncology)   NAME OF PATIENT: James Keller  [id number]  [birth year]   DATE OF VISIT: 10/16/23  REASON FOR CONSULT: James Keller is a 73 y.o. male with multiple medical problems including stage IVb adenocarcinoma of the prostate with widespread bony metastasis and pelvic lymphadenopathy.   INTERVAL HISTORY: Patient on treatment with Firmagon , Zometa , and Zytiga .  Patient presents [facility] today for evaluation of a rash.  Patient reports that he has 5 days of an itchy red rash on his upper chest and part of his left arm.  Rash has not spread but is not significantly improved with topical hydrocortisone cream.  Patient denies any changes in medications or creams.  Of note, he does work outside with trees and plants.  Denies any neurologic complaints. Denies recent fevers or illnesses. Denies any easy bleeding or bruising. Reports good appetite and denies weight loss. Denies chest pain. Denies any nausea, vomiting, constipation, or diarrhea. Denies urinary complaints. Patient offers no further specific complaints today.   PAST MEDICAL HISTORY: Past Medical History:  Diagnosis Date   Colon cancer (HCC)    a. 11/2013 s/p colon resection/Hartmann's procedure;  b. Chemo: FOLFOX completed 07/2014.   DVT (deep venous thrombosis) (HCC)    RIGHT LEG/ HX OF   DVT (deep venous thrombosis) (HCC)    Dysrhythmia    Hypertension    Lower extremity edema    Rt leg   Neuromuscular disorder (HCC)    TINGLING IN FINGERS and feet   PAF (paroxysmal atrial fibrillation) (HCC)    S/P chemotherapy, time since less than 4 weeks    Tobacco abuse     PAST SURGICAL HISTORY:  Past Surgical History:   Procedure Laterality Date   COLON RESECTION  12/19/2013   colostomy   COLON SURGERY     COLONOSCOPY WITH PROPOFOL  N/A 09/14/2014   Procedure: COLONOSCOPY WITH PROPOFOL  THROUGH COLOSTOMY AND COLONOSCOPY THRU RECTUM;  Surgeon: Carmine Jinny, MD;  Location: [facility];  Service: Endoscopy;  Laterality: N/A;  TRANSVERSE COLON POLYP AND SIGMOID COLON POLYP   COLONOSCOPY WITH PROPOFOL  N/A 11/29/2015   Procedure: COLONOSCOPY WITH PROPOFOL ;  Surgeon: Ruel Kung, MD;  Location: [facility] ENDOSCOPY;  Service: Endoscopy;  Laterality: N/A;   COLONOSCOPY WITH PROPOFOL  N/A 01/21/2019   Procedure: COLONOSCOPY WITH PROPOFOL ;  Surgeon: Jinny Carmine, MD;  Location: [facility] END[facility]COPY;  Service: Endoscopy;  Laterality: N/A;   COLOSTOMY REVERSAL N/A 10/28/2014   Procedure: COLOSTOMY REVERSAL;  Surgeon: Lonni Brands, MD;  Location: [facility] ORS;  Service: General;  Laterality: N/A;   ESOPHAGOGASTRODUODENOSCOPY (EGD) WITH PROPOFOL  N/A 01/21/2019   Procedure: ESOPHAGOGASTRODUODENOSCOPY (EGD) WITH PROPOFOL ;  Surgeon: Jinny Carmine, MD;  Location: [facility] ENDOSCOPY;  Service: Endoscopy;  Laterality: N/A;   FEMUR FRACTURE SURGERY Right    FRACTURE SURGERY     LEG SURGERY     LYSIS OF ADHESION  10/28/2014   Procedure: LYSIS OF ADHESION;  Surgeon: Lonni Brands, MD;  Location: [facility] ORS;  Service: General;;   PORT A CATH INJECTION ([facility] HX) Right    TUNNELED VENOUS PORT PLACEMENT      HEMATOLOGY/ONCOLOGY HISTORY:  Oncology History  Prostate cancer metastatic to bone (HCC)  08/30/2023 Initial Diagnosis   Prostate cancer metastatic to bone (HCC)   08/30/2023 Cancer Staging  Staging form: Prostate, AJCC 8th Edition - Clinical stage from 08/30/2023: Stage IVB (cTX, cN1, cM1b, PSA: 930) - Signed by Jacobo Evalene PARAS, MD on 08/30/2023 Stage prefix: Initial diagnosis Prostate specific antigen (PSA) range: 20 or greater    Genetic Testing   Negative genetic testing. No pathogenic variants identified on the [facility]  CancerNext+RNA panel. The report date is 09/22/2023.  The [facility] CancerNext+RNAinsight Panel includes sequencing, rearrangement analysis, and RNA analysis for the following 40 genes: APC, ATM, BAP1, BARD1, BMPR1A, BRCA1, BRCA2, BRIP1, CDH1, CDKN2A, CHEK2, FH, FLCN, MET, MLH1, MSH2, MSH6, MUTYH, NF1, NTHL1, PALB2, PMS2, PTEN, RAD51C, RAD51D, RPS20, SMAD4, STK11, TP53, TSC1, TSC2, and VHL (sequencing and deletion/duplication); AXIN2, HOXB13, MBD4, MSH3, POLD1 and POLE (sequencing only); EPCAM and GREM1 (deletion/duplication only).     ALLERGIES:  has no known allergies.  MEDICATIONS:  Current Outpatient Medications  Medication Sig Dispense Refill   abiraterone  acetate (ZYTIGA ) 250 MG tablet Take 4 tablets (1,000 mg total) by mouth daily. Take on an empty stomach 1 hour before or 2 hours after a meal (Patient not taking: Reported on 09/20/2023) 120 tablet 2   acetaminophen  (TYLENOL ) 500 MG tablet Take 500 mg by mouth every 6 (six) hours as needed.     amiodarone  (PACERONE ) 200 MG tablet Take 200 mg by mouth daily.      amLODipine (NORVASC) 10 MG tablet Take 1 tablet by mouth daily.     amlodipine-atorvastatin (CADUET) 10-20 MG tablet Take 1 tablet by mouth daily.     atorvastatin (LIPITOR) 40 MG tablet Take 40 mg by mouth daily.     gabapentin  (NEURONTIN ) 300 MG capsule Take 1 capsule (300 mg total) by mouth 3 (three) times daily. (Patient taking differently: Take 300 mg by mouth 3 (three) times daily. AS NEEDED) 90 capsule 1   lisinopril (PRINIVIL,ZESTRIL) 20 MG tablet Take 1 tablet by mouth daily. Reported on 08/12/2015 (Patient not taking: Reported on 09/17/2023)     lisinopril (ZESTRIL) 40 MG tablet Take 40 mg by mouth daily.     metoprolol  tartrate (LOPRESSOR ) 25 MG tablet Take 25 mg by mouth 2 (two) times daily. Am and pm     Oxycodone  HCl 10 MG TABS Take 1 tablet (10 mg total) by mouth every 4 (four) hours as needed. 60 tablet 0   phenazopyridine  (PYRIDIUM ) 95 MG tablet Take 1 tablet (95 mg  total) by mouth 3 (three) times daily as needed for pain. 10 tablet 0   predniSONE  (DELTASONE ) 5 MG tablet Take 1 tablet (5 mg total) by mouth daily with breakfast. (Patient not taking: Reported on 09/20/2023) 30 tablet 2   rivaroxaban (XARELTO) 20 MG TABS tablet Take 20 mg by mouth daily with supper.     No current facility-administered medications for this visit.   Facility-Administered Medications Ordered in Other Visits  Medication Dose Route Frequency Provider Last Rate Last Admin   heparin  lock flush 100 unit/mL  500 Units Intracatheter PRN Jacobo Evalene PARAS, MD       sodium chloride  0.9 % injection 10 mL  10 mL Intracatheter PRN Finnegan, Timothy J, MD   10 mL at 10/19/14 1353   sodium chloride  flush (NS) 0.9 % injection 10 mL  10 mL Intracatheter PRN Jacobo Evalene PARAS, MD       sodium chloride  flush (NS) 0.9 % injection 10 mL  10 mL Intravenous PRN Finnegan, Timothy J, MD   10 mL at 08/13/17 1442    VITAL SIGNS: There were no vitals taken for  this visit. There were no vitals filed for this visit.  Estimated body mass index is 26.24 kg/m as calculated from the following:   Height as of 09/20/23: 6' 1 (1.854 m).   Weight as of 09/20/23: 198 lb 14.4 oz (90.2 kg).  LABS: CBC:    Component Value Date/Time   WBC 6.9 09/20/2023 1048   HGB 11.2 (L) 09/20/2023 1048   HGB 13.4 05/12/2014 0856   HCT 34.4 (L) 09/20/2023 1048   HCT 41.0 05/12/2014 0856   PLT 270 09/20/2023 1048   PLT 148 (L) 05/12/2014 0856   MCV 83.1 09/20/2023 1048   MCV 86 05/12/2014 0856   NEUTROABS 5.6 09/20/2023 1048   NEUTROABS 4.2 05/12/2014 0856   LYMPHSABS 0.7 09/20/2023 1048   LYMPHSABS 1.8 05/12/2014 0856   MONOABS 0.5 09/20/2023 1048   MONOABS 0.5 05/12/2014 0856   EOSABS 0.0 09/20/2023 1048   EOSABS 0.1 05/12/2014 0856   BASOSABS 0.0 09/20/2023 1048   BASOSABS 0.1 05/12/2014 0856   Comprehensive Metabolic Panel:    Component Value Date/Time   NA 132 (L) 09/20/2023 1048   NA 139 05/12/2014  0856   K 3.9 09/20/2023 1048   K 3.3 (L) 05/12/2014 0856   CL 100 09/20/2023 1048   CL 105 05/12/2014 0856   CO2 19 (L) 09/20/2023 1048   CO2 26 05/12/2014 0856   BUN 30 (H) 09/20/2023 1048   BUN 18 05/12/2014 0856   CREATININE 1.72 (H) 09/20/2023 1048   CREATININE 1.58 (H) 05/12/2014 0856   GLUCOSE 124 (H) 09/20/2023 1048   GLUCOSE 143 (H) 05/12/2014 0856   CALCIUM  9.2 09/20/2023 1048   CALCIUM  9.2 05/12/2014 0856   AST 43 (H) 09/20/2023 1048   ALT 18 09/20/2023 1048   ALT 24 05/12/2014 0856   ALKPHOS 303 (H) 09/20/2023 1048   ALKPHOS 94 05/12/2014 0856   BILITOT 0.9 09/20/2023 1048   PROT 7.4 09/20/2023 1048   PROT 7.2 05/12/2014 0856   ALBUMIN 3.1 (L) 09/20/2023 1048   ALBUMIN 4.1 05/12/2014 0856    RADIOGRAPHIC STUDIES: CT BONE TROCAR/NEEDLE BIOPSY DEEP Result Date: 09/17/2023 INDICATION: The patient has multiple sclerotic bone lesions throughout the skeleton and an FDG avid lymph node in the pelvis. The initial plan was for lymph node biopsy, however in the prone position, the lymph node is obscured by large vessels inhibiting direct access. I spoke directly with Dr. Jacobo about targeting a sclerotic bone lesion instead. The patient was then consented for this change in targeting. EXAM: CT-guided biopsy TECHNIQUE: Multidetector CT imaging of the pelvis was performed following the standard protocol without IV contrast. RADIATION DOSE REDUCTION: This exam was performed according to the departmental dose-optimization program which includes automated exposure control, adjustment of the mA and/or kV according to patient size and/or use of iterative reconstruction technique. MEDICATIONS: None. ANESTHESIA/SEDATION: Moderate (conscious) sedation was employed during this procedure. A total of Versed  1 mg and Fentanyl  50 mcg was administered intravenously by the radiology nurse. Total intra-service moderate Sedation Time: 24 minutes. The patient's level of consciousness and vital signs  were monitored continuously by radiology nursing throughout the procedure under my direct supervision. COMPLICATIONS: None immediate. PROCEDURE: Informed written consent was obtained from the patient after a thorough discussion of the procedural risks, benefits and alternatives. All questions were addressed. Maximal Sterile Barrier Technique was utilized including caps, mask, sterile gowns, sterile gloves, sterile drape, hand hygiene and skin antiseptic. A timeout was performed prior to the initiation of the procedure. The patient  was placed in a prone position on the CT gantry initial axial images of the pelvis were obtained. The in tended FDG avid lymph node in the right hemipelvis is obscured by a large vascular pedicle. I called the patient's ordering physician and we discussed further potential for biopsying 1 of the sclerotic bone lesions and we were in agreement that was the best second option. Consent was then performed for the change in biopsy target. Radiopaque markers placed on the patient's skin and repeat axial images were obtained. Measurements were obtained. The patient's skin was then marked, prepped, and draped in usual sterile fashion. Local anesthesia was achieved with 1% lidocaine . Small incision was made the patient's right gluteal region. A 13 gauge biopsy needle was then advanced towards the cortex of the right ilium. Once the cortex was engaged, the biopsy needle was then drilled into and through the sclerotic lesion. The biopsy needle was then retrieved and the sample was obtained. Sterile dressing applied. The pathology sample was sent to laboratory for further evaluation. IMPRESSION: Successful sclerotic bone lesion biopsy of the right ilium. Electronically Signed   By: Cordella Banner   On: 09/17/2023 13:14    PERFORMANCE STATUS (ECOG) : 1 - Symptomatic but completely ambulatory  Review of Systems Unless otherwise noted, a complete review of systems is negative.  Physical  Exam General: NAD Cardiovascular: regular rate and rhythm Pulmonary: clear ant fields Abdomen: soft, nontender, + bowel sounds GU: no suprapubic tenderness Extremities: no edema, no joint deformities Skin: Red rash chest Neurological: Weakness but otherwise nonfocal    IMPRESSION/PLAN: Prostate cancer -continue treatment with Firmagon , Zometa , and Zytiga .  Rash -Zytiga  has incidence of rash in 8% of patients.  However, patient has been on Zytiga  almost 2 months without issue and so timing would be unusual.  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  Will start patient on prednisone  taper.  Case and plan discussed with Dr. Jacobo.  MD follow-up later this week   Patient expressed understanding and was in agreement with this plan. He also understands that He can call clinic at any time with any questions, concerns, or complaints.   Thank you for allowing me to participate in the care of this very pleasant patient.   Time Total: 15 minutes  Visit consisted of counseling and education dealing with the complex and emotionally intense issues of symptom management in the setting of serious illness.Greater than 50%  of this time was spent counseling and coordinating care related to the above assessment and plan.  Signed by: Fonda Mower, PhD, NP-C

## 2023-10-18 ENCOUNTER — Other Ambulatory Visit: Payer: Self-pay | Admitting: *Deleted

## 2023-10-18 ENCOUNTER — Inpatient Hospital Stay

## 2023-10-18 ENCOUNTER — Inpatient Hospital Stay: Admitting: Pharmacist

## 2023-10-18 ENCOUNTER — Other Ambulatory Visit: Payer: Self-pay

## 2023-10-18 ENCOUNTER — Encounter: Payer: Self-pay | Admitting: Oncology

## 2023-10-18 ENCOUNTER — Inpatient Hospital Stay: Admitting: Oncology

## 2023-10-18 VITALS — BP 122/70 | HR 59 | Temp 97.7°F | Resp 18 | Ht 73.0 in | Wt 196.0 lb

## 2023-10-18 DIAGNOSIS — C7951 Secondary malignant neoplasm of bone: Secondary | ICD-10-CM | POA: Diagnosis not present

## 2023-10-18 DIAGNOSIS — C61 Malignant neoplasm of prostate: Secondary | ICD-10-CM

## 2023-10-18 DIAGNOSIS — D649 Anemia, unspecified: Secondary | ICD-10-CM | POA: Diagnosis not present

## 2023-10-18 DIAGNOSIS — Z5111 Encounter for antineoplastic chemotherapy: Secondary | ICD-10-CM | POA: Diagnosis not present

## 2023-10-18 LAB — CBC WITH DIFFERENTIAL/PLATELET
Abs Immature Granulocytes: 0.02 K/uL (ref 0.00–0.07)
Basophils Absolute: 0 K/uL (ref 0.0–0.1)
Basophils Relative: 1 %
Eosinophils Absolute: 0 K/uL (ref 0.0–0.5)
Eosinophils Relative: 1 %
HCT: 26.7 % — ABNORMAL LOW (ref 39.0–52.0)
Hemoglobin: 8.4 g/dL — ABNORMAL LOW (ref 13.0–17.0)
Immature Granulocytes: 1 %
Lymphocytes Relative: 40 %
Lymphs Abs: 1.6 K/uL (ref 0.7–4.0)
MCH: 27.1 pg (ref 26.0–34.0)
MCHC: 31.5 g/dL (ref 30.0–36.0)
MCV: 86.1 fL (ref 80.0–100.0)
Monocytes Absolute: 0.3 K/uL (ref 0.1–1.0)
Monocytes Relative: 7 %
Neutro Abs: 2 K/uL (ref 1.7–7.7)
Neutrophils Relative %: 50 %
Platelets: 133 K/uL — ABNORMAL LOW (ref 150–400)
RBC: 3.1 MIL/uL — ABNORMAL LOW (ref 4.22–5.81)
RDW: 19.2 % — ABNORMAL HIGH (ref 11.5–15.5)
WBC: 4 K/uL (ref 4.0–10.5)
nRBC: 0 % (ref 0.0–0.2)

## 2023-10-18 LAB — IRON AND TIBC
Iron: 70 ug/dL (ref 45–182)
Saturation Ratios: 29 % (ref 17.9–39.5)
TIBC: 241 ug/dL — ABNORMAL LOW (ref 250–450)
UIBC: 171 ug/dL

## 2023-10-18 LAB — CMP (CANCER CENTER ONLY)
ALT: 14 U/L (ref 0–44)
AST: 17 U/L (ref 15–41)
Albumin: 3.4 g/dL — ABNORMAL LOW (ref 3.5–5.0)
Alkaline Phosphatase: 607 U/L — ABNORMAL HIGH (ref 38–126)
Anion gap: 5 (ref 5–15)
BUN: 26 mg/dL — ABNORMAL HIGH (ref 8–23)
CO2: 21 mmol/L — ABNORMAL LOW (ref 22–32)
Calcium: 8.2 mg/dL — ABNORMAL LOW (ref 8.9–10.3)
Chloride: 110 mmol/L (ref 98–111)
Creatinine: 1.57 mg/dL — ABNORMAL HIGH (ref 0.61–1.24)
GFR, Estimated: 47 mL/min — ABNORMAL LOW (ref >60)
Glucose, Bld: 92 mg/dL (ref 70–99)
Potassium: 4 mmol/L (ref 3.5–5.1)
Sodium: 136 mmol/L (ref 135–145)
Total Bilirubin: 0.8 mg/dL (ref 0.0–1.2)
Total Protein: 6.1 g/dL — ABNORMAL LOW (ref 6.5–8.1)

## 2023-10-18 LAB — FERRITIN: Ferritin: 349 ng/mL — ABNORMAL HIGH (ref 24–336)

## 2023-10-18 LAB — LACTATE DEHYDROGENASE: LDH: 167 U/L (ref 98–192)

## 2023-10-18 LAB — FOLATE: Folate: 6.4 ng/mL (ref >5.9)

## 2023-10-18 LAB — VITAMIN B12: Vitamin B-12: 259 pg/mL (ref 180–914)

## 2023-10-18 LAB — PSA: Prostatic Specific Antigen: 132.82 ng/mL — ABNORMAL HIGH (ref 0.00–4.00)

## 2023-10-18 MED ORDER — SODIUM CHLORIDE 0.9 % IV SOLN
INTRAVENOUS | Status: DC
  Filled 2023-10-18: qty 250

## 2023-10-18 MED ORDER — ZOLEDRONIC ACID 4 MG/5ML IV CONC
3.5000 mg | Freq: Once | INTRAVENOUS | Status: AC
  Administered 2023-10-18: 3.5 mg via INTRAVENOUS
  Filled 2023-10-18: qty 4.38

## 2023-10-18 MED ORDER — DEGARELIX ACETATE 80 MG ~~LOC~~ SOLR
80.0000 mg | Freq: Once | SUBCUTANEOUS | Status: AC
  Administered 2023-10-18: 80 mg via SUBCUTANEOUS
  Filled 2023-10-18: qty 4

## 2023-10-18 NOTE — Progress Notes (Signed)
 Patient felt a little bit dizzy last night, he just started on a prednisone  taper pack yesterday.

## 2023-10-18 NOTE — Patient Instructions (Signed)
 Zoledronic  Acid Injection (Cancer) What is this medication? ZOLEDRONIC  ACID (ZOE le dron ik AS id) treats high calcium  levels in the blood caused by cancer. It may also be used with chemotherapy to treat weakened bones caused by cancer. It works by slowing down the release of calcium  from bones. This lowers calcium  levels in your blood. It also makes your bones stronger and less likely to break (fracture). It belongs to a group of medications called bisphosphonates. This medicine may be used for other purposes; ask your health care provider or pharmacist if you have questions. COMMON BRAND NAME(S): Zometa , Zometa  Powder What should I tell my care team before I take this medication? They need to know if you have any of these conditions: Dehydration Dental disease Kidney disease Liver disease Low levels of calcium  in the blood Lung or breathing disease, such as asthma Receiving steroids, such as dexamethasone  or prednisone  An unusual or allergic reaction to zoledronic  acid, other medications, foods, dyes, or preservatives Pregnant or trying to get pregnant Breast-feeding How should I use this medication? This medication is injected into a vein. It is given by your care team in a hospital or clinic setting. Talk to your care team about the use of this medication in children. Special care may be needed. Overdosage: If you think you have taken too much of this medicine contact a poison control center or emergency room at once. NOTE: This medicine is only for you. Do not share this medicine with others. What if I miss a dose? Keep appointments for follow-up doses. It is important not to miss your dose. Call your care team if you are unable to keep an appointment. What may interact with this medication? Certain antibiotics given by injection Diuretics, such as bumetanide, furosemide  NSAIDs, medications for pain and inflammation, such as ibuprofen or naproxen Teriparatide Thalidomide This list  may not describe all possible interactions. Give your health care provider a list of all the medicines, herbs, non-prescription drugs, or dietary supplements you use. Also tell them if you smoke, drink alcohol, or use illegal drugs. Some items may interact with your medicine. What should I watch for while using this medication? Visit your care team for regular checks on your progress. It may be some time before you see the benefit from this medication. Some people who take this medication have severe bone, joint, or muscle pain. This medication may also increase your risk for jaw problems or a broken thigh bone. Tell your care team right away if you have severe pain in your jaw, bones, joints, or muscles. Tell you care team if you have any pain that does not go away or that gets worse. Tell your dentist and dental surgeon that you are taking this medication. You should not have major dental surgery while on this medication. See your dentist to have a dental exam and fix any dental problems before starting this medication. Take good care of your teeth while on this medication. Make sure you see your dentist for regular follow-up appointments. You should make sure you get enough calcium  and vitamin D while you are taking this medication. Discuss the foods you eat and the vitamins you take with your care team. Check with your care team if you have severe diarrhea, nausea, and vomiting, or if you sweat a lot. The loss of too much body fluid may make it dangerous for you to take this medication. You may need bloodwork while taking this medication. Talk to your care team if  you wish to become pregnant or think you might be pregnant. This medication can cause serious birth defects. What side effects may I notice from receiving this medication? Side effects that you should report to your care team as soon as possible: Allergic reactions--skin rash, itching, hives, swelling of the face, lips, tongue, or  throat Kidney injury--decrease in the amount of urine, swelling of the ankles, hands, or feet Low calcium  level--muscle pain or cramps, confusion, tingling, or numbness in the hands or feet Osteonecrosis of the jaw--pain, swelling, or redness in the mouth, numbness of the jaw, poor healing after dental work, unusual discharge from the mouth, visible bones in the mouth Severe bone, joint, or muscle pain Side effects that usually do not require medical attention (report to your care team if they continue or are bothersome): Constipation Fatigue Fever Loss of appetite Nausea Stomach pain This list may not describe all possible side effects. Call your doctor for medical advice about side effects. You may report side effects to FDA at 1-800-FDA-1088. Where should I keep my medication? This medication is given in a hospital or clinic. It will not be stored at home. NOTE: This sheet is a summary. It may not cover all possible information. If you have questions about this medicine, talk to your doctor, pharmacist, or health care provider.  2024 Elsevier/Gold Standard (2021-03-04 00:00:00)  Degarelix  Injection What is this medication? DEGARELIX  (deg a REL ix) treats prostate cancer. It works by decreasing levels of the hormone testosterone in the body. This prevents prostate cancer cells from spreading or growing. It belongs to a group of medications called GnRH blockers. This medicine may be used for other purposes; ask your health care provider or pharmacist if you have questions. COMMON BRAND NAME(S): Degarelix , Firmagon  What should I tell my care team before I take this medication? They need to know if you have any of these conditions: Diabetes Heart disease Kidney disease Liver disease Low levels of potassium or magnesium in the blood Osteoporosis, weak bones An unusual or allergic reaction to degarelix , mannitol, other medications, foods, dyes, or preservatives If you or your partner  are pregnant or trying to get pregnant How should I use this medication? This medication is injected under the skin. It is usually given by your care team in a hospital or clinic setting. It may also be given at home. If you get this medication at home, you will be taught how to prepare and give it. Use exactly as directed. Take it as directed on the prescription label at the same time every day. Keep taking it unless your care team tells you to stop. It is important that you put your used needles and syringes in a special sharps container. Do not put them in a trash can. If you do not have a sharps container, call your pharmacist or care team to get one. Talk to your care team about the use of this medication in children. Special care may be needed. Overdosage: If you think you have taken too much of this medicine contact a poison control center or emergency room at once. NOTE: This medicine is only for you. Do not share this medicine with others. What if I miss a dose? If you get this medication at the hospital or clinic: It is important not to miss your dose. Call your care team if you are unable to keep an appointment. If you give yourself this medication at home: If you miss a dose, take it as soon  as you can. If it is almost time for your next dose, take only that dose. Do not take double or extra doses. Call your care team with questions. What may interact with this medication? Do not take this medication with any of the following: Cisapride Dronedarone Pimozide Thioridazine This medication may also interact with the following: Other medications that cause heart rhythm changes This list may not describe all possible interactions. Give your health care provider a list of all the medicines, herbs, non-prescription drugs, or dietary supplements you use. Also tell them if you smoke, drink alcohol, or use illegal drugs. Some items may interact with your medicine. What should I watch for while  using this medication? Your condition will be monitored carefully while you are receiving this medication. You may need blood work while taking this medication. Do not rub or scratch injection site. There may be a lump at the injection site, or it may be red or sore for a few days after your dose. This medication may cause infertility. Talk to your care team if you are concerned about your fertility. What side effects may I notice from receiving this medication? Side effects that you should report to your care team as soon as possible: Allergic reactions or angioedema--skin rash, itching or hives, swelling of the face, eyes, lips, tongue, arms, or legs, trouble swallowing or breathing Heart rhythm changes--fast or irregular heartbeat, dizziness, feeling faint or lightheaded, chest pain, trouble breathing Side effects that usually do not require medical attention (report to your care team if they continue or are bothersome): Hot flashes Pain, redness, or irritation at injection site Weight gain This list may not describe all possible side effects. Call your doctor for medical advice about side effects. You may report side effects to FDA at 1-800-FDA-1088. Where should I keep my medication? Keep out of the reach of children and pets. This medication is usually given in a hospital or clinic and will not be stored at home. In rare cases, this medication may be given at home. If you are using this medication at home, you will be instructed on how to store this medication. Get rid of any unused medication after the expiration date. To get rid of medications that are no longer needed or have expired: Take the medication to a medication take-back program. Check with your pharmacy or law enforcement to find a location. If you cannot return the medication, ask your pharmacist or care team how to get rid of this medication safely. NOTE: This sheet is a summary. It may not cover all possible information. If  you have questions about this medicine, talk to your doctor, pharmacist, or health care provider.  2024 Elsevier/Gold Standard (2021-06-02 00:00:00)

## 2023-10-18 NOTE — Progress Notes (Signed)
 Clinical Pharmacist Practitioner Clinic [facility]  Telephone:([phone] [phone]  Patient Care Team: Center, [facility] as PCP - General Jinny Carmine, MD as Consulting Physician (Gastroenterology) Jacobo Evalene PARAS, MD as Consulting Physician (Oncology)   Name of the patient: James Keller  [id number]  04-18-1950   Date of visit: 10/18/23  HPI: Patient is a 73 y.o. male with metastatic castartion sensitive prostate cancer. Patient started treatment with abiraterone /prednisone  on 09/20/23.   Reason for Consult: Oral chemotherapy follow-up for abiraterone  therapy.   PAST MEDICAL HISTORY: Past Medical History:  Diagnosis Date   Colon cancer (HCC)    a. 11/2013 s/p colon resection/Hartmann's procedure;  b. Chemo: FOLFOX completed 07/2014.   DVT (deep venous thrombosis) (HCC)    RIGHT LEG/ HX OF   DVT (deep venous thrombosis) (HCC)    Dysrhythmia    Hypertension    Lower extremity edema    Rt leg   Neuromuscular disorder (HCC)    TINGLING IN FINGERS and feet   PAF (paroxysmal atrial fibrillation) (HCC)    S/P chemotherapy, time since less than 4 weeks    Tobacco abuse     HEMATOLOGY/ONCOLOGY HISTORY:  Oncology History  Prostate cancer metastatic to bone (HCC)  08/30/2023 Initial Diagnosis   Prostate cancer metastatic to bone (HCC)   08/30/2023 Cancer Staging   Staging form: Prostate, AJCC 8th Edition - Clinical stage from 08/30/2023: Stage IVB (cTX, cN1, cM1b, PSA: 930) - Signed by Jacobo Evalene PARAS, MD on 08/30/2023 Stage prefix: Initial diagnosis Prostate specific antigen (PSA) range: 20 or greater    Genetic Testing   Negative genetic testing. No pathogenic variants identified on the [facility] Cancer[facility]+RNA panel. The report date is 09/22/2023.  The [facility] CancerNext+RNAinsight Panel includes sequencing, rearrangement analysis, and RNA analysis for the following 40 genes: APC, ATM, BAP1, BARD1, BMPR1A, BRCA1, BRCA2, BRIP1,  CDH1, CDKN2A, CHEK2, FH, FLCN, MET, MLH1, MSH2, MSH6, MUTYH, NF1, NTHL1, PALB2, PMS2, PTEN, RAD51C, RAD51D, RPS20, SMAD4, STK11, TP53, TSC1, TSC2, and VHL (sequencing and deletion/duplication); AXIN2, HOXB13, MBD4, MSH3, POLD1 and POLE (sequencing only); EPCAM and GREM1 (deletion/duplication only).     ALLERGIES:  has no known allergies.  MEDICATIONS:  Current Outpatient Medications  Medication Sig Dispense Refill   abiraterone  acetate (ZYTIGA ) 250 MG tablet Take 4 tablets (1,000 mg total) by mouth daily. Take on an empty stomach 1 hour before or 2 hours after a meal (Patient not taking: Reported on 10/18/2023) 120 tablet 2   acetaminophen  (TYLENOL ) 500 MG tablet Take 500 mg by mouth every 6 (six) hours as needed.     amiodarone  (PACERONE ) 200 MG tablet Take 200 mg by mouth daily.      amLODipine (NORVASC) 10 MG tablet Take 1 tablet by mouth daily.     amlodipine-atorvastatin (CADUET) 10-20 MG tablet Take 1 tablet by mouth daily.     atorvastatin (LIPITOR) 40 MG tablet Take 40 mg by mouth daily.     gabapentin  (NEURONTIN ) 300 MG capsule Take 1 capsule (300 mg total) by mouth 3 (three) times daily. (Patient taking differently: Take 300 mg by mouth 3 (three) times daily. AS NEEDED) 90 capsule 1   lisinopril (PRINIVIL,ZESTRIL) 20 MG tablet Take 1 tablet by mouth daily. Reported on 08/12/2015 (Patient not taking: Reported on 10/18/2023)     lisinopril (ZESTRIL) 40 MG tablet Take 40 mg by mouth daily.     metoprolol  tartrate (LOPRESSOR ) 25 MG tablet Take 25 mg by mouth 2 (two) times daily. Am and pm  Oxycodone  HCl 10 MG TABS Take 1 tablet (10 mg total) by mouth every 4 (four) hours as needed. 60 tablet 0   phenazopyridine  (PYRIDIUM ) 95 MG tablet Take 1 tablet (95 mg total) by mouth 3 (three) times daily as needed for pain. 10 tablet 0   predniSONE  (DELTASONE ) 5 MG tablet Take 1 tablet (5 mg total) by mouth daily with breakfast. (Patient not taking: Reported on 10/18/2023) 30 tablet 2   predniSONE   (STERAPRED UNI-PAK 21 TAB) 10 MG (21) TBPK tablet Take 6 tablets (60 mg) x 1 day, then take 5 tablets (50 mg) x 1 day, then take 4 tablets (40 mg) x 1 day, then take 3 tablets (30 mg) x 1 day, then take 2 tablets (20 mg) x 1 day, then take 1 tablet (10 mg) x 1 day, then resume regular 5 mg/day dosing 21 each 0   rivaroxaban (XARELTO) 20 MG TABS tablet Take 20 mg by mouth daily with supper.     No current facility-administered medications for this visit.   Facility-Administered Medications Ordered in Other Visits  Medication Dose Route Frequency Provider Last Rate Last Admin   0.9 %  sodium chloride  infusion   Intravenous Continuous Jacobo, Evalene PARAS, MD       heparin  lock flush 100 unit/mL  500 Units Intracatheter PRN Jacobo Evalene PARAS, MD       sodium chloride  0.9 % injection 10 mL  10 mL Intracatheter PRN Finnegan, Timothy J, MD   10 mL at 10/19/14 1353   sodium chloride  flush (NS) 0.9 % injection 10 mL  10 mL Intracatheter PRN Jacobo Evalene PARAS, MD       sodium chloride  flush (NS) 0.9 % injection 10 mL  10 mL Intravenous PRN Finnegan, Timothy J, MD   10 mL at 08/13/17 1442   zoledronic  acid (ZOMETA ) 3.5 mg in sodium chloride  0.9 % 100 mL IVPB  3.5 mg Intravenous Once Finnegan, Timothy J, MD        VITAL SIGNS: There were no vitals taken for this visit. There were no vitals filed for this visit.  Estimated body mass index is 25.86 kg/m as calculated from the following:   Height as of an earlier encounter on 10/18/23: 6' 1 (1.854 m).   Weight as of an earlier encounter on 10/18/23: 88.9 kg (196 lb).  LABS: CBC:    Component Value Date/Time   WBC 4.0 10/18/2023 0942   HGB 8.4 (L) 10/18/2023 0942   HGB 13.4 05/12/2014 0856   HCT 26.7 (L) 10/18/2023 0942   HCT 41.0 05/12/2014 0856   PLT 133 (L) 10/18/2023 0942   PLT 148 (L) 05/12/2014 0856   MCV 86.1 10/18/2023 0942   MCV 86 05/12/2014 0856   NEUTROABS 2.0 10/18/2023 0942   NEUTROABS 4.2 05/12/2014 0856   LYMPHSABS 1.6  10/18/2023 0942   LYMPHSABS 1.8 05/12/2014 0856   MONOABS 0.3 10/18/2023 0942   MONOABS 0.5 05/12/2014 0856   EOSABS 0.0 10/18/2023 0942   EOSABS 0.1 05/12/2014 0856   BASOSABS 0.0 10/18/2023 0942   BASOSABS 0.1 05/12/2014 0856   Comprehensive Metabolic Panel:    Component Value Date/Time   NA 136 10/18/2023 0941   NA 139 05/12/2014 0856   K 4.0 10/18/2023 0941   K 3.3 (L) 05/12/2014 0856   CL 110 10/18/2023 0941   CL 105 05/12/2014 0856   CO2 21 (L) 10/18/2023 0941   CO2 26 05/12/2014 0856   BUN 26 (H) 10/18/2023 0941   BUN  18 05/12/2014 0856   CREATININE 1.57 (H) 10/18/2023 0941   CREATININE 1.58 (H) 05/12/2014 0856   GLUCOSE 92 10/18/2023 0941   GLUCOSE 143 (H) 05/12/2014 0856   CALCIUM  8.2 (L) 10/18/2023 0941   CALCIUM  9.2 05/12/2014 0856   AST 17 10/18/2023 0941   ALT 14 10/18/2023 0941   ALT 24 05/12/2014 0856   ALKPHOS 607 (H) 10/18/2023 0941   ALKPHOS 94 05/12/2014 0856   BILITOT 0.8 10/18/2023 0941   PROT 6.1 (L) 10/18/2023 0941   PROT 7.2 05/12/2014 0856   ALBUMIN 3.4 (L) 10/18/2023 0941   ALBUMIN 4.1 05/12/2014 0856     Present during today's visit: patient and his girlfriend   Assessment and Plan: CBC/CMP reviewed, continue abiraterone  1000mg  daily Hgb has dropped to 8.4, patient reported black tarry stool, MD sending patient for additional add-on labs today Of note, patient reported dizziness when he got up last night to go to the restroom, that was the first occurrence  Patient on prednisone  taper for recent rash (taper started yesterday). He knows once the taper is done he will resume regular prednisone  5mg  daily dosing to go along with abiraterone  Patient to receive Zometa  and ADT inj today   Oral Chemotherapy Side Effect/Intolerance:  No reported edema or fatigue  Oral Chemotherapy Adherence: No missed doses reported No patient barriers to medication adherence identified.   New medications: None reported  Medication Access Issues: No issue,  [facility] fills at [facility]  Patient expressed understanding and was in agreement with this plan. He also understands that He can call clinic at any time with any questions, concerns, or complaints.   Follow-up plan: RTC as scheduled  Thank you for allowing me to participate in the care of this very pleasant patient.   Time Total: 15 mins  Visit consisted of counseling and education on dealing with issues of symptom management in the setting of serious and potentially life-threatening illness.Greater than 50%  of this time was spent counseling and coordinating care related to the above assessment and plan.  Signed by: Indie Boehne N. Cionna Collantes, PharmD, NEILA, CPP Hematology/Oncology Clinical Pharmacist Practitioner [place] [phone]  10/18/2023 11:31 AM

## 2023-10-18 NOTE — Progress Notes (Signed)
 [facility]  [phone] Fax:(336) [phone]  ID: NAVEEN CLARDY OB: [birth year]  MR#: [id number]  RDW#:[id number]  Patient Care Team: Center, [facility] as PCP - General Jinny Carmine, MD as Consulting Physician (Gastroenterology) Jacobo Evalene JINNY, MD as Consulting Physician (Oncology)  CHIEF COMPLAINT: Stage IVb adenocarcinoma the prostate.  Unknown Gleason score.  INTERVAL HISTORY: Patient returns to clinic today for further evaluation and continuation of Firmagon , Zometa , and Zytiga .  He is tolerating his treatment well without significant side effects.  He currently feels well and is asymptomatic.  He does not complain of any weakness or fatigue today.  He denies any recent fevers or illnesses.  He has a good appetite and denies weight loss.  He denies any chest pain, shortness of breath, cough, or hemoptysis.  He denies any nausea, vomiting, constipation, or diarrhea.  He denies any melena or hematochezia.  He has no urinary complaints.  Patient offers no specific complaints today.  REVIEW OF SYSTEMS:   Review of Systems  Constitutional: Negative.  Negative for fever, malaise/fatigue and weight loss.  Respiratory: Negative.  Negative for cough, hemoptysis and shortness of breath.   Cardiovascular: Negative.  Negative for chest pain and leg swelling.  Gastrointestinal: Negative.  Negative for abdominal pain, blood in stool, constipation and melena.  Genitourinary: Negative.  Negative for frequency, hematuria and urgency.  Musculoskeletal: Negative.  Negative for back pain.  Skin: Negative.  Negative for rash.  Neurological: Negative.  Negative for dizziness, sensory change, focal weakness, weakness and headaches.  Psychiatric/Behavioral: Negative.  The patient is not nervous/anxious.     As per HPI. Otherwise, a complete review of systems is negative.  PAST MEDICAL HISTORY: Past Medical History:  Diagnosis Date   Colon cancer  (HCC)    a. 11/2013 s/p colon resection/Hartmann's procedure;  b. Chemo: FOLFOX completed 07/2014.   DVT (deep venous thrombosis) (HCC)    RIGHT LEG/ HX OF   DVT (deep venous thrombosis) (HCC)    Dysrhythmia    Hypertension    Lower extremity edema    Rt leg   Neuromuscular disorder (HCC)    TINGLING IN FINGERS and feet   PAF (paroxysmal atrial fibrillation) (HCC)    S/P chemotherapy, time since less than 4 weeks    Tobacco abuse     PAST SURGICAL HISTORY: Past Surgical History:  Procedure Laterality Date   COLON RESECTION  12/19/2013   colostomy   COLON SURGERY     COLONOSCOPY WITH PROPOFOL  N/A 09/14/2014   Procedure: COLONOSCOPY WITH PROPOFOL  THROUGH COLOSTOMY AND COLONOSCOPY THRU RECTUM;  Surgeon: Carmine Jinny, MD;  Location: [facility];  Service: Endoscopy;  Laterality: N/A;  TRANSVERSE COLON POLYP AND SIGMOID COLON POLYP   COLONOSCOPY WITH PROPOFOL  N/A 11/29/2015   Procedure: COLONOSCOPY WITH PROPOFOL ;  Surgeon: Ruel Kung, MD;  Location: [facility] END[facility]COPY;  Service: Endoscopy;  Laterality: N/A;   COLONOSCOPY WITH PROPOFOL  N/A 01/21/2019   Procedure: COLONOSCOPY WITH PROPOFOL ;  Surgeon: Jinny Carmine, MD;  Location: [facility] END[facility]COPY;  Service: Endoscopy;  Laterality: N/A;   COLOSTOMY REVERSAL N/A 10/28/2014   Procedure: COLOSTOMY REVERSAL;  Surgeon: Lonni Brands, MD;  Location: [facility] ORS;  Service: General;  Laterality: N/A;   ESOPHAGOGASTRODUODENOSCOPY (EGD) WITH PROPOFOL  N/A 01/21/2019   Procedure: ESOPHAGOGASTRODUODENOSCOPY (EGD) WITH PROPOFOL ;  Surgeon: Jinny Carmine, MD;  Location: [facility] END[facility]COPY;  Service: Endoscopy;  Laterality: N/A;   FEMUR FRACTURE SURGERY Right    FRACTURE SURGERY     LEG SURGERY  LYSIS OF ADHESION  10/28/2014   Procedure: LYSIS OF ADHESION;  Surgeon: Lonni Brands, MD;  Location: [facility] ORS;  Service: General;;   PORT A CATH INJECTION ([facility] HX) Right    TUNNELED VENOUS PORT PLACEMENT      FAMILY HISTORY: Grandmother with liver  cancer.     ADVANCED DIRECTIVES:    HEALTH MAINTENANCE: Social History   Tobacco Use   Smoking status: Every Day    Current packs/day: 0.50    Average packs/day: 0.5 packs/day for 54.0 years (27.0 ttl pk-yrs)    Types: Cigarettes   Smokeless tobacco: Never  Vaping Use   Vaping status: Never Used  Substance Use Topics   Alcohol use: Yes    Alcohol/week: 6.0 standard drinks of alcohol    Types: 6 Cans of beer per week    Comment: moderate beer   Drug use: Yes    Types: Marijuana    Comment: occasional     Colonoscopy:  PAP:  Bone density:  Lipid panel:  No Known Allergies   Current Outpatient Medications  Medication Sig Dispense Refill   acetaminophen  (TYLENOL ) 500 MG tablet Take 500 mg by mouth every 6 (six) hours as needed.     amiodarone  (PACERONE ) 200 MG tablet Take 200 mg by mouth daily.      amLODipine (NORVASC) 10 MG tablet Take 1 tablet by mouth daily.     amlodipine-atorvastatin (CADUET) 10-20 MG tablet Take 1 tablet by mouth daily.     atorvastatin (LIPITOR) 40 MG tablet Take 40 mg by mouth daily.     gabapentin  (NEURONTIN ) 300 MG capsule Take 1 capsule (300 mg total) by mouth 3 (three) times daily. (Patient taking differently: Take 300 mg by mouth 3 (three) times daily. AS NEEDED) 90 capsule 1   lisinopril (ZESTRIL) 40 MG tablet Take 40 mg by mouth daily.     metoprolol  tartrate (LOPRESSOR ) 25 MG tablet Take 25 mg by mouth 2 (two) times daily. Am and pm     Oxycodone  HCl 10 MG TABS Take 1 tablet (10 mg total) by mouth every 4 (four) hours as needed. 60 tablet 0   phenazopyridine  (PYRIDIUM ) 95 MG tablet Take 1 tablet (95 mg total) by mouth 3 (three) times daily as needed for pain. 10 tablet 0   predniSONE  (STERAPRED UNI-PAK 21 TAB) 10 MG (21) TBPK tablet Take 6 tablets (60 mg) x 1 day, then take 5 tablets (50 mg) x 1 day, then take 4 tablets (40 mg) x 1 day, then take 3 tablets (30 mg) x 1 day, then take 2 tablets (20 mg) x 1 day, then take 1 tablet (10 mg) x 1  day, then resume regular 5 mg/day dosing 21 each 0   rivaroxaban (XARELTO) 20 MG TABS tablet Take 20 mg by mouth daily with supper.     abiraterone  acetate (ZYTIGA ) 250 MG tablet Take 4 tablets (1,000 mg total) by mouth daily. Take on an empty stomach 1 hour before or 2 hours after a meal 120 tablet 2   lisinopril (PRINIVIL,ZESTRIL) 20 MG tablet Take 1 tablet by mouth daily. Reported on 08/12/2015 (Patient not taking: Reported on 10/18/2023)     predniSONE  (DELTASONE ) 5 MG tablet Take 1 tablet (5 mg total) by mouth daily with breakfast. (Patient not taking: Reported on 10/18/2023) 30 tablet 2   No current facility-administered medications for this visit.   Facility-Administered Medications Ordered in Other Visits  Medication Dose Route Frequency Provider Last Rate Last Admin   0.9 %  sodium chloride  infusion   Intravenous Continuous Juanjose Mojica J, MD   Stopped at 10/18/23 1139   heparin  lock flush 100 unit/mL  500 Units Intracatheter PRN Jacobo Evalene PARAS, MD       sodium chloride  0.9 % injection 10 mL  10 mL Intracatheter PRN Naresh Althaus J, MD   10 mL at 10/19/14 1353   sodium chloride  flush (NS) 0.9 % injection 10 mL  10 mL Intracatheter PRN Jacobo Evalene PARAS, MD       sodium chloride  flush (NS) 0.9 % injection 10 mL  10 mL Intravenous PRN Carleah Yablonski J, MD   10 mL at 08/13/17 1442    OBJECTIVE: Vitals:   10/18/23 1020  BP: 122/70  Pulse: (!) 59  Resp: 18  Temp: 97.7 F (36.5 C)  SpO2: 100%       Body mass index is 25.86 kg/m.    ECOG FS:0 - Asymptomatic  General: Well-developed, well-nourished, no acute distress. Eyes: Pink conjunctiva, anicteric sclera. HEENT: Normocephalic, moist mucous membranes. Lungs: No audible wheezing or coughing. Heart: Regular rate and rhythm. Abdomen: Soft, nontender, no obvious distention. Musculoskeletal: No edema, cyanosis, or clubbing. Neuro: Alert, answering all questions appropriately. Cranial nerves grossly intact. Skin:  No rashes or petechiae noted. Psych: Normal affect.   LAB RESULTS:  Lab Results  Component Value Date   NA 136 10/18/2023   K 4.0 10/18/2023   CL 110 10/18/2023   CO2 21 (L) 10/18/2023   GLUCOSE 92 10/18/2023   BUN 26 (H) 10/18/2023   CREATININE 1.57 (H) 10/18/2023   CALCIUM  8.2 (L) 10/18/2023   PROT 6.1 (L) 10/18/2023   ALBUMIN 3.4 (L) 10/18/2023   AST 17 10/18/2023   ALT 14 10/18/2023   ALKPHOS 607 (H) 10/18/2023   BILITOT 0.8 10/18/2023   GFRNONAA 47 (L) 10/18/2023   GFRAA 45 (L) 05/29/2019    Lab Results  Component Value Date   WBC 4.0 10/18/2023   NEUTROABS 2.0 10/18/2023   HGB 8.4 (L) 10/18/2023   HCT 26.7 (L) 10/18/2023   MCV 86.1 10/18/2023   PLT 133 (L) 10/18/2023    STUDIES:    No results found.   ASSESSMENT: Stage IVb adenocarcinoma the prostate.  Unknown Gleason score.  PLAN:    Stage IVb adenocarcinoma the prostate.  Unknown Gleason score: Patient underwent biopsy of one of his bone lesions on September 17, 2023 confirming diagnosis.  His PSA is now greater than 1500.  Today's result is pending.  PSMA PET confirmed metastatic disease with positive the prostate gland, pelvic lymphadenopathy, and widespread bony metastasis.  Proceed with Firmagon , Zometa , and Zytiga  today.  Return to clinic in 4 weeks for further evaluation and continuation of treatment.  Appreciate clinical pharmacy input.   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  Previously, patient CEA was chronically elevated ranging from 4.6-8.1 since at least October 2018.  His most recent CEA is 6.3.  Today's result is pending.  He had a colonoscopy and upper endoscopy on January 21, 2019 that did not reveal any significant pathology or evidence of recurrent disease.  No further interventions are needed.   H/o DVT: Patient has discontinued Coumadin  and is now on Xarelto.  Managed by patient's primary  care. Hyponatremia: Resolved. Chronic renal insufficiency: GFR mildly improved to 47.   Anemia: Patient's hemoglobin has dropped to 8.4.  He reported to nursing that he is having dark tarry  stools.  Iron panel, folate, and LDH are within normal limits.  The remainder of his laboratory work from today is pending at time of dictation.  Consider referral back to GI for colonoscopy. Thrombocytopenia: Mild, monitor.  Patient platelet count is 133. Neuropathy: Patient does not complain of this today.     Patient expressed understanding and was in agreement with this plan. He also understands that He can call clinic at any time with any questions, concerns, or complaints.   Colon cancer   Staging form: Colon/Rectum, AJCC 7th Edition     Pathologic stage from 05/04/2014: Stage IIIB (T4, N1, M0) - Signed by Evalene JINNY Reusing, MD on 05/04/2014   Evalene JINNY Reusing, MD   10/18/2023 2:20 PM

## 2023-10-19 LAB — HAPTOGLOBIN: Haptoglobin: 193 mg/dL (ref 34–355)

## 2023-10-19 LAB — CEA: CEA: 8.6 ng/mL — ABNORMAL HIGH (ref 0.0–4.7)

## 2023-11-02 ENCOUNTER — Other Ambulatory Visit: Payer: Self-pay

## 2023-11-05 ENCOUNTER — Other Ambulatory Visit: Payer: Self-pay

## 2023-11-05 ENCOUNTER — Other Ambulatory Visit (HOSPITAL_COMMUNITY): Payer: Self-pay

## 2023-11-05 NOTE — Progress Notes (Signed)
 Specialty Pharmacy Refill Coordination Note  Spoke with James Keller is a 73 y.o. male contacted today regarding refills of specialty medication(s) Abiraterone  Acetate (ZYTIGA )  Doses on hand: 11 (44 tablets)  Patient requested: Delivery   Delivery date: 11/09/23   Verified address: [address] [place] [address]  Medication will be filled on 11/08/23.

## 2023-11-07 ENCOUNTER — Other Ambulatory Visit: Payer: Self-pay

## 2023-11-14 NOTE — Progress Notes (Unsigned)
 Clinical Pharmacist Practitioner Clinic [facility]  [phone] [phone]  Patient Care Team: Center, [facility] as PCP - General Jinny Carmine, MD as Consulting Physician (Gastroenterology) Jacobo Evalene PARAS, MD as Consulting Physician (Oncology)   Name of the patient: James Keller  [id number]  1950/05/12   Date of visit: 11/15/23  HPI: Patient is a 73 y.o. male with metastatic castartion sensitive prostate cancer. Patient started treatment with abiraterone /prednisone  on 09/20/23.   Reason for Consult: Oral chemotherapy follow-up for abiraterone  therapy.   PAST MEDICAL HISTORY: Past Medical History:  Diagnosis Date   Colon cancer (HCC)    a. 11/2013 s/p colon resection/Hartmann's procedure;  b. Chemo: FOLFOX completed 07/2014.   DVT (deep venous thrombosis) (HCC)    RIGHT LEG/ HX OF   DVT (deep venous thrombosis) (HCC)    Dysrhythmia    Hypertension    Lower extremity edema    Rt leg   Neuromuscular disorder (HCC)    TINGLING IN FINGERS and feet   PAF (paroxysmal atrial fibrillation) (HCC)    S/P chemotherapy, time since less than 4 weeks    Tobacco abuse     HEMATOLOGY/ONCOLOGY HISTORY:  Oncology History  Prostate cancer metastatic to bone (HCC)  08/30/2023 Initial Diagnosis   Prostate cancer metastatic to bone (HCC)   08/30/2023 Cancer Staging   Staging form: Prostate, AJCC 8th Edition - Clinical stage from 08/30/2023: Stage IVB (cTX, cN1, cM1b, PSA: 930) - Signed by Jacobo Evalene PARAS, MD on 08/30/2023 Stage prefix: Initial diagnosis Prostate specific antigen (PSA) range: 20 or greater    Genetic Testing   Negative genetic testing. No pathogenic variants identified on the [facility] Cancer[employer]+RNA panel. The report date is 09/22/2023.  The [employer] CancerNext+RNAinsight Panel includes sequencing, rearrangement analysis, and RNA analysis for the following 40 genes: APC, ATM, BAP1, BARD1, BMPR1A, BRCA1, BRCA2, BRIP1,  CDH1, CDKN2A, CHEK2, FH, FLCN, MET, MLH1, MSH2, MSH6, MUTYH, NF1, NTHL1, PALB2, PMS2, PTEN, RAD51C, RAD51D, RPS20, SMAD4, STK11, TP53, TSC1, TSC2, and VHL (sequencing and deletion/duplication); AXIN2, HOXB13, MBD4, MSH3, POLD1 and POLE (sequencing only); EPCAM and GREM1 (deletion/duplication only).     ALLERGIES:  has no known allergies.  MEDICATIONS:  Current Outpatient Medications  Medication Sig Dispense Refill   abiraterone  acetate (ZYTIGA ) 250 MG tablet Take 4 tablets (1,000 mg total) by mouth daily. Take on an empty stomach 1 hour before or 2 hours after a meal 120 tablet 2   acetaminophen  (TYLENOL ) 500 MG tablet Take 500 mg by mouth every 6 (six) hours as needed.     amiodarone  (PACERONE ) 200 MG tablet Take 200 mg by mouth daily.      amLODipine (NORVASC) 10 MG tablet Take 1 tablet by mouth daily.     amlodipine-atorvastatin (CADUET) 10-20 MG tablet Take 1 tablet by mouth daily.     atorvastatin (LIPITOR) 40 MG tablet Take 40 mg by mouth daily.     gabapentin  (NEURONTIN ) 300 MG capsule Take 1 capsule (300 mg total) by mouth 3 (three) times daily. (Patient taking differently: Take 300 mg by mouth 3 (three) times daily. AS NEEDED) 90 capsule 1   lisinopril (PRINIVIL,ZESTRIL) 20 MG tablet Take 1 tablet by mouth daily. Reported on 08/12/2015     lisinopril (ZESTRIL) 40 MG tablet Take 40 mg by mouth daily.     metoprolol  tartrate (LOPRESSOR ) 25 MG tablet Take 25 mg by mouth 2 (two) times daily. Am and pm     Oxycodone  HCl 10 MG TABS Take 1 tablet (  10 mg total) by mouth every 4 (four) hours as needed. 60 tablet 0   phenazopyridine  (PYRIDIUM ) 95 MG tablet Take 1 tablet (95 mg total) by mouth 3 (three) times daily as needed for pain. 10 tablet 0   predniSONE  (DELTASONE ) 5 MG tablet Take 1 tablet (5 mg total) by mouth daily with breakfast. (Patient not taking: Reported on 11/15/2023) 30 tablet 2   predniSONE  (STERAPRED UNI-PAK 21 TAB) 10 MG (21) TBPK tablet Take 6 tablets (60 mg) x 1 day, then  take 5 tablets (50 mg) x 1 day, then take 4 tablets (40 mg) x 1 day, then take 3 tablets (30 mg) x 1 day, then take 2 tablets (20 mg) x 1 day, then take 1 tablet (10 mg) x 1 day, then resume regular 5 mg/day dosing 21 each 0   rivaroxaban (XARELTO) 20 MG TABS tablet Take 20 mg by mouth daily with supper.     No current facility-administered medications for this visit.   Facility-Administered Medications Ordered in Other Visits  Medication Dose Route Frequency Provider Last Rate Last Admin   0.9 %  sodium chloride  infusion   Intravenous Continuous Finnegan, Timothy J, MD   Stopped at 11/15/23 1159   heparin  lock flush 100 unit/mL  500 Units Intracatheter PRN Jacobo Evalene PARAS, MD       sodium chloride  0.9 % injection 10 mL  10 mL Intracatheter PRN Finnegan, Timothy J, MD   10 mL at 10/19/14 1353   sodium chloride  flush (NS) 0.9 % injection 10 mL  10 mL Intracatheter PRN Jacobo Evalene PARAS, MD       sodium chloride  flush (NS) 0.9 % injection 10 mL  10 mL Intravenous PRN Finnegan, Timothy J, MD   10 mL at 08/13/17 1442    VITAL SIGNS: There were no vitals taken for this visit. There were no vitals filed for this visit.  Estimated body mass index is 25.75 kg/m as calculated from the following:   Height as of 10/18/23: 6' 1 (1.854 m).   Weight as of an earlier encounter on 11/15/23: 88.5 kg (195 lb 3.2 oz).  LABS: CBC:    Component Value Date/Time   WBC 3.5 (L) 11/15/2023 0931   HGB 9.3 (L) 11/15/2023 0931   HGB 13.4 05/12/2014 0856   HCT 29.7 (L) 11/15/2023 0931   HCT 41.0 05/12/2014 0856   PLT 166 11/15/2023 0931   PLT 148 (L) 05/12/2014 0856   MCV 89.5 11/15/2023 0931   MCV 86 05/12/2014 0856   NEUTROABS 2.0 11/15/2023 0931   NEUTROABS 4.2 05/12/2014 0856   LYMPHSABS 1.2 11/15/2023 0931   LYMPHSABS 1.8 05/12/2014 0856   MONOABS 0.3 11/15/2023 0931   MONOABS 0.5 05/12/2014 0856   EOSABS 0.0 11/15/2023 0931   EOSABS 0.1 05/12/2014 0856   BASOSABS 0.0 11/15/2023 0931    BASOSABS 0.1 05/12/2014 0856   Comprehensive Metabolic Panel:    Component Value Date/Time   NA 140 11/15/2023 0930   NA 139 05/12/2014 0856   K 4.6 11/15/2023 0930   K 3.3 (L) 05/12/2014 0856   CL 111 11/15/2023 0930   CL 105 05/12/2014 0856   CO2 23 11/15/2023 0930   CO2 26 05/12/2014 0856   BUN 25 (H) 11/15/2023 0930   BUN 18 05/12/2014 0856   CREATININE 1.33 (H) 11/15/2023 0930   CREATININE 1.58 (H) 05/12/2014 0856   GLUCOSE 98 11/15/2023 0930   GLUCOSE 143 (H) 05/12/2014 0856   CALCIUM  8.5 (L) 11/15/2023 0930  CALCIUM  9.2 05/12/2014 0856   AST 23 11/15/2023 0930   ALT 14 11/15/2023 0930   ALT 24 05/12/2014 0856   ALKPHOS 335 (H) 11/15/2023 0930   ALKPHOS 94 05/12/2014 0856   BILITOT 0.9 11/15/2023 0930   PROT 6.2 (L) 11/15/2023 0930   PROT 7.2 05/12/2014 0856   ALBUMIN 3.6 11/15/2023 0930   ALBUMIN 4.1 05/12/2014 0856     Present during today's visit: patient only  Assessment and Plan: CBC/CMP reviewed, continue abiraterone  1000mg  daily Hgb improved since last office visit, continue to monitor PSA today pending but significantly decreased at las office visit Patient to receive Zometa  and ADT inj today Of note, patient has noticed in increase in his eyes watering over the last few weeks, he reported visual changes. I am not seeing this as a commonly reported side effect of abiraterone .    Oral Chemotherapy Side Effect/Intolerance:  Edema: patient has noticed edema in both of his feet, he has been elevating them when sitting down, recommended the addition of compression stocking during the day  Oral Chemotherapy Adherence: No missed doses reported No patient barriers to medication adherence identified.   New medications: None reported  Medication Access Issues: No issue, patient fills at [facility]  Patient expressed understanding and was in agreement with this plan. He also understands that He can call clinic at any time with any  questions, concerns, or complaints.   Follow-up plan: RTC as scheduled  Thank you for allowing me to participate in the care of this very pleasant patient.   Time Total: 15 mins  Visit consisted of counseling and education on dealing with issues of symptom management in the setting of serious and potentially life-threatening illness.Greater than 50%  of this time was spent counseling and coordinating care related to the above assessment and plan.  Signed by: Dashana Guizar N. Filmore Molyneux, PharmD, NEILA, CPP Hematology/Oncology Clinical Pharmacist Practitioner [place] [phone]  11/15/2023 2:44 PM

## 2023-11-15 ENCOUNTER — Inpatient Hospital Stay: Admitting: Pharmacist

## 2023-11-15 ENCOUNTER — Inpatient Hospital Stay (HOSPITAL_BASED_OUTPATIENT_CLINIC_OR_DEPARTMENT_OTHER): Admitting: Oncology

## 2023-11-15 ENCOUNTER — Encounter: Payer: Self-pay | Admitting: Oncology

## 2023-11-15 ENCOUNTER — Inpatient Hospital Stay: Attending: Oncology

## 2023-11-15 ENCOUNTER — Inpatient Hospital Stay

## 2023-11-15 VITALS — BP 133/77 | HR 60 | Temp 97.6°F | Resp 20 | Wt 195.2 lb

## 2023-11-15 DIAGNOSIS — Z79899 Other long term (current) drug therapy: Secondary | ICD-10-CM | POA: Diagnosis not present

## 2023-11-15 DIAGNOSIS — Z5111 Encounter for antineoplastic chemotherapy: Secondary | ICD-10-CM | POA: Diagnosis present

## 2023-11-15 DIAGNOSIS — C61 Malignant neoplasm of prostate: Secondary | ICD-10-CM | POA: Diagnosis not present

## 2023-11-15 DIAGNOSIS — C7951 Secondary malignant neoplasm of bone: Secondary | ICD-10-CM | POA: Insufficient documentation

## 2023-11-15 LAB — CBC WITH DIFFERENTIAL/PLATELET
Abs Immature Granulocytes: 0.01 K/uL (ref 0.00–0.07)
Basophils Absolute: 0 K/uL (ref 0.0–0.1)
Basophils Relative: 1 %
Eosinophils Absolute: 0 K/uL (ref 0.0–0.5)
Eosinophils Relative: 1 %
HCT: 29.7 % — ABNORMAL LOW (ref 39.0–52.0)
Hemoglobin: 9.3 g/dL — ABNORMAL LOW (ref 13.0–17.0)
Immature Granulocytes: 0 %
Lymphocytes Relative: 33 %
Lymphs Abs: 1.2 K/uL (ref 0.7–4.0)
MCH: 28 pg (ref 26.0–34.0)
MCHC: 31.3 g/dL (ref 30.0–36.0)
MCV: 89.5 fL (ref 80.0–100.0)
Monocytes Absolute: 0.3 K/uL (ref 0.1–1.0)
Monocytes Relative: 9 %
Neutro Abs: 2 K/uL (ref 1.7–7.7)
Neutrophils Relative %: 56 %
Platelets: 166 K/uL (ref 150–400)
RBC: 3.32 MIL/uL — ABNORMAL LOW (ref 4.22–5.81)
RDW: 19.4 % — ABNORMAL HIGH (ref 11.5–15.5)
WBC: 3.5 K/uL — ABNORMAL LOW (ref 4.0–10.5)
nRBC: 0 % (ref 0.0–0.2)

## 2023-11-15 LAB — CMP (CANCER CENTER ONLY)
ALT: 14 U/L (ref 0–44)
AST: 23 U/L (ref 15–41)
Albumin: 3.6 g/dL (ref 3.5–5.0)
Alkaline Phosphatase: 335 U/L — ABNORMAL HIGH (ref 38–126)
Anion gap: 6 (ref 5–15)
BUN: 25 mg/dL — ABNORMAL HIGH (ref 8–23)
CO2: 23 mmol/L (ref 22–32)
Calcium: 8.5 mg/dL — ABNORMAL LOW (ref 8.9–10.3)
Chloride: 111 mmol/L (ref 98–111)
Creatinine: 1.33 mg/dL — ABNORMAL HIGH (ref 0.61–1.24)
GFR, Estimated: 57 mL/min — ABNORMAL LOW (ref >60)
Glucose, Bld: 98 mg/dL (ref 70–99)
Potassium: 4.6 mmol/L (ref 3.5–5.1)
Sodium: 140 mmol/L (ref 135–145)
Total Bilirubin: 0.9 mg/dL (ref 0.0–1.2)
Total Protein: 6.2 g/dL — ABNORMAL LOW (ref 6.5–8.1)

## 2023-11-15 LAB — PSA: Prostatic Specific Antigen: 64.64 ng/mL — ABNORMAL HIGH (ref 0.00–4.00)

## 2023-11-15 MED ORDER — DEGARELIX ACETATE 80 MG ~~LOC~~ SOLR
80.0000 mg | Freq: Once | SUBCUTANEOUS | Status: AC
  Administered 2023-11-15: 80 mg via SUBCUTANEOUS
  Filled 2023-11-15: qty 4

## 2023-11-15 MED ORDER — SODIUM CHLORIDE 0.9 % IV SOLN
INTRAVENOUS | Status: DC
  Filled 2023-11-15: qty 250

## 2023-11-15 MED ORDER — ZOLEDRONIC ACID 4 MG/5ML IV CONC
3.5000 mg | Freq: Once | INTRAVENOUS | Status: AC
  Administered 2023-11-15: 3.5 mg via INTRAVENOUS
  Filled 2023-11-15: qty 4.38

## 2023-11-15 NOTE — Progress Notes (Signed)
 [facility]  Telephone:([phone] [phone]) [phone]  ID: James Keller OB: September 20, 1950  MR#: [id number]  RDW#:[id number]  Patient Care Team: Center, [facility] as PCP - General Jinny Carmine, MD as Consulting Physician (Gastroenterology) Jacobo Evalene JINNY, MD as Consulting Physician (Oncology)  CHIEF COMPLAINT: Stage IVb adenocarcinoma the prostate.  Unknown Gleason score.  INTERVAL HISTORY: Patient returns to clinic today for further evaluation and continuation of Firmagon , Zometa , and Zytiga .  He currently feels well and is asymptomatic.  He is tolerating his treatments without significant side effects.  He does not complain of any weakness or fatigue today.  He denies any recent fevers or illnesses.  He has a good appetite and denies weight loss.  He denies any chest pain, shortness of breath, cough, or hemoptysis.  He denies any nausea, vomiting, constipation, or diarrhea.  He denies any melena or hematochezia.  He has no urinary complaints.  Patient offers no specific complaints today.  REVIEW OF SYSTEMS:   Review of Systems  Constitutional: Negative.  Negative for fever, malaise/fatigue and weight loss.  Respiratory: Negative.  Negative for cough, hemoptysis and shortness of breath.   Cardiovascular: Negative.  Negative for chest pain and leg swelling.  Gastrointestinal: Negative.  Negative for abdominal pain, blood in stool, constipation and melena.  Genitourinary: Negative.  Negative for frequency, hematuria and urgency.  Musculoskeletal: Negative.  Negative for back pain.  Skin: Negative.  Negative for rash.  Neurological: Negative.  Negative for dizziness, sensory change, focal weakness, weakness and headaches.  Psychiatric/Behavioral: Negative.  The patient is not nervous/anxious.     As per HPI. Otherwise, a complete review of systems is negative.  PAST MEDICAL HISTORY: Past Medical History:  Diagnosis Date   Colon cancer (HCC)     a. 11/2013 s/p colon resection/Hartmann's procedure;  b. Chemo: FOLFOX completed 07/2014.   DVT (deep venous thrombosis) (HCC)    RIGHT LEG/ HX OF   DVT (deep venous thrombosis) (HCC)    Dysrhythmia    Hypertension    Lower extremity edema    Rt leg   Neuromuscular disorder (HCC)    TINGLING IN FINGERS and feet   PAF (paroxysmal atrial fibrillation) (HCC)    S/P chemotherapy, time since less than 4 weeks    Tobacco abuse     PAST SURGICAL HISTORY: Past Surgical History:  Procedure Laterality Date   COLON RESECTION  12/19/2013   colostomy   COLON SURGERY     COLONOSCOPY WITH PROPOFOL  N/A 09/14/2014   Procedure: COLONOSCOPY WITH PROPOFOL  THROUGH COLOSTOMY AND COLONOSCOPY THRU RECTUM;  Surgeon: Carmine Jinny, MD;  Location: [facility];  Service: Endoscopy;  Laterality: N/A;  TRANSVERSE COLON POLYP AND SIGMOID COLON POLYP   COLONOSCOPY WITH PROPOFOL  N/A 11/29/2015   Procedure: COLONOSCOPY WITH PROPOFOL ;  Surgeon: Ruel Kung, MD;  Location: [facility];  Service: Endoscopy;  Laterality: N/A;   COLONOSCOPY WITH PROPOFOL  N/A 01/21/2019   Procedure: COLONOSCOPY WITH PROPOFOL ;  Surgeon: Jinny Carmine, MD;  Location: [facility] END[facility];  Service: Endoscopy;  Laterality: N/A;   COLOSTOMY REVERSAL N/A 10/28/2014   Procedure: COLOSTOMY REVERSAL;  Surgeon: Lonni Brands, MD;  Location: [facility] ORS;  Service: General;  Laterality: N/A;   ESOPHAGOGASTRODUODENOSCOPY (EGD) WITH PROPOFOL  N/A 01/21/2019   Procedure: ESOPHAGOGASTRODUODENOSCOPY (EGD) WITH PROPOFOL ;  Surgeon: Jinny Carmine, MD;  Location: [facility];  Service: Endoscopy;  Laterality: N/A;   FEMUR FRACTURE SURGERY Right    FRACTURE SURGERY     LEG SURGERY     LYSIS  OF ADHESION  10/28/2014   Procedure: LYSIS OF ADHESION;  Surgeon: Lonni Brands, MD;  Location: [facility] OR[facility];  Service: General;;   PORT A CATH INJECTION ([facility] HX) Right    TUNNELED VENOUS PORT PLACEMENT      FAMILY HISTORY: Grandmother with liver  cancer.     ADVANCED DIRECTIVES:    HEALTH MAINTENANCE: Social History   Tobacco Use   Smoking status: Every Day    Current packs/day: 0.50    Average packs/day: 0.5 packs/day for 54.0 years (27.0 ttl pk-yrs)    Types: Cigarettes   Smokeless tobacco: Never  Vaping Use   Vaping status: Never Used  Substance Use Topics   Alcohol use: Yes    Alcohol/week: 6.0 standard drinks of alcohol    Types: 6 Cans of beer per week    Comment: moderate beer   Drug use: Yes    Types: Marijuana    Comment: occasional     Colonoscopy:  PAP:  Bone density:  Lipid panel:  No Known Allergies   Current Outpatient Medications  Medication Sig Dispense Refill   abiraterone  acetate (ZYTIGA ) 250 MG tablet Take 4 tablets (1,000 mg total) by mouth daily. Take on an empty stomach 1 hour before or 2 hours after a meal 120 tablet 2   acetaminophen  (TYLENOL ) 500 MG tablet Take 500 mg by mouth every 6 (six) hours as needed.     amiodarone  (PACERONE ) 200 MG tablet Take 200 mg by mouth daily.      amLODipine (NORVASC) 10 MG tablet Take 1 tablet by mouth daily.     amlodipine-atorvastatin (CADUET) 10-20 MG tablet Take 1 tablet by mouth daily.     atorvastatin (LIPITOR) 40 MG tablet Take 40 mg by mouth daily.     gabapentin  (NEURONTIN ) 300 MG capsule Take 1 capsule (300 mg total) by mouth 3 (three) times daily. (Patient taking differently: Take 300 mg by mouth 3 (three) times daily. AS NEEDED) 90 capsule 1   lisinopril (PRINIVIL,ZESTRIL) 20 MG tablet Take 1 tablet by mouth daily. Reported on 08/12/2015     lisinopril (ZESTRIL) 40 MG tablet Take 40 mg by mouth daily.     metoprolol  tartrate (LOPRESSOR ) 25 MG tablet Take 25 mg by mouth 2 (two) times daily. Am and pm     Oxycodone  HCl 10 MG TABS Take 1 tablet (10 mg total) by mouth every 4 (four) hours as needed. 60 tablet 0   phenazopyridine  (PYRIDIUM ) 95 MG tablet Take 1 tablet (95 mg total) by mouth 3 (three) times daily as needed for pain. 10 tablet 0    predniSONE  (STERAPRED UNI-PAK 21 TAB) 10 MG (21) TBPK tablet Take 6 tablets (60 mg) x 1 day, then take 5 tablets (50 mg) x 1 day, then take 4 tablets (40 mg) x 1 day, then take 3 tablets (30 mg) x 1 day, then take 2 tablets (20 mg) x 1 day, then take 1 tablet (10 mg) x 1 day, then resume regular 5 mg/day dosing 21 each 0   rivaroxaban (XARELTO) 20 MG TABS tablet Take 20 mg by mouth daily with supper.     predniSONE  (DELTASONE ) 5 MG tablet Take 1 tablet (5 mg total) by mouth daily with breakfast. (Patient not taking: Reported on 11/15/2023) 30 tablet 2   No current facility-administered medications for this visit.   Facility-Administered Medications Ordered in Other Visits  Medication Dose Route Frequency Provider Last Rate Last Admin   heparin  lock flush 100 unit/mL  500 Units  Intracatheter PRN Jacobo Evalene PARAS, MD       sodium chloride  0.9 % injection 10 mL  10 mL Intracatheter PRN Mysty Kielty J, MD   10 mL at 10/19/14 1353   sodium chloride  flush (NS) 0.9 % injection 10 mL  10 mL Intracatheter PRN Jacobo Evalene PARAS, MD       sodium chloride  flush (NS) 0.9 % injection 10 mL  10 mL Intravenous PRN Lowanda Cashaw J, MD   10 mL at 08/13/17 1442    OBJECTIVE: Vitals:   11/15/23 0944  BP: 133/77  Pulse: 60  Resp: 20  Temp: 97.6 F (36.4 C)  SpO2: 100%       Body mass index is 25.75 kg/m.    ECOG FS:0 - Asymptomatic  General: Well-developed, well-nourished, no acute distress. Eyes: Pink conjunctiva, anicteric sclera. HEENT: Normocephalic, moist mucous membranes. Lungs: No audible wheezing or coughing. Heart: Regular rate and rhythm. Abdomen: Soft, nontender, no obvious distention. Musculoskeletal: No edema, cyanosis, or clubbing. Neuro: Alert, answering all questions appropriately. Cranial nerves grossly intact. Skin: No rashes or petechiae noted. Psych: Normal affect.  LAB RESULTS:  Lab Results  Component Value Date   NA 140 11/15/2023   K 4.6 11/15/2023   CL  111 11/15/2023   CO2 23 11/15/2023   GLUCOSE 98 11/15/2023   BUN 25 (H) 11/15/2023   CREATININE 1.33 (H) 11/15/2023   CALCIUM  8.5 (L) 11/15/2023   PROT 6.2 (L) 11/15/2023   ALBUMIN 3.6 11/15/2023   AST 23 11/15/2023   ALT 14 11/15/2023   ALKPHOS 335 (H) 11/15/2023   BILITOT 0.9 11/15/2023   GFRNONAA 57 (L) 11/15/2023   GFRAA 45 (L) 05/29/2019    Lab Results  Component Value Date   WBC 3.5 (L) 11/15/2023   NEUTROABS 2.0 11/15/2023   HGB 9.3 (L) 11/15/2023   HCT 29.7 (L) 11/15/2023   MCV 89.5 11/15/2023   PLT 166 11/15/2023    STUDIES:    No results found.   ASSESSMENT: Stage IVb adenocarcinoma the prostate.  Unknown Gleason score.  PLAN:    Stage IVb adenocarcinoma the prostate.  Unknown Gleason score: Patient underwent biopsy of one of his bone lesions on September 17, 2023 confirming diagnosis.  His initial PSA was greater than 1500, but now has trended down to 132.82.  Today's result is pending.  PSMA PET confirmed metastatic disease with positive the prostate gland, pelvic lymphadenopathy, and widespread bony metastasis.  Continue Zytiga  daily.  Proceed with Firmagon  and Zometa  today.  Return to clinic in 4 weeks for further evaluation and continuation of treatment.  Appreciate clinical pharmacy input.   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  Previously, patient CEA was chronically elevated ranging from 4.6-8.1 since at least October 2018.  His most recent CEA is 6.3.  Today's result is pending.  He had a colonoscopy and upper endoscopy on January 21, 2019 that did not reveal any significant pathology or evidence of recurrent disease.  No further interventions are needed.   H/o DVT: Patient has discontinued Coumadin  and is now on Xarelto.  Managed by patient's primary care. Chronic renal insufficiency: GFR has improved to 57. Anemia: Hemoglobin improved to 9.3.  Previously he reported dark  tarry stools, but does not complain of this today.  Iron panel is within normal limits.  Consider referral back to GI for colonoscopy. Thrombocytopenia: Resolved. Neuropathy: Patient does not complain of this  today.     Patient expressed understanding and was in agreement with this plan. He also understands that He can call clinic at any time with any questions, concerns, or complaints.   Colon cancer   Staging form: Colon/Rectum, AJCC 7th Edition     Pathologic stage from 05/04/2014: Stage IIIB (T4, N1, M0) - Signed by Evalene JINNY Reusing, MD on 05/04/2014   Evalene JINNY Reusing, MD   11/15/2023 10:38 AM

## 2023-11-30 ENCOUNTER — Other Ambulatory Visit: Payer: Self-pay

## 2023-11-30 ENCOUNTER — Other Ambulatory Visit: Payer: Self-pay | Admitting: Oncology

## 2023-11-30 DIAGNOSIS — C61 Malignant neoplasm of prostate: Secondary | ICD-10-CM

## 2023-11-30 MED ORDER — ABIRATERONE ACETATE 250 MG PO TABS
1000.0000 mg | ORAL_TABLET | Freq: Every day | ORAL | 2 refills | Status: AC
  Filled 2023-11-30: qty 120, 30d supply, fill #0
  Filled 2024-01-14: qty 120, 30d supply, fill #1
  Filled 2024-02-08: qty 120, 30d supply, fill #2

## 2023-11-30 MED ORDER — PREDNISONE 5 MG PO TABS
5.0000 mg | ORAL_TABLET | Freq: Every day | ORAL | 2 refills | Status: AC
  Filled 2023-11-30 (×2): qty 30, 30d supply, fill #0
  Filled 2024-01-14: qty 30, 30d supply, fill #1
  Filled 2024-02-08: qty 30, 30d supply, fill #2

## 2023-12-04 ENCOUNTER — Other Ambulatory Visit: Payer: Self-pay

## 2023-12-04 NOTE — Progress Notes (Signed)
 Specialty Pharmacy Refill Coordination Note  James Keller is a 73 y.o. male contacted today regarding refills of specialty medication(s) Abiraterone  Acetate (ZYTIGA ); predniSONE  (DELTASONE , STERAPRED UNI-PAK 21 TAB)   Patient requested Delivery   Delivery date: 12/06/23   Verified address: [address] [place] [address]   Medication will be filled on: 12/05/23

## 2023-12-13 ENCOUNTER — Encounter: Payer: Self-pay | Admitting: Oncology

## 2023-12-13 ENCOUNTER — Inpatient Hospital Stay (HOSPITAL_BASED_OUTPATIENT_CLINIC_OR_DEPARTMENT_OTHER): Admitting: Oncology

## 2023-12-13 ENCOUNTER — Inpatient Hospital Stay

## 2023-12-13 ENCOUNTER — Inpatient Hospital Stay: Attending: Oncology

## 2023-12-13 ENCOUNTER — Inpatient Hospital Stay: Admitting: Pharmacist

## 2023-12-13 VITALS — BP 136/70 | HR 70 | Temp 97.0°F | Resp 16 | Ht 73.0 in | Wt 197.0 lb

## 2023-12-13 DIAGNOSIS — Z79899 Other long term (current) drug therapy: Secondary | ICD-10-CM | POA: Insufficient documentation

## 2023-12-13 DIAGNOSIS — C61 Malignant neoplasm of prostate: Secondary | ICD-10-CM | POA: Insufficient documentation

## 2023-12-13 DIAGNOSIS — Z5111 Encounter for antineoplastic chemotherapy: Secondary | ICD-10-CM | POA: Diagnosis present

## 2023-12-13 DIAGNOSIS — C7951 Secondary malignant neoplasm of bone: Secondary | ICD-10-CM

## 2023-12-13 LAB — CMP (CANCER CENTER ONLY)
ALT: 15 U/L (ref 0–44)
AST: 21 U/L (ref 15–41)
Albumin: 3.7 g/dL (ref 3.5–5.0)
Alkaline Phosphatase: 203 U/L — ABNORMAL HIGH (ref 38–126)
Anion gap: 8 (ref 5–15)
BUN: 34 mg/dL — ABNORMAL HIGH (ref 8–23)
CO2: 24 mmol/L (ref 22–32)
Calcium: 8.9 mg/dL (ref 8.9–10.3)
Chloride: 107 mmol/L (ref 98–111)
Creatinine: 1.63 mg/dL — ABNORMAL HIGH (ref 0.61–1.24)
GFR, Estimated: 44 mL/min — ABNORMAL LOW (ref >60)
Glucose, Bld: 109 mg/dL — ABNORMAL HIGH (ref 70–99)
Potassium: 4.9 mmol/L (ref 3.5–5.1)
Sodium: 139 mmol/L (ref 135–145)
Total Bilirubin: 0.5 mg/dL (ref 0.0–1.2)
Total Protein: 6.5 g/dL (ref 6.5–8.1)

## 2023-12-13 LAB — CBC WITH DIFFERENTIAL/PLATELET
Abs Immature Granulocytes: 0.02 K/uL (ref 0.00–0.07)
Basophils Absolute: 0 K/uL (ref 0.0–0.1)
Basophils Relative: 0 %
Eosinophils Absolute: 0.1 K/uL (ref 0.0–0.5)
Eosinophils Relative: 2 %
HCT: 31.2 % — ABNORMAL LOW (ref 39.0–52.0)
Hemoglobin: 10 g/dL — ABNORMAL LOW (ref 13.0–17.0)
Immature Granulocytes: 0 %
Lymphocytes Relative: 21 %
Lymphs Abs: 1.1 K/uL (ref 0.7–4.0)
MCH: 28 pg (ref 26.0–34.0)
MCHC: 32.1 g/dL (ref 30.0–36.0)
MCV: 87.4 fL (ref 80.0–100.0)
Monocytes Absolute: 0.4 K/uL (ref 0.1–1.0)
Monocytes Relative: 8 %
Neutro Abs: 3.8 K/uL (ref 1.7–7.7)
Neutrophils Relative %: 69 %
Platelets: 176 K/uL (ref 150–400)
RBC: 3.57 MIL/uL — ABNORMAL LOW (ref 4.22–5.81)
RDW: 16.9 % — ABNORMAL HIGH (ref 11.5–15.5)
WBC: 5.4 K/uL (ref 4.0–10.5)
nRBC: 0 % (ref 0.0–0.2)

## 2023-12-13 LAB — PSA: Prostatic Specific Antigen: 81.61 ng/mL — ABNORMAL HIGH (ref 0.00–4.00)

## 2023-12-13 MED ORDER — ZOLEDRONIC ACID 4 MG/5ML IV CONC
3.5000 mg | Freq: Once | INTRAVENOUS | Status: AC
  Administered 2023-12-13: 3.5 mg via INTRAVENOUS
  Filled 2023-12-13: qty 4.38

## 2023-12-13 MED ORDER — DEGARELIX ACETATE 80 MG ~~LOC~~ SOLR
80.0000 mg | Freq: Once | SUBCUTANEOUS | Status: AC
  Administered 2023-12-13: 80 mg via SUBCUTANEOUS
  Filled 2023-12-13: qty 4

## 2023-12-13 MED ORDER — SODIUM CHLORIDE 0.9 % IV SOLN
INTRAVENOUS | Status: DC
  Filled 2023-12-13: qty 250

## 2023-12-13 NOTE — Progress Notes (Signed)
 [facility]  Telephone:([phone] Fax:(336) [phone]  ID: James Keller OB: 10-19-1950  MR#: [id number]  RDW#:[id number]  Patient Care Team: Center, [facility] as PCP - General Jinny Carmine, MD as Consulting Physician (Gastroenterology) Jacobo Evalene JINNY, MD as Consulting Physician (Oncology)  CHIEF COMPLAINT: Stage IVb adenocarcinoma the prostate.  Unknown Gleason score.  INTERVAL HISTORY: Patient returns to clinic today for further evaluation and continuation of Firmagon , Zometa , and Zytiga .  He continues to feel well and remains asymptomatic.  He is tolerating his treatments without significant side effects.  He has no neurologic complaints.  He does not complain of any weakness or fatigue today.  He denies any recent fevers or illnesses.  He has a good appetite and denies weight loss.  He denies any chest pain, shortness of breath, cough, or hemoptysis.  He denies any nausea, vomiting, constipation, or diarrhea.  He denies any melena or hematochezia.  He has no urinary complaints.  Patient offers no specific complaints today.  REVIEW OF SYSTEMS:   Review of Systems  Constitutional: Negative.  Negative for fever, malaise/fatigue and weight loss.  Respiratory: Negative.  Negative for cough, hemoptysis and shortness of breath.   Cardiovascular: Negative.  Negative for chest pain and leg swelling.  Gastrointestinal: Negative.  Negative for abdominal pain, blood in stool, constipation and melena.  Genitourinary: Negative.  Negative for frequency, hematuria and urgency.  Musculoskeletal: Negative.  Negative for back pain.  Skin: Negative.  Negative for rash.  Neurological: Negative.  Negative for dizziness, sensory change, focal weakness, weakness and headaches.  Psychiatric/Behavioral: Negative.  The patient is not nervous/anxious.     As per HPI. Otherwise, a complete review of systems is negative.  PAST MEDICAL HISTORY: Past Medical  History:  Diagnosis Date   Colon cancer (HCC)    a. 11/2013 s/p colon resection/Hartmann's procedure;  b. Chemo: FOLFOX completed 07/2014.   DVT (deep venous thrombosis) (HCC)    RIGHT LEG/ HX OF   DVT (deep venous thrombosis) (HCC)    Dysrhythmia    Hypertension    Lower extremity edema    Rt leg   Neuromuscular disorder (HCC)    TINGLING IN FINGERS and feet   PAF (paroxysmal atrial fibrillation) (HCC)    S/P chemotherapy, time since less than 4 weeks    Tobacco abuse     PAST SURGICAL HISTORY: Past Surgical History:  Procedure Laterality Date   COLON RESECTION  12/19/2013   colostomy   COLON SURGERY     COLONOSCOPY WITH PROPOFOL  N/A 09/14/2014   Procedure: COLONOSCOPY WITH PROPOFOL  THROUGH COLOSTOMY AND COLONOSCOPY THRU RECTUM;  Surgeon: Carmine Jinny, MD;  Location: [facility];  Service: Endoscopy;  Laterality: N/A;  TRANSVERSE COLON POLYP AND SIGMOID COLON POLYP   COLONOSCOPY WITH PROPOFOL  N/A 11/29/2015   Procedure: COLONOSCOPY WITH PROPOFOL ;  Surgeon: Ruel Kung, MD;  Location: [facility];  Service: Endoscopy;  Laterality: N/A;   COLONOSCOPY WITH PROPOFOL  N/A 01/21/2019   Procedure: COLONOSCOPY WITH PROPOFOL ;  Surgeon: Jinny Carmine, MD;  Location: [facility];  Service: Endoscopy;  Laterality: N/A;   COLOSTOMY REVERSAL N/A 10/28/2014   Procedure: COLOSTOMY REVERSAL;  Surgeon: Lonni Brands, MD;  Location: [facility] ORS;  Service: General;  Laterality: N/A;   ESOPHAGOGASTRODUODENOSCOPY (EGD) WITH PROPOFOL  N/A 01/21/2019   Procedure: ESOPHAGOGASTRODUODENOSCOPY (EGD) WITH PROPOFOL ;  Surgeon: Jinny Carmine, MD;  Location: [facility];  Service: Endoscopy;  Laterality: N/A;   FEMUR FRACTURE SURGERY Right    FRACTURE SURGERY  LEG SURGERY     LYSIS OF ADHESION  10/28/2014   Procedure: LYSIS OF ADHESION;  Surgeon: Lonni Brands, MD;  Location: [facility] OR[facility];  Service: General;;   PORT A CATH INJECTION ([facility] HX) Right    TUNNELED VENOUS PORT PLACEMENT       FAMILY HISTORY: Grandmother with liver cancer.     ADVANCED DIRECTIVES:    HEALTH MAINTENANCE: Social History   Tobacco Use   Smoking status: Every Day    Current packs/day: 0.50    Average packs/day: 0.5 packs/day for 54.0 years (27.0 ttl pk-yrs)    Types: Cigarettes   Smokeless tobacco: Never  Vaping Use   Vaping status: Never Used  Substance Use Topics   Alcohol use: Yes    Alcohol/week: 6.0 standard drinks of alcohol    Types: 6 Cans of beer per week    Comment: moderate beer   Drug use: Yes    Types: Marijuana    Comment: occasional     Colonoscopy:  PAP:  Bone density:  Lipid panel:  No Known Allergies   Current Outpatient Medications  Medication Sig Dispense Refill   abiraterone  acetate (ZYTIGA ) 250 MG tablet Take 4 tablets (1,000 mg total) by mouth daily. Take on an empty stomach 1 hour before or 2 hours after a meal 120 tablet 2   acetaminophen  (TYLENOL ) 500 MG tablet Take 500 mg by mouth every 6 (six) hours as needed.     amiodarone  (PACERONE ) 200 MG tablet Take 200 mg by mouth daily.      amLODipine (NORVASC) 10 MG tablet Take 1 tablet by mouth daily.     amlodipine-atorvastatin (CADUET) 10-20 MG tablet Take 1 tablet by mouth daily.     atorvastatin (LIPITOR) 40 MG tablet Take 40 mg by mouth daily.     carbamide peroxide (DEBROX) 6.5 % OTIC solution Place 5 drops in ear(s).     lisinopril (PRINIVIL,ZESTRIL) 20 MG tablet Take 1 tablet by mouth daily. Reported on 08/12/2015     lisinopril (ZESTRIL) 40 MG tablet Take 40 mg by mouth daily.     metoprolol  tartrate (LOPRESSOR ) 25 MG tablet Take 25 mg by mouth 2 (two) times daily. Am and pm     Oxycodone  HCl 10 MG TABS Take 1 tablet (10 mg total) by mouth every 4 (four) hours as needed. 60 tablet 0   phenazopyridine  (PYRIDIUM ) 95 MG tablet Take 1 tablet (95 mg total) by mouth 3 (three) times daily as needed for pain. 10 tablet 0   predniSONE  (DELTASONE ) 5 MG tablet Take 1 tablet (5 mg total) by mouth daily  with breakfast. 30 tablet 2   predniSONE  (STERAPRED UNI-PAK 21 TAB) 10 MG (21) TBPK tablet Take 6 tablets (60 mg) x 1 day, then take 5 tablets (50 mg) x 1 day, then take 4 tablets (40 mg) x 1 day, then take 3 tablets (30 mg) x 1 day, then take 2 tablets (20 mg) x 1 day, then take 1 tablet (10 mg) x 1 day, then resume regular 5 mg/day dosing 21 each 0   rivaroxaban (XARELTO) 20 MG TABS tablet Take 20 mg by mouth daily with supper.     gabapentin  (NEURONTIN ) 300 MG capsule Take 1 capsule (300 mg total) by mouth 3 (three) times daily. (Patient not taking: Reported on 12/13/2023) 90 capsule 1   No current facility-administered medications for this visit.   Facility-Administered Medications Ordered in Other Visits  Medication Dose Route Frequency Provider Last Rate Last Admin  heparin  lock flush 100 unit/mL  500 Units Intracatheter PRN Jacobo Evalene PARAS, MD       sodium chloride  0.9 % injection 10 mL  10 mL Intracatheter PRN Rickie Gutierres J, MD   10 mL at 10/19/14 1353   sodium chloride  flush (NS) 0.9 % injection 10 mL  10 mL Intracatheter PRN Jacobo Evalene PARAS, MD       sodium chloride  flush (NS) 0.9 % injection 10 mL  10 mL Intravenous PRN Suzy Kugel J, MD   10 mL at 08/13/17 1442    OBJECTIVE: Vitals:   12/13/23 0918  BP: 136/70  Pulse: 70  Resp: 16  Temp: (!) 97 F (36.1 C)  SpO2: 100%       Body mass index is 25.99 kg/m.    ECOG FS:0 - Asymptomatic  General: Well-developed, well-nourished, no acute distress. Eyes: Pink conjunctiva, anicteric sclera. HEENT: Normocephalic, moist mucous membranes. Lungs: No audible wheezing or coughing. Heart: Regular rate and rhythm. Abdomen: Soft, nontender, no obvious distention. Musculoskeletal: No edema, cyanosis, or clubbing. Neuro: Alert, answering all questions appropriately. Cranial nerves grossly intact. Skin: No rashes or petechiae noted. Psych: Normal affect.  LAB RESULTS:  Lab Results  Component Value Date   NA  140 11/15/2023   K 4.6 11/15/2023   CL 111 11/15/2023   CO2 23 11/15/2023   GLUCOSE 98 11/15/2023   BUN 25 (H) 11/15/2023   CREATININE 1.33 (H) 11/15/2023   CALCIUM  8.5 (L) 11/15/2023   PROT 6.2 (L) 11/15/2023   ALBUMIN 3.6 11/15/2023   AST 23 11/15/2023   ALT 14 11/15/2023   ALKPHOS 335 (H) 11/15/2023   BILITOT 0.9 11/15/2023   GFRNONAA 57 (L) 11/15/2023   GFRAA 45 (L) 05/29/2019    Lab Results  Component Value Date   WBC 5.4 12/13/2023   NEUTROABS 3.8 12/13/2023   HGB 10.0 (L) 12/13/2023   HCT 31.2 (L) 12/13/2023   MCV 87.4 12/13/2023   PLT 176 12/13/2023    STUDIES:    No results found.   ASSESSMENT: Stage IVb adenocarcinoma the prostate.  Unknown Gleason score.  PLAN:    Stage IVb adenocarcinoma the prostate, unknown Gleason score: Patient underwent biopsy of one of his bone lesions on September 17, 2023 confirming diagnosis.  His initial PSA was greater than 1500.  His PSA trended down to 64.64, but now has trended up slightly to 81.61. PSMA PET confirmed metastatic disease with positive the prostate gland, pelvic lymphadenopathy, and widespread bony metastasis.  Patient initiated Zytiga  in August 2025.  Continue treatment daily. Proceed with Firmagon  and Zometa  today.  Return to clinic in 4 weeks for further evaluation and continuation of treatment.  Appreciate clinical pharmacy input.   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  Previously, patient CEA was chronically elevated ranging from 4.6-8.6 since at least October 2018.  His most recent result is 8.6.  He had a colonoscopy and upper endoscopy on January 21, 2019 that did not reveal any significant pathology or evidence of recurrent disease.  No further interventions are needed.   H/o DVT: Patient has discontinued Coumadin  and is now on Xarelto.  Managed by patient's primary care. Chronic renal insufficiency: GFR decreased to 44,  monitor. Anemia: Hemoglobin improved to 10.0.  Previously, he reported dark tarry stools, but does not complain of this today.  Iron panel on October 18, 2023 was within normal limits.  Consider referral  back to GI for colonoscopy. Neuropathy: Patient does not complain of this today.     Patient expressed understanding and was in agreement with this plan. He also understands that He can call clinic at any time with any questions, concerns, or complaints.   Colon cancer   Staging form: Colon/Rectum, AJCC 7th Edition     Pathologic stage from 05/04/2014: Stage IIIB (T4, N1, M0) - Signed by Evalene JINNY Reusing, MD on 05/04/2014   Evalene JINNY Reusing, MD   12/13/2023 9:39 AM

## 2023-12-13 NOTE — Progress Notes (Signed)
 Clinical Pharmacist Practitioner Clinic [facility]  Telephone:([phone] [phone]  Patient Care Team: Center, [facility] as PCP - General Jinny Carmine, MD as Consulting Physician (Gastroenterology) Jacobo Evalene PARAS, MD as Consulting Physician (Oncology)   Name of the patient: James Keller  [id number]  03/23/1950   Date of visit: 12/13/23  HPI: Patient is a 73 y.o. male with metastatic castartion sensitive prostate cancer. Patient started treatment with abiraterone /prednisone  on 09/20/23.   Reason for Consult: Oral chemotherapy follow-up for abiraterone  therapy.   PAST MEDICAL HISTORY: Past Medical History:  Diagnosis Date   Colon cancer (HCC)    a. 11/2013 s/p colon resection/Hartmann's procedure;  b. Chemo: FOLFOX completed 07/2014.   DVT (deep venous thrombosis) (HCC)    RIGHT LEG/ HX OF   DVT (deep venous thrombosis) (HCC)    Dysrhythmia    Hypertension    Lower extremity edema    Rt leg   Neuromuscular disorder (HCC)    TINGLING IN FINGERS and feet   PAF (paroxysmal atrial fibrillation) (HCC)    S/P chemotherapy, time since less than 4 weeks    Tobacco abuse     HEMATOLOGY/ONCOLOGY HISTORY:  Oncology History  Prostate cancer metastatic to bone (HCC)  08/30/2023 Initial Diagnosis   Prostate cancer metastatic to bone (HCC)   08/30/2023 Cancer Staging   Staging form: Prostate, AJCC 8th Edition - Clinical stage from 08/30/2023: Stage IVB (cTX, cN1, cM1b, PSA: 930) - Signed by Jacobo Evalene PARAS, MD on 08/30/2023 Stage prefix: Initial diagnosis Prostate specific antigen (PSA) range: 20 or greater    Genetic Testing   Negative genetic testing. No pathogenic variants identified on the [facility] CancerNext+RNA panel. The report date is 09/22/2023.  The [facility] CancerNext+RNAinsight Panel includes sequencing, rearrangement analysis, and RNA analysis for the following 40 genes: APC, ATM, BAP1, BARD1, BMPR1A, BRCA1, BRCA2, BRIP1,  CDH1, CDKN2A, CHEK2, FH, FLCN, MET, MLH1, MSH2, MSH6, MUTYH, NF1, NTHL1, PALB2, PMS2, PTEN, RAD51C, RAD51D, RPS20, SMAD4, STK11, TP53, TSC1, TSC2, and VHL (sequencing and deletion/duplication); AXIN2, HOXB13, MBD4, MSH3, POLD1 and POLE (sequencing only); EPCAM and GREM1 (deletion/duplication only).     ALLERGIES:  has no known allergies.  MEDICATIONS:  Current Outpatient Medications  Medication Sig Dispense Refill   abiraterone  acetate (ZYTIGA ) 250 MG tablet Take 4 tablets (1,000 mg total) by mouth daily. Take on an empty stomach 1 hour before or 2 hours after a meal 120 tablet 2   acetaminophen  (TYLENOL ) 500 MG tablet Take 500 mg by mouth every 6 (six) hours as needed.     amiodarone  (PACERONE ) 200 MG tablet Take 200 mg by mouth daily.      amLODipine (NORVASC) 10 MG tablet Take 1 tablet by mouth daily.     amlodipine-atorvastatin (CADUET) 10-20 MG tablet Take 1 tablet by mouth daily.     atorvastatin (LIPITOR) 40 MG tablet Take 40 mg by mouth daily.     carbamide peroxide (DEBROX) 6.5 % OTIC solution Place 5 drops in ear(s).     gabapentin  (NEURONTIN ) 300 MG capsule Take 1 capsule (300 mg total) by mouth 3 (three) times daily. (Patient not taking: Reported on 12/13/2023) 90 capsule 1   lisinopril (PRINIVIL,ZESTRIL) 20 MG tablet Take 1 tablet by mouth daily. Reported on 08/12/2015     lisinopril (ZESTRIL) 40 MG tablet Take 40 mg by mouth daily.     metoprolol  tartrate (LOPRESSOR ) 25 MG tablet Take 25 mg by mouth 2 (two) times daily. Am and pm  Oxycodone  HCl 10 MG TABS Take 1 tablet (10 mg total) by mouth every 4 (four) hours as needed. 60 tablet 0   phenazopyridine  (PYRIDIUM ) 95 MG tablet Take 1 tablet (95 mg total) by mouth 3 (three) times daily as needed for pain. 10 tablet 0   predniSONE  (DELTASONE ) 5 MG tablet Take 1 tablet (5 mg total) by mouth daily with breakfast. 30 tablet 2   predniSONE  (STERAPRED UNI-PAK 21 TAB) 10 MG (21) TBPK tablet Take 6 tablets (60 mg) x 1 day, then take 5  tablets (50 mg) x 1 day, then take 4 tablets (40 mg) x 1 day, then take 3 tablets (30 mg) x 1 day, then take 2 tablets (20 mg) x 1 day, then take 1 tablet (10 mg) x 1 day, then resume regular 5 mg/day dosing 21 each 0   rivaroxaban (XARELTO) 20 MG TABS tablet Take 20 mg by mouth daily with supper.     No current facility-administered medications for this visit.   Facility-Administered Medications Ordered in Other Visits  Medication Dose Route Frequency Provider Last Rate Last Admin   0.9 %  sodium chloride  infusion   Intravenous Continuous Finnegan, Timothy J, MD 10 mL/hr at 12/13/23 1047 New Bag at 12/13/23 1047   heparin  lock flush 100 unit/mL  500 Units Intracatheter PRN Jacobo Evalene PARAS, MD       sodium chloride  0.9 % injection 10 mL  10 mL Intracatheter PRN Finnegan, Timothy J, MD   10 mL at 10/19/14 1353   sodium chloride  flush (NS) 0.9 % injection 10 mL  10 mL Intracatheter PRN Jacobo Evalene PARAS, MD       sodium chloride  flush (NS) 0.9 % injection 10 mL  10 mL Intravenous PRN Finnegan, Timothy J, MD   10 mL at 08/13/17 1442    VITAL SIGNS: There were no vitals taken for this visit. There were no vitals filed for this visit.  Estimated body mass index is 25.99 kg/m as calculated from the following:   Height as of an earlier encounter on 12/13/23: 6' 1 (1.854 m).   Weight as of an earlier encounter on 12/13/23: 89.4 kg (197 lb).  LABS: CBC:    Component Value Date/Time   WBC 5.4 12/13/2023 0904   HGB 10.0 (L) 12/13/2023 0904   HGB 13.4 05/12/2014 0856   HCT 31.2 (L) 12/13/2023 0904   HCT 41.0 05/12/2014 0856   PLT 176 12/13/2023 0904   PLT 148 (L) 05/12/2014 0856   MCV 87.4 12/13/2023 0904   MCV 86 05/12/2014 0856   NEUTROABS 3.8 12/13/2023 0904   NEUTROABS 4.2 05/12/2014 0856   LYMPHSABS 1.1 12/13/2023 0904   LYMPHSABS 1.8 05/12/2014 0856   MONOABS 0.4 12/13/2023 0904   MONOABS 0.5 05/12/2014 0856   EOSABS 0.1 12/13/2023 0904   EOSABS 0.1 05/12/2014 0856    BASOSABS 0.0 12/13/2023 0904   BASOSABS 0.1 05/12/2014 0856   Comprehensive Metabolic Panel:    Component Value Date/Time   NA 139 12/13/2023 0904   NA 139 05/12/2014 0856   K 4.9 12/13/2023 0904   K 3.3 (L) 05/12/2014 0856   CL 107 12/13/2023 0904   CL 105 05/12/2014 0856   CO2 24 12/13/2023 0904   CO2 26 05/12/2014 0856   BUN 34 (H) 12/13/2023 0904   BUN 18 05/12/2014 0856   CREATININE 1.63 (H) 12/13/2023 0904   CREATININE 1.58 (H) 05/12/2014 0856   GLUCOSE 109 (H) 12/13/2023 0904   GLUCOSE 143 (H) 05/12/2014  0856   CALCIUM  8.9 12/13/2023 0904   CALCIUM  9.2 05/12/2014 0856   AST 21 12/13/2023 0904   ALT 15 12/13/2023 0904   ALT 24 05/12/2014 0856   ALKPHOS 203 (H) 12/13/2023 0904   ALKPHOS 94 05/12/2014 0856   BILITOT 0.5 12/13/2023 0904   PROT 6.5 12/13/2023 0904   PROT 7.2 05/12/2014 0856   ALBUMIN 3.7 12/13/2023 0904   ALBUMIN 4.1 05/12/2014 0856     Present during today's visit: patient and his partner  Assessment and Plan: CBC/CMP reviewed, continue abiraterone  1000mg  daily Hgb continues to improve, we will continue to monitor PSA today pending, but continues to decrease as of last office visit Patient to receive Zometa  and ADT inj today Of note, patient continues to have watery eyes.  Could be related to seasonal allergies, encouraged patient to try oral antihistamine   Oral Chemotherapy Side Effect/Intolerance:  Edema: Patient is now wearing support stockings which have helped with some with edema, continues elevate feet whenever possible  Oral Chemotherapy Adherence: No missed doses reported No patient barriers to medication adherence identified.   New medications: None reported  Medication Access Issues: No issue, patient fills at [facility] (Specialty)  Patient expressed understanding and was in agreement with this plan. He also understands that He can call clinic at any time with any questions, concerns, or complaints.   Follow-up plan:  RTC in 4 weeks  Thank you for allowing me to participate in the care of this very pleasant patient.   Time Total: 15 mins  Visit consisted of counseling and education on dealing with issues of symptom management in the setting of serious and potentially life-threatening illness.Greater than 50%  of this time was spent counseling and coordinating care related to the above assessment and plan.  Signed by: Ivis Henneman N. Akiel Fennell, PharmD, NEILA, CPP Hematology/Oncology Clinical Pharmacist Practitioner [place] [phone]  12/13/2023 11:14 AM

## 2023-12-14 ENCOUNTER — Encounter: Payer: Self-pay | Admitting: Oncology

## 2023-12-27 ENCOUNTER — Other Ambulatory Visit: Payer: Self-pay

## 2023-12-27 NOTE — Progress Notes (Signed)
 Specialty Pharmacy Ongoing Clinical Assessment Note  James Keller is a 73 y.o. male who is being followed by the specialty pharmacy service for RxSp Oncology   Patient's specialty medication(s) reviewed today: Abiraterone  Acetate (ZYTIGA ); predniSONE  (DELTASONE , STERAPRED UNI-PAK 21 TAB)   Missed doses in the last 4 weeks: 0   Patient/Caregiver did not have any additional questions or concerns.   Therapeutic benefit summary: Patient is achieving benefit   Adverse events/side effects summary: No adverse events/side effects   Patient's therapy is appropriate to: Continue    Goals Addressed             This Visit's Progress    Slow Disease Progression   Improving    Patient is not on track and improving. Patient will maintain adherence and be monitored by provider to determine if a change in treatment plan is warranted. Per visit on 11/20, PSA trended down to 64.64 but now back up slightly to 81.61, plan to have patient back in 4 weeks for further evaluation         Follow up: 3 months  [facility] Specialty Pharmacist

## 2024-01-10 ENCOUNTER — Inpatient Hospital Stay: Admitting: Pharmacist

## 2024-01-10 ENCOUNTER — Other Ambulatory Visit: Payer: Self-pay | Admitting: Oncology

## 2024-01-10 ENCOUNTER — Encounter: Payer: Self-pay | Admitting: Oncology

## 2024-01-10 ENCOUNTER — Inpatient Hospital Stay: Admitting: Oncology

## 2024-01-10 ENCOUNTER — Inpatient Hospital Stay: Attending: Oncology

## 2024-01-10 ENCOUNTER — Inpatient Hospital Stay

## 2024-01-10 VITALS — BP 128/69 | HR 64 | Temp 96.8°F | Resp 18 | Ht 73.0 in | Wt 193.0 lb

## 2024-01-10 DIAGNOSIS — F17210 Nicotine dependence, cigarettes, uncomplicated: Secondary | ICD-10-CM | POA: Diagnosis not present

## 2024-01-10 DIAGNOSIS — C7951 Secondary malignant neoplasm of bone: Secondary | ICD-10-CM

## 2024-01-10 DIAGNOSIS — C61 Malignant neoplasm of prostate: Secondary | ICD-10-CM | POA: Insufficient documentation

## 2024-01-10 DIAGNOSIS — Z5111 Encounter for antineoplastic chemotherapy: Secondary | ICD-10-CM | POA: Insufficient documentation

## 2024-01-10 DIAGNOSIS — Z79899 Other long term (current) drug therapy: Secondary | ICD-10-CM | POA: Diagnosis not present

## 2024-01-10 LAB — CMP (CANCER CENTER ONLY)
ALT: 13 U/L (ref 0–44)
AST: 29 U/L (ref 15–41)
Albumin: 3.9 g/dL (ref 3.5–5.0)
Alkaline Phosphatase: 179 U/L — ABNORMAL HIGH (ref 38–126)
Anion gap: 12 (ref 5–15)
BUN: 30 mg/dL — ABNORMAL HIGH (ref 8–23)
CO2: 23 mmol/L (ref 22–32)
Calcium: 9.3 mg/dL (ref 8.9–10.3)
Chloride: 106 mmol/L (ref 98–111)
Creatinine: 1.46 mg/dL — ABNORMAL HIGH (ref 0.61–1.24)
GFR, Estimated: 51 mL/min — ABNORMAL LOW (ref >60)
Glucose, Bld: 91 mg/dL (ref 70–99)
Potassium: 4.3 mmol/L (ref 3.5–5.1)
Sodium: 141 mmol/L (ref 135–145)
Total Bilirubin: 0.6 mg/dL (ref 0.0–1.2)
Total Protein: 6.8 g/dL (ref 6.5–8.1)

## 2024-01-10 LAB — CBC WITH DIFFERENTIAL/PLATELET
Abs Immature Granulocytes: 0.03 K/uL (ref 0.00–0.07)
Basophils Absolute: 0 K/uL (ref 0.0–0.1)
Basophils Relative: 0 %
Eosinophils Absolute: 0.1 K/uL (ref 0.0–0.5)
Eosinophils Relative: 2 %
HCT: 31.6 % — ABNORMAL LOW (ref 39.0–52.0)
Hemoglobin: 10.1 g/dL — ABNORMAL LOW (ref 13.0–17.0)
Immature Granulocytes: 1 %
Lymphocytes Relative: 22 %
Lymphs Abs: 1.1 K/uL (ref 0.7–4.0)
MCH: 28 pg (ref 26.0–34.0)
MCHC: 32 g/dL (ref 30.0–36.0)
MCV: 87.5 fL (ref 80.0–100.0)
Monocytes Absolute: 0.4 K/uL (ref 0.1–1.0)
Monocytes Relative: 8 %
Neutro Abs: 3.4 K/uL (ref 1.7–7.7)
Neutrophils Relative %: 67 %
Platelets: 202 K/uL (ref 150–400)
RBC: 3.61 MIL/uL — ABNORMAL LOW (ref 4.22–5.81)
RDW: 15.6 % — ABNORMAL HIGH (ref 11.5–15.5)
WBC: 5 K/uL (ref 4.0–10.5)
nRBC: 0 % (ref 0.0–0.2)

## 2024-01-10 LAB — PSA: Prostatic Specific Antigen: 157 ng/mL — ABNORMAL HIGH (ref 0.00–4.00)

## 2024-01-10 MED ORDER — ZOLEDRONIC ACID 4 MG/5ML IV CONC
3.5000 mg | Freq: Once | INTRAVENOUS | Status: AC
  Administered 2024-01-10: 11:00:00 3.5 mg via INTRAVENOUS
  Filled 2024-01-10: qty 4.38

## 2024-01-10 MED ORDER — DEGARELIX ACETATE 80 MG ~~LOC~~ SOLR
80.0000 mg | Freq: Once | SUBCUTANEOUS | Status: AC
  Administered 2024-01-10: 11:00:00 80 mg via SUBCUTANEOUS
  Filled 2024-01-10: qty 4

## 2024-01-10 MED FILL — Sodium Chloride IV Soln 0.9%: INTRAVENOUS | Qty: 250 | Status: AC

## 2024-01-10 NOTE — Progress Notes (Signed)
 Clinical Pharmacist Practitioner Clinic [facility]  [phone] [phone]  Patient Care Team: Center, [facility] as PCP - General Jinny Carmine, MD as Consulting Physician (Gastroenterology) Jacobo Evalene PARAS, MD as Consulting Physician (Oncology)   Name of the patient: James Keller  [id number]  1950-11-10   Date of visit: 01/10/2024  HPI: Patient is a 73 y.o. male with metastatic castartion sensitive prostate cancer. Patient started treatment with abiraterone /prednisone  on 09/20/23.   Reason for Consult: Oral chemotherapy follow-up for abiraterone  therapy.   PAST MEDICAL HISTORY: Past Medical History:  Diagnosis Date   Colon cancer (HCC)    a. 11/2013 s/p colon resection/Hartmann's procedure;  b. Chemo: FOLFOX completed 07/2014.   DVT (deep venous thrombosis) (HCC)    RIGHT LEG/ HX OF   DVT (deep venous thrombosis) (HCC)    Dysrhythmia    Hypertension    Lower extremity edema    Rt leg   Neuromuscular disorder (HCC)    TINGLING IN FINGERS and feet   PAF (paroxysmal atrial fibrillation) (HCC)    S/P chemotherapy, time since less than 4 weeks    Tobacco abuse     HEMATOLOGY/ONCOLOGY HISTORY:  Oncology History  Prostate cancer metastatic to bone (HCC)  08/30/2023 Initial Diagnosis   Prostate cancer metastatic to bone (HCC)   08/30/2023 Cancer Staging   Staging form: Prostate, AJCC 8th Edition - Clinical stage from 08/30/2023: Stage IVB (cTX, cN1, cM1b, PSA: 930) - Signed by Jacobo Evalene PARAS, MD on 08/30/2023 Stage prefix: Initial diagnosis Prostate specific antigen (PSA) range: 20 or greater    Genetic Testing   Negative genetic testing. No pathogenic variants identified on the [employer] CancerNext+RNA panel. The report date is 09/22/2023.  The [employer] CancerNext+RNAinsight Panel includes sequencing, rearrangement analysis, and RNA analysis for the following 40 genes: APC, ATM, BAP1, BARD1, BMPR1A, BRCA1, BRCA2, BRIP1,  CDH1, CDKN2A, CHEK2, FH, FLCN, MET, MLH1, MSH2, MSH6, MUTYH, NF1, NTHL1, PALB2, PMS2, PTEN, RAD51C, RAD51D, RPS20, SMAD4, STK11, TP53, TSC1, TSC2, and VHL (sequencing and deletion/duplication); AXIN2, HOXB13, MBD4, MSH3, POLD1 and POLE (sequencing only); EPCAM and GREM1 (deletion/duplication only).     ALLERGIES:  has no known allergies.  MEDICATIONS:  Current Outpatient Medications  Medication Sig Dispense Refill   abiraterone  acetate (ZYTIGA ) 250 MG tablet Take 4 tablets (1,000 mg total) by mouth daily. Take on an empty stomach 1 hour before or 2 hours after a meal 120 tablet 2   acetaminophen  (TYLENOL ) 500 MG tablet Take 500 mg by mouth every 6 (six) hours as needed.     amiodarone  (PACERONE ) 200 MG tablet Take 200 mg by mouth daily.      amLODipine (NORVASC) 10 MG tablet Take 1 tablet by mouth daily.     amlodipine-atorvastatin (CADUET) 10-20 MG tablet Take 1 tablet by mouth daily.     atorvastatin (LIPITOR) 40 MG tablet Take 40 mg by mouth daily.     carbamide peroxide (DEBROX) 6.5 % OTIC solution Place 5 drops in ear(s).     gabapentin  (NEURONTIN ) 300 MG capsule Take 1 capsule (300 mg total) by mouth 3 (three) times daily. 90 capsule 1   lisinopril (PRINIVIL,ZESTRIL) 20 MG tablet Take 1 tablet by mouth daily. Reported on 08/12/2015     lisinopril (ZESTRIL) 40 MG tablet Take 40 mg by mouth daily.     metoprolol  tartrate (LOPRESSOR ) 25 MG tablet Take 25 mg by mouth 2 (two) times daily. Am and pm     Oxycodone  HCl 10 MG TABS Take  1 tablet (10 mg total) by mouth every 4 (four) hours as needed. 60 tablet 0   phenazopyridine  (PYRIDIUM ) 95 MG tablet Take 1 tablet (95 mg total) by mouth 3 (three) times daily as needed for pain. 10 tablet 0   predniSONE  (DELTASONE ) 5 MG tablet Take 1 tablet (5 mg total) by mouth daily with breakfast. 30 tablet 2   predniSONE  (STERAPRED UNI-PAK 21 TAB) 10 MG (21) TBPK tablet Take 6 tablets (60 mg) x 1 day, then take 5 tablets (50 mg) x 1 day, then take 4 tablets  (40 mg) x 1 day, then take 3 tablets (30 mg) x 1 day, then take 2 tablets (20 mg) x 1 day, then take 1 tablet (10 mg) x 1 day, then resume regular 5 mg/day dosing 21 each 0   rivaroxaban (XARELTO) 20 MG TABS tablet Take 20 mg by mouth daily with supper.     No current facility-administered medications for this visit.   Facility-Administered Medications Ordered in Other Visits  Medication Dose Route Frequency Provider Last Rate Last Admin   0.9 %  sodium chloride  infusion   Intravenous Continuous Finnegan, Timothy J, MD   Stopped at 01/10/24 1100   heparin  lock flush 100 unit/mL  500 Units Intracatheter PRN Jacobo Evalene PARAS, MD       sodium chloride  0.9 % injection 10 mL  10 mL Intracatheter PRN Finnegan, Timothy J, MD   10 mL at 10/19/14 1353   sodium chloride  flush (NS) 0.9 % injection 10 mL  10 mL Intracatheter PRN Jacobo Evalene PARAS, MD       sodium chloride  flush (NS) 0.9 % injection 10 mL  10 mL Intravenous PRN Finnegan, Timothy J, MD   10 mL at 08/13/17 1442    VITAL SIGNS: There were no vitals taken for this visit. There were no vitals filed for this visit.  Estimated body mass index is 25.46 kg/m as calculated from the following:   Height as of an earlier encounter on 01/10/24: 6' 1 (1.854 m).   Weight as of an earlier encounter on 01/10/24: 87.5 kg (193 lb).  LABS: CBC:    Component Value Date/Time   WBC 5.0 01/10/2024 0918   HGB 10.1 (L) 01/10/2024 0918   HGB 13.4 05/12/2014 0856   HCT 31.6 (L) 01/10/2024 0918   HCT 41.0 05/12/2014 0856   PLT 202 01/10/2024 0918   PLT 148 (L) 05/12/2014 0856   MCV 87.5 01/10/2024 0918   MCV 86 05/12/2014 0856   NEUTROABS 3.4 01/10/2024 0918   NEUTROABS 4.2 05/12/2014 0856   LYMPHSABS 1.1 01/10/2024 0918   LYMPHSABS 1.8 05/12/2014 0856   MONOABS 0.4 01/10/2024 0918   MONOABS 0.5 05/12/2014 0856   EOSABS 0.1 01/10/2024 0918   EOSABS 0.1 05/12/2014 0856   BASOSABS 0.0 01/10/2024 0918   BASOSABS 0.1 05/12/2014 0856    Comprehensive Metabolic Panel:    Component Value Date/Time   NA 141 01/10/2024 0918   NA 139 05/12/2014 0856   K 4.3 01/10/2024 0918   K 3.3 (L) 05/12/2014 0856   CL 106 01/10/2024 0918   CL 105 05/12/2014 0856   CO2 23 01/10/2024 0918   CO2 26 05/12/2014 0856   BUN 30 (H) 01/10/2024 0918   BUN 18 05/12/2014 0856   CREATININE 1.46 (H) 01/10/2024 0918   CREATININE 1.58 (H) 05/12/2014 0856   GLUCOSE 91 01/10/2024 0918   GLUCOSE 143 (H) 05/12/2014 0856   CALCIUM  9.3 01/10/2024 0918   CALCIUM  9.2  05/12/2014 0856   AST 29 01/10/2024 0918   ALT 13 01/10/2024 0918   ALT 24 05/12/2014 0856   ALKPHOS 179 (H) 01/10/2024 0918   ALKPHOS 94 05/12/2014 0856   BILITOT 0.6 01/10/2024 0918   PROT 6.8 01/10/2024 0918   PROT 7.2 05/12/2014 0856   ALBUMIN 3.9 01/10/2024 0918   ALBUMIN 4.1 05/12/2014 0856     Present during today's visit: patient and his partner  Assessment and Plan: CBC/CMP reviewed, continue abiraterone  1000mg  daily Hgb which improved at last visit remains stable today PSA today pending, but at last office visit PSA had increased, will continue to monitor and follow-up on today's result  Patient to receive Zometa  and ADT inj today Patient noted weight loss but no change in diet and has decreased his physical activity lately.  Will continue to monitor weight.  Weight today 193 pounds, down from 197 pounds at last office visit   Oral Chemotherapy Side Effect/Intolerance:  Edema: Slight and unchanged, managed with support stockings and elevation  Oral Chemotherapy Adherence: No missed doses reported No patient barriers to medication adherence identified.   New medications: None reported  Medication Access Issues: No issue, patient fills at [facility] (Specialty)  Patient expressed understanding and was in agreement with this plan. He also understands that He can call clinic at any time with any questions, concerns, or complaints.   Follow-up plan: RTC  in 4 weeks  Thank you for allowing me to participate in the care of this very pleasant patient.   Time Total: 15 mins  Visit consisted of counseling and education on dealing with issues of symptom management in the setting of serious and potentially life-threatening illness.Greater than 50%  of this time was spent counseling and coordinating care related to the above assessment and plan.  Signed by: Candelario Steppe N. Yasin Ducat, PharmD, NEILA, CPP Hematology/Oncology Clinical Pharmacist Practitioner [place] [phone]  01/10/2024 12:25 PM

## 2024-01-10 NOTE — Progress Notes (Signed)
 [facility]  Telephone:([phone] [phone]) [phone]  ID: James Keller OB: 1950/01/25  MR#: [id number]  RDW#:[id number]  Patient Care Team: Center, [facility] as PCP - General Jinny Carmine, MD as Consulting Physician (Gastroenterology) Jacobo Evalene JINNY, MD as Consulting Physician (Oncology)  CHIEF COMPLAINT: Stage IVb adenocarcinoma the prostate.  Unknown Gleason score.  INTERVAL HISTORY: Patient returns to clinic today for further evaluation and continuation of Firmagon , Zometa , and Zytiga .  He is concerned with some mild weight loss, but otherwise feels well.  He does not complain of any weakness or fatigue.  He is tolerating his treatments without significant side effects.  He has no neurologic complaints.  He denies any recent fevers or illnesses.  He has a good appetite and denies weight loss.  He denies any chest pain, shortness of breath, cough, or hemoptysis.  He denies any nausea, vomiting, constipation, or diarrhea.  He denies any melena or hematochezia.  He has no urinary complaints.  Patient offers no further specific complaints today.  REVIEW OF SYSTEMS:   Review of Systems  Constitutional:  Positive for weight loss. Negative for fever and malaise/fatigue.  Respiratory: Negative.  Negative for cough, hemoptysis and shortness of breath.   Cardiovascular: Negative.  Negative for chest pain and leg swelling.  Gastrointestinal: Negative.  Negative for abdominal pain, blood in stool, constipation and melena.  Genitourinary: Negative.  Negative for frequency, hematuria and urgency.  Musculoskeletal: Negative.  Negative for back pain.  Skin: Negative.  Negative for rash.  Neurological: Negative.  Negative for dizziness, sensory change, focal weakness, weakness and headaches.  Psychiatric/Behavioral: Negative.  The patient is not nervous/anxious.     As per HPI. Otherwise, a complete review of systems is negative.  PAST MEDICAL  HISTORY: Past Medical History:  Diagnosis Date   Colon cancer (HCC)    a. 11/2013 s/p colon resection/Hartmann's procedure;  b. Chemo: FOLFOX completed 07/2014.   DVT (deep venous thrombosis) (HCC)    RIGHT LEG/ HX OF   DVT (deep venous thrombosis) (HCC)    Dysrhythmia    Hypertension    Lower extremity edema    Rt leg   Neuromuscular disorder (HCC)    TINGLING IN FINGERS and feet   PAF (paroxysmal atrial fibrillation) (HCC)    S/P chemotherapy, time since less than 4 weeks    Tobacco abuse     PAST SURGICAL HISTORY: Past Surgical History:  Procedure Laterality Date   COLON RESECTION  12/19/2013   colostomy   COLON SURGERY     COLONOSCOPY WITH PROPOFOL  N/A 09/14/2014   Procedure: COLONOSCOPY WITH PROPOFOL  THROUGH COLOSTOMY AND COLONOSCOPY THRU RECTUM;  Surgeon: Carmine Jinny, MD;  Location: [facility];  Service: Endoscopy;  Laterality: N/A;  TRANSVERSE COLON POLYP AND SIGMOID COLON POLYP   COLONOSCOPY WITH PROPOFOL  N/A 11/29/2015   Procedure: COLONOSCOPY WITH PROPOFOL ;  Surgeon: Ruel Kung, MD;  Location: [facility];  Service: Endoscopy;  Laterality: N/A;   COLONOSCOPY WITH PROPOFOL  N/A 01/21/2019   Procedure: COLONOSCOPY WITH PROPOFOL ;  Surgeon: Jinny Carmine, MD;  Location: [facility];  Service: Endoscopy;  Laterality: N/A;   COLOSTOMY REVERSAL N/A 10/28/2014   Procedure: COLOSTOMY REVERSAL;  Surgeon: Lonni Brands, MD;  Location: [facility] OR[facility];  Service: General;  Laterality: N/A;   ESOPHAGOGASTRODUODENOSCOPY (EGD) WITH PROPOFOL  N/A 01/21/2019   Procedure: ESOPHAGOGASTRODUODENOSCOPY (EGD) WITH PROPOFOL ;  Surgeon: Jinny Carmine, MD;  Location: [facility];  Service: Endoscopy;  Laterality: N/A;   FEMUR FRACTURE SURGERY Right    FRACTURE  SURGERY     LEG SURGERY     LYSIS OF ADHESION  10/28/2014   Procedure: LYSIS OF ADHESION;  Surgeon: Lonni Brands, MD;  Location: [facility] OR[facility];  Service: General;;   PORT A CATH INJECTION ([facility] HX) Right    TUNNELED VENOUS  PORT PLACEMENT      FAMILY HISTORY: Grandmother with liver cancer.     ADVANCED DIRECTIVES:    HEALTH MAINTENANCE: Social History   Tobacco Use   Smoking status: Every Day    Current packs/day: 0.50    Average packs/day: 0.5 packs/day for 54.0 years (27.0 ttl pk-yrs)    Types: Cigarettes   Smokeless tobacco: Never  Vaping Use   Vaping status: Never Used  Substance Use Topics   Alcohol use: Yes    Alcohol/week: 6.0 standard drinks of alcohol    Types: 6 Cans of beer per week    Comment: moderate beer   Drug use: Yes    Types: Marijuana    Comment: occasional     Colonoscopy:  PAP:  Bone density:  Lipid panel:  No Known Allergies   Current Outpatient Medications  Medication Sig Dispense Refill   abiraterone  acetate (ZYTIGA ) 250 MG tablet Take 4 tablets (1,000 mg total) by mouth daily. Take on an empty stomach 1 hour before or 2 hours after a meal 120 tablet 2   acetaminophen  (TYLENOL ) 500 MG tablet Take 500 mg by mouth every 6 (six) hours as needed.     amiodarone  (PACERONE ) 200 MG tablet Take 200 mg by mouth daily.      amLODipine (NORVASC) 10 MG tablet Take 1 tablet by mouth daily.     amlodipine-atorvastatin (CADUET) 10-20 MG tablet Take 1 tablet by mouth daily.     atorvastatin (LIPITOR) 40 MG tablet Take 40 mg by mouth daily.     carbamide peroxide (DEBROX) 6.5 % OTIC solution Place 5 drops in ear(s).     gabapentin  (NEURONTIN ) 300 MG capsule Take 1 capsule (300 mg total) by mouth 3 (three) times daily. 90 capsule 1   lisinopril (PRINIVIL,ZESTRIL) 20 MG tablet Take 1 tablet by mouth daily. Reported on 08/12/2015     lisinopril (ZESTRIL) 40 MG tablet Take 40 mg by mouth daily.     metoprolol  tartrate (LOPRESSOR ) 25 MG tablet Take 25 mg by mouth 2 (two) times daily. Am and pm     Oxycodone  HCl 10 MG TABS Take 1 tablet (10 mg total) by mouth every 4 (four) hours as needed. 60 tablet 0   phenazopyridine  (PYRIDIUM ) 95 MG tablet Take 1 tablet (95 mg total) by mouth 3  (three) times daily as needed for pain. 10 tablet 0   predniSONE  (DELTASONE ) 5 MG tablet Take 1 tablet (5 mg total) by mouth daily with breakfast. 30 tablet 2   predniSONE  (STERAPRED UNI-PAK 21 TAB) 10 MG (21) TBPK tablet Take 6 tablets (60 mg) x 1 day, then take 5 tablets (50 mg) x 1 day, then take 4 tablets (40 mg) x 1 day, then take 3 tablets (30 mg) x 1 day, then take 2 tablets (20 mg) x 1 day, then take 1 tablet (10 mg) x 1 day, then resume regular 5 mg/day dosing 21 each 0   rivaroxaban (XARELTO) 20 MG TABS tablet Take 20 mg by mouth daily with supper.     No current facility-administered medications for this visit.   Facility-Administered Medications Ordered in Other Visits  Medication Dose Route Frequency Provider Last Rate Last Admin  0.9 %  sodium chloride  infusion   Intravenous Continuous Esco Joslyn J, MD 40 mL/hr at 01/10/24 1024 New Bag at 01/10/24 1024   degarelix  (FIRMAGON ) injection 80 mg  80 mg Subcutaneous Once Cresencio Reesor J, MD       heparin  lock flush 100 unit/mL  500 Units Intracatheter PRN Jacobo Evalene PARAS, MD       sodium chloride  0.9 % injection 10 mL  10 mL Intracatheter PRN Reyanne Hussar J, MD   10 mL at 10/19/14 1353   sodium chloride  flush (NS) 0.9 % injection 10 mL  10 mL Intracatheter PRN Jacobo Evalene PARAS, MD       sodium chloride  flush (NS) 0.9 % injection 10 mL  10 mL Intravenous PRN Willie Loy J, MD   10 mL at 08/13/17 1442   zoledronic  acid (ZOMETA ) 3.5 mg in sodium chloride  0.9 % 100 mL IVPB  3.5 mg Intravenous Once Jacobo Evalene PARAS, MD        OBJECTIVE: Vitals:   01/10/24 0931  BP: 128/69  Pulse: 64  Resp: 18  Temp: (!) 96.8 F (36 C)  SpO2: 100%       Body mass index is 25.46 kg/m.    ECOG FS:0 - Asymptomatic  General: Well-developed, well-nourished, no acute distress. Eyes: Pink conjunctiva, anicteric sclera. HEENT: Normocephalic, moist mucous membranes. Lungs: No audible wheezing or coughing. Heart: Regular  rate and rhythm. Abdomen: Soft, nontender, no obvious distention. Musculoskeletal: No edema, cyanosis, or clubbing. Neuro: Alert, answering all questions appropriately. Cranial nerves grossly intact. Skin: No rashes or petechiae noted. Psych: Normal affect.  LAB RESULTS:  Lab Results  Component Value Date   NA 141 01/10/2024   K 4.3 01/10/2024   CL 106 01/10/2024   CO2 23 01/10/2024   GLUCOSE 91 01/10/2024   BUN 30 (H) 01/10/2024   CREATININE 1.46 (H) 01/10/2024   CALCIUM  9.3 01/10/2024   PROT 6.8 01/10/2024   ALBUMIN 3.9 01/10/2024   AST 29 01/10/2024   ALT 13 01/10/2024   ALKPHOS 179 (H) 01/10/2024   BILITOT 0.6 01/10/2024   GFRNONAA 51 (L) 01/10/2024   GFRAA 45 (L) 05/29/2019    Lab Results  Component Value Date   WBC 5.0 01/10/2024   NEUTROABS 3.4 01/10/2024   HGB 10.1 (L) 01/10/2024   HCT 31.6 (L) 01/10/2024   MCV 87.5 01/10/2024   PLT 202 01/10/2024    STUDIES:    No results found.   ASSESSMENT: Stage IVb adenocarcinoma the prostate.  Unknown Gleason score.  PLAN:    Stage IVb adenocarcinoma the prostate, unknown Gleason score: Patient underwent biopsy of one of his bone lesions on September 17, 2023 confirming diagnosis.  His initial PSA was greater than 1500.  His PSA trended down to 64.64, but now has trended up slightly to 81.61.  Today's result is pending.  PSMA PET confirmed metastatic disease with positive the prostate gland, pelvic lymphadenopathy, and widespread bony metastasis.  Patient initiated Zytiga  in August 2025.  Continue treatment daily. Proceed with Firmagon  and Zometa  today.  Return to clinic in 4 weeks for further evaluation and continuation of treatment at which time we will discontinue Firmagon  and initiate 45 mg Eligard every 6 months.  Appreciate clinical pharmacy input.   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  Previously, patient CEA was chronically  elevated ranging from 4.6-8.6 since at least October 2018.  His most recent  result is 8.6.  He had a colonoscopy and upper endoscopy on January 21, 2019 that did not reveal any significant pathology or evidence of recurrent disease.  No further interventions are needed.   H/o DVT: Patient has discontinued Coumadin  and is now on Xarelto.  Managed by patient's primary care. Chronic renal insufficiency: GFR improved to 51.  Monitor. Anemia: Chronic and unchanged.  Patient's hemoglobin is 10.1 today.  Previously, he reported dark tarry stools, but does not complain of this today.  Iron panel on October 18, 2023 was within normal limits.  Consider referral back to GI for colonoscopy. Neuropathy: Patient does not complain of this today.   Weight loss: Patient's weight has decreased approximately 5 to 7 pounds over the past 4 months.  Monitor.  I spent a total of 30 minutes reviewing chart data, face-to-face evaluation with the patient, counseling and coordination of care as detailed above.    Patient expressed understanding and was in agreement with this plan. He also understands that He can call clinic at any time with any questions, concerns, or complaints.   Colon cancer   Staging form: Colon/Rectum, AJCC 7th Edition     Pathologic stage from 05/04/2014: Stage IIIB (T4, N1, M0) - Signed by Evalene JINNY Reusing, MD on 05/04/2014   Evalene JINNY Reusing, MD   01/10/2024 10:30 AM

## 2024-01-10 NOTE — Progress Notes (Signed)
 Patient is doing ok, he is just disgusted at how he keeps losing weight instead of gaining, because he eats a lot and often.

## 2024-01-11 ENCOUNTER — Ambulatory Visit: Payer: Self-pay | Admitting: *Deleted

## 2024-01-11 ENCOUNTER — Other Ambulatory Visit: Payer: Self-pay | Admitting: *Deleted

## 2024-01-11 DIAGNOSIS — C61 Malignant neoplasm of prostate: Secondary | ICD-10-CM

## 2024-01-11 NOTE — Telephone Encounter (Signed)
 RN placed call to patient to follow up on PSA results, MD recommendations for PET scan prior to next follow up appointment. Patient is agreeable and verbalized understanding. PET scan ordered.

## 2024-01-11 NOTE — Telephone Encounter (Signed)
-----   Message from Evalene Reusing, MD sent at 01/10/2024  4:06 PM EST ----- Definitely do not like this PSA.  Lets repeat imaging prior to next clinic appointment.  Thanks!

## 2024-01-14 ENCOUNTER — Other Ambulatory Visit: Payer: Self-pay

## 2024-01-14 NOTE — Progress Notes (Signed)
 Specialty Pharmacy Refill Coordination Note  James Keller is a 73 y.o. male contacted today regarding refills of specialty medication(s) Abiraterone  Acetate (ZYTIGA ); predniSONE  (DELTASONE , STERAPRED UNI-PAK 21 TAB)   Patient requested Delivery   Delivery date: 01/21/24   Verified address: [address] [place] [address]   Medication will be filled on: 01/18/24

## 2024-01-18 ENCOUNTER — Other Ambulatory Visit: Payer: Self-pay

## 2024-01-28 ENCOUNTER — Other Ambulatory Visit: Payer: Self-pay

## 2024-01-28 ENCOUNTER — Encounter: Payer: Self-pay | Admitting: *Deleted

## 2024-01-28 ENCOUNTER — Emergency Department

## 2024-01-28 ENCOUNTER — Emergency Department
Admission: EM | Admit: 2024-01-28 | Discharge: 2024-01-29 | Disposition: A | Discharge status: Home / Self Care | Attending: Emergency Medicine | Admitting: Emergency Medicine

## 2024-01-28 DIAGNOSIS — C61 Malignant neoplasm of prostate: Secondary | ICD-10-CM | POA: Diagnosis not present

## 2024-01-28 DIAGNOSIS — M8580 Other specified disorders of bone density and structure, unspecified site: Secondary | ICD-10-CM | POA: Insufficient documentation

## 2024-01-28 DIAGNOSIS — M25551 Pain in right hip: Secondary | ICD-10-CM | POA: Diagnosis not present

## 2024-01-28 DIAGNOSIS — M25552 Pain in left hip: Secondary | ICD-10-CM | POA: Insufficient documentation

## 2024-01-28 DIAGNOSIS — X58XXXA Exposure to other specified factors, initial encounter: Secondary | ICD-10-CM | POA: Insufficient documentation

## 2024-01-28 DIAGNOSIS — M898X9 Other specified disorders of bone, unspecified site: Secondary | ICD-10-CM

## 2024-01-28 DIAGNOSIS — S0990XA Unspecified injury of head, initial encounter: Secondary | ICD-10-CM | POA: Diagnosis present

## 2024-01-28 DIAGNOSIS — Z7901 Long term (current) use of anticoagulants: Secondary | ICD-10-CM | POA: Insufficient documentation

## 2024-01-28 DIAGNOSIS — M25561 Pain in right knee: Secondary | ICD-10-CM | POA: Insufficient documentation

## 2024-01-28 DIAGNOSIS — R079 Chest pain, unspecified: Secondary | ICD-10-CM | POA: Insufficient documentation

## 2024-01-28 LAB — CBC
HCT: 32.1 % — ABNORMAL LOW (ref 39.0–52.0)
Hemoglobin: 10.1 g/dL — ABNORMAL LOW (ref 13.0–17.0)
MCH: 27.2 pg (ref 26.0–34.0)
MCHC: 31.5 g/dL (ref 30.0–36.0)
MCV: 86.5 fL (ref 80.0–100.0)
Platelets: 307 K/uL (ref 150–400)
RBC: 3.71 MIL/uL — ABNORMAL LOW (ref 4.22–5.81)
RDW: 15.9 % — ABNORMAL HIGH (ref 11.5–15.5)
WBC: 9.6 K/uL (ref 4.0–10.5)
nRBC: 0 % (ref 0.0–0.2)

## 2024-01-28 LAB — BASIC METABOLIC PANEL WITH GFR
Anion gap: 13 (ref 5–15)
BUN: 20 mg/dL (ref 8–23)
CO2: 21 mmol/L — ABNORMAL LOW (ref 22–32)
Calcium: 9.1 mg/dL (ref 8.9–10.3)
Chloride: 103 mmol/L (ref 98–111)
Creatinine, Ser: 1.24 mg/dL (ref 0.61–1.24)
GFR, Estimated: >60 mL/min
Glucose, Bld: 112 mg/dL — ABNORMAL HIGH (ref 70–99)
Potassium: 4.3 mmol/L (ref 3.5–5.1)
Sodium: 137 mmol/L (ref 135–145)

## 2024-01-28 LAB — TROPONIN T, HIGH SENSITIVITY
Troponin T High Sensitivity: 15 ng/L (ref 0–19)
Troponin T High Sensitivity: 17 ng/L (ref 0–19)

## 2024-01-28 MED ORDER — OXYCODONE-ACETAMINOPHEN 5-325 MG PO TABS
1.0000 | ORAL_TABLET | ORAL | Status: DC | PRN
  Administered 2024-01-28: 1 via ORAL
  Filled 2024-01-28: qty 1

## 2024-01-28 NOTE — ED Triage Notes (Signed)
 Pt reports that he is having pain in the left chest and back with deep breath. Reports going to UC last week with upper respiratory symptoms and has been taking Nyquil.

## 2024-01-28 NOTE — ED Notes (Signed)
 Called lab regarding pending troponin results sent at 1928; lab tech reports will run now

## 2024-01-28 NOTE — ED Provider Notes (Addendum)
 "  [facility] Provider Note    Event Date/Time   First MD Initiated Contact with Patient 01/28/24 2338     (approximate)   History   Chest Pain   HPI  James Keller is a 74 y.o. male with known metastatic prostate cancer to the bone who comes in with chest pain.  Patient reports 2 days of chest pain with taking a deep breath.  He states that he is on Xarelto but does occasionally miss doses.  He denies any new swelling in his legs.  His right leg is always more swollen than his left secondary to a prior injury and has a rod in it.  He reports some increased pain in his bilateral hips and right knee.  He states that he ran out of his oxycodone  and has not followed back up with his oncologist yet for additional pain medication.  He denies any new abdominal pain.  He denies any severe headaches.  Physical Exam   Triage Vital Signs: ED Triage Vitals  Encounter Vitals Group     BP 01/28/24 1920 (!) 149/83     Girls Systolic BP Percentile --      Girls Diastolic BP Percentile --      Boys Systolic BP Percentile --      Boys Diastolic BP Percentile --      Pulse Rate 01/28/24 1920 92     Resp 01/28/24 1920 19     Temp 01/28/24 1920 98.5 F (36.9 C)     Temp Source 01/28/24 1920 Oral     SpO2 01/28/24 1920 99 %     Weight --      Height --      Head Circumference --      Peak Flow --      Pain Score 01/28/24 1926 8     Pain Loc --      Pain Education --      Exclude from Growth Chart --     Most recent vital signs: Vitals:   01/28/24 1920 01/28/24 2235  BP: (!) 149/83 (!) 149/79  Pulse: 92 (!) 101  Resp: 19 18  Temp: 98.5 F (36.9 C) 98.4 F (36.9 C)  SpO2: 99% 97%     General: Awake, no distress.  CV:  Good peripheral perfusion.  Resp:  Normal effort.  Clear lung Abd:  No distention.  Soft and nontender Other:  Slightly able to lift both legs up off the bed but does report some pain in his bilateral hips and a little bit in his right  knee.  Good distal pulses.  Right leg is slightly more swollen but he reports that is baseline for him   ED Results / Procedures / Treatments   Labs (all labs ordered are listed, but only abnormal results are displayed) Labs Reviewed  BASIC METABOLIC PANEL WITH GFR - Abnormal; Notable for the following components:      Result Value   CO2 21 (*)    Glucose, Bld 112 (*)    All other components within normal limits  CBC - Abnormal; Notable for the following components:   RBC 3.71 (*)    Hemoglobin 10.1 (*)    HCT 32.1 (*)    RDW 15.9 (*)    All other components within normal limits  HEPATIC FUNCTION PANEL  TROPONIN T, HIGH SENSITIVITY  TROPONIN T, HIGH SENSITIVITY     EKG  My interpretation of EKG:  Normal sinus rate 93  without any ST elevation or T wave inversions, normal intervals  RADIOLOGY I have reviewed the xray personally and interpreted no evidence of any fracture   PROCEDURES:  Critical Care performed: No  Procedures   MEDICATIONS ORDERED IN ED: Medications  oxyCODONE -acetaminophen  (PERCOCET/ROXICET) 5-325 MG per tablet 1 tablet (1 tablet Oral Given 01/28/24 2243)  iohexol  (OMNIPAQUE ) 350 MG/ML injection 100 mL (100 mLs Intravenous Contrast Given 01/29/24 0021)     IMPRESSION / MDM / ASSESSMENT AND PLAN / ED COURSE  I reviewed the triage vital signs and the nursing notes.   Patient's presentation is most consistent with acute presentation with potential threat to life or bodily function.   Patient comes in with chest pain worse with taking a deep breath on Xarelto but I have seen treatment flare lower with this and he is tachycardic and has risk factors including cancer we will proceed with CT PE.  Will get CT head to make sure no large mass if patient needs anticoagulation.  He does report some pain throughout his hips and right knee.  Good distal pulse unlikely arterial issue.  Unlikely DVT as he denies any new swelling in his legs and again is on  Xarelto.   Cardiac markers are negative unlikely ACS.  Hepatic function similar to prior.  BMP is reassuring CBC shows stable hemoglobin normal white count.  IMPRESSION: 1. Extensive sclerotic lesions throughout the bony pelvis and proximal femurs, compatible with osseous metastatic disease. 2. Intramedullary nail in the right femur across a healed mid to distal femoral shaft fracture.   IMPRESSION: 1. No acute fracture or dislocation. 2. Small joint effusion.  Patient has no warmth redness fever or white count to suggest infection do not feel this is a septic joint.  IMPRESSION: 1. No acute intracranial abnormality.  IMPRESSION: 1. No pulmonary embolism. 2. No acute cardiopulmonary disease. 3. Aortic atherosclerosis, coronary artery disease. 4. Extensive diffuse sclerotic lesions throughout the osseous structures, markedly progressed since the prior study, compatible with worsening osseous metastatic disease.   I suspect patient's pain is related to his known metastatic disease in the setting of having run out of his oxycodone .  Patient has been ambulatory here so doubt an occult fracture but do suspect patient's pain is just related to known metastatic disease.  Did discuss with patient about concern for worsening disease he reports having oncology appointment on Wednesday.  The patient is on the cardiac monitor to evaluate for evidence of arrhythmia and/or significant heart rate changes.      FINAL CLINICAL IMPRESSION(S) / ED DIAGNOSES   Final diagnoses:  Prostate cancer, primary, with metastasis from prostate to other site [facility])  Bone pain     Rx / DC Orders   ED Discharge Orders          Ordered    oxyCODONE  (ROXICODONE ) 5 MG immediate release tablet  Every 6 hours PRN        01/29/24 0205             Note:  This document was prepared using Dragon voice recognition software and may include unintentional dictation errors.   Ernest Ronal BRAVO, MD 01/29/24  LUCILE    Ernest Ronal BRAVO, MD 01/29/24 0205  "

## 2024-01-29 ENCOUNTER — Emergency Department

## 2024-01-29 LAB — HEPATIC FUNCTION PANEL
ALT: 17 U/L (ref 0–44)
AST: 60 U/L — ABNORMAL HIGH (ref 15–41)
Albumin: 3.8 g/dL (ref 3.5–5.0)
Alkaline Phosphatase: 220 U/L — ABNORMAL HIGH (ref 38–126)
Bilirubin, Direct: 0.2 mg/dL (ref 0.0–0.2)
Indirect Bilirubin: 0.2 mg/dL — ABNORMAL LOW (ref 0.3–0.9)
Total Bilirubin: 0.4 mg/dL (ref 0.0–1.2)
Total Protein: 7.1 g/dL (ref 6.5–8.1)

## 2024-01-29 LAB — TROPONIN T, HIGH SENSITIVITY: Troponin T High Sensitivity: 17 ng/L (ref 0–19)

## 2024-01-29 MED ORDER — OXYCODONE HCL 5 MG PO TABS
5.0000 mg | ORAL_TABLET | Freq: Once | ORAL | Status: AC
  Administered 2024-01-29: 5 mg via ORAL
  Filled 2024-01-29: qty 1

## 2024-01-29 MED ORDER — IOHEXOL 350 MG/ML SOLN
100.0000 mL | Freq: Once | INTRAVENOUS | Status: AC | PRN
  Administered 2024-01-29: 100 mL via INTRAVENOUS

## 2024-01-29 MED ORDER — OXYCODONE HCL 5 MG PO TABS: 5.0000 mg | ORAL_TABLET | Freq: Four times a day (QID) | ORAL | 0 refills | Status: DC | PRN

## 2024-01-29 NOTE — Discharge Instructions (Addendum)
 You should call your oncologist to make a follow-up appointment and return to the ER for repeat evaluation if symptoms are changing or worsening such as fevers, inability to ambulate or any other concerns.  I suspect your pain is related to your metastatic disease to your bones I prescribed you some oxycodone  but you should discuss with them the below CT scans and further management of your pain   IMPRESSION: 1. No pulmonary embolism. 2. No acute cardiopulmonary disease. 3. Aortic atherosclerosis, coronary artery disease. 4. Extensive diffuse sclerotic lesions throughout the osseous structures, markedly progressed since the prior study, compatible with worsening osseous metastatic disease. IMPRESSION: 1. Extensive sclerotic lesions throughout the bony pelvis and proximal femurs, compatible with osseous metastatic disease. 2. Intramedullary nail in the right femur across a healed mid to distal femoral shaft fracture.

## 2024-01-30 ENCOUNTER — Ambulatory Visit
Admission: RE | Admit: 2024-01-30 | Discharge: 2024-01-30 | Disposition: A | Discharge status: Home / Self Care | Source: Ambulatory Visit | Attending: Oncology | Admitting: Oncology

## 2024-01-30 DIAGNOSIS — C61 Malignant neoplasm of prostate: Secondary | ICD-10-CM | POA: Diagnosis present

## 2024-01-30 DIAGNOSIS — C7951 Secondary malignant neoplasm of bone: Secondary | ICD-10-CM | POA: Diagnosis not present

## 2024-01-30 MED ORDER — FLOTUFOLASTAT F 18 GALLIUM 296-5846 MBQ/ML IV SOLN
8.7400 | Freq: Once | INTRAVENOUS | Status: AC
  Administered 2024-01-30: 8.74 via INTRAVENOUS

## 2024-02-01 ENCOUNTER — Other Ambulatory Visit: Payer: Self-pay | Admitting: *Deleted

## 2024-02-01 DIAGNOSIS — C61 Malignant neoplasm of prostate: Secondary | ICD-10-CM

## 2024-02-01 DIAGNOSIS — G893 Neoplasm related pain (acute) (chronic): Secondary | ICD-10-CM

## 2024-02-01 DIAGNOSIS — C186 Malignant neoplasm of descending colon: Secondary | ICD-10-CM

## 2024-02-01 MED ORDER — OXYCODONE HCL 5 MG PO TABS: 5.0000 mg | ORAL_TABLET | Freq: Four times a day (QID) | ORAL | 0 refills | Status: DC | PRN

## 2024-02-01 NOTE — Telephone Encounter (Signed)
 Caller verified using pt's full name and dob prior to discussing PHI. Patient girlfriend called the cancer center to request for RF of pain medications. I asked to speak to directly to the patient to triage his pain.  Pt recently evaluated in the ER on 01/28/24 and found to have bone mets in his hips. Patient had a pet scan performed 1/7 and are waiting on test results. Patient was given oxycodone  5 mg in ER with quantity of #12. He has 1 pill left of the oxycodone  and is requesting a RF on the oxycodone  or other medication alternative such as Tramadol . I'm using extra strength Tylenol  500 mg along with the oxycodone . Discussed max amount of Tylenol  is 1 day is 4,000 mg.  Reconciled patient's medications with Girlfriend with patient's permission.  Dr. Jacobo, patient does not have follow-up with you until 1/19. Do you want Josh to see patient for pain mgmt or are you able to RF pt's narcotic at this time.     NURSING SYMPTOM TRIAGE ASSESSMENT & DISPOSITION  Mode of interaction:  Telephone Date of symptom triage interaction:  02/01/2024 Time of symptom triage interaction:  1127  Cancer diagnosis:  Cancer of descending colon metastatic to intra-abdominal lymph node (HCC) [No matching plan found] Last treatment date:  1134 Oral chemotherapy:  Yes, prescription for Zytiga  +Prednisone  with last dose taken 01/31/2024.   Symptom Triage Protocol:  Pain   History of the problem:  Pain Pain Rating (0-10): Currently 8/10, on average 8/10, at worst 10/10, at best 2/10  Pain Location: Patient located in both legs  Pain Quality: Sharp, Burning, Stabbing, and Radiating to his lower back  Associated Pain Symptoms: None  When Did The Pain Start: Went to ER 01/28/2024, but pain started weeks ago. My pain has progressively gotten worse where I could not walk  Is the Pain Constant: Yes  Does the Pain Flare:   No  Does Anything Make the Pain Worse: Yes, ambulation make it worse  Does Anything Make the Pain  Better:  No, the oxycodone  barely takes the edge off the pain. It does for a little while. I can get the pain down to 2 or 3 but then in a few hours it creeps back up to a 8 or a 10/10     Assess for Changes in ADLs: Inability to walk due to pain. Using a Cane to ambulate to prevent falls     In the past, what treatments have you tried for your pain: Oxycodone  5 mg and taking regular strength tylenol .   Did these treatments help: Yes  Additional commentary: Patient requesting Tramadol  in addition to the current medication if appropriate.     Triage Nurse Guidance  Severity Index  Seek emergency care, call an ambulance immediately for: Signs/symptoms of acute injury, spinal cord compression, pathologic fracture, infection, or other life threatening problem Sudden onset of severe weakness or unrelenting localized pain Inability to ambulate or decreased sensation in extremities Loss of control of bowel or bladder Chest pain  Seek medical care within two to four hours for: Sudden onset of moderate to severe pain Pain not responsive to current medication regimen Pain that interferes with mobility  Seek medical care within twenty-four hours for: Mild to moderate pain that has been increasing Pain that is interfering with activity or sleep  Follow home care instructions for: Mild to moderate aches and pains Notify the provider if no improvement within twenty-four hours   Provider Consulted  Provider name and  credentials: Msg sent to Dr. Jacobo and [facility]  Provider instruction: In-person follow-up visit:  next Wk with Josh -Palliative Care to follow-up on pain mgmt.; ok to RF pain medications.- script pended     Patient Instruction  Disclaimer:  Patient specific and [employer] instruction provided verbally and sent through MyChart for active users.    Report the following problems No improvement in pain Pain that does not subside with interventions Other side effects, such as  sedation, nausea, or constipation  Seek emergency care immediately if any of the following occur: Excruciating pain Immobility Low back pain associated with loss of bladder or bowel control; bilateral extremity weakness  Teach Back Method used:  Yes     Nurse Triage Priority:  Non-urgent (review and reinforce self-care strategies)  Barriers to Care:  None identified  Nurse Triage Disposition:  In-person follow-up visit:  next Wk with Josh to follow-up on pain mgmt.; ok to RF pain medications. Pt agreeable to plan. Unable to send instructions to patient's mychart as the acct is not active.  Protocol Source:  Ruthine HERO., & Eldonna CANDIE Pee.). (2019). Telephone triage for oncology nurses (3rd ed.). Oncology Nursing Society.

## 2024-02-04 ENCOUNTER — Encounter: Payer: Self-pay | Admitting: Hospice and Palliative Medicine

## 2024-02-04 ENCOUNTER — Inpatient Hospital Stay: Attending: Oncology | Admitting: Hospice and Palliative Medicine

## 2024-02-04 VITALS — BP 127/74 | HR 67 | Temp 98.1°F | Resp 16 | Wt 191.0 lb

## 2024-02-04 DIAGNOSIS — Z79899 Other long term (current) drug therapy: Secondary | ICD-10-CM | POA: Diagnosis not present

## 2024-02-04 DIAGNOSIS — C772 Secondary and unspecified malignant neoplasm of intra-abdominal lymph nodes: Secondary | ICD-10-CM | POA: Diagnosis not present

## 2024-02-04 DIAGNOSIS — C186 Malignant neoplasm of descending colon: Secondary | ICD-10-CM | POA: Diagnosis not present

## 2024-02-04 DIAGNOSIS — C7951 Secondary malignant neoplasm of bone: Secondary | ICD-10-CM | POA: Insufficient documentation

## 2024-02-04 DIAGNOSIS — G893 Neoplasm related pain (acute) (chronic): Secondary | ICD-10-CM | POA: Diagnosis not present

## 2024-02-04 DIAGNOSIS — C61 Malignant neoplasm of prostate: Secondary | ICD-10-CM | POA: Insufficient documentation

## 2024-02-04 DIAGNOSIS — Z5111 Encounter for antineoplastic chemotherapy: Secondary | ICD-10-CM | POA: Insufficient documentation

## 2024-02-04 MED ORDER — MIRTAZAPINE 7.5 MG PO TABS: 7.5000 mg | ORAL_TABLET | Freq: Every day | ORAL | 0 refills | Status: AC

## 2024-02-04 MED ORDER — OXYCODONE HCL 10 MG PO TABS: 10.0000 mg | ORAL_TABLET | ORAL | 0 refills | Status: DC | PRN

## 2024-02-04 NOTE — Progress Notes (Signed)
 "    Palliative Medicine [facility] at [facility] [phone] [phone]   Name: James Keller Date: 02/04/2024 MRN: [id number]  DOB: 03-07-1950  Patient Care Team: Center, [facility] as PCP - General Jinny Carmine, MD as Consulting Physician (Gastroenterology) Jacobo Evalene JINNY, MD as Consulting Physician (Oncology)    REASON FOR CONSULTATION: James Keller is a 74 y.o. 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  Patient has had pain and was referred to palliative care to address goals of manage ongoing symptoms.  SOCIAL HISTORY:     reports that he has been smoking cigarettes. He has a 27 pack-year smoking history. He has never used smokeless tobacco. He reports current alcohol use of about 6.0 standard drinks of alcohol per week. He reports current drug use. Drug: Marijuana.  Patient is married lives at home with his wife.  They have 1 son who is currently incarcerated.  Patient previously worked doing tree care.  ADVANCE DIRECTIVES:    CODE STATUS:   PAST MEDICAL HISTORY: Past Medical History:  Diagnosis Date   Colon cancer (HCC)    a. 11/2013 s/p colon resection/Hartmann's procedure;  b. Chemo: FOLFOX completed 07/2014.   DVT (deep venous thrombosis) (HCC)    RIGHT LEG/ HX OF   DVT (deep venous thrombosis) (HCC)    Dysrhythmia    Hypertension    Lower extremity edema    Rt leg   Neuromuscular disorder (HCC)    TINGLING IN FINGERS and feet   PAF (paroxysmal atrial fibrillation) (HCC)    S/P chemotherapy, time since less than 4 weeks    Tobacco abuse     PAST SURGICAL HISTORY:  Past Surgical History:  Procedure Laterality Date   COLON RESECTION  12/19/2013   colostomy   COLON SURGERY     COLONOSCOPY WITH PROPOFOL  N/A 09/14/2014   Procedure: COLONOSCOPY WITH PROPOFOL  THROUGH COLOSTOMY AND COLONOSCOPY THRU RECTUM;  Surgeon: Carmine Jinny, MD;  Location: [facility];  Service: Endoscopy;  Laterality: N/A;  TRANSVERSE COLON POLYP AND SIGMOID COLON POLYP   COLONOSCOPY WITH PROPOFOL  N/A 11/29/2015   Procedure: COLONOSCOPY WITH PROPOFOL ;  Surgeon: Ruel Kung, MD;  Location: [facility];  Service: Endoscopy;  Laterality: N/A;   COLONOSCOPY WITH PROPOFOL  N/A 01/21/2019   Procedure: COLONOSCOPY WITH PROPOFOL ;  Surgeon: Jinny Carmine, MD;  Location: [facility];  Service: Endoscopy;  Laterality: N/A;   COLOSTOMY REVERSAL N/A 10/28/2014   Procedure: COLOSTOMY REVERSAL;  Surgeon: Lonni Brands, MD;  Location: [facility] OR[facility];  Service: General;  Laterality: N/A;   ESOPHAGOGASTRODUODENOSCOPY (EGD) WITH PROPOFOL  N/A 01/21/2019   Procedure: ESOPHAGOGASTRODUODENOSCOPY (EGD) WITH PROPOFOL ;  Surgeon: Jinny Carmine, MD;  Location: [facility];  Service: Endoscopy;  Laterality: N/A;   FEMUR FRACTURE SURGERY Right    FRACTURE SURGERY     LEG SURGERY     LYSIS OF ADHESION  10/28/2014   Procedure: LYSIS OF ADHESION;  Surgeon: Lonni Brands, MD;  Location: [facility];  Service: General;;   PORT A CATH INJECTION ([facility] HX) Right    TUNNELED VENOUS PORT PLACEMENT      HEMATOLOGY/ONCOLOGY HISTORY:  Oncology History  Prostate cancer metastatic to bone (HCC)  08/30/2023 Initial Diagnosis   Prostate cancer metastatic to bone (HCC)   08/30/2023 Cancer Staging   Staging form: Prostate, AJCC 8th Edition - Clinical stage from 08/30/2023: Stage IVB (cTX, cN1, cM1b, PSA: 930) - Signed by Jacobo Evalene JINNY, MD on 08/30/2023  Stage prefix: Initial diagnosis Prostate specific antigen (PSA) range: 20 or greater    Genetic Testing   Negative genetic testing. No pathogenic variants identified on the [employer] Cancer[employer]+RNA panel. The report date is 09/22/2023.  The [employer] Cancer[employer]+RNAinsight Panel includes sequencing, rearrangement analysis, and RNA analysis for the following 40 genes: APC, ATM, BAP1, BARD1, BMPR1A, BRCA1, BRCA2, BRIP1, CDH1,  CDKN2A, CHEK2, FH, FLCN, MET, MLH1, MSH2, MSH6, MUTYH, NF1, NTHL1, PALB2, PMS2, PTEN, RAD51C, RAD51D, RPS20, SMAD4, STK11, TP53, TSC1, TSC2, and VHL (sequencing and deletion/duplication); AXIN2, HOXB13, MBD4, MSH3, POLD1 and POLE (sequencing only); EPCAM and GREM1 (deletion/duplication only).     ALLERGIES:  has no known allergies.  MEDICATIONS:  Current Outpatient Medications  Medication Sig Dispense Refill   abiraterone  acetate (ZYTIGA ) 250 MG tablet Take 4 tablets (1,000 mg total) by mouth daily. Take on an empty stomach 1 hour before or 2 hours after a meal 120 tablet 2   acetaminophen  (TYLENOL ) 500 MG tablet Take 500 mg by mouth every 6 (six) hours as needed.     amiodarone  (PACERONE ) 200 MG tablet Take 200 mg by mouth daily.      amLODipine (NORVASC) 10 MG tablet Take 1 tablet by mouth daily.     atorvastatin (LIPITOR) 40 MG tablet Take 40 mg by mouth daily.     gabapentin  (NEURONTIN ) 300 MG capsule Take 1 capsule (300 mg total) by mouth 3 (three) times daily. (Patient taking differently: Take 300 mg by mouth daily.) 90 capsule 1   lisinopril (ZESTRIL) 40 MG tablet Take 40 mg by mouth daily.     metoprolol  tartrate (LOPRESSOR ) 25 MG tablet Take 25 mg by mouth 2 (two) times daily. Am and pm     oxyCODONE  (ROXICODONE ) 5 MG immediate release tablet Take 1 tablet (5 mg total) by mouth every 6 (six) hours as needed for up to 15 days for severe pain (pain score 7-10) or moderate pain (pain score 4-6). 60 tablet 0   predniSONE  (DELTASONE ) 5 MG tablet Take 1 tablet (5 mg total) by mouth daily with breakfast. 30 tablet 2   rivaroxaban (XARELTO) 20 MG TABS tablet Take 20 mg by mouth daily with supper.     No current facility-administered medications for this visit.   Facility-Administered Medications Ordered in Other Visits  Medication Dose Route Frequency Provider Last Rate Last Admin   heparin  lock flush 100 unit/mL  500 Units Intracatheter PRN Jacobo Evalene PARAS, MD       sodium chloride   0.9 % injection 10 mL  10 mL Intracatheter PRN Finnegan, Timothy J, MD   10 mL at 10/19/14 1353   sodium chloride  flush (NS) 0.9 % injection 10 mL  10 mL Intracatheter PRN Jacobo Evalene PARAS, MD       sodium chloride  flush (NS) 0.9 % injection 10 mL  10 mL Intravenous PRN Finnegan, Timothy J, MD   10 mL at 08/13/17 1442    VITAL SIGNS: There were no vitals taken for this visit. There were no vitals filed for this visit.  Estimated body mass index is 25.46 kg/m as calculated from the following:   Height as of 01/10/24: 6' 1 (1.854 m).   Weight as of 01/10/24: 193 lb (87.5 kg).  LABS: CBC:    Component Value Date/Time   WBC 9.6 01/28/2024 1928   HGB 10.1 (L) 01/28/2024 1928   HGB 13.4 05/12/2014 0856   HCT 32.1 (L) 01/28/2024 1928   HCT 41.0 05/12/2014 0856   PLT 307 01/28/2024 1928  PLT 148 (L) 05/12/2014 0856   MCV 86.5 01/28/2024 1928   MCV 86 05/12/2014 0856   NEUTROABS 3.4 01/10/2024 0918   NEUTROABS 4.2 05/12/2014 0856   LYMPHSABS 1.1 01/10/2024 0918   LYMPHSABS 1.8 05/12/2014 0856   MONOABS 0.4 01/10/2024 0918   MONOABS 0.5 05/12/2014 0856   EOSABS 0.1 01/10/2024 0918   EOSABS 0.1 05/12/2014 0856   BASOSABS 0.0 01/10/2024 0918   BASOSABS 0.1 05/12/2014 0856   Comprehensive Metabolic Panel:    Component Value Date/Time   NA 137 01/28/2024 1928   NA 139 05/12/2014 0856   K 4.3 01/28/2024 1928   K 3.3 (L) 05/12/2014 0856   CL 103 01/28/2024 1928   CL 105 05/12/2014 0856   CO2 21 (L) 01/28/2024 1928   CO2 26 05/12/2014 0856   BUN 20 01/28/2024 1928   BUN 18 05/12/2014 0856   CREATININE 1.24 01/28/2024 1928   CREATININE 1.46 (H) 01/10/2024 0918   CREATININE 1.58 (H) 05/12/2014 0856   GLUCOSE 112 (H) 01/28/2024 1928   GLUCOSE 143 (H) 05/12/2014 0856   CALCIUM  9.1 01/28/2024 1928   CALCIUM  9.2 05/12/2014 0856   AST 60 (H) 01/28/2024 2239   AST 29 01/10/2024 0918   ALT 17 01/28/2024 2239   ALT 13 01/10/2024 0918   ALT 24 05/12/2014 0856   ALKPHOS 220 (H)  01/28/2024 2239   ALKPHOS 94 05/12/2014 0856   BILITOT 0.4 01/28/2024 2239   BILITOT 0.6 01/10/2024 0918   PROT 7.1 01/28/2024 2239   PROT 7.2 05/12/2014 0856   ALBUMIN 3.8 01/28/2024 2239   ALBUMIN 4.1 05/12/2014 0856    RADIOGRAPHIC STUDIES: DG HIPS BILAT WITH PELVIS MIN 5 VIEWS Result Date: 01/29/2024 EXAM: 5 VIEW(S) XRAY OF THE BILATERAL HIP 01/29/2024 12:56:00 AM COMPARISON: None available. CLINICAL HISTORY: 809823 Fall [id number] FINDINGS: BONES AND JOINTS: Extensive sclerotic lesions throughout the bony pelvis and proximal femurs compatible with osseous metastatic disease. Intramedullary nail in the right femur across an old healed mid to distal femoral shaft fracture. No malalignment. SOFT TISSUES: The soft tissues are unremarkable. IMPRESSION: 1. Extensive sclerotic lesions throughout the bony pelvis and proximal femurs, compatible with osseous metastatic disease. 2. Intramedullary nail in the right femur across a healed mid to distal femoral shaft fracture. Electronically signed by: Franky Crease MD 01/29/2024 01:06 AM EST RP Workstation: [id number]   DG Knee Complete 4 Views Right Result Date: 01/29/2024 EXAM: 4 OR MORE VIEW(S) XRAY OF THE RIGHT KNEE 01/29/2024 12:56:00 AM COMPARISON: None available. CLINICAL HISTORY: fall FINDINGS: BONES AND JOINTS: Right femoral intramedullary nail partially visualized in the distal right femur across a partially visualized healed distal femoral fracture. Early spurring in the medial and patellofemoral compartments. No malalignment. No subluxation or dislocation. Small joint effusion. SOFT TISSUES: Vascular calcifications. IMPRESSION: 1. No acute fracture or dislocation. 2. Small joint effusion. Electronically signed by: Franky Crease MD 01/29/2024 01:05 AM EST RP Workstation: [id number]   CT HEAD WO CONTRAST ( ) Result Date: 01/29/2024 EXAM: CT HEAD WITHOUT 01/29/2024 12:34:52 AM TECHNIQUE: CT of the head was performed without the administration of intravenous  contrast. Automated exposure control, iterative reconstruction, and/or weight based adjustment of the mA/kV was utilized to reduce the radiation dose to as low as reasonably achievable. COMPARISON: 01/06/2020 CLINICAL HISTORY: Head trauma, minor (Age >= 65y) FINDINGS: BRAIN AND VENTRICLES: No acute intracranial hemorrhage. No mass effect or midline shift. No extra-axial fluid collection. No evidence of acute infarct. No hydrocephalus. Atrophy and chronic small vessel disease throughout the deep  white matter. ORBITS: No acute abnormality. SINUSES AND MASTOIDS: No acute abnormality. SOFT TISSUES AND SKULL: No acute skull fracture. No acute soft tissue abnormality. IMPRESSION: 1. No acute intracranial abnormality. Electronically signed by: Franky Crease MD 01/29/2024 12:43 AM EST RP Workstation: [id number]   CT Angio Chest PE W and/or Wo Contrast Result Date: 01/29/2024 EXAM: CTA CHEST 01/29/2024 12:34:52 AM TECHNIQUE: CTA of the chest was performed without and with the administration of 100 mL of iohexol  (OMNIPAQUE ) 350 MG/ML injection. Multiplanar reformatted images are provided for review. MIP images are provided for review. Automated exposure control, iterative reconstruction, and/or weight based adjustment of the mA/kV was utilized to reduce the radiation dose to as low as reasonably achievable. COMPARISON: 08/02/2023 CLINICAL HISTORY: sob FINDINGS: PULMONARY ARTERIES: Pulmonary arteries are adequately opacified for evaluation. No acute pulmonary embolus. Main pulmonary artery is normal in caliber. MEDIASTINUM: Coronary artery atherosclerosis. Aortic atherosclerosis. The pericardium demonstrates no acute abnormality. There is no acute abnormality of the thoracic aorta. LYMPH NODES: No mediastinal, hilar or axillary lymphadenopathy. LUNGS AND PLEURA: Dependent atelectasis. Previously seen posterior right lower lobe pulmonary nodule not definitively seen on today's study, likely obscured by the dependent  atelectasis. No evidence of pleural effusion or pneumothorax. UPPER ABDOMEN: Limited images of the upper abdomen are unremarkable. SOFT TISSUES AND BONES: Extensive diffuse sclerotic lesions throughout the osseous structures, markedly progressed since the prior study, are compatible with worsening osseous metastatic disease. IMPRESSION: 1. No pulmonary embolism. 2. No acute cardiopulmonary disease. 3. Aortic atherosclerosis, coronary artery disease. 4. Extensive diffuse sclerotic lesions throughout the osseous structures, markedly progressed since the prior study, compatible with worsening osseous metastatic disease. Electronically signed by: Franky Crease MD 01/29/2024 12:42 AM EST RP Workstation: [id number]   DG Chest 2 View Result Date: 01/28/2024 EXAM: 2 VIEW(S) XRAY OF THE CHEST 01/28/2024 08:07:56 PM COMPARISON: CT chest abdomen and pelvis dated 08/02/2023. CLINICAL HISTORY: left sided chest pain and back pain FINDINGS: LUNGS AND PLEURA: Low lung volume. Linear atelectasis at left lung base. Elevated left hemidiaphragm. No pleural effusion. No pneumothorax. HEART AND MEDIASTINUM: Ectatic thoracic aorta. No acute abnormality of the cardiac silhouette. BONES AND SOFT TISSUES: Diffuse sclerotic lesions are again seen throughout the osseous structures, as noted on the CT chest abdomen and pelvis dated 08/02/2023. No acute osseous abnormality. IMPRESSION: 1. No acute cardiopulmonary abnormality. 2. Diffuse skeletal sclerosis, most consistent with osteoblastic metastatic disease. Electronically signed by: Greig Pique MD 01/28/2024 08:11 PM EST RP Workstation: [id number]    PERFORMANCE STATUS (ECOG) : 2 - Symptomatic, <50% confined to bed  Review of Systems Unless otherwise noted, a complete review of systems is negative.  Physical Exam General: NAD Cardiovascular: regular rate and rhythm Pulmonary: clear ant fields Abdomen: soft, nontender, + bowel sounds GU: no suprapubic tenderness Extremities: no  edema, no joint deformities Skin: no rashes Neurological: Weakness but otherwise nonfocal  IMPRESSION: Patient was in the emergency department on 01/28/2023 with chest pain and pain with deep inspiration.  Patient is anticoagulated on Xarelto.  CTA did not reveal pulmonary embolism or acute cardiopulmonary disease.  However, patient was noted to have diffuse sclerotic lesions throughout the osseous structures, which was thought the likely culprit for his pain.  Additionally, pain has been worse since patient had run out of oxycodone .  Oxycodone  was refilled and patient was discharged home.  Patient endorses ongoing generalized pain in the bones.  He says that he has doubled his oxycodone  to 10 mg, which he finds more effective.  He denies any  adverse effects other than mild constipation.  Will liberalize oxycodone  10 mg every 4 hours as needed.  Discussed differences tween short acting and long-acting opioids.  If needed, can start him on a long-acting opioid if pain remains persistent.  Patient also endorses poor appetite, depressive symptoms, and insomnia.  Will start on mirtazapine .  At baseline, patient lives at home with his wife.  He has been less mobile recently due to pain but was working doing tree work several months ago.  Patient has 1 son who is currently incarcerated.  PLAN: - Continue current scope of treatment - Increase oxycodone  10 mg every 4 hours as needed. Will go ahead and send in new rx for 10mg  tablets but patient will use up current prescription first.  - PDMP - Daily bowel regimen of MiraLAX /senna - Referral to nutrition - Referral for rehab screening - Start mirtazapine  7.5 mg nightly - RTC next week   Patient expressed understanding and was in agreement with this plan. He also understands that He can call the clinic at any time with any questions, concerns, or complaints.     Time Total: 15 minutes  Visit consisted of counseling and education dealing with  the complex and emotionally intense issues of symptom management and palliative care in the setting of serious and potentially life-threatening illness.Greater than 50%  of this time was spent counseling and coordinating care related to the above assessment and plan.  Signed by: Fonda Mower, PhD, NP-C   "

## 2024-02-04 NOTE — Progress Notes (Signed)
 Pt in for [facility] due to poor pain control and needing refill on oxycodone  5 mg. Pt reports using tylenol  extra strength. Pt seen in ED last Monday for pain in chest.

## 2024-02-08 ENCOUNTER — Other Ambulatory Visit (HOSPITAL_COMMUNITY): Payer: Self-pay

## 2024-02-08 NOTE — Progress Notes (Signed)
 Specialty Pharmacy Refill Coordination Note  James Keller is a 74 y.o. male contacted today regarding refills of specialty medication(s) Abiraterone  Acetate (ZYTIGA ); predniSONE  (DELTASONE )   Patient requested Delivery   Delivery date: 02/15/24   Verified address: [address] [place] [address]   Medication will be filled on: 02/14/24

## 2024-02-11 ENCOUNTER — Inpatient Hospital Stay

## 2024-02-11 ENCOUNTER — Inpatient Hospital Stay: Admitting: Pharmacist

## 2024-02-11 ENCOUNTER — Encounter: Payer: Self-pay | Admitting: Oncology

## 2024-02-11 ENCOUNTER — Inpatient Hospital Stay: Admitting: Hospice and Palliative Medicine

## 2024-02-11 ENCOUNTER — Inpatient Hospital Stay: Admitting: Oncology

## 2024-02-11 VITALS — BP 116/67 | HR 78 | Temp 97.1°F | Resp 19

## 2024-02-11 VITALS — BP 115/68 | HR 92 | Temp 97.8°F | Resp 18 | Ht 73.0 in | Wt 187.0 lb

## 2024-02-11 DIAGNOSIS — Z5111 Encounter for antineoplastic chemotherapy: Secondary | ICD-10-CM | POA: Diagnosis not present

## 2024-02-11 DIAGNOSIS — C772 Secondary and unspecified malignant neoplasm of intra-abdominal lymph nodes: Secondary | ICD-10-CM | POA: Diagnosis not present

## 2024-02-11 DIAGNOSIS — C61 Malignant neoplasm of prostate: Secondary | ICD-10-CM

## 2024-02-11 DIAGNOSIS — C186 Malignant neoplasm of descending colon: Secondary | ICD-10-CM

## 2024-02-11 DIAGNOSIS — G893 Neoplasm related pain (acute) (chronic): Secondary | ICD-10-CM | POA: Diagnosis not present

## 2024-02-11 DIAGNOSIS — C7951 Secondary malignant neoplasm of bone: Secondary | ICD-10-CM | POA: Diagnosis not present

## 2024-02-11 LAB — CBC WITH DIFFERENTIAL/PLATELET
Abs Immature Granulocytes: 0.08 K/uL — ABNORMAL HIGH (ref 0.00–0.07)
Basophils Absolute: 0 K/uL (ref 0.0–0.1)
Basophils Relative: 1 %
Eosinophils Absolute: 0.1 K/uL (ref 0.0–0.5)
Eosinophils Relative: 1 %
HCT: 29.6 % — ABNORMAL LOW (ref 39.0–52.0)
Hemoglobin: 9.2 g/dL — ABNORMAL LOW (ref 13.0–17.0)
Immature Granulocytes: 1 %
Lymphocytes Relative: 22 %
Lymphs Abs: 1.3 K/uL (ref 0.7–4.0)
MCH: 26.7 pg (ref 26.0–34.0)
MCHC: 31.1 g/dL (ref 30.0–36.0)
MCV: 85.8 fL (ref 80.0–100.0)
Monocytes Absolute: 0.3 K/uL (ref 0.1–1.0)
Monocytes Relative: 5 %
Neutro Abs: 4.1 K/uL (ref 1.7–7.7)
Neutrophils Relative %: 70 %
Platelets: 388 K/uL (ref 150–400)
RBC: 3.45 MIL/uL — ABNORMAL LOW (ref 4.22–5.81)
RDW: 15.9 % — ABNORMAL HIGH (ref 11.5–15.5)
WBC: 5.9 K/uL (ref 4.0–10.5)
nRBC: 0 % (ref 0.0–0.2)

## 2024-02-11 LAB — CMP (CANCER CENTER ONLY)
ALT: 36 U/L (ref 0–44)
AST: 77 U/L — ABNORMAL HIGH (ref 15–41)
Albumin: 3.4 g/dL — ABNORMAL LOW (ref 3.5–5.0)
Alkaline Phosphatase: 206 U/L — ABNORMAL HIGH (ref 38–126)
Anion gap: 12 (ref 5–15)
BUN: 22 mg/dL (ref 8–23)
CO2: 22 mmol/L (ref 22–32)
Calcium: 9.7 mg/dL (ref 8.9–10.3)
Chloride: 107 mmol/L (ref 98–111)
Creatinine: 1.38 mg/dL — ABNORMAL HIGH (ref 0.61–1.24)
GFR, Estimated: 54 mL/min — ABNORMAL LOW
Glucose, Bld: 111 mg/dL — ABNORMAL HIGH (ref 70–99)
Potassium: 4.7 mmol/L (ref 3.5–5.1)
Sodium: 140 mmol/L (ref 135–145)
Total Bilirubin: 0.3 mg/dL (ref 0.0–1.2)
Total Protein: 7.1 g/dL (ref 6.5–8.1)

## 2024-02-11 LAB — PSA: Prostatic Specific Antigen: 291 ng/mL — ABNORMAL HIGH (ref 0.00–4.00)

## 2024-02-11 MED ORDER — LEUPROLIDE ACETATE (6 MONTH) 45 MG ~~LOC~~ KIT
45.0000 mg | PACK | Freq: Once | SUBCUTANEOUS | Status: AC
  Administered 2024-02-11: 45 mg via SUBCUTANEOUS
  Filled 2024-02-11: qty 45

## 2024-02-11 MED ORDER — SODIUM CHLORIDE 0.9 % IV SOLN
Freq: Once | INTRAVENOUS | Status: AC
  Filled 2024-02-11: qty 250

## 2024-02-11 MED ORDER — ZOLEDRONIC ACID 4 MG/5ML IV CONC
3.5000 mg | Freq: Once | INTRAVENOUS | Status: AC
  Administered 2024-02-11: 3.5 mg via INTRAVENOUS
  Filled 2024-02-11: qty 4.38

## 2024-02-11 NOTE — Progress Notes (Signed)
 "  Clinical Pharmacist Practitioner [facility]  [phone] [phone]  Patient Care Team: Center, [facility] as PCP - General Jinny Carmine, MD as Consulting Physician (Gastroenterology) Jacobo Evalene PARAS, MD as Consulting Physician (Oncology)   Name of the patient: James Keller  [id number]  September 11, 1950   Date of visit: 02/11/24  HPI: Patient is a 74 y.o. male with metastatic castartion sensitive prostate cancer. Patient started treatment with abiraterone /prednisone  on 09/20/23.   Reason for Consult: Oral chemotherapy follow-up for abiraterone  therapy.   PAST MEDICAL HISTORY: Past Medical History:  Diagnosis Date   Colon cancer (HCC)    a. 11/2013 s/p colon resection/Hartmann's procedure;  b. Chemo: FOLFOX completed 07/2014.   DVT (deep venous thrombosis) (HCC)    RIGHT LEG/ HX OF   DVT (deep venous thrombosis) (HCC)    Dysrhythmia    Hypertension    Lower extremity edema    Rt leg   Neuromuscular disorder (HCC)    TINGLING IN FINGERS and feet   PAF (paroxysmal atrial fibrillation) (HCC)    S/P chemotherapy, time since less than 4 weeks    Tobacco abuse     HEMATOLOGY/ONCOLOGY HISTORY:  Oncology History  Prostate cancer metastatic to bone (HCC)  08/30/2023 Initial Diagnosis   Prostate cancer metastatic to bone (HCC)   08/30/2023 Cancer Staging   Staging form: Prostate, AJCC 8th Edition - Clinical stage from 08/30/2023: Stage IVB (cTX, cN1, cM1b, PSA: 930) - Signed by Jacobo Evalene PARAS, MD on 08/30/2023 Stage prefix: Initial diagnosis Prostate specific antigen (PSA) range: 20 or greater    Genetic Testing   Negative genetic testing. No pathogenic variants identified on the [facility] Cancer[facility]+RNA panel. The report date is 09/22/2023.  The [facility] Cancer[facility]+RNAinsight Panel includes sequencing, rearrangement analysis, and RNA analysis for the following 40 genes: APC, ATM, BAP1, BARD1, BMPR1A, BRCA1, BRCA2, BRIP1,  CDH1, CDKN2A, CHEK2, FH, FLCN, MET, MLH1, MSH2, MSH6, MUTYH, NF1, NTHL1, PALB2, PMS2, PTEN, RAD51C, RAD51D, RPS20, SMAD4, STK11, TP53, TSC1, TSC2, and VHL (sequencing and deletion/duplication); AXIN2, HOXB13, MBD4, MSH3, POLD1 and POLE (sequencing only); EPCAM and GREM1 (deletion/duplication only).     ALLERGIES:  has no known allergies.  MEDICATIONS:  Current Outpatient Medications  Medication Sig Dispense Refill   abiraterone  acetate (ZYTIGA ) 250 MG tablet Take 4 tablets (1,000 mg total) by mouth daily. Take on an empty stomach 1 hour before or 2 hours after a meal 120 tablet 2   acetaminophen  (TYLENOL ) 500 MG tablet Take 500 mg by mouth every 6 (six) hours as needed.     amiodarone  (PACERONE ) 200 MG tablet Take 200 mg by mouth daily.      amLODipine (NORVASC) 10 MG tablet Take 1 tablet by mouth daily.     atorvastatin (LIPITOR) 40 MG tablet Take 40 mg by mouth daily.     gabapentin  (NEURONTIN ) 300 MG capsule Take 1 capsule (300 mg total) by mouth 3 (three) times daily. (Patient not taking: Reported on 02/11/2024) 90 capsule 1   lisinopril (ZESTRIL) 40 MG tablet Take 40 mg by mouth daily.     metoprolol  tartrate (LOPRESSOR ) 25 MG tablet Take 25 mg by mouth 2 (two) times daily. Am and pm     mirtazapine  (REMERON ) 7.5 MG tablet Take 1 tablet (7.5 mg total) by mouth at bedtime. 30 tablet 0   Oxycodone  HCl 10 MG TABS Take 1 tablet (10 mg total) by mouth every 4 (four) hours as needed (pain). (Patient not taking: Reported on 02/11/2024) 60 tablet  0   predniSONE  (DELTASONE ) 5 MG tablet Take 1 tablet (5 mg total) by mouth daily with breakfast. 30 tablet 2   rivaroxaban (XARELTO) 20 MG TABS tablet Take 20 mg by mouth daily with supper.     No current facility-administered medications for this visit.   Facility-Administered Medications Ordered in Other Visits  Medication Dose Route Frequency Provider Last Rate Last Admin   heparin  lock flush 100 unit/mL  500 Units Intracatheter PRN Jacobo Evalene PARAS, MD       sodium chloride  0.9 % injection 10 mL  10 mL Intracatheter PRN Finnegan, Timothy J, MD   10 mL at 10/19/14 1353   sodium chloride  flush (NS) 0.9 % injection 10 mL  10 mL Intracatheter PRN Jacobo Evalene PARAS, MD       sodium chloride  flush (NS) 0.9 % injection 10 mL  10 mL Intravenous PRN Finnegan, Timothy J, MD   10 mL at 08/13/17 1442    VITAL SIGNS: There were no vitals taken for this visit. There were no vitals filed for this visit.  Estimated body mass index is 24.67 kg/m as calculated from the following:   Height as of an earlier encounter on 02/11/24: 6' 1 (1.854 m).   Weight as of an earlier encounter on 02/11/24: 84.8 kg (187 lb).  LABS: CBC:    Component Value Date/Time   WBC 5.9 02/11/2024 1001   HGB 9.2 (L) 02/11/2024 1001   HGB 13.4 05/12/2014 0856   HCT 29.6 (L) 02/11/2024 1001   HCT 41.0 05/12/2014 0856   PLT 388 02/11/2024 1001   PLT 148 (L) 05/12/2014 0856   MCV 85.8 02/11/2024 1001   MCV 86 05/12/2014 0856   NEUTROABS 4.1 02/11/2024 1001   NEUTROABS 4.2 05/12/2014 0856   LYMPHSABS 1.3 02/11/2024 1001   LYMPHSABS 1.8 05/12/2014 0856   MONOABS 0.3 02/11/2024 1001   MONOABS 0.5 05/12/2014 0856   EOSABS 0.1 02/11/2024 1001   EOSABS 0.1 05/12/2014 0856   BASOSABS 0.0 02/11/2024 1001   BASOSABS 0.1 05/12/2014 0856   Comprehensive Metabolic Panel:    Component Value Date/Time   NA 140 02/11/2024 1001   NA 139 05/12/2014 0856   K 4.7 02/11/2024 1001   K 3.3 (L) 05/12/2014 0856   CL 107 02/11/2024 1001   CL 105 05/12/2014 0856   CO2 22 02/11/2024 1001   CO2 26 05/12/2014 0856   BUN 22 02/11/2024 1001   BUN 18 05/12/2014 0856   CREATININE 1.38 (H) 02/11/2024 1001   CREATININE 1.58 (H) 05/12/2014 0856   GLUCOSE 111 (H) 02/11/2024 1001   GLUCOSE 143 (H) 05/12/2014 0856   CALCIUM  9.7 02/11/2024 1001   CALCIUM  9.2 05/12/2014 0856   AST 77 (H) 02/11/2024 1001   ALT 36 02/11/2024 1001   ALT 24 05/12/2014 0856   ALKPHOS 206 (H) 02/11/2024 1001    ALKPHOS 94 05/12/2014 0856   BILITOT 0.3 02/11/2024 1001   PROT 7.1 02/11/2024 1001   PROT 7.2 05/12/2014 0856   ALBUMIN 3.4 (L) 02/11/2024 1001   ALBUMIN 4.1 05/12/2014 0856     Present during today's visit: patient and his partner  Assessment and Plan: CBC/CMP reviewed, continue abiraterone  1000mg  daily Hgb slightly decreased, will continue to monitor PSA today pending, but at last office was increased which triggered patient to be sent for repeat PET On 01/30/2024 showed mixed response to therapy, with an increase in skeletal metastases.  MD would like to continue abiraterone  for now and continue to  monitor Patient reports pain in areas corresponding to skeletal metastases.  MD plans to send patient to radiation oncology for evaluation.  Patient being seen by palliative care NP Josh Patient to receive Zometa  and ADT inj today Patient continues to express concern about weight loss, will continue to monitor.  On 12/13/2023 patient weighed 197 pounds, at today's visit 02/11/2024 patient weighs 187 pounds   Oral Chemotherapy Adherence: No missed doses reported No patient barriers to medication adherence identified.   New medications: None reported  Medication Access Issues: No issue, patient fills at [facility]  Patient expressed understanding and was in agreement with this plan. He also understands that He can call clinic at any time with any questions, concerns, or complaints.   Follow-up plan: RTC in 4 weeks  Thank you for allowing me to participate in the care of this very pleasant patient.   Time Total: 15 mins  Visit consisted of counseling and education on dealing with issues of symptom management in the setting of serious and potentially life-threatening illness.Greater than 50%  of this time was spent counseling and coordinating care related to the above assessment and plan.  Signed by: Lauralie Blacksher N. Shawntae Lowy, PharmD, NEILA, CPP Hematology/Oncology  Clinical Pharmacist Practitioner [place] [facility] [phone]  02/11/2024 1:21 PM  "

## 2024-02-11 NOTE — Progress Notes (Signed)
 "    Palliative Medicine [facility] [phone] Fax:([phone]) [phone]   Name: James Keller Date: 02/11/2024 MRN: [id number]  DOB: 23-Sep-1950  Patient Care Team: Center, [facility] as PCP - General Jinny Carmine, MD as Consulting Physician (Gastroenterology) Jacobo Evalene JINNY, MD as Consulting Physician (Oncology)    REASON FOR CONSULTATION: James Keller is a 74 y.o. 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  Patient has had pain and was referred to palliative care to address goals of manage ongoing symptoms.  SOCIAL HISTORY:     reports that he has been smoking cigarettes. He has a 27 pack-year smoking history. He has never used smokeless tobacco. He reports current alcohol use of about 6.0 standard drinks of alcohol per week. He reports current drug use. Drug: Marijuana.  Patient is married lives at home with his wife.  They have 1 son who is currently incarcerated.  Patient previously worked doing tree care.  ADVANCE DIRECTIVES:    CODE STATUS:   PAST MEDICAL HISTORY: Past Medical History:  Diagnosis Date   Colon cancer (HCC)    a. 11/2013 s/p colon resection/Hartmann's procedure;  b. Chemo: FOLFOX completed 07/2014.   DVT (deep venous thrombosis) (HCC)    RIGHT LEG/ HX OF   DVT (deep venous thrombosis) (HCC)    Dysrhythmia    Hypertension    Lower extremity edema    Rt leg   Neuromuscular disorder (HCC)    TINGLING IN FINGERS and feet   PAF (paroxysmal atrial fibrillation) (HCC)    S/P chemotherapy, time since less than 4 weeks    Tobacco abuse     PAST SURGICAL HISTORY:  Past Surgical History:  Procedure Laterality Date   COLON RESECTION  12/19/2013   colostomy   COLON SURGERY     COLONOSCOPY WITH PROPOFOL  N/A 09/14/2014   Procedure: COLONOSCOPY WITH PROPOFOL  THROUGH COLOSTOMY AND COLONOSCOPY THRU RECTUM;  Surgeon: Carmine Jinny, MD;  Location: [facility];  Service: Endoscopy;  Laterality: N/A;  TRANSVERSE COLON POLYP AND SIGMOID COLON POLYP   COLONOSCOPY WITH PROPOFOL  N/A 11/29/2015   Procedure: COLONOSCOPY WITH PROPOFOL ;  Surgeon: Ruel Kung, MD;  Location: [facility] ENDOSCOPY;  Service: Endoscopy;  Laterality: N/A;   COLONOSCOPY WITH PROPOFOL  N/A 01/21/2019   Procedure: COLONOSCOPY WITH PROPOFOL ;  Surgeon: Jinny Carmine, MD;  Location: [facility] ENDOSCOPY;  Service: Endoscopy;  Laterality: N/A;   COLOSTOMY REVERSAL N/A 10/28/2014   Procedure: COLOSTOMY REVERSAL;  Surgeon: Lonni Brands, MD;  Location: [facility] ORS;  Service: General;  Laterality: N/A;   ESOPHAGOGASTRODUODENOSCOPY (EGD) WITH PROPOFOL  N/A 01/21/2019   Procedure: ESOPHAGOGASTRODUODENOSCOPY (EGD) WITH PROPOFOL ;  Surgeon: Jinny Carmine, MD;  Location: [facility] ENDOSCOPY;  Service: Endoscopy;  Laterality: N/A;   FEMUR FRACTURE SURGERY Right    FRACTURE SURGERY     LEG SURGERY     LYSIS OF ADHESION  10/28/2014   Procedure: LYSIS OF ADHESION;  Surgeon: Lonni Brands, MD;  Location: [facility] ORS;  Service: General;;   PORT A CATH INJECTION ([facility] HX) Right    TUNNELED VENOUS PORT PLACEMENT      HEMATOLOGY/ONCOLOGY HISTORY:  Oncology History  Prostate cancer metastatic to bone (HCC)  08/30/2023 Initial Diagnosis   Prostate cancer metastatic to bone (HCC)   08/30/2023 Cancer Staging   Staging form: Prostate, AJCC 8th Edition - Clinical stage from 08/30/2023: Stage IVB (cTX, cN1, cM1b, PSA: 930) - Signed by Jacobo Evalene JINNY, MD on 08/30/2023  Stage prefix: Initial diagnosis Prostate specific antigen (PSA) range: 20 or greater    Genetic Testing   Negative genetic testing. No pathogenic variants identified on the [employer] Cancer[employer]+RNA panel. The report date is 09/22/2023.  The [employer] Cancer[employer]+RNAinsight Panel includes sequencing, rearrangement analysis, and RNA analysis for the following 40 genes: APC, ATM, BAP1, BARD1, BMPR1A, BRCA1, BRCA2, BRIP1, CDH1,  CDKN2A, CHEK2, FH, FLCN, MET, MLH1, MSH2, MSH6, MUTYH, NF1, NTHL1, PALB2, PMS2, PTEN, RAD51C, RAD51D, RPS20, SMAD4, STK11, TP53, TSC1, TSC2, and VHL (sequencing and deletion/duplication); AXIN2, HOXB13, MBD4, MSH3, POLD1 and POLE (sequencing only); EPCAM and GREM1 (deletion/duplication only).     ALLERGIES:  has no known allergies.  MEDICATIONS:  Current Outpatient Medications  Medication Sig Dispense Refill   abiraterone  acetate (ZYTIGA ) 250 MG tablet Take 4 tablets (1,000 mg total) by mouth daily. Take on an empty stomach 1 hour before or 2 hours after a meal 120 tablet 2   acetaminophen  (TYLENOL ) 500 MG tablet Take 500 mg by mouth every 6 (six) hours as needed.     amiodarone  (PACERONE ) 200 MG tablet Take 200 mg by mouth daily.      amLODipine (NORVASC) 10 MG tablet Take 1 tablet by mouth daily.     atorvastatin (LIPITOR) 40 MG tablet Take 40 mg by mouth daily.     gabapentin  (NEURONTIN ) 300 MG capsule Take 1 capsule (300 mg total) by mouth 3 (three) times daily. (Patient not taking: Reported on 02/11/2024) 90 capsule 1   lisinopril (ZESTRIL) 40 MG tablet Take 40 mg by mouth daily.     metoprolol  tartrate (LOPRESSOR ) 25 MG tablet Take 25 mg by mouth 2 (two) times daily. Am and pm     mirtazapine  (REMERON ) 7.5 MG tablet Take 1 tablet (7.5 mg total) by mouth at bedtime. 30 tablet 0   Oxycodone  HCl 10 MG TABS Take 1 tablet (10 mg total) by mouth every 4 (four) hours as needed (pain). (Patient not taking: Reported on 02/11/2024) 60 tablet 0   predniSONE  (DELTASONE ) 5 MG tablet Take 1 tablet (5 mg total) by mouth daily with breakfast. 30 tablet 2   rivaroxaban (XARELTO) 20 MG TABS tablet Take 20 mg by mouth daily with supper.     No current facility-administered medications for this visit.   Facility-Administered Medications Ordered in Other Visits  Medication Dose Route Frequency Provider Last Rate Last Admin   heparin  lock flush 100 unit/mL  500 Units Intracatheter PRN Jacobo Evalene PARAS, MD        sodium chloride  0.9 % injection 10 mL  10 mL Intracatheter PRN Finnegan, Timothy J, MD   10 mL at 10/19/14 1353   sodium chloride  flush (NS) 0.9 % injection 10 mL  10 mL Intracatheter PRN Jacobo Evalene PARAS, MD       sodium chloride  flush (NS) 0.9 % injection 10 mL  10 mL Intravenous PRN Finnegan, Timothy J, MD   10 mL at 08/13/17 1442    VITAL SIGNS: There were no vitals taken for this visit. There were no vitals filed for this visit.  Estimated body mass index is 24.67 kg/m as calculated from the following:   Height as of an earlier encounter on 02/11/24: 6' 1 (1.854 m).   Weight as of an earlier encounter on 02/11/24: 187 lb (84.8 kg).  LABS: CBC:    Component Value Date/Time   WBC 5.9 02/11/2024 1001   HGB 9.2 (L) 02/11/2024 1001   HGB 13.4 05/12/2014 0856   HCT 29.6 (L) 02/11/2024  1001   HCT 41.0 05/12/2014 0856   PLT 388 02/11/2024 1001   PLT 148 (L) 05/12/2014 0856   MCV 85.8 02/11/2024 1001   MCV 86 05/12/2014 0856   NEUTROABS 4.1 02/11/2024 1001   NEUTROABS 4.2 05/12/2014 0856   LYMPHSABS 1.3 02/11/2024 1001   LYMPHSABS 1.8 05/12/2014 0856   MONOABS 0.3 02/11/2024 1001   MONOABS 0.5 05/12/2014 0856   EOSABS 0.1 02/11/2024 1001   EOSABS 0.1 05/12/2014 0856   BASOSABS 0.0 02/11/2024 1001   BASOSABS 0.1 05/12/2014 0856   Comprehensive Metabolic Panel:    Component Value Date/Time   NA 140 02/11/2024 1001   NA 139 05/12/2014 0856   K 4.7 02/11/2024 1001   K 3.3 (L) 05/12/2014 0856   CL 107 02/11/2024 1001   CL 105 05/12/2014 0856   CO2 22 02/11/2024 1001   CO2 26 05/12/2014 0856   BUN 22 02/11/2024 1001   BUN 18 05/12/2014 0856   CREATININE 1.38 (H) 02/11/2024 1001   CREATININE 1.58 (H) 05/12/2014 0856   GLUCOSE 111 (H) 02/11/2024 1001   GLUCOSE 143 (H) 05/12/2014 0856   CALCIUM  9.7 02/11/2024 1001   CALCIUM  9.2 05/12/2014 0856   AST 77 (H) 02/11/2024 1001   ALT 36 02/11/2024 1001   ALT 24 05/12/2014 0856   ALKPHOS 206 (H) 02/11/2024 1001    ALKPHOS 94 05/12/2014 0856   BILITOT 0.3 02/11/2024 1001   PROT 7.1 02/11/2024 1001   PROT 7.2 05/12/2014 0856   ALBUMIN 3.4 (L) 02/11/2024 1001   ALBUMIN 4.1 05/12/2014 0856    RADIOGRAPHIC STUDIES: NM PET (PSMA) SKULL TO MID THIGH Result Date: 02/07/2024 EXAM: PROSTATE PET SKULL BASE TO MID THIGHS 01/30/2024 02:17:43 PM TECHNIQUE: RADIOPHARMACEUTICAL: 8.74 mCi F-18 flotufolastaf (Posluma ) injected intravenously. PET imaging was obtained from skull vertex to mid thighs. Computed tomography was used for attenuation correction and localization. Fusion imaging was obtained. COMPARISON: PSMA PET scan dated 08/15/2023. CLINICAL HISTORY: Prostate cancer, residual or recurrent disease suspected; prostate cancer, on active treatment with rising PSA. FINDINGS: PROSTATE AND PROSTATE BED: No focal PSMA activity. LYMPH NODES: Reduction in number and activity of PSMA avid pelvic lymph nodes. For example, a right obturator node measuring 11 mm on image 130, decreased in size from 20 mm and with SUV max equal 14, decreased from SUV max equal 35. A left common iliac node measuring 6 mm on image 117, decreased from 15 mm and with SUV max equal 11.3, decreased from SUV max equal 44. Several of the PSMA avid lymph nodes seen on the comparison exam have resolved completely. No new adenopathy. 2 small foci of PSMA activity localize to prevascular nodes on image 52 with SUV max equal 6.2, compared to SUV max equal 6.0 on prior, for no interval change. No new mediastinal PSMA avid lymph nodes. BONES: Marked increase in PSMA avid skeletal metastasis with multiple new lesions and increased activity of previously described lesions. Innumerable PSMA avid skeletal metastases involving the proximal femurs, pelvis, entire spine, ribs, and shoulder girdles. The activity of the bones is increased from prior. For example, activity in the right scapula with SUV max equal 15.7, increased from SUV max equal 10. Activity in the L4 vertebral  body with SUV max equal 26, increased from SUV max equal 11. On current exam, every vertebral body is involved with metastatic disease, whereas on the comparison exam, scattered activity throughout the spine was noted. Example lesion at L1 with SUV max equal 30, increased from SUV max equal  19 on prior. Increased volume of PSMA tissue within the right femoral neck with SUV max of 23, compared to a small lesion on prior with SUV max of 10. Similar findings in the left femoral neck. The near entirety of the sacrum is involved now with SUV max equal 29, compared to scattered activity on prior with SUV max equal 15. OTHER FINDINGS: Physiologic activity within the salivary glands, liver, spleen, kidneys, bowel, and urinary bladder. Mixed response therapy. IMPRESSION: 1. Mixed response to therapy. 2. Marked increase in PSMA-avid skeletal metastases with multiple new lesions and increased activity of previously described lesions 3. Decrease in size and PSMA activity of iliac pelvic lymph nodes with no new adenopathy. Electronically signed by: Norleen Boxer MD 02/07/2024 11:23 AM EST RP Workstation: [id number]   DG HIPS BILAT WITH PELVIS MIN 5 VIEWS Result Date: 01/29/2024 EXAM: 5 VIEW(S) XRAY OF THE BILATERAL HIP 01/29/2024 12:56:00 AM COMPARISON: None available. CLINICAL HISTORY: [id number] Fall [id number] FINDINGS: BONES AND JOINTS: Extensive sclerotic lesions throughout the bony pelvis and proximal femurs compatible with osseous metastatic disease. Intramedullary nail in the right femur across an old healed mid to distal femoral shaft fracture. No malalignment. SOFT TISSUES: The soft tissues are unremarkable. IMPRESSION: 1. Extensive sclerotic lesions throughout the bony pelvis and proximal femurs, compatible with osseous metastatic disease. 2. Intramedullary nail in the right femur across a healed mid to distal femoral shaft fracture. Electronically signed by: Franky Crease MD 01/29/2024 01:06 AM EST RP Workstation: [id number]    DG Knee Complete 4 Views Right Result Date: 01/29/2024 EXAM: 4 OR MORE VIEW(S) XRAY OF THE RIGHT KNEE 01/29/2024 12:56:00 AM COMPARISON: None available. CLINICAL HISTORY: fall FINDINGS: BONES AND JOINTS: Right femoral intramedullary nail partially visualized in the distal right femur across a partially visualized healed distal femoral fracture. Early spurring in the medial and patellofemoral compartments. No malalignment. No subluxation or dislocation. Small joint effusion. SOFT TISSUES: Vascular calcifications. IMPRESSION: 1. No acute fracture or dislocation. 2. Small joint effusion. Electronically signed by: Franky Crease MD 01/29/2024 01:05 AM EST RP Workstation: [id number]   CT HEAD WO CONTRAST ( ) Result Date: 01/29/2024 EXAM: CT HEAD WITHOUT 01/29/2024 12:34:52 AM TECHNIQUE: CT of the head was performed without the administration of intravenous contrast. Automated exposure control, iterative reconstruction, and/or weight based adjustment of the mA/kV was utilized to reduce the radiation dose to as low as reasonably achievable. COMPARISON: 01/06/2020 CLINICAL HISTORY: Head trauma, minor (Age >= 65y) FINDINGS: BRAIN AND VENTRICLES: No acute intracranial hemorrhage. No mass effect or midline shift. No extra-axial fluid collection. No evidence of acute infarct. No hydrocephalus. Atrophy and chronic small vessel disease throughout the deep white matter. ORBITS: No acute abnormality. SINUSES AND MASTOIDS: No acute abnormality. SOFT TISSUES AND SKULL: No acute skull fracture. No acute soft tissue abnormality. IMPRESSION: 1. No acute intracranial abnormality. Electronically signed by: Franky Crease MD 01/29/2024 12:43 AM EST RP Workstation: [id number]   CT Angio Chest PE W and/or Wo Contrast Result Date: 01/29/2024 EXAM: CTA CHEST 01/29/2024 12:34:52 AM TECHNIQUE: CTA of the chest was performed without and with the administration of 100 mL of iohexol  (OMNIPAQUE ) 350 MG/ML injection. Multiplanar reformatted  images are provided for review. MIP images are provided for review. Automated exposure control, iterative reconstruction, and/or weight based adjustment of the mA/kV was utilized to reduce the radiation dose to as low as reasonably achievable. COMPARISON: 08/02/2023 CLINICAL HISTORY: sob FINDINGS: PULMONARY ARTERIES: Pulmonary arteries are adequately opacified for evaluation. No acute pulmonary embolus. Main pulmonary artery is normal  in caliber. MEDIASTINUM: Coronary artery atherosclerosis. Aortic atherosclerosis. The pericardium demonstrates no acute abnormality. There is no acute abnormality of the thoracic aorta. LYMPH NODES: No mediastinal, hilar or axillary lymphadenopathy. LUNGS AND PLEURA: Dependent atelectasis. Previously seen posterior right lower lobe pulmonary nodule not definitively seen on today's study, likely obscured by the dependent atelectasis. No evidence of pleural effusion or pneumothorax. UPPER ABDOMEN: Limited images of the upper abdomen are unremarkable. SOFT TISSUES AND BONES: Extensive diffuse sclerotic lesions throughout the osseous structures, markedly progressed since the prior study, are compatible with worsening osseous metastatic disease. IMPRESSION: 1. No pulmonary embolism. 2. No acute cardiopulmonary disease. 3. Aortic atherosclerosis, coronary artery disease. 4. Extensive diffuse sclerotic lesions throughout the osseous structures, markedly progressed since the prior study, compatible with worsening osseous metastatic disease. Electronically signed by: Franky Crease MD 01/29/2024 12:42 AM EST RP Workstation: [id number]   DG Chest 2 View Result Date: 01/28/2024 EXAM: 2 VIEW(S) XRAY OF THE CHEST 01/28/2024 08:07:56 PM COMPARISON: CT chest abdomen and pelvis dated 08/02/2023. CLINICAL HISTORY: left sided chest pain and back pain FINDINGS: LUNGS AND PLEURA: Low lung volume. Linear atelectasis at left lung base. Elevated left hemidiaphragm. No pleural effusion. No pneumothorax. HEART  AND MEDIASTINUM: Ectatic thoracic aorta. No acute abnormality of the cardiac silhouette. BONES AND SOFT TISSUES: Diffuse sclerotic lesions are again seen throughout the osseous structures, as noted on the CT chest abdomen and pelvis dated 08/02/2023. No acute osseous abnormality. IMPRESSION: 1. No acute cardiopulmonary abnormality. 2. Diffuse skeletal sclerosis, most consistent with osteoblastic metastatic disease. Electronically signed by: Greig Pique MD 01/28/2024 08:11 PM EST RP Workstation: [id number]    PERFORMANCE STATUS (ECOG) : 2 - Symptomatic, <50% confined to bed  Review of Systems Unless otherwise noted, a complete review of systems is negative.  Physical Exam General: NAD Cardiovascular: regular rate and rhythm Pulmonary: clear ant fields Abdomen: soft, nontender, + bowel sounds GU: no suprapubic tenderness Extremities: no edema, no joint deformities Skin: no rashes Neurological: Weakness but otherwise nonfocal  IMPRESSION: Follow-up visit.  Patient accompanied by wife.  Patient reports he is doing about the same.  Pain is tolerable on 10 mg oxycodone .  Will continue that regimen for now.  Can consider initiation of a long-acting opioid if needed.  PET scan showed mixed response to treatment.  Discussed with Dr. Jacobo and the patient being referred for consideration of XRT.  PLAN: - Continue current scope of treatment - Continue oxycodone  - Daily bowel regimen of MiraLAX /senna - Continue mirtazapine  7.5 mg nightly - RTC 3 to 4 weeks   Patient expressed understanding and was in agreement with this plan. He also understands that He can call the clinic at any time with any questions, concerns, or complaints.     Time Total: 15 minutes  Visit consisted of counseling and education dealing with the complex and emotionally intense issues of symptom management and palliative care in the setting of serious and potentially life-threatening illness.Greater than 50%  of  this time was spent counseling and coordinating care related to the above assessment and plan.  Signed by: Fonda Mower, PhD, NP-C   "

## 2024-02-11 NOTE — Progress Notes (Signed)
 " [facility]  [phone] [phone]) [phone]  ID: Sandra JINNY Margo OB: Nov 11, 1950  MR#: [id number]  RDW#:[id number]  Patient Care Team: Center, [facility] as PCP - General Jinny Carmine, MD as Consulting Physician (Gastroenterology) Jacobo Evalene JINNY, MD as Consulting Physician (Oncology)  CHIEF COMPLAINT: Stage IVb adenocarcinoma the prostate.  Unknown Gleason score.  INTERVAL HISTORY: Patient returns to clinic today for further evaluation, discussion of his PET scan results, and consideration of Eligard  and Zometa .  He also continues with Zytiga  and prednisone .  He has increased pain, but otherwise feels well.  He also has increased fatigue.  He is tolerating his treatments without significant side effects.  He has no neurologic complaints.  He denies any recent fevers or illnesses.  He has a good appetite and denies weight loss.  He denies any chest pain, shortness of breath, cough, or hemoptysis.  He denies any nausea, vomiting, constipation, or diarrhea.  He denies any melena or hematochezia.  He has no urinary complaints.  Patient offers no further specific complaints today.  REVIEW OF SYSTEMS:   Review of Systems  Constitutional:  Positive for malaise/fatigue. Negative for fever and weight loss.  Respiratory: Negative.  Negative for cough, hemoptysis and shortness of breath.   Cardiovascular: Negative.  Negative for chest pain and leg swelling.  Gastrointestinal: Negative.  Negative for abdominal pain, blood in stool, constipation and melena.  Genitourinary: Negative.  Negative for frequency, hematuria and urgency.  Musculoskeletal:  Positive for back pain.  Skin: Negative.  Negative for rash.  Neurological: Negative.  Negative for dizziness, sensory change, focal weakness, weakness and headaches.  Psychiatric/Behavioral: Negative.  The patient is not nervous/anxious.     As per HPI. Otherwise, a complete review of systems is  negative.  PAST MEDICAL HISTORY: Past Medical History:  Diagnosis Date   Colon cancer (HCC)    a. 11/2013 s/p colon resection/Hartmann's procedure;  b. Chemo: FOLFOX completed 07/2014.   DVT (deep venous thrombosis) (HCC)    RIGHT LEG/ HX OF   DVT (deep venous thrombosis) (HCC)    Dysrhythmia    Hypertension    Lower extremity edema    Rt leg   Neuromuscular disorder (HCC)    TINGLING IN FINGERS and feet   PAF (paroxysmal atrial fibrillation) (HCC)    S/P chemotherapy, time since less than 4 weeks    Tobacco abuse     PAST SURGICAL HISTORY: Past Surgical History:  Procedure Laterality Date   COLON RESECTION  12/19/2013   colostomy   COLON SURGERY     COLONOSCOPY WITH PROPOFOL  N/A 09/14/2014   Procedure: COLONOSCOPY WITH PROPOFOL  THROUGH COLOSTOMY AND COLONOSCOPY THRU RECTUM;  Surgeon: Carmine Jinny, MD;  Location: [facility];  Service: Endoscopy;  Laterality: N/A;  TRANSVERSE COLON POLYP AND SIGMOID COLON POLYP   COLONOSCOPY WITH PROPOFOL  N/A 11/29/2015   Procedure: COLONOSCOPY WITH PROPOFOL ;  Surgeon: Ruel Kung, MD;  Location: [facility] END[facility];  Service: Endoscopy;  Laterality: N/A;   COLONOSCOPY WITH PROPOFOL  N/A 01/21/2019   Procedure: COLONOSCOPY WITH PROPOFOL ;  Surgeon: Jinny Carmine, MD;  Location: [facility];  Service: Endoscopy;  Laterality: N/A;   COLOSTOMY REVERSAL N/A 10/28/2014   Procedure: COLOSTOMY REVERSAL;  Surgeon: Lonni Brands, MD;  Location: [facility] ORS;  Service: General;  Laterality: N/A;   ESOPHAGOGASTRODUODENOSCOPY (EGD) WITH PROPOFOL  N/A 01/21/2019   Procedure: ESOPHAGOGASTRODUODENOSCOPY (EGD) WITH PROPOFOL ;  Surgeon: Jinny Carmine, MD;  Location: [facility] END[facility]COPY;  Service: Endoscopy;  Laterality: N/A;   FEMUR FRACTURE SURGERY  Right    FRACTURE SURGERY     LEG SURGERY     LYSIS OF ADHESION  10/28/2014   Procedure: LYSIS OF ADHESION;  Surgeon: Lonni Brands, MD;  Location: [facility] ORS;  Service: General;;   PORT A CATH INJECTION ([facility] HX)  Right    TUNNELED VENOUS PORT PLACEMENT      FAMILY HISTORY: Grandmother with liver cancer.     ADVANCED DIRECTIVES:    HEALTH MAINTENANCE: Social History   Tobacco Use   Smoking status: Every Day    Current packs/day: 0.50    Average packs/day: 0.5 packs/day for 54.0 years (27.0 ttl pk-yrs)    Types: Cigarettes   Smokeless tobacco: Never  Vaping Use   Vaping status: Never Used  Substance Use Topics   Alcohol use: Yes    Alcohol/week: 6.0 standard drinks of alcohol    Types: 6 Cans of beer per week    Comment: moderate beer   Drug use: Yes    Types: Marijuana    Comment: occasional     Colonoscopy:  PAP:  Bone density:  Lipid panel:  No Known Allergies   Current Outpatient Medications  Medication Sig Dispense Refill   abiraterone  acetate (ZYTIGA ) 250 MG tablet Take 4 tablets (1,000 mg total) by mouth daily. Take on an empty stomach 1 hour before or 2 hours after a meal 120 tablet 2   acetaminophen  (TYLENOL ) 500 MG tablet Take 500 mg by mouth every 6 (six) hours as needed.     amiodarone  (PACERONE ) 200 MG tablet Take 200 mg by mouth daily.      amLODipine (NORVASC) 10 MG tablet Take 1 tablet by mouth daily.     atorvastatin (LIPITOR) 40 MG tablet Take 40 mg by mouth daily.     lisinopril (ZESTRIL) 40 MG tablet Take 40 mg by mouth daily.     metoprolol  tartrate (LOPRESSOR ) 25 MG tablet Take 25 mg by mouth 2 (two) times daily. Am and pm     mirtazapine  (REMERON ) 7.5 MG tablet Take 1 tablet (7.5 mg total) by mouth at bedtime. 30 tablet 0   predniSONE  (DELTASONE ) 5 MG tablet Take 1 tablet (5 mg total) by mouth daily with breakfast. 30 tablet 2   rivaroxaban (XARELTO) 20 MG TABS tablet Take 20 mg by mouth daily with supper.     gabapentin  (NEURONTIN ) 300 MG capsule Take 1 capsule (300 mg total) by mouth 3 (three) times daily. (Patient not taking: Reported on 02/11/2024) 90 capsule 1   Oxycodone  HCl 10 MG TABS Take 1 tablet (10 mg total) by mouth every 4 (four) hours as  needed (pain). (Patient not taking: Reported on 02/11/2024) 60 tablet 0   No current facility-administered medications for this visit.   Facility-Administered Medications Ordered in Other Visits  Medication Dose Route Frequency Provider Last Rate Last Admin   heparin  lock flush 100 unit/mL  500 Units Intracatheter PRN Jacobo Evalene PARAS, MD       sodium chloride  0.9 % injection 10 mL  10 mL Intracatheter PRN Marvens Hollars J, MD   10 mL at 10/19/14 1353   sodium chloride  flush (NS) 0.9 % injection 10 mL  10 mL Intracatheter PRN Jacobo Evalene PARAS, MD       sodium chloride  flush (NS) 0.9 % injection 10 mL  10 mL Intravenous PRN Tiphany Fayson J, MD   10 mL at 08/13/17 1442    OBJECTIVE: Vitals:   02/11/24 1017  BP: 115/68  Pulse: 92  Resp:  18  Temp: 97.8 F (36.6 C)  SpO2: 99%       Body mass index is 24.67 kg/m.    ECOG FS:1 - Symptomatic but completely ambulatory  General: Well-developed, well-nourished, no acute distress.  Sitting in a wheelchair. Eyes: Pink conjunctiva, anicteric sclera. HEENT: Normocephalic, moist mucous membranes. Lungs: No audible wheezing or coughing. Heart: Regular rate and rhythm. Abdomen: Soft, nontender, no obvious distention. Musculoskeletal: No edema, cyanosis, or clubbing. Neuro: Alert, answering all questions appropriately. Cranial nerves grossly intact. Skin: No rashes or petechiae noted. Psych: Normal affect.  LAB RESULTS:  Lab Results  Component Value Date   NA 140 02/11/2024   K 4.7 02/11/2024   CL 107 02/11/2024   CO2 22 02/11/2024   GLUCOSE 111 (H) 02/11/2024   BUN 22 02/11/2024   CREATININE 1.38 (H) 02/11/2024   CALCIUM  9.7 02/11/2024   PROT 7.1 02/11/2024   ALBUMIN 3.4 (L) 02/11/2024   AST 77 (H) 02/11/2024   ALT 36 02/11/2024   ALKPHOS 206 (H) 02/11/2024   BILITOT 0.3 02/11/2024   GFRNONAA 54 (L) 02/11/2024   GFRAA 45 (L) 05/29/2019    Lab Results  Component Value Date   WBC 5.9 02/11/2024   NEUTROABS 4.1  02/11/2024   HGB 9.2 (L) 02/11/2024   HCT 29.6 (L) 02/11/2024   MCV 85.8 02/11/2024   PLT 388 02/11/2024    STUDIES:    NM PET (PSMA) SKULL TO MID THIGH Result Date: 02/07/2024 EXAM: PROSTATE PET SKULL BASE TO MID THIGHS 01/30/2024 02:17:43 PM TECHNIQUE: RADIOPHARMACEUTICAL: 8.74 mCi F-18 flotufolastaf (Posluma ) injected intravenously. PET imaging was obtained from skull vertex to mid thighs. Computed tomography was used for attenuation correction and localization. Fusion imaging was obtained. COMPARISON: PSMA PET scan dated 08/15/2023. CLINICAL HISTORY: Prostate cancer, residual or recurrent disease suspected; prostate cancer, on active treatment with rising PSA. FINDINGS: PROSTATE AND PROSTATE BED: No focal PSMA activity. LYMPH NODES: Reduction in number and activity of PSMA avid pelvic lymph nodes. For example, a right obturator node measuring 11 mm on image 130, decreased in size from 20 mm and with SUV max equal 14, decreased from SUV max equal 35. A left common iliac node measuring 6 mm on image 117, decreased from 15 mm and with SUV max equal 11.3, decreased from SUV max equal 44. Several of the PSMA avid lymph nodes seen on the comparison exam have resolved completely. No new adenopathy. 2 small foci of PSMA activity localize to prevascular nodes on image 52 with SUV max equal 6.2, compared to SUV max equal 6.0 on prior, for no interval change. No new mediastinal PSMA avid lymph nodes. BONES: Marked increase in PSMA avid skeletal metastasis with multiple new lesions and increased activity of previously described lesions. Innumerable PSMA avid skeletal metastases involving the proximal femurs, pelvis, entire spine, ribs, and shoulder girdles. The activity of the bones is increased from prior. For example, activity in the right scapula with SUV max equal 15.7, increased from SUV max equal 10. Activity in the L4 vertebral body with SUV max equal 26, increased from SUV max equal 11. On current exam,  every vertebral body is involved with metastatic disease, whereas on the comparison exam, scattered activity throughout the spine was noted. Example lesion at L1 with SUV max equal 30, increased from SUV max equal 19 on prior. Increased volume of PSMA tissue within the right femoral neck with SUV max of 23, compared to a small lesion on prior with SUV max of 10. Similar  findings in the left femoral neck. The near entirety of the sacrum is involved now with SUV max equal 29, compared to scattered activity on prior with SUV max equal 15. OTHER FINDINGS: Physiologic activity within the salivary glands, liver, spleen, kidneys, bowel, and urinary bladder. Mixed response therapy. IMPRESSION: 1. Mixed response to therapy. 2. Marked increase in PSMA-avid skeletal metastases with multiple new lesions and increased activity of previously described lesions 3. Decrease in size and PSMA activity of iliac pelvic lymph nodes with no new adenopathy. Electronically signed by: Norleen Boxer MD 02/07/2024 11:23 AM EST RP Workstation: [id number]   DG HIPS BILAT WITH PELVIS MIN 5 VIEWS Result Date: 01/29/2024 EXAM: 5 VIEW(S) XRAY OF THE BILATERAL HIP 01/29/2024 12:56:00 AM COMPARISON: None available. CLINICAL HISTORY: [id number] Fall [id number] FINDINGS: BONES AND JOINTS: Extensive sclerotic lesions throughout the bony pelvis and proximal femurs compatible with osseous metastatic disease. Intramedullary nail in the right femur across an old healed mid to distal femoral shaft fracture. No malalignment. SOFT TISSUES: The soft tissues are unremarkable. IMPRESSION: 1. Extensive sclerotic lesions throughout the bony pelvis and proximal femurs, compatible with osseous metastatic disease. 2. Intramedullary nail in the right femur across a healed mid to distal femoral shaft fracture. Electronically signed by: Franky Crease MD 01/29/2024 01:06 AM EST RP Workstation: [id number]   DG Knee Complete 4 Views Right Result Date: 01/29/2024 EXAM: 4 OR MORE  VIEW(S) XRAY OF THE RIGHT KNEE 01/29/2024 12:56:00 AM COMPARISON: None available. CLINICAL HISTORY: fall FINDINGS: BONES AND JOINTS: Right femoral intramedullary nail partially visualized in the distal right femur across a partially visualized healed distal femoral fracture. Early spurring in the medial and patellofemoral compartments. No malalignment. No subluxation or dislocation. Small joint effusion. SOFT TISSUES: Vascular calcifications. IMPRESSION: 1. No acute fracture or dislocation. 2. Small joint effusion. Electronically signed by: Franky Crease MD 01/29/2024 01:05 AM EST RP Workstation: [id number]   CT HEAD WO CONTRAST ( ) Result Date: 01/29/2024 EXAM: CT HEAD WITHOUT 01/29/2024 12:34:52 AM TECHNIQUE: CT of the head was performed without the administration of intravenous contrast. Automated exposure control, iterative reconstruction, and/or weight based adjustment of the mA/kV was utilized to reduce the radiation dose to as low as reasonably achievable. COMPARISON: 01/06/2020 CLINICAL HISTORY: Head trauma, minor (Age >= 65y) FINDINGS: BRAIN AND VENTRICLES: No acute intracranial hemorrhage. No mass effect or midline shift. No extra-axial fluid collection. No evidence of acute infarct. No hydrocephalus. Atrophy and chronic small vessel disease throughout the deep white matter. ORBITS: No acute abnormality. SINUSES AND MASTOIDS: No acute abnormality. SOFT TISSUES AND SKULL: No acute skull fracture. No acute soft tissue abnormality. IMPRESSION: 1. No acute intracranial abnormality. Electronically signed by: Franky Crease MD 01/29/2024 12:43 AM EST RP Workstation: [id number]   CT Angio Chest PE W and/or Wo Contrast Result Date: 01/29/2024 EXAM: CTA CHEST 01/29/2024 12:34:52 AM TECHNIQUE: CTA of the chest was performed without and with the administration of 100 mL of iohexol  (OMNIPAQUE ) 350 MG/ML injection. Multiplanar reformatted images are provided for review. MIP images are provided for review. Automated  exposure control, iterative reconstruction, and/or weight based adjustment of the mA/kV was utilized to reduce the radiation dose to as low as reasonably achievable. COMPARISON: 08/02/2023 CLINICAL HISTORY: sob FINDINGS: PULMONARY ARTERIES: Pulmonary arteries are adequately opacified for evaluation. No acute pulmonary embolus. Main pulmonary artery is normal in caliber. MEDIASTINUM: Coronary artery atherosclerosis. Aortic atherosclerosis. The pericardium demonstrates no acute abnormality. There is no acute abnormality of the thoracic aorta. LYMPH NODES: No mediastinal, hilar or axillary lymphadenopathy.  LUNGS AND PLEURA: Dependent atelectasis. Previously seen posterior right lower lobe pulmonary nodule not definitively seen on today's study, likely obscured by the dependent atelectasis. No evidence of pleural effusion or pneumothorax. UPPER ABDOMEN: Limited images of the upper abdomen are unremarkable. SOFT TISSUES AND BONES: Extensive diffuse sclerotic lesions throughout the osseous structures, markedly progressed since the prior study, are compatible with worsening osseous metastatic disease. IMPRESSION: 1. No pulmonary embolism. 2. No acute cardiopulmonary disease. 3. Aortic atherosclerosis, coronary artery disease. 4. Extensive diffuse sclerotic lesions throughout the osseous structures, markedly progressed since the prior study, compatible with worsening osseous metastatic disease. Electronically signed by: Franky Crease MD 01/29/2024 12:42 AM EST RP Workstation: [id number]   DG Chest 2 View Result Date: 01/28/2024 EXAM: 2 VIEW(S) XRAY OF THE CHEST 01/28/2024 08:07:56 PM COMPARISON: CT chest abdomen and pelvis dated 08/02/2023. CLINICAL HISTORY: left sided chest pain and back pain FINDINGS: LUNGS AND PLEURA: Low lung volume. Linear atelectasis at left lung base. Elevated left hemidiaphragm. No pleural effusion. No pneumothorax. HEART AND MEDIASTINUM: Ectatic thoracic aorta. No acute abnormality of the cardiac  silhouette. BONES AND SOFT TISSUES: Diffuse sclerotic lesions are again seen throughout the osseous structures, as noted on the CT chest abdomen and pelvis dated 08/02/2023. No acute osseous abnormality. IMPRESSION: 1. No acute cardiopulmonary abnormality. 2. Diffuse skeletal sclerosis, most consistent with osteoblastic metastatic disease. Electronically signed by: Greig Pique MD 01/28/2024 08:11 PM EST RP Workstation: [id number]     ASSESSMENT: Stage IVb adenocarcinoma the prostate.  Unknown Gleason score.  PLAN:    Stage IVb adenocarcinoma the prostate, unknown Gleason score: Patient underwent biopsy of one of his bone lesions on September 17, 2023 confirming diagnosis.  His initial PSA was greater than 1500.  His PSA trended down to 64.64, but now has trended up slightly to 157.  Today's result is pending.  Patient states he has not missed any doses of Zytiga .  PSMA PET scan on January 28, 2024 revealed mixed response to therapy.  Patient initiated Zytiga  in August 2025.  Will continue Zytiga  and Zometa  as prescribed for now.  Patient will transition to Eligard  45 mg today.  A referral has been sent to radiation oncology for consideration of XRT.  Return to clinic in 4 weeks for further evaluation and continuation of treatment.  Appreciate palliative care and clinical pharmacy input.   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  Previously, patient CEA was chronically elevated ranging from 4.6-8.6 since at least October 2018.  His most recent result is 8.6.  He had a colonoscopy and upper endoscopy on January 21, 2019 that did not reveal any significant pathology or evidence of recurrent disease.  No further interventions are needed.   H/o DVT: Patient has discontinued Coumadin  and is now on Xarelto.  Managed by patient's primary care. Chronic renal insufficiency: GFR is 54 today. Anemia: Globin has trended down to 9.2.   Previously, he reported dark tarry stools, but does not complain of this today.  Iron panel on October 18, 2023 was within normal limits.  Consider referral back to GI for colonoscopy.  Repeat iron stores with next blood draw. Neuropathy: Patient does not complain of this today.   Weight loss: Patient's weight continues to trend down.  Consider referral to dietary. Pain: Continue narcotics as prescribed.  Patient was also given a referral to radiation oncology.  Appreciate palliative care input.   Patient expressed understanding and  was in agreement with this plan. He also understands that He can call clinic at any time with any questions, concerns, or complaints.   Colon cancer   Staging form: Colon/Rectum, AJCC 7th Edition     Pathologic stage from 05/04/2014: Stage IIIB (T4, N1, M0) - Signed by Evalene JINNY Reusing, MD on 05/04/2014   Evalene JINNY Reusing, MD   02/11/2024 12:23 PM     "

## 2024-02-11 NOTE — Progress Notes (Signed)
 Patient is having some severe leg pain.

## 2024-02-13 ENCOUNTER — Inpatient Hospital Stay: Admitting: Occupational Therapy

## 2024-02-13 DIAGNOSIS — M6281 Muscle weakness (generalized): Secondary | ICD-10-CM

## 2024-02-13 NOTE — Therapy (Signed)
 [facility] - A Dept Of [place]. [facility] [address] [place], [place], [address] Phone: [phone]   Fax:  [phone]  Occupational Therapy Screen  Patient Details  Name: James Keller MRN: [id number] Date of Birth: 1951/01/13 No data recorded  Encounter Date: 02/13/2024   OT End of Session - 02/13/24 1537     Visit Number 0          Past Medical History:  Diagnosis Date   Colon cancer (HCC)    a. 11/2013 s/p colon resection/Hartmann's procedure;  b. Chemo: FOLFOX completed 07/2014.   DVT (deep venous thrombosis) (HCC)    RIGHT LEG/ HX OF   DVT (deep venous thrombosis) (HCC)    Dysrhythmia    Hypertension    Lower extremity edema    Rt leg   Neuromuscular disorder (HCC)    TINGLING IN FINGERS and feet   PAF (paroxysmal atrial fibrillation) (HCC)    S/P chemotherapy, time since less than 4 weeks    Tobacco abuse     Past Surgical History:  Procedure Laterality Date   COLON RESECTION  12/19/2013   colostomy   COLON SURGERY     COLONOSCOPY WITH PROPOFOL  N/A 09/14/2014   Procedure: COLONOSCOPY WITH PROPOFOL  THROUGH COLOSTOMY AND COLONOSCOPY THRU RECTUM;  Surgeon: Rogelia Copping, MD;  Location: [facility];  Service: Endoscopy;  Laterality: N/A;  TRANSVERSE COLON POLYP AND SIGMOID COLON POLYP   COLONOSCOPY WITH PROPOFOL  N/A 11/29/2015   Procedure: COLONOSCOPY WITH PROPOFOL ;  Surgeon: Ruel Kung, MD;  Location: [facility];  Service: Endoscopy;  Laterality: N/A;   COLONOSCOPY WITH PROPOFOL  N/A 01/21/2019   Procedure: COLONOSCOPY WITH PROPOFOL ;  Surgeon: Copping Rogelia, MD;  Location: [facility];  Service: Endoscopy;  Laterality: N/A;   COLOSTOMY REVERSAL N/A 10/28/2014   Procedure: COLOSTOMY REVERSAL;  Surgeon: Lonni Brands, MD;  Location: [facility];  Service: General;  Laterality: N/A;   ESOPHAGOGASTRODUODENOSCOPY (EGD) WITH PROPOFOL  N/A 01/21/2019   Procedure: ESOPHAGOGASTRODUODENOSCOPY  (EGD) WITH PROPOFOL ;  Surgeon: Copping Rogelia, MD;  Location: [facility];  Service: Endoscopy;  Laterality: N/A;   FEMUR FRACTURE SURGERY Right    FRACTURE SURGERY     LEG SURGERY     LYSIS OF ADHESION  10/28/2014   Procedure: LYSIS OF ADHESION;  Surgeon: Lonni Brands, MD;  Location: [facility] OR[facility];  Service: General;;   PORT A CATH INJECTION ([facility] HX) Right    TUNNELED VENOUS PORT PLACEMENT      There were no vitals filed for this visit.      Josh Borders NP  REASON FOR CONSULTATION: James Keller is a 74 y.o. 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  Patient has had pain and was referred to palliative care to address goals of manage ongoing symptoms.   SOCIAL HISTORY:     reports that he has been smoking cigarettes. He has a 27 pack-year smoking history. He has never used smokeless tobacco. He reports current alcohol use of about 6.0 standard drinks of alcohol per week. He reports current drug use. Drug: Marijuana.   Patient is married lives at home with his wife.  They have 1 son who is currently incarcerated.  Patient previously worked doing tree care.      IMPRESSION: Follow-up visit.  Patient accompanied by wife.   Patient reports he is doing about the same.  Pain is tolerable on 10 mg oxycodone .  Will  continue that regimen for now.  Can consider initiation of a long-acting opioid if needed.   PET scan showed mixed response to treatment.  Discussed with Dr. Jacobo and the patient being referred for consideration of XRT.   PLAN: - Continue current scope of treatment - Continue oxycodone  - Daily bowel regimen of MiraLAX /senna - Continue mirtazapine  7.5 mg nightly - RTC 3 to 4 weeks       OT SCREEN 02/13/24 Patient present with wife at OT evaluation. Patient ambulates with a limp.  Favoring right hip. Reports pain can be 8/10 at the right hip down to the knee and lower back-as well as lower  abdomen. Patient reports he did take pain medicine before coming in. 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  Per wife spending a lot of time in bed. Does interact and play with his 76-year-old grandson coming around Upon assessment patient limited by right hip pain with tandem stand, 1 leg stand, bending forward to pick up something, alternating tapping foot.  Patient is tall and had some effort to do sit<> stand without hands but could do it - more ease with pillow in seat. BERG balance test 44/56 -low risk for falling But reinforced with patient importance of staying active 150 minutes a week breaking up and short sessions a day to maintain his strength and balance and decrease fall risk. Provided patient with home program -Sit to stand 3 times a day 5-10 reps -Walking up and down the hallway or with family to the mailbox -Tandem stand in the hallway holding onto the walls both sides -Walking sideways holding onto the kitchen counter or hallway wall 1 to 2 minutes -Heel raises 10 reps  Patient wants to do home program for about a 4 to 6 weeks and follow-up will reassess and assess possible referral to CARE program                            Visit Diagnosis: Muscle weakness (generalized)    Problem List Patient Active Problem List   Diagnosis Date Noted   Genetic testing 09/25/2023   Prostate cancer metastatic to bone (HCC) 08/30/2023   Iron deficiency anemia due to chronic blood loss    Acute gastritis without hemorrhage    Polyp of transverse colon    Right kidney mass 05/08/2016   Incisional hernia, without obstruction or gangrene 12/27/2015   Benign neoplasm of cecum    Benign neoplasm of ascending colon    Adenomatous polyp of sigmoid colon    Cancer of descending colon metastatic to intra-abdominal lymph node (HCC) 07/28/2015   Atrial  fibrillation with rapid ventricular response (HCC) 11/03/2014   Hypertensive left ventricular hypertrophy 10/21/2014   Paroxysmal atrial fibrillation (HCC) 10/21/2014   Breathlessness on exertion 10/21/2014   Personal history of colon cancer    Benign neoplasm of sigmoid colon    Benign neoplasm of transverse colon    DVT (deep venous thrombosis) (HCC)    Bradycardia 02/02/2014   A-fib (HCC) 01/30/2014   Essential (primary) hypertension 01/30/2014    Ancel Peters, OTR/L,CLT 02/13/2024, 3:37 PM  [facility] - A Dept Of [place]. [facility] [address], [address] [place], [place], [address] Phone: [phone]   Fax:  [phone]  Name: James Keller MRN: [id number] Date of  Birth: 06/14/1950

## 2024-02-14 ENCOUNTER — Inpatient Hospital Stay

## 2024-02-14 ENCOUNTER — Other Ambulatory Visit: Payer: Self-pay

## 2024-02-14 NOTE — Progress Notes (Signed)
 Nutrition Assessment   Reason for Assessment:   Poor appetite, weight loss   ASSESSMENT:  74 year old male with stage IV prostate cancer.  Past medical history of colon cancer 2015, DVT, HTN, colostomy reversal 2016, smoker and Etoh use.  Planing eligard , zytiga , zometa .  Met with patient and significant other [place].  Reports that he usually eats breakfast meat and egg with oatmeal and coffee for breakfast.  Sometimes will eat sandwich around 1pm and drink Koolaid.  Supper last night was pinto beans, meatloaf and salad.  Drinks juice and some times water.  Does not have any teeth.  Drinks boost shakes and equate shakes 2-3 times a day.  Reports constipation.  Has been taking 2 pills of senna and 1 capful of miralax .      Nutrition Focused Physical Exam:   Orbital Region: normal Buccal Region: normal Upper Arm Region: mild Thoracic and Lumbar Region: normal Temple Region: normal Clavicle Bone Region: normal Shoulder and Acromion Bone Region: normal Scapular Bone Region: normal Dorsal Hand: normal Patellar Region: normal Anterior Thigh Region: unable to observe Posterior Calf Region: normal Edema (RD assessment): none Hair: observed Eyes: observed Mouth: observed Skin: observed, dry Nails: observed, long   Medications: remeron , prednisone , zytiga    Labs: reviewed   Anthropometrics:   Height: 73 inches Weight: 192 lb 3 oz  197 lb on 11/20 212 lb on 7/17 BMI: 24  9% weight loss in the last 6 months   Estimated Energy Needs  Kcals: 2175-2600 Protein: 87-104 g Fluid: 2175-2643ml   NUTRITION DIAGNOSIS: Unintentional weight loss related to cancer as evidenced by 9% weight loss in the last 6 months   INTERVENTION:  Continue oral nutrition supplement, 350 calorie option or higher.  Examples written for patient.  Discussed ways to add calories and protein in diet.  Handout provided Contact information provided   MONITORING, EVALUATION, GOAL: weight trends,  intake   Next Visit: Tuesday, Feb 17 in clinic  Dimples Probus B. Dasie SOLON, CSO, LDN Registered Dietitian [phone]

## 2024-02-20 ENCOUNTER — Ambulatory Visit
Admission: RE | Admit: 2024-02-20 | Discharge: 2024-02-20 | Disposition: A | Discharge status: Home / Self Care | Source: Ambulatory Visit | Attending: Radiation Oncology | Admitting: Radiation Oncology

## 2024-02-20 VITALS — Resp 15 | Ht 73.0 in | Wt 197.0 lb

## 2024-02-20 DIAGNOSIS — Z7952 Long term (current) use of systemic steroids: Secondary | ICD-10-CM | POA: Insufficient documentation

## 2024-02-20 DIAGNOSIS — F17210 Nicotine dependence, cigarettes, uncomplicated: Secondary | ICD-10-CM | POA: Insufficient documentation

## 2024-02-20 DIAGNOSIS — Z79899 Other long term (current) drug therapy: Secondary | ICD-10-CM | POA: Insufficient documentation

## 2024-02-20 DIAGNOSIS — C61 Malignant neoplasm of prostate: Secondary | ICD-10-CM | POA: Insufficient documentation

## 2024-02-20 DIAGNOSIS — Z86718 Personal history of other venous thrombosis and embolism: Secondary | ICD-10-CM | POA: Insufficient documentation

## 2024-02-20 DIAGNOSIS — I480 Paroxysmal atrial fibrillation: Secondary | ICD-10-CM | POA: Insufficient documentation

## 2024-02-20 DIAGNOSIS — Z9221 Personal history of antineoplastic chemotherapy: Secondary | ICD-10-CM | POA: Insufficient documentation

## 2024-02-20 DIAGNOSIS — Z85038 Personal history of other malignant neoplasm of large intestine: Secondary | ICD-10-CM | POA: Insufficient documentation

## 2024-02-20 DIAGNOSIS — I10 Essential (primary) hypertension: Secondary | ICD-10-CM | POA: Insufficient documentation

## 2024-02-20 DIAGNOSIS — Z7901 Long term (current) use of anticoagulants: Secondary | ICD-10-CM | POA: Insufficient documentation

## 2024-02-20 DIAGNOSIS — C7951 Secondary malignant neoplasm of bone: Secondary | ICD-10-CM | POA: Insufficient documentation

## 2024-02-20 NOTE — Consult Note (Signed)
 " NEW PATIENT EVALUATION  Name: James Keller  MRN: [id number]  Date:   02/20/2024     DOB: 1950-06-06   This 74 y.o. male patient presents to the clinic for initial evaluation of metastatic disease and patient with known stage IV prostate cancer with extreme low back pelvic pain.  REFERRING PHYSICIAN: Jacobo Evalene JINNY, MD  CHIEF COMPLAINT:  Chief Complaint  Patient presents with   Prostate Cancer    DIAGNOSIS: The encounter diagnosis was Secondary malignant neoplasm of bone (HCC).   PREVIOUS INVESTIGATIONS:  PSMA PET scan reviewed Clinical notes reviewed Path reviewed  HPI: Patient is a 74 year old male with stage IVb adenocarcinoma the prostate with widespread axial metastasis.  He is currently under medical oncology's care with Eligard  and Zometa .  He also continues Zytiga  and prednisone .  He has been having severe pain in his lower back radiating around to his abdomen.  Widespread bone metastasis including extensive disease in his pelvis L-spine and acetabulum were noted on recent PSMA PET scan.  Plain films of his pelvis also showed extensive sclerotic lesions throughout the bony pelvis and proximal femurs compatible with metastatic disease.  He is ambulating although uses a cane with some difficulty.  He is seen today for consideration of palliative radiation.  PLANNED TREATMENT REGIMEN: Palliative radiation therapy to L-spine pelvis  PAST MEDICAL HISTORY:  has a past medical history of Colon cancer (HCC), DVT (deep venous thrombosis) (HCC), DVT (deep venous thrombosis) (HCC), Dysrhythmia, Hypertension, Lower extremity edema, Neuromuscular disorder (HCC), PAF (paroxysmal atrial fibrillation) (HCC), S/P chemotherapy, time since less than 4 weeks, and Tobacco abuse.    PAST SURGICAL HISTORY:  Past Surgical History:  Procedure Laterality Date   COLON RESECTION  12/19/2013   colostomy   COLON SURGERY     COLONOSCOPY WITH PROPOFOL  N/A 09/14/2014   Procedure: COLONOSCOPY WITH  PROPOFOL  THROUGH COLOSTOMY AND COLONOSCOPY THRU RECTUM;  Surgeon: Rogelia Copping, MD;  Location: [facility];  Service: Endoscopy;  Laterality: N/A;  TRANSVERSE COLON POLYP AND SIGMOID COLON POLYP   COLONOSCOPY WITH PROPOFOL  N/A 11/29/2015   Procedure: COLONOSCOPY WITH PROPOFOL ;  Surgeon: Ruel Kung, MD;  Location: [facility];  Service: Endoscopy;  Laterality: N/A;   COLONOSCOPY WITH PROPOFOL  N/A 01/21/2019   Procedure: COLONOSCOPY WITH PROPOFOL ;  Surgeon: Copping Rogelia, MD;  [facility];  Service: Endoscopy;  Laterality: N/A;   COLOSTOMY REVERSAL N/A 10/28/2014   Procedure: COLOSTOMY REVERSAL;  Surgeon: Lonni Brands, MD;  Location: [facility] ORS;  Service: General;  Laterality: N/A;   ESOPHAGOGASTRODUODENOSCOPY (EGD) WITH PROPOFOL  N/A 01/21/2019   Procedure: ESOPHAGOGASTRODUODENOSCOPY (EGD) WITH PROPOFOL ;  Surgeon: Copping Rogelia, MD;  Location: [facility];  Service: Endoscopy;  Laterality: N/A;   FEMUR FRACTURE SURGERY Right    FRACTURE SURGERY     LEG SURGERY     LYSIS OF ADHESION  10/28/2014   Procedure: LYSIS OF ADHESION;  Surgeon: Lonni Brands, MD;  Location: [facility];  Service: General;;   PORT A CATH INJECTION ([facility] HX) Right    TUNNELED VENOUS PORT PLACEMENT      FAMILY HISTORY: family history includes Hypertension in his mother; Liver cancer in his paternal grandfather.  SOCIAL HISTORY:  reports that he has been smoking cigarettes. He has a 27 pack-year smoking history. He has never used smokeless tobacco. He reports current alcohol use of about 6.0 standard drinks of alcohol per week. He reports current drug use. Drug: Marijuana.  ALLERGIES: Patient has no known allergies.  MEDICATIONS:  Current Outpatient Medications  Medication Sig Dispense Refill   abiraterone  acetate (ZYTIGA ) 250 MG tablet Take 4 tablets (1,000 mg total) by mouth daily. Take on an empty stomach 1 hour before or 2 hours after a meal 120 tablet 2   acetaminophen  (TYLENOL ) 500  MG tablet Take 500 mg by mouth every 6 (six) hours as needed.     amiodarone  (PACERONE ) 200 MG tablet Take 200 mg by mouth daily.      amLODipine (NORVASC) 10 MG tablet Take 1 tablet by mouth daily.     atorvastatin (LIPITOR) 40 MG tablet Take 40 mg by mouth daily.     gabapentin  (NEURONTIN ) 300 MG capsule Take 1 capsule (300 mg total) by mouth 3 (three) times daily. (Patient not taking: Reported on 02/11/2024) 90 capsule 1   lisinopril (ZESTRIL) 40 MG tablet Take 40 mg by mouth daily.     metoprolol  tartrate (LOPRESSOR ) 25 MG tablet Take 25 mg by mouth 2 (two) times daily. Am and pm     mirtazapine  (REMERON ) 7.5 MG tablet Take 1 tablet (7.5 mg total) by mouth at bedtime. 30 tablet 0   Oxycodone  HCl 10 MG TABS Take 1 tablet (10 mg total) by mouth every 4 (four) hours as needed (pain). (Patient not taking: Reported on 02/11/2024) 60 tablet 0   predniSONE  (DELTASONE ) 5 MG tablet Take 1 tablet (5 mg total) by mouth daily with breakfast. 30 tablet 2   rivaroxaban (XARELTO) 20 MG TABS tablet Take 20 mg by mouth daily with supper.     No current facility-administered medications for this encounter.   Facility-Administered Medications Ordered in Other Encounters  Medication Dose Route Frequency Provider Last Rate Last Admin   heparin  lock flush 100 unit/mL  500 Units Intracatheter PRN Jacobo Evalene PARAS, MD       sodium chloride  0.9 % injection 10 mL  10 mL Intracatheter PRN Finnegan, Timothy J, MD   10 mL at 10/19/14 1353   sodium chloride  flush (NS) 0.9 % injection 10 mL  10 mL Intracatheter PRN Jacobo Evalene PARAS, MD       sodium chloride  flush (NS) 0.9 % injection 10 mL  10 mL Intravenous PRN Finnegan, Timothy J, MD   10 mL at 08/13/17 1442    ECOG PERFORMANCE STATUS:  1 - Symptomatic but completely ambulatory  REVIEW OF SYSTEMS: Patient denies any weight loss, fatigue, weakness, fever, chills or night sweats. Patient denies any loss of vision, blurred vision. Patient denies any ringing  of the  ears or hearing loss. No irregular heartbeat. Patient denies heart murmur or history of fainting. Patient denies any chest pain or pain radiating to her upper extremities. Patient denies any shortness of breath, difficulty breathing at night, cough or hemoptysis. Patient denies any swelling in the lower legs. Patient denies any nausea vomiting, vomiting of blood, or coffee ground material in the vomitus. Patient denies any stomach pain. Patient states has had normal bowel movements no significant constipation or diarrhea. Patient denies any dysuria, hematuria or significant nocturia. Patient denies any problems walking, swelling in the joints or loss of balance. Patient denies any skin changes, loss of hair or loss of weight. Patient denies any excessive worrying or anxiety or significant depression. Patient denies any problems with insomnia. Patient denies excessive thirst, polyuria, polydipsia. Patient denies any swollen glands, patient denies easy bruising or easy bleeding. Patient denies any recent infections, allergies or URI. Patient s visual fields have not changed significantly in recent time.   PHYSICAL EXAM: Resp 15  Ht 6' 1 (1.854 m)   Wt 197 lb (89.4 kg)   BMI 25.99 kg/m  Range of motion of the lower extremities does not elicit pain except some pain elicited on his range of motion in his left hip region.  Motor and sensory in D2 levels are equal symmetric in the lower extremities deep palpation of his lumbar spine does not elicit pain.  Well-developed well-nourished patient in NAD. HEENT reveals PERLA, EOMI, discs not visualized.  Oral cavity is clear. No oral mucosal lesions are identified. Neck is clear without evidence of cervical or supraclavicular adenopathy. Lungs are clear to A&P. Cardiac examination is essentially unremarkable with regular rate and rhythm without murmur rub or thrill. Abdomen is benign with no organomegaly or masses noted. Motor sensory and DTR levels are equal and  symmetric in the upper and lower extremities. Cranial nerves II through XII are grossly intact. Proprioception is intact. No peripheral adenopathy or edema is identified. No motor or sensory levels are noted. Crude visual fields are within normal range.  LABORATORY DATA: Labs and path reviewed    RADIOLOGY RESULTS: PSMA PET scan reviewed   IMPRESSION: Widespread metastatic disease in patient with known stage IVb prostate cancer in 74 year old male with significant pain in his lower back and pelvis  PLAN: This time elected Compass is much metastatic disease by PSMA PET scan and is lumbar spine and pelvis.  Will plan on delivering 30 Gray in 10 fractions.  I will use a hybrid technique to spare as much small bowel and bladder as possible.  Risks and benefits of treatment occluding possible increased lower Neri tract symptoms possible diarrhea fatigue alteration blood counts and skin reaction all were reviewed in detail with the patient and his wife.  I have personally step and ordered CT simulation for tomorrow.  They patient and his wife both comprehend my treatment plan well.  I would like to take this opportunity to thank you for allowing me to participate in the care of your patient.SABRA Marcey Penton, MD         "

## 2024-02-21 ENCOUNTER — Other Ambulatory Visit: Payer: Self-pay | Admitting: *Deleted

## 2024-02-21 ENCOUNTER — Ambulatory Visit
Admission: RE | Admit: 2024-02-21 | Discharge: 2024-02-21 | Disposition: A | Discharge status: Home / Self Care | Source: Ambulatory Visit | Attending: Radiation Oncology | Admitting: Radiation Oncology

## 2024-02-21 DIAGNOSIS — C61 Malignant neoplasm of prostate: Secondary | ICD-10-CM | POA: Diagnosis present

## 2024-02-21 DIAGNOSIS — C7951 Secondary malignant neoplasm of bone: Secondary | ICD-10-CM | POA: Insufficient documentation

## 2024-02-21 MED ORDER — OXYCODONE HCL 10 MG PO TABS: 10.0000 mg | ORAL_TABLET | ORAL | 0 refills | Status: AC | PRN

## 2024-02-21 NOTE — Telephone Encounter (Signed)
 Notified pt that script had been filled.

## 2024-02-21 NOTE — Telephone Encounter (Signed)
 Patient called and requested RF for oxycodone . Script pended.

## 2024-02-23 DIAGNOSIS — C7951 Secondary malignant neoplasm of bone: Secondary | ICD-10-CM | POA: Diagnosis not present

## 2024-02-25 ENCOUNTER — Ambulatory Visit

## 2024-02-26 ENCOUNTER — Other Ambulatory Visit: Payer: Self-pay

## 2024-02-26 ENCOUNTER — Ambulatory Visit

## 2024-02-26 ENCOUNTER — Ambulatory Visit
Admission: RE | Admit: 2024-02-26 | Discharge: 2024-02-26 | Attending: Radiation Oncology | Admitting: Radiation Oncology

## 2024-02-26 LAB — RAD ONC ARIA SESSION SUMMARY
Course Elapsed Days: 0
Plan Fractions Treated to Date: 1
Plan Prescribed Dose Per Fraction: 3 Gy
Plan Total Fractions Prescribed: 10
Plan Total Prescribed Dose: 30 Gy
Reference Point Dosage Given to Date: 3 Gy
Reference Point Session Dosage Given: 3 Gy
Session Number: 1

## 2024-02-27 ENCOUNTER — Other Ambulatory Visit: Payer: Self-pay

## 2024-02-27 ENCOUNTER — Ambulatory Visit
Admission: RE | Admit: 2024-02-27 | Discharge: 2024-02-27 | Disposition: A | Source: Ambulatory Visit | Attending: Radiation Oncology | Admitting: Radiation Oncology

## 2024-02-27 LAB — RAD ONC ARIA SESSION SUMMARY
Course Elapsed Days: 1
Plan Fractions Treated to Date: 2
Plan Prescribed Dose Per Fraction: 3 Gy
Plan Total Fractions Prescribed: 10
Plan Total Prescribed Dose: 30 Gy
Reference Point Dosage Given to Date: 6 Gy
Reference Point Session Dosage Given: 3 Gy
Session Number: 2

## 2024-02-28 ENCOUNTER — Ambulatory Visit
Admission: RE | Admit: 2024-02-28 | Discharge: 2024-02-28 | Disposition: A | Source: Ambulatory Visit | Attending: Radiation Oncology | Admitting: Radiation Oncology

## 2024-02-28 ENCOUNTER — Other Ambulatory Visit: Payer: Self-pay

## 2024-02-28 LAB — RAD ONC ARIA SESSION SUMMARY
Course Elapsed Days: 2
Plan Fractions Treated to Date: 3
Plan Prescribed Dose Per Fraction: 3 Gy
Plan Total Fractions Prescribed: 10
Plan Total Prescribed Dose: 30 Gy
Reference Point Dosage Given to Date: 9 Gy
Reference Point Session Dosage Given: 3 Gy
Session Number: 3

## 2024-02-29 ENCOUNTER — Inpatient Hospital Stay: Attending: Oncology | Admitting: Hospice and Palliative Medicine

## 2024-02-29 ENCOUNTER — Other Ambulatory Visit: Payer: Self-pay

## 2024-02-29 ENCOUNTER — Telehealth: Payer: Self-pay

## 2024-02-29 ENCOUNTER — Inpatient Hospital Stay

## 2024-02-29 ENCOUNTER — Ambulatory Visit: Admission: RE | Admit: 2024-02-29 | Source: Ambulatory Visit

## 2024-02-29 ENCOUNTER — Other Ambulatory Visit: Payer: Self-pay | Admitting: *Deleted

## 2024-02-29 VITALS — BP 121/57 | HR 101 | Resp 18 | Wt 190.0 lb

## 2024-02-29 DIAGNOSIS — C61 Malignant neoplasm of prostate: Secondary | ICD-10-CM

## 2024-02-29 DIAGNOSIS — D649 Anemia, unspecified: Secondary | ICD-10-CM

## 2024-02-29 DIAGNOSIS — R300 Dysuria: Secondary | ICD-10-CM

## 2024-02-29 LAB — URINALYSIS, COMPLETE (UACMP) WITH MICROSCOPIC
Bilirubin Urine: NEGATIVE
Glucose, UA: NEGATIVE mg/dL
Hgb urine dipstick: NEGATIVE
Ketones, ur: NEGATIVE mg/dL
Nitrite: NEGATIVE
Protein, ur: 30 mg/dL — AB
Specific Gravity, Urine: 1.023 (ref 1.005–1.030)
Squamous Epithelial / HPF: 0 /HPF (ref 0–5)
pH: 5 (ref 5.0–8.0)

## 2024-02-29 LAB — CMP (CANCER CENTER ONLY)
ALT: 18 U/L (ref 0–44)
AST: 87 U/L — ABNORMAL HIGH (ref 15–41)
Albumin: 3.2 g/dL — ABNORMAL LOW (ref 3.5–5.0)
Alkaline Phosphatase: 219 U/L — ABNORMAL HIGH (ref 38–126)
Anion gap: 15 (ref 5–15)
BUN: 28 mg/dL — ABNORMAL HIGH (ref 8–23)
CO2: 19 mmol/L — ABNORMAL LOW (ref 22–32)
Calcium: 9 mg/dL (ref 8.9–10.3)
Chloride: 107 mmol/L (ref 98–111)
Creatinine: 1.51 mg/dL — ABNORMAL HIGH (ref 0.61–1.24)
GFR, Estimated: 49 mL/min — ABNORMAL LOW
Glucose, Bld: 117 mg/dL — ABNORMAL HIGH (ref 70–99)
Potassium: 4.6 mmol/L (ref 3.5–5.1)
Sodium: 141 mmol/L (ref 135–145)
Total Bilirubin: 0.5 mg/dL (ref 0.0–1.2)
Total Protein: 6.6 g/dL (ref 6.5–8.1)

## 2024-02-29 LAB — CBC WITH DIFFERENTIAL (CANCER CENTER ONLY)
Abs Immature Granulocytes: 0.07 10*3/uL (ref 0.00–0.07)
Basophils Absolute: 0 10*3/uL (ref 0.0–0.1)
Basophils Relative: 0 %
Eosinophils Absolute: 0 10*3/uL (ref 0.0–0.5)
Eosinophils Relative: 0 %
HCT: 23.5 % — ABNORMAL LOW (ref 39.0–52.0)
Hemoglobin: 7.1 g/dL — ABNORMAL LOW (ref 13.0–17.0)
Immature Granulocytes: 1 %
Lymphocytes Relative: 6 %
Lymphs Abs: 0.5 10*3/uL — ABNORMAL LOW (ref 0.7–4.0)
MCH: 26.2 pg (ref 26.0–34.0)
MCHC: 30.2 g/dL (ref 30.0–36.0)
MCV: 86.7 fL (ref 80.0–100.0)
Monocytes Absolute: 0.5 10*3/uL (ref 0.1–1.0)
Monocytes Relative: 5 %
Neutro Abs: 7.7 10*3/uL (ref 1.7–7.7)
Neutrophils Relative %: 88 %
Platelet Count: 390 10*3/uL (ref 150–400)
RBC: 2.71 MIL/uL — ABNORMAL LOW (ref 4.22–5.81)
RDW: 17.7 % — ABNORMAL HIGH (ref 11.5–15.5)
WBC Count: 8.8 10*3/uL (ref 4.0–10.5)
nRBC: 0.3 % — ABNORMAL HIGH (ref 0.0–0.2)

## 2024-02-29 LAB — RAD ONC ARIA SESSION SUMMARY
Course Elapsed Days: 3
Plan Fractions Treated to Date: 4
Plan Prescribed Dose Per Fraction: 3 Gy
Plan Total Fractions Prescribed: 10
Plan Total Prescribed Dose: 30 Gy
Reference Point Dosage Given to Date: 12 Gy
Reference Point Session Dosage Given: 3 Gy
Session Number: 4

## 2024-02-29 LAB — FERRITIN: Ferritin: 1729 ng/mL — ABNORMAL HIGH (ref 24–336)

## 2024-02-29 LAB — TYPE AND SCREEN
ABO/RH(D): B POS
Antibody Screen: NEGATIVE
Unit division: 0

## 2024-02-29 LAB — IRON AND TIBC
Iron: 20 ug/dL — ABNORMAL LOW (ref 45–182)
Saturation Ratios: 13 % — ABNORMAL LOW (ref 17.9–39.5)
TIBC: 146 ug/dL — ABNORMAL LOW (ref 250–450)
UIBC: 126 ug/dL

## 2024-02-29 LAB — BPAM RBC
Blood Product Expiration Date: 202603062359
Unit Type and Rh: 7300

## 2024-02-29 LAB — PREPARE RBC (CROSSMATCH)

## 2024-02-29 LAB — ABO/RH: ABO/RH(D): B POS

## 2024-02-29 MED ORDER — CEPHALEXIN 500 MG PO CAPS: 500.0000 mg | ORAL_CAPSULE | Freq: Two times a day (BID) | ORAL | 0 refills | Status: AC

## 2024-02-29 MED ORDER — TAMSULOSIN HCL 0.4 MG PO CAPS: 0.4000 mg | ORAL_CAPSULE | Freq: Every day | ORAL | 1 refills | Status: AC

## 2024-02-29 MED ORDER — NALOXONE HCL 4 MG/0.1ML NA LIQD: NASAL | 0 refills | Status: AC

## 2024-02-29 MED ORDER — MORPHINE SULFATE ER 15 MG PO TBCR: 15.0000 mg | EXTENDED_RELEASE_TABLET | Freq: Two times a day (BID) | ORAL | 0 refills | Status: AC

## 2024-02-29 NOTE — Telephone Encounter (Signed)
 "  NURSING SYMPTOM TRIAGE ASSESSMENT & DISPOSITION  Mode of interaction:  Walk-in Date of symptom triage interaction:  02/29/24 Time of symptom triage interaction:  [id number]  Cancer diagnosis:  Prostate cancer metastatic to bone [facility]) Current anti-cancer treatment: Last treatment date:  radiation therapy today.  Oral chemotherapy:  Yes, prescription for Zytiga  +Prednisone  with last dose taken yesterday.  Symptom Triage Protocol:  Dysuria  History of the problem:  Dysuria Fluid intake over past 24 hours: 4-5 20 ounces bottle of water per day;  Patient drinks water at night. Not as much during the day  Having gastrointestinal complaints?  No  Experiencing lightheadedness or dizziness?  No  Pregnancy status:  N/a  Pain on urination, before starting a stream and after?  No  Frequency of urination over 48 hours:  Every 2 hours  Having sensation bladder not emptying completely?  Yes: often just stands there unable to go.  Last urination:  This morning.  Color of urine:  Darker color urine  Sediment present in urine:  No  Odor of urine:  Foul odor  Discharge from urinary structures?  No  Fevers or chills?  No  Precipitating factors:  Radiation therapy to pelvic wall- prostate cancer; h/o UTI's  Onset:   Last 24-48 hours  Duration:     Relieving factors:     Smoking or alcohol intake?  Yes: cigarettes.- 1-2 cigs a day  Ability to start and end stream?  No incontinence  Lower back pain?  Yes: see pain assessment  Associated symptoms:  Urinary retention;inability to void     Pertinent details: - Cancer history - Recent urinary structure procedures - UTI, sequelae if known - Sexual history Prostate cancer     Changes in ADLs:   Yes: see pain below.     Additional narrative:   Also using pedialyte and water to maintain hydration   Triage Nurse Guidance Signs and Symptoms Action  Urinary retention Acute severe flank or back pain Lower extremity weakness Temperature higher than  101.5 F without suspected neutropenia Temperature higher than 100.4 F with suspected neutropenia Chills, malaise, rigors Seek emergency care.  Call an ambulance immediately.   Hematuria Dysuria Burning with urination Frequent urination, nocturia Cloudy or malodorous urine or discharge Suprapubic pain or tenderness Unable to urinate for more than eight (8) hours Flu-like symptoms for longer than 72 hours Joint pain, cough, or rash after bacillus James Keller treatment Seek urgent care within 24 hours.   If the patient has received recent bacillus Calmette-Keller treatment Dysuria, burning, or difficulty urinating Frequent urination, nocturia Sense of incomplete voiding Slow stream, dribbling Follow homecare instructions.  Notify MD if no improvement.     Patient Instruction  Disclaimer:  Patient specific and Else[employer] instruction provided verbally and sent through MyChart for active users.    Drink 8-10 eight-ounce glasses of fluid each day (unless contraindicated).  Alcohol is not included in hydration amounts; remember to report consumption to clinician. Cranberry juice may reduce bacterial adherence to bladder wall.  Avoid caffeinated and acidic beverages, as they can be irritants or stimulants.  Females should cleanse the genital area from front to back.  Avoid urinary stasis by urinating frequently.  Postcoital urination is suggested.  Take showers instead of tub baths to decrease infection risk.  Monitor urinary output.  Practice pelvic floor exercises.  Use of a voiding diary may be helpful.  Seek Emergency Care Immediately if Any of the Following Occurs: Shortness of breath or difficulty breathing  Loss of consciousness, unresponsiveness, or changes in cognition  Temperature elevation that persists 48-72 hours after initiation of treatment  Development of fever, chills, or rigors  Inability to pass urine  Teach Back Method used:  Yes    History of the problem:   Pain Pain Rating (0-10): Currently 9/10, on average 8/10, at worst 10/10, at best 7/10  Pain Location: legs  Pain Quality: Sharp and Radiating to lower back  Associated Pain Symptoms: None  When Did The Pain Start: Leg pain has been ongoing since November 2025  Is the Pain Constant: Yes  Does the Pain Flare:   No  Does Anything Make the Pain Worse: Yes, moving and getting around  Does Anything Make the Pain Better:  Yes, oxycodone , but pt often has to take 2 tablets every 4 hours. Original script written for 1 tablet every 4 hours. Patient took about 11am today. Patient currently rating pain at 9/10     Assess for Changes in ADLs: Occasional needs assistance with ADLS when pain is increasing.     In the past, what treatments have you tried for your pain: Tylenol  500 mgs. Occasional take 1-2 tablets every 4 hours as needed for pain. They understand the max amt 4,000 of Tylenol /24hrs  Did these treatments help: Yes  Additional commentary:     Triage Nurse Guidance Severity Index  Seek emergency care, call an ambulance immediately for: Signs/symptoms of acute injury, spinal cord compression, pathologic fracture, infection, or other life threatening problem Sudden onset of severe weakness or unrelenting localized pain Inability to ambulate or decreased sensation in extremities Loss of control of bowel or bladder Chest pain  Seek medical care within two to four hours for: Sudden onset of moderate to severe pain Pain not responsive to current medication regimen Pain that interferes with mobility  Seek medical care within twenty-four hours for: Mild to moderate pain that has been increasing Pain that is interfering with activity or sleep  Follow home care instructions for: Mild to moderate aches and pains Notify the provider if no improvement within twenty-four hours    . Patient Instruction  Disclaimer:  Patient specific and Else[employer] instruction provided verbally and sent through  MyChart for active users.    Report the following problems No improvement in pain Pain that does not subside with interventions Other side effects, such as sedation, nausea, or constipation  Seek emergency care immediately if any of the following occur: Excruciating pain Immobility Low back pain associated with loss of bladder or bowel control; bilateral extremity weakness  Teach Back Method used:  Yes     Provider Consulted  Provider name and credentials: Calista [facility] provider  Provider instruction: Sidra, NP ok to see patient. Labs/ smc      Nurse Triage Priority:  Urgent (obtain medical orders as indicated with instruction for 8-24 hour or sooner follow-up)  Barriers to Care:  None identified  Nurse Triage Disposition:  In-person follow-up visit:  patient in clinic in radiation therapy dept. Agreeable to be added on smc today and labs.  Protocol Source:  Ruthine HERO., & Eldonna CANDIE Pee.). (2019). Telephone triage for oncology nurses (3rd ed.). Oncology Nursing Society.  "

## 2024-02-29 NOTE — Telephone Encounter (Signed)
 Referral placed for GI and Urology per [facility]

## 2024-02-29 NOTE — Progress Notes (Signed)
 "  Symptom Management Clinic [facility] [phone] [phone]  Patient Care Team: Center, [facility] as PCP - General Jinny Carmine, MD as Consulting Physician (Gastroenterology) Jacobo Evalene PARAS, MD as Consulting Physician (Oncology) Lenn Aran, MD as Consulting Physician (Radiation Oncology)   NAME OF PATIENT: James Keller  [id number]  09/10/1950   DATE OF VISIT: 02/29/24  REASON FOR CONSULT: James Keller is a 74 y.o. 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    INTERVAL HISTORY: Patient was referred from radiation oncology due to severe pain and difficulty voiding.  Patient endorses worse generalized pain over the past several days.  He is taking oxycodone  10 mg every 4 hours.  Patient notes that sometimes he takes oxycodone  20 mg.  Reports tolerating pain medications well, other than mild constipation.  However, continues to rate pain as 8-9 out of 10.  Patient reports that he has had difficulty urinating over the past 24 hours.  Says that he is still able to urinate has difficulty initiating his stream.  Denies dysuria.  No fever or chills.  Denies any neurologic complaints.  Denies any easy bleeding or bruising. Reports fair appetite and denies weight loss. Denies chest pain. Denies any nausea, vomiting, or diarrhea.  Patient offers no further specific complaints today.   PAST MEDICAL HISTORY: Past Medical History:  Diagnosis Date   Colon cancer (HCC)    a. 11/2013 s/p colon resection/Hartmann's procedure;  b. Chemo: FOLFOX completed 07/2014.   DVT (deep venous thrombosis) (HCC)    RIGHT LEG/ HX OF   DVT (deep venous thrombosis) (HCC)    Dysrhythmia    Hypertension    Lower extremity edema    Rt leg   Neuromuscular disorder (HCC)    TINGLING IN FINGERS and feet   PAF (paroxysmal atrial fibrillation) (HCC)    S/P  chemotherapy, time since less than 4 weeks    Tobacco abuse     PAST SURGICAL HISTORY:  Past Surgical History:  Procedure Laterality Date   COLON RESECTION  12/19/2013   colostomy   COLON SURGERY     COLONOSCOPY WITH PROPOFOL  N/A 09/14/2014   Procedure: COLONOSCOPY WITH PROPOFOL  THROUGH COLOSTOMY AND COLONOSCOPY THRU RECTUM;  Surgeon: Carmine Jinny, MD;  Location: [facility];  Service: Endoscopy;  Laterality: N/A;  TRANSVERSE COLON POLYP AND SIGMOID COLON POLYP   COLONOSCOPY WITH PROPOFOL  N/A 11/29/2015   Procedure: COLONOSCOPY WITH PROPOFOL ;  Surgeon: Ruel Kung, MD;  Location: [facility];  Service: Endoscopy;  Laterality: N/A;   COLONOSCOPY WITH PROPOFOL  N/A 01/21/2019   Procedure: COLONOSCOPY WITH PROPOFOL ;  Surgeon: Jinny Carmine, MD;  Location: [facility];  Service: Endoscopy;  Laterality: N/A;   COLOSTOMY REVERSAL N/A 10/28/2014   Procedure: COLOSTOMY REVERSAL;  Surgeon: Lonni Brands, MD;  Location: [facility];  Service: General;  Laterality: N/A;   ESOPHAGOGASTRODUODENOSCOPY (EGD) WITH PROPOFOL  N/A 01/21/2019   Procedure: ESOPHAGOGASTRODUODENOSCOPY (EGD) WITH PROPOFOL ;  Surgeon: Jinny Carmine, MD;  Location: [facility];  Service: Endoscopy;  Laterality: N/A;   FEMUR FRACTURE SURGERY Right    FRACTURE SURGERY     LEG SURGERY     LYSIS OF ADHESION  10/28/2014   Procedure: LYSIS OF ADHESION;  Surgeon: Lonni Brands, MD;  Location: [facility];  Service: General;;   PORT A CATH INJECTION ([facility] HX) Right    TUNNELED VENOUS PORT PLACEMENT      HEMATOLOGY/ONCOLOGY HISTORY:  Oncology History  Prostate  cancer metastatic to bone (HCC)  08/30/2023 Initial Diagnosis   Prostate cancer metastatic to bone (HCC)   08/30/2023 Cancer Staging   Staging form: Prostate, AJCC 8th Edition - Clinical stage from 08/30/2023: Stage IVB (cTX, cN1, cM1b, PSA: 930) - Signed by Jacobo Evalene PARAS, MD on 08/30/2023 Stage prefix: Initial diagnosis Prostate specific antigen (PSA) range:  20 or greater    Genetic Testing   Negative genetic testing. No pathogenic variants identified on the [employer] Cancer[employer]+RNA panel. The report date is 09/22/2023.  The [employer] Cancer[employer]+RNAinsight Panel includes sequencing, rearrangement analysis, and RNA analysis for the following 40 genes: APC, ATM, BAP1, BARD1, BMPR1A, BRCA1, BRCA2, BRIP1, CDH1, CDKN2A, CHEK2, FH, FLCN, MET, MLH1, MSH2, MSH6, MUTYH, NF1, NTHL1, PALB2, PMS2, PTEN, RAD51C, RAD51D, RPS20, SMAD4, STK11, TP53, TSC1, TSC2, and VHL (sequencing and deletion/duplication); AXIN2, HOXB13, MBD4, MSH3, POLD1 and POLE (sequencing only); EPCAM and GREM1 (deletion/duplication only).     ALLERGIES:  has no known allergies.  MEDICATIONS:  Current Outpatient Medications  Medication Sig Dispense Refill   abiraterone  acetate (ZYTIGA ) 250 MG tablet Take 4 tablets (1,000 mg total) by mouth daily. Take on an empty stomach 1 hour before or 2 hours after a meal 120 tablet 2   acetaminophen  (TYLENOL ) 500 MG tablet Take 500 mg by mouth every 6 (six) hours as needed.     amiodarone  (PACERONE ) 200 MG tablet Take 200 mg by mouth daily.      amLODipine (NORVASC) 10 MG tablet Take 1 tablet by mouth daily.     atorvastatin (LIPITOR) 40 MG tablet Take 40 mg by mouth daily.     gabapentin  (NEURONTIN ) 300 MG capsule Take 1 capsule (300 mg total) by mouth 3 (three) times daily. (Patient not taking: Reported on 02/29/2024) 90 capsule 1   lisinopril (ZESTRIL) 40 MG tablet Take 40 mg by mouth daily.     metoprolol  tartrate (LOPRESSOR ) 25 MG tablet Take 25 mg by mouth 2 (two) times daily. Am and pm     mirtazapine  (REMERON ) 7.5 MG tablet Take 1 tablet (7.5 mg total) by mouth at bedtime. 30 tablet 0   Oxycodone  HCl 10 MG TABS Take 1 tablet (10 mg total) by mouth every 4 (four) hours as needed (pain). 60 tablet 0   predniSONE  (DELTASONE ) 5 MG tablet Take 1 tablet (5 mg total) by mouth daily with breakfast. 30 tablet 2   rivaroxaban (XARELTO) 20 MG TABS tablet Take 20 mg  by mouth daily with supper.     No current facility-administered medications for this visit.   Facility-Administered Medications Ordered in Other Visits  Medication Dose Route Frequency Provider Last Rate Last Admin   heparin  lock flush 100 unit/mL  500 Units Intracatheter PRN Jacobo Evalene PARAS, MD       sodium chloride  0.9 % injection 10 mL  10 mL Intracatheter PRN Finnegan, Timothy J, MD   10 mL at 10/19/14 1353   sodium chloride  flush (NS) 0.9 % injection 10 mL  10 mL Intracatheter PRN Jacobo Evalene PARAS, MD       sodium chloride  flush (NS) 0.9 % injection 10 mL  10 mL Intravenous PRN Finnegan, Timothy J, MD   10 mL at 08/13/17 1442    VITAL SIGNS: BP (!) 121/57 (BP Location: Left Arm, Patient Position: Sitting)   Pulse (!) 101   Resp 18   Wt 190 lb (86.2 kg)   BMI 25.07 kg/m  Filed Weights   02/29/24 1338  Weight: 190 lb (86.2 kg)  Estimated body mass index is 25.07 kg/m as calculated from the following:   Height as of 02/20/24: 6' 1 (1.854 m).   Weight as of this encounter: 190 lb (86.2 kg).  LABS: CBC:    Component Value Date/Time   WBC 8.8 02/29/2024 1317   WBC 5.9 02/11/2024 1001   HGB 7.1 (L) 02/29/2024 1317   HGB 13.4 05/12/2014 0856   HCT 23.5 (L) 02/29/2024 1317   HCT 41.0 05/12/2014 0856   PLT 390 02/29/2024 1317   PLT 148 (L) 05/12/2014 0856   MCV 86.7 02/29/2024 1317   MCV 86 05/12/2014 0856   NEUTROABS 7.7 02/29/2024 1317   NEUTROABS 4.2 05/12/2014 0856   LYMPHSABS 0.5 (L) 02/29/2024 1317   LYMPHSABS 1.8 05/12/2014 0856   MONOABS 0.5 02/29/2024 1317   MONOABS 0.5 05/12/2014 0856   EOSABS 0.0 02/29/2024 1317   EOSABS 0.1 05/12/2014 0856   BASOSABS 0.0 02/29/2024 1317   BASOSABS 0.1 05/12/2014 0856   Comprehensive Metabolic Panel:    Component Value Date/Time   NA 140 02/11/2024 1001   NA 139 05/12/2014 0856   K 4.7 02/11/2024 1001   K 3.3 (L) 05/12/2014 0856   CL 107 02/11/2024 1001   CL 105 05/12/2014 0856   CO2 22 02/11/2024 1001    CO2 26 05/12/2014 0856   BUN 22 02/11/2024 1001   BUN 18 05/12/2014 0856   CREATININE 1.38 (H) 02/11/2024 1001   CREATININE 1.58 (H) 05/12/2014 0856   GLUCOSE 111 (H) 02/11/2024 1001   GLUCOSE 143 (H) 05/12/2014 0856   CALCIUM  9.7 02/11/2024 1001   CALCIUM  9.2 05/12/2014 0856   AST 77 (H) 02/11/2024 1001   ALT 36 02/11/2024 1001   ALT 24 05/12/2014 0856   ALKPHOS 206 (H) 02/11/2024 1001   ALKPHOS 94 05/12/2014 0856   BILITOT 0.3 02/11/2024 1001   PROT 7.1 02/11/2024 1001   PROT 7.2 05/12/2014 0856   ALBUMIN 3.4 (L) 02/11/2024 1001   ALBUMIN 4.1 05/12/2014 0856    RADIOGRAPHIC STUDIES: NM PET (PSMA) SKULL TO MID THIGH Result Date: 02/07/2024 EXAM: PROSTATE PET SKULL BASE TO MID THIGHS 01/30/2024 02:17:43 PM TECHNIQUE: RADIOPHARMACEUTICAL: 8.74 mCi F-18 flotufolastaf (Posluma ) injected intravenously. PET imaging was obtained from skull vertex to mid thighs. Computed tomography was used for attenuation correction and localization. Fusion imaging was obtained. COMPARISON: PSMA PET scan dated 08/15/2023. CLINICAL HISTORY: Prostate cancer, residual or recurrent disease suspected; prostate cancer, on active treatment with rising PSA. FINDINGS: PROSTATE AND PROSTATE BED: No focal PSMA activity. LYMPH NODES: Reduction in number and activity of PSMA avid pelvic lymph nodes. For example, a right obturator node measuring 11 mm on image 130, decreased in size from 20 mm and with SUV max equal 14, decreased from SUV max equal 35. A left common iliac node measuring 6 mm on image 117, decreased from 15 mm and with SUV max equal 11.3, decreased from SUV max equal 44. Several of the PSMA avid lymph nodes seen on the comparison exam have resolved completely. No new adenopathy. 2 small foci of PSMA activity localize to prevascular nodes on image 52 with SUV max equal 6.2, compared to SUV max equal 6.0 on prior, for no interval change. No new mediastinal PSMA avid lymph nodes. BONES: Marked increase in PSMA avid  skeletal metastasis with multiple new lesions and increased activity of previously described lesions. Innumerable PSMA avid skeletal metastases involving the proximal femurs, pelvis, entire spine, ribs, and shoulder girdles. The activity of the bones is increased  from prior. For example, activity in the right scapula with SUV max equal 15.7, increased from SUV max equal 10. Activity in the L4 vertebral body with SUV max equal 26, increased from SUV max equal 11. On current exam, every vertebral body is involved with metastatic disease, whereas on the comparison exam, scattered activity throughout the spine was noted. Example lesion at L1 with SUV max equal 30, increased from SUV max equal 19 on prior. Increased volume of PSMA tissue within the right femoral neck with SUV max of 23, compared to a small lesion on prior with SUV max of 10. Similar findings in the left femoral neck. The near entirety of the sacrum is involved now with SUV max equal 29, compared to scattered activity on prior with SUV max equal 15. OTHER FINDINGS: Physiologic activity within the salivary glands, liver, spleen, kidneys, bowel, and urinary bladder. Mixed response therapy. IMPRESSION: 1. Mixed response to therapy. 2. Marked increase in PSMA-avid skeletal metastases with multiple new lesions and increased activity of previously described lesions 3. Decrease in size and PSMA activity of iliac pelvic lymph nodes with no new adenopathy. Electronically signed by: Norleen Boxer MD 02/07/2024 11:23 AM EST RP Workstation: [id number]    PERFORMANCE STATUS (ECOG) : 2 - Symptomatic, <50% confined to bed  Review of Systems Unless otherwise noted, a complete review of systems is negative.  Physical Exam General: Frail appearing Cardiovascular: regular rate and rhythm Pulmonary: clear ant fields Abdomen: soft, nontender, + bowel sounds GU: no suprapubic tenderness Extremities: no edema, no joint deformities Skin: no rashes Neurological:  Weakness but otherwise nonfocal  IMPRESSION/PLAN: Prostate cancer - on Zytiga  and Eligard . He is receiving XRT to lumbar spine and pelvis.   Neoplasm related pain -refill oxycodone .  Start MS Contin  15 mg every 12 hours. PDMP reviewed.   Urinary retention -UA suggestive of infection. Will start on Keflex  500mg  BID x 5 days. However, urinary retention also likely due to radiation treatments. Will also start on Flomax  and refer to urology. ED triggers reviewed in detail.   Anemia -patient has had an acute drop in hemoglobin over the past 2 weeks, down to 7.1 from 9.2.  Patient denies black or tarry stools.  No hematuria.  Will check iron panel and add on blood transfusion for next week.  Referral back to GI given history of colorectal cancer.  Blood on Monday, MD/APP follow up next week.   Patient expressed understanding and was in agreement with this plan. He also understands that He can call clinic at any time with any questions, concerns, or complaints.   Thank you for allowing me to participate in the care of this very pleasant patient.   Time Total: 25 minutes  Visit consisted of counseling and education dealing with the complex and emotionally intense issues of symptom management in the setting of serious illness.Greater than 50%  of this time was spent counseling and coordinating care related to the above assessment and plan.  Signed by: Fonda Mower, PhD, NP-C     "

## 2024-02-29 NOTE — Addendum Note (Signed)
 Addended by: BETHENE ALVIN RAMAN on: 02/29/2024 03:19 PM   Modules accepted: Orders

## 2024-02-29 NOTE — Telephone Encounter (Signed)
 Face to face triage performed. Patient in radiation accompanied by significant other. Reports uncontrolled pain and urinary retention. See triage notes.

## 2024-03-03 ENCOUNTER — Inpatient Hospital Stay

## 2024-03-03 ENCOUNTER — Ambulatory Visit

## 2024-03-03 ENCOUNTER — Inpatient Hospital Stay: Admitting: Oncology

## 2024-03-03 ENCOUNTER — Inpatient Hospital Stay: Admitting: Hospice and Palliative Medicine

## 2024-03-04 ENCOUNTER — Ambulatory Visit

## 2024-03-05 ENCOUNTER — Ambulatory Visit

## 2024-03-06 ENCOUNTER — Ambulatory Visit

## 2024-03-06 ENCOUNTER — Inpatient Hospital Stay

## 2024-03-06 ENCOUNTER — Inpatient Hospital Stay: Admitting: Hospice and Palliative Medicine

## 2024-03-07 ENCOUNTER — Ambulatory Visit

## 2024-03-10 ENCOUNTER — Ambulatory Visit

## 2024-03-11 ENCOUNTER — Inpatient Hospital Stay: Admitting: Hospice and Palliative Medicine

## 2024-03-11 ENCOUNTER — Inpatient Hospital Stay: Admitting: Pharmacist

## 2024-03-11 ENCOUNTER — Inpatient Hospital Stay

## 2024-03-11 ENCOUNTER — Inpatient Hospital Stay: Admitting: Oncology
